# Patient Record
Sex: Female | Born: 1940
Health system: Southern US, Community
[De-identification: ages and names within clinical notes are randomized; demographics above are authoritative.]

## PROBLEM LIST (undated history)

## (undated) DIAGNOSIS — I4891 Unspecified atrial fibrillation: Secondary | ICD-10-CM

## (undated) DIAGNOSIS — T7840XA Allergy, unspecified, initial encounter: Secondary | ICD-10-CM

## (undated) DIAGNOSIS — C50919 Malignant neoplasm of unspecified site of unspecified female breast: Secondary | ICD-10-CM

## (undated) DIAGNOSIS — D126 Benign neoplasm of colon, unspecified: Secondary | ICD-10-CM

## (undated) DIAGNOSIS — K5790 Diverticulosis of intestine, part unspecified, without perforation or abscess without bleeding: Secondary | ICD-10-CM

## (undated) DIAGNOSIS — R05 Cough: Secondary | ICD-10-CM

## (undated) DIAGNOSIS — S42009A Fracture of unspecified part of unspecified clavicle, initial encounter for closed fracture: Secondary | ICD-10-CM

## (undated) DIAGNOSIS — I341 Nonrheumatic mitral (valve) prolapse: Secondary | ICD-10-CM

## (undated) DIAGNOSIS — E785 Hyperlipidemia, unspecified: Secondary | ICD-10-CM

## (undated) DIAGNOSIS — M810 Age-related osteoporosis without current pathological fracture: Secondary | ICD-10-CM

## (undated) DIAGNOSIS — G709 Myoneural disorder, unspecified: Secondary | ICD-10-CM

## (undated) DIAGNOSIS — R059 Cough, unspecified: Secondary | ICD-10-CM

## (undated) DIAGNOSIS — M8580 Other specified disorders of bone density and structure, unspecified site: Secondary | ICD-10-CM

## (undated) DIAGNOSIS — L659 Nonscarring hair loss, unspecified: Secondary | ICD-10-CM

## (undated) DIAGNOSIS — G479 Sleep disorder, unspecified: Secondary | ICD-10-CM

## (undated) DIAGNOSIS — D689 Coagulation defect, unspecified: Secondary | ICD-10-CM

## (undated) DIAGNOSIS — M1990 Unspecified osteoarthritis, unspecified site: Secondary | ICD-10-CM

## (undated) DIAGNOSIS — K219 Gastro-esophageal reflux disease without esophagitis: Secondary | ICD-10-CM

## (undated) DIAGNOSIS — I639 Cerebral infarction, unspecified: Secondary | ICD-10-CM

## (undated) DIAGNOSIS — J3089 Other allergic rhinitis: Secondary | ICD-10-CM

## (undated) HISTORY — DX: Age-related osteoporosis without current pathological fracture: M81.0

## (undated) HISTORY — DX: Cough: R05

## (undated) HISTORY — DX: Unspecified atrial fibrillation: I48.91

## (undated) HISTORY — PX: NASAL SEPTUM SURGERY: SHX37

## (undated) HISTORY — DX: Diverticulosis of intestine, part unspecified, without perforation or abscess without bleeding: K57.90

## (undated) HISTORY — DX: Other disorders of bilirubin metabolism: E80.6

## (undated) HISTORY — DX: Cerebral infarction, unspecified: I63.9

## (undated) HISTORY — DX: Fracture of unspecified part of unspecified clavicle, initial encounter for closed fracture: S42.009A

## (undated) HISTORY — DX: Myoneural disorder, unspecified: G70.9

## (undated) HISTORY — PX: JOINT REPLACEMENT: SHX530

## (undated) HISTORY — PX: UPPER GASTROINTESTINAL ENDOSCOPY: SHX188

## (undated) HISTORY — DX: Coagulation defect, unspecified: D68.9

## (undated) HISTORY — DX: Nonrheumatic mitral (valve) prolapse: I34.1

## (undated) HISTORY — DX: Benign neoplasm of colon, unspecified: D12.6

## (undated) HISTORY — DX: Other specified disorders of bone density and structure, unspecified site: M85.80

## (undated) HISTORY — DX: Cough, unspecified: R05.9

## (undated) HISTORY — DX: Gastro-esophageal reflux disease without esophagitis: K21.9

## (undated) HISTORY — DX: Nonscarring hair loss, unspecified: L65.9

## (undated) HISTORY — PX: COLONOSCOPY: SHX174

## (undated) HISTORY — DX: Unspecified osteoarthritis, unspecified site: M19.90

## (undated) HISTORY — DX: Allergy, unspecified, initial encounter: T78.40XA

## (undated) HISTORY — PX: TONSILLECTOMY: SUR1361

## (undated) HISTORY — DX: Hyperlipidemia, unspecified: E78.5

## (undated) HISTORY — PX: BREAST ENHANCEMENT SURGERY: SHX7

## (undated) HISTORY — DX: Malignant neoplasm of unspecified site of unspecified female breast: C50.919

## (undated) HISTORY — PX: BREAST CAPSULOTOMY: SHX1265

---

## 1972-03-15 HISTORY — PX: VEIN SURGERY: SHX48

## 1991-03-16 HISTORY — PX: ABDOMINAL HYSTERECTOMY: SHX81

## 2000-08-17 ENCOUNTER — Other Ambulatory Visit: Admission: RE | Admit: 2000-08-17 | Discharge: 2000-08-17 | Payer: Self-pay | Admitting: Internal Medicine

## 2001-02-27 ENCOUNTER — Other Ambulatory Visit: Admission: RE | Admit: 2001-02-27 | Discharge: 2001-02-27 | Payer: Self-pay | Admitting: Internal Medicine

## 2002-12-26 ENCOUNTER — Ambulatory Visit (HOSPITAL_COMMUNITY): Admission: RE | Admit: 2002-12-26 | Discharge: 2002-12-26 | Payer: Self-pay | Admitting: Internal Medicine

## 2003-08-14 DIAGNOSIS — D126 Benign neoplasm of colon, unspecified: Secondary | ICD-10-CM

## 2003-08-14 HISTORY — DX: Benign neoplasm of colon, unspecified: D12.6

## 2004-07-17 ENCOUNTER — Encounter: Admission: RE | Admit: 2004-07-17 | Discharge: 2004-07-17 | Payer: Self-pay | Admitting: Orthopedic Surgery

## 2004-09-22 ENCOUNTER — Ambulatory Visit: Payer: Self-pay | Admitting: Internal Medicine

## 2004-09-25 ENCOUNTER — Encounter: Admission: RE | Admit: 2004-09-25 | Discharge: 2004-09-25 | Payer: Self-pay | Admitting: Internal Medicine

## 2004-12-29 ENCOUNTER — Ambulatory Visit: Payer: Self-pay | Admitting: Internal Medicine

## 2006-10-28 DIAGNOSIS — J309 Allergic rhinitis, unspecified: Secondary | ICD-10-CM | POA: Insufficient documentation

## 2006-10-28 DIAGNOSIS — J45909 Unspecified asthma, uncomplicated: Secondary | ICD-10-CM | POA: Insufficient documentation

## 2006-10-28 DIAGNOSIS — F329 Major depressive disorder, single episode, unspecified: Secondary | ICD-10-CM

## 2006-12-02 ENCOUNTER — Other Ambulatory Visit: Admission: RE | Admit: 2006-12-02 | Discharge: 2006-12-02 | Payer: Self-pay | Admitting: Internal Medicine

## 2006-12-19 ENCOUNTER — Ambulatory Visit: Payer: Self-pay | Admitting: Gastroenterology

## 2007-01-04 ENCOUNTER — Encounter: Payer: Self-pay | Admitting: Internal Medicine

## 2007-01-04 ENCOUNTER — Ambulatory Visit: Payer: Self-pay | Admitting: Gastroenterology

## 2009-07-15 ENCOUNTER — Encounter: Admission: RE | Admit: 2009-07-15 | Discharge: 2009-07-15 | Payer: Self-pay | Admitting: Radiology

## 2009-07-23 ENCOUNTER — Ambulatory Visit: Payer: Self-pay | Admitting: Hematology & Oncology

## 2009-07-28 LAB — CBC WITH DIFFERENTIAL (CANCER CENTER ONLY)
BASO%: 0.7 % (ref 0.0–2.0)
EOS%: 1.8 % (ref 0.0–7.0)
Eosinophils Absolute: 0.1 10*3/uL (ref 0.0–0.5)
HGB: 13.4 g/dL (ref 11.6–15.9)
MONO#: 0.4 10*3/uL (ref 0.1–0.9)
NEUT%: 59.9 % (ref 39.6–80.0)
RBC: 4.2 10*6/uL (ref 3.70–5.32)

## 2009-07-29 LAB — COMPREHENSIVE METABOLIC PANEL
ALT: 19 U/L (ref 0–35)
AST: 18 U/L (ref 0–37)
Alkaline Phosphatase: 38 U/L — ABNORMAL LOW (ref 39–117)
Calcium: 10 mg/dL (ref 8.4–10.5)
Glucose, Bld: 90 mg/dL (ref 70–99)
Potassium: 4.7 mEq/L (ref 3.5–5.3)
Sodium: 139 mEq/L (ref 135–145)
Total Protein: 6.5 g/dL (ref 6.0–8.3)

## 2009-08-13 HISTORY — PX: BREAST SURGERY: SHX581

## 2009-08-15 ENCOUNTER — Encounter: Admission: RE | Admit: 2009-08-15 | Discharge: 2009-08-15 | Payer: Self-pay | Admitting: General Surgery

## 2009-08-19 ENCOUNTER — Ambulatory Visit (HOSPITAL_BASED_OUTPATIENT_CLINIC_OR_DEPARTMENT_OTHER): Admission: RE | Admit: 2009-08-19 | Discharge: 2009-08-19 | Payer: Self-pay | Admitting: General Surgery

## 2009-08-25 ENCOUNTER — Ambulatory Visit: Payer: Self-pay | Admitting: Hematology & Oncology

## 2009-10-09 ENCOUNTER — Ambulatory Visit (HOSPITAL_COMMUNITY): Admission: RE | Admit: 2009-10-09 | Discharge: 2009-10-10 | Payer: Self-pay | Admitting: General Surgery

## 2009-10-24 ENCOUNTER — Ambulatory Visit: Payer: Self-pay | Admitting: Hematology & Oncology

## 2009-11-13 HISTORY — PX: OTHER SURGICAL HISTORY: SHX169

## 2009-11-28 ENCOUNTER — Ambulatory Visit (HOSPITAL_COMMUNITY): Admission: RE | Admit: 2009-11-28 | Discharge: 2009-11-28 | Payer: Self-pay | Admitting: Orthopedic Surgery

## 2010-05-28 LAB — URINALYSIS, ROUTINE W REFLEX MICROSCOPIC
Protein, ur: NEGATIVE mg/dL
Specific Gravity, Urine: 1.011 (ref 1.005–1.030)

## 2010-05-28 LAB — DIFFERENTIAL
Basophils Absolute: 0 10*3/uL (ref 0.0–0.1)
Eosinophils Absolute: 0.1 10*3/uL (ref 0.0–0.7)
Eosinophils Relative: 1 % (ref 0–5)
Lymphocytes Relative: 28 % (ref 12–46)
Monocytes Absolute: 0.5 10*3/uL (ref 0.1–1.0)
Neutrophils Relative %: 62 % (ref 43–77)

## 2010-05-28 LAB — BASIC METABOLIC PANEL
BUN: 13 mg/dL (ref 6–23)
Creatinine, Ser: 0.78 mg/dL (ref 0.4–1.2)
GFR calc Af Amer: >60 mL/min (ref >60)
Potassium: 3.9 mEq/L (ref 3.5–5.1)

## 2010-05-28 LAB — CBC
Hemoglobin: 13.9 g/dL (ref 12.0–15.0)
MCH: 31.4 pg (ref 26.0–34.0)
Platelets: 264 10*3/uL (ref 150–400)
RDW: 13 % (ref 11.5–15.5)

## 2010-05-28 LAB — URINE MICROSCOPIC-ADD ON

## 2010-05-28 LAB — APTT: aPTT: 27 seconds (ref 24–37)

## 2010-05-30 LAB — BASIC METABOLIC PANEL
BUN: 11 mg/dL (ref 6–23)
Chloride: 106 mEq/L (ref 96–112)
Creatinine, Ser: 0.77 mg/dL (ref 0.4–1.2)
GFR calc non Af Amer: >60 mL/min (ref >60)
Glucose, Bld: 102 mg/dL — ABNORMAL HIGH (ref 70–99)
Sodium: 142 mEq/L (ref 135–145)

## 2010-05-30 LAB — CBC
MCH: 32 pg (ref 26.0–34.0)
MCHC: 33.1 g/dL (ref 30.0–36.0)
MCV: 96.8 fL (ref 78.0–100.0)
Platelets: 249 10*3/uL (ref 150–400)
RBC: 4.45 MIL/uL (ref 3.87–5.11)
RDW: 13.3 % (ref 11.5–15.5)
WBC: 6.6 10*3/uL (ref 4.0–10.5)

## 2010-05-30 LAB — TYPE AND SCREEN
ABO/RH(D): A POS
Antibody Screen: NEGATIVE

## 2010-05-30 LAB — DIFFERENTIAL: Monocytes Absolute: 0.3 10*3/uL (ref 0.1–1.0)

## 2010-05-30 LAB — SURGICAL PCR SCREEN: MRSA, PCR: POSITIVE — AB

## 2010-05-30 LAB — ABO/RH: ABO/RH(D): A POS

## 2010-06-01 LAB — COMPREHENSIVE METABOLIC PANEL
ALT: 16 U/L (ref 0–35)
AST: 19 U/L (ref 0–37)
Albumin: 4.2 g/dL (ref 3.5–5.2)
BUN: 20 mg/dL (ref 6–23)
Calcium: 10 mg/dL (ref 8.4–10.5)
Chloride: 106 mEq/L (ref 96–112)
Creatinine, Ser: 0.81 mg/dL (ref 0.4–1.2)
Glucose, Bld: 96 mg/dL (ref 70–99)
Potassium: 4.9 mEq/L (ref 3.5–5.1)
Total Bilirubin: 1.2 mg/dL (ref 0.3–1.2)

## 2010-06-01 LAB — CBC
Platelets: 220 10*3/uL (ref 150–400)
RBC: 4.4 MIL/uL (ref 3.87–5.11)

## 2010-06-01 LAB — POCT HEMOGLOBIN-HEMACUE: Hemoglobin: 14.2 g/dL (ref 12.0–15.0)

## 2010-06-01 LAB — DIFFERENTIAL
Basophils Absolute: 0 10*3/uL (ref 0.0–0.1)
Eosinophils Relative: 2 % (ref 0–5)
Monocytes Absolute: 0.3 10*3/uL (ref 0.1–1.0)

## 2010-07-14 HISTORY — PX: CHEST WALL TUMOR EXCISION: SUR562

## 2010-07-20 ENCOUNTER — Encounter (HOSPITAL_BASED_OUTPATIENT_CLINIC_OR_DEPARTMENT_OTHER)
Admission: RE | Admit: 2010-07-20 | Discharge: 2010-07-20 | Disposition: A | Discharge status: Home / Self Care | Payer: Medicare Other | Source: Ambulatory Visit | Attending: General Surgery | Admitting: General Surgery

## 2010-07-20 LAB — COMPREHENSIVE METABOLIC PANEL
ALT: 17 U/L (ref 0–35)
Albumin: 3.8 g/dL (ref 3.5–5.2)
Alkaline Phosphatase: 47 U/L (ref 39–117)
BUN: 19 mg/dL (ref 6–23)
Chloride: 104 mEq/L (ref 96–112)
Glucose, Bld: 134 mg/dL — ABNORMAL HIGH (ref 70–99)
Potassium: 3.9 mEq/L (ref 3.5–5.1)
Sodium: 139 mEq/L (ref 135–145)
Total Bilirubin: 0.9 mg/dL (ref 0.3–1.2)
Total Protein: 6.6 g/dL (ref 6.0–8.3)

## 2010-07-20 LAB — CBC
HCT: 41.8 % (ref 36.0–46.0)
MCV: 94.1 fL (ref 78.0–100.0)
RBC: 4.44 MIL/uL (ref 3.87–5.11)
RDW: 13.2 % (ref 11.5–15.5)
WBC: 6.9 10*3/uL (ref 4.0–10.5)

## 2010-07-20 LAB — DIFFERENTIAL
Basophils Absolute: 0 10*3/uL (ref 0.0–0.1)
Lymphocytes Relative: 25 % (ref 12–46)
Lymphs Abs: 1.7 10*3/uL (ref 0.7–4.0)
Neutro Abs: 4.5 10*3/uL (ref 1.7–7.7)
Neutrophils Relative %: 65 % (ref 43–77)

## 2010-07-24 ENCOUNTER — Ambulatory Visit (HOSPITAL_BASED_OUTPATIENT_CLINIC_OR_DEPARTMENT_OTHER)
Admission: RE | Admit: 2010-07-24 | Discharge: 2010-07-24 | Disposition: A | Discharge status: Home / Self Care | Payer: Medicare Other | Source: Ambulatory Visit | Attending: General Surgery | Admitting: General Surgery

## 2010-07-24 ENCOUNTER — Other Ambulatory Visit: Payer: Self-pay | Admitting: General Surgery

## 2010-07-24 DIAGNOSIS — Z01812 Encounter for preprocedural laboratory examination: Secondary | ICD-10-CM | POA: Insufficient documentation

## 2010-07-24 DIAGNOSIS — Z853 Personal history of malignant neoplasm of breast: Secondary | ICD-10-CM | POA: Insufficient documentation

## 2010-07-24 DIAGNOSIS — R229 Localized swelling, mass and lump, unspecified: Secondary | ICD-10-CM | POA: Insufficient documentation

## 2010-07-24 DIAGNOSIS — Z9010 Acquired absence of unspecified breast and nipple: Secondary | ICD-10-CM | POA: Insufficient documentation

## 2010-07-29 NOTE — Op Note (Signed)
  NAME:  Felicia Hunter NO.:  [id number]  MEDICAL RECORD NO.:  [id number]            PATIENT TYPE:  LOCATION:                                 FACILITY:  PHYSICIAN:  Adolph Pollack, M.D.DATE OF BIRTH:  08-27-1940  DATE OF PROCEDURE:  07/24/2010 DATE OF DISCHARGE:                              OPERATIVE REPORT   PREOPERATIVE DIAGNOSIS:  Right chest wall nodule following mastectomy for right breast cancer, October 01, 2009.  POSTOPERATIVE DIAGNOSIS:  Two right chest wall nodules.  PROCEDURE:  Excision of two right chest wall nodules.  SURGEON:  Adolph Pollack, MD.  ANESTHESIA:  Xylocaine local with monitored anesthetic care.  INDICATION:  This 70 year old female had bilateral mastectomies, left side being prophylactic, right side being for right breast cancer.  When I saw her on July 08, 2010, I found a 1.5 cm mobile chest wall nodule superior to the incision and I suggested to do an excisional biopsy. Today after being placed supine on the table, I found a second nodule measuring about 1.1 cm adjacent to this.  The plan was for excision of the chest wall nodule.  Procedure risks were discussed with her preoperatively.  TECHNIQUE:  She was seen in the holding area.  Right chest wall was marked with my initials.  She was brought to the operating room, placed supine on the operating table, given intravenous sedation.  I then noted two areas when I examined her under sedation, marked these with a marking pen and sterilely prepped and draped the right chest wall.  Local anesthetic was infiltrated over the larger two areas, which was medial.  I then made about a 2-3 cm incision through the skin, subcutaneous tissue, and palpated the nodule which was very firm.  Using sharp dissection, I excised this and began pulling medially and included the other nodule and this excision as well using sharp dissection.  Once I removed both nodules, they both fairly firm.   I separated them, but did not send them to pathology for permanent inspection.  I then inspected the area and no other nodules were felt.  I anesthetized the area with more of Xylocaine.  I then controlled the bleeding down from the chest wall and subcutaneous tissue with electrocautery.  Once hemostasis was adequate, I closed the chest wall in three layers. I reapproximated the muscle with running 3-0 Monocryl suture.  I reapproximated the subcutaneous tissue with a running 3-0 Monocryl suture.  I then closed the skin with a running 3-0 Monocryl suture. Steri-Strips and sterile dressing were applied.  She tolerated the procedure without any apparent complications, and was taken to recovery room in satisfactory condition.     Adolph Pollack, M.D.     Kari Baars  D:  07/24/2010  T:  07/24/2010  Job:  [id number]  cc:   Soyla Murphy. [name], M.D. Josph Macho, M.D. Wardell Heath, MD Billie Lade, Ph.D., M.D. Yaakov Guthrie. Shon Hough, M.D.  Electronically Signed by Avel Peace M.D. on 07/29/2010 07:53:31 AM

## 2010-09-13 HISTORY — PX: KNEE RECONSTRUCTION: SHX5883

## 2010-09-15 ENCOUNTER — Other Ambulatory Visit: Payer: Self-pay | Admitting: Orthopedic Surgery

## 2010-09-15 ENCOUNTER — Encounter (HOSPITAL_COMMUNITY): Payer: Medicare Other

## 2010-09-15 LAB — URINALYSIS, ROUTINE W REFLEX MICROSCOPIC
Bilirubin Urine: NEGATIVE
Glucose, UA: NEGATIVE mg/dL
Hgb urine dipstick: NEGATIVE
Ketones, ur: NEGATIVE mg/dL
Protein, ur: NEGATIVE mg/dL
Urobilinogen, UA: 0.2 mg/dL (ref 0.0–1.0)

## 2010-09-15 LAB — BASIC METABOLIC PANEL
BUN: 19 mg/dL (ref 6–23)
CO2: 29 mEq/L (ref 19–32)
Calcium: 10.5 mg/dL (ref 8.4–10.5)
Creatinine, Ser: 0.76 mg/dL (ref 0.50–1.10)
Glucose, Bld: 79 mg/dL (ref 70–99)

## 2010-09-15 LAB — DIFFERENTIAL
Basophils Absolute: 0 10*3/uL (ref 0.0–0.1)
Basophils Relative: 1 % (ref 0–1)
Eosinophils Absolute: 0.1 10*3/uL (ref 0.0–0.7)
Monocytes Absolute: 0.5 10*3/uL (ref 0.1–1.0)
Monocytes Relative: 8 % (ref 3–12)
Neutro Abs: 4.3 10*3/uL (ref 1.7–7.7)
Neutrophils Relative %: 63 % (ref 43–77)

## 2010-09-15 LAB — CBC
MCH: 31.2 pg (ref 26.0–34.0)
MCHC: 32.8 g/dL (ref 30.0–36.0)
Platelets: 258 10*3/uL (ref 150–400)

## 2010-09-15 LAB — PROTIME-INR
INR: 1 (ref 0.00–1.49)
Prothrombin Time: 13.4 seconds (ref 11.6–15.2)

## 2010-09-15 LAB — SURGICAL PCR SCREEN: Staphylococcus aureus: NEGATIVE

## 2010-09-21 ENCOUNTER — Inpatient Hospital Stay (HOSPITAL_COMMUNITY)
Admission: RE | Admit: 2010-09-21 | Discharge: 2010-09-23 | DRG: 468 | Disposition: A | Discharge status: Home Health | Payer: Medicare Other | Source: Ambulatory Visit | Attending: Orthopedic Surgery | Admitting: Orthopedic Surgery

## 2010-09-21 DIAGNOSIS — Z853 Personal history of malignant neoplasm of breast: Secondary | ICD-10-CM

## 2010-09-21 DIAGNOSIS — M1710 Unilateral primary osteoarthritis, unspecified knee: Secondary | ICD-10-CM | POA: Diagnosis present

## 2010-09-21 DIAGNOSIS — Z01812 Encounter for preprocedural laboratory examination: Secondary | ICD-10-CM

## 2010-09-21 DIAGNOSIS — T84099A Other mechanical complication of unspecified internal joint prosthesis, initial encounter: Principal | ICD-10-CM | POA: Diagnosis present

## 2010-09-21 DIAGNOSIS — I059 Rheumatic mitral valve disease, unspecified: Secondary | ICD-10-CM | POA: Diagnosis present

## 2010-09-21 DIAGNOSIS — Y831 Surgical operation with implant of artificial internal device as the cause of abnormal reaction of the patient, or of later complication, without mention of misadventure at the time of the procedure: Secondary | ICD-10-CM | POA: Diagnosis present

## 2010-09-21 DIAGNOSIS — Z96659 Presence of unspecified artificial knee joint: Secondary | ICD-10-CM

## 2010-09-21 LAB — TYPE AND SCREEN

## 2010-09-21 LAB — ABO/RH: ABO/RH(D): A POS

## 2010-09-21 NOTE — H&P (Signed)
NAMEJARELIS, Felicia Hunter                ACCOUNT NO.:  [id number]  MEDICAL RECORD NO.:  [id number]  LOCATION:                                 FACILITY:  PHYSICIAN:  Madlyn Frankel. Charlann Boxer, M.D.  DATE OF BIRTH:  Aug 11, 1940  DATE OF ADMISSION: DATE OF DISCHARGE:                             HISTORY & PHYSICAL   ADMISSION DIAGNOSIS:  Left knee unicompartmental arthroplasty with pain.  HISTORY OF PRESENT ILLNESS:  This is 70 year old lady with a history of unicompartmental left knee arthroplasty 4 years ago in [place] who has had increasing pain and discomfort in the left knee with loosening.  At this time, the patient is now scheduled for conversion of unicompartmental knee to total knee arthroplasty.  The surgery risks, benefits, and aftercare were discussed with the patient.  Questions invited and answered.  Note, she is not a candidate for tranexamic acid and will not receive that in preop.  She is given her postop medications including aspirin, Robaxin, iron, MiraLax, and Colace to take postoperatively.  Also note that her medical doctor is Dr. Merri Brunette, she will be going home after surgery.  PAST MEDICAL HISTORY:  Drug allergy to SULFA with a rash.  CURRENT MEDICATIONS: 1. Ambien 10 mg one bedtime. 2. Multivitamins. 3. Calcium with vitamin D. 4. Osteo Bi-Flex. 5. Fish oil and vitamin C.  MEDICAL ILLNESSES:  Include history of breast cancer and mitral valve prolapse.  PREVIOUS SURGERIES:  Include deviated septum, vein stripping, breast implants, hysterectomy, partial knee arthroplasty, lumpectomy, double mastectomy, and ORIF collar of a clavicle fracture.  FAMILY HISTORY:  Positive for aneurysm and heart disease.  SOCIAL HISTORY:  The patient is widowed.  She has a BS in education. She does not smoke and drinks 1 glass of wine per day.  REVIEW OF SYSTEMS:  CENTRAL NERVOUS SYSTEM:  Negative for headache, blurred vision, or dizziness. PULMONARY:  Negative for shortness of  breath, PND, and orthopnea. CARDIOVASCULAR:  Negative for chest pain or palpitation. GI:  Negative for ulcers or hepatitis. GU:  Negative for urinary tract difficulty. MUSCULOSKELETAL:  Positive in HPI.  PHYSICAL EXAMINATION:  GENERAL:  Reveals well-developed, well-nourished lady in no acute distress. VITAL SIGNS:  BP 110/70, respirations 12, pulse 68 and regular. HEENT:  Head normocephalic.  Nose patent.  Pupils equal, round, and reactive to light.  Throat without injection. NECK:  Supple without adenopathy.  Carotids 2+ without bruit. CHEST:  Clear to auscultation.  No rales or rhonchi.  Respirations 12. HEART:  Regular rate and rhythm at 68 beats per minute without murmur. ABDOMEN:  Soft with active bowel sounds.  No masses, organomegaly. NEUROLOGIC:  The patient alert and oriented to time, place, and person. Cranial nerves II-XII grossly intact. EXTREMITIES:  Shows left knee with a 5-degree flexion contracture with further flexion to 120 degrees. NEUROVASCULAR STATUS:  Intact.  IMPRESSION:  Left knee, failed unicompartmental arthroplasty.  PLAN:  Left total knee arthroplasty revision.     Jaquelyn Bitter. Chabon, P.A.   ______________________________ Madlyn Frankel Charlann Boxer, M.D.    SJC/MEDQ  D:  09/09/2010  T:  09/10/2010  Job:  [id number]  Electronically Signed by Jodene Nam P.A. on 09/20/2010 04:53:07  PM Electronically Signed by Durene Romans M.D. on 09/21/2010 09:15:55 AM

## 2010-09-22 LAB — BASIC METABOLIC PANEL
Chloride: 98 mEq/L (ref 96–112)
GFR calc Af Amer: >60 mL/min (ref >60)
GFR calc non Af Amer: >60 mL/min (ref >60)
Glucose, Bld: 98 mg/dL (ref 70–99)
Potassium: 3.7 mEq/L (ref 3.5–5.1)
Sodium: 129 mEq/L — ABNORMAL LOW (ref 135–145)

## 2010-09-22 LAB — CBC
Hemoglobin: 11.1 g/dL — ABNORMAL LOW (ref 12.0–15.0)
MCHC: 32.6 g/dL (ref 30.0–36.0)
RBC: 3.54 MIL/uL — ABNORMAL LOW (ref 3.87–5.11)
WBC: 6.8 10*3/uL (ref 4.0–10.5)

## 2010-09-22 NOTE — Op Note (Signed)
NAMECERITA, RABELO NO.:  [id number]  MEDICAL RECORD NO.:  [id number]  LOCATION:  0007                         FACILITY:  [facility]  PHYSICIAN:  Madlyn Frankel. Charlann Boxer, M.D.  DATE OF BIRTH:  May 18, 1940  DATE OF PROCEDURE:  09/21/2010 DATE OF DISCHARGE:                              OPERATIVE REPORT   PREOPERATIVE DIAGNOSIS:  Failed left partial knee replacement due to patellofemoral degenerative changes and progressive arthritic changes.  POSTOPERATIVE DIAGNOSIS:  Failed left partial knee replacement due to patellofemoral degenerative changes and progressive arthritic changes.  PROCEDURE:  Revision left total knee replacement, conversion from partial to total knee replacement, utilizing DePuy component, size 2.5 femoral component, 2.5 tibia tray with 15-mm insert and 38 patellar button.  SURGEON:  Madlyn Frankel. Charlann Boxer, M.D.  ASSISTANT:  Jaquelyn Bitter. Chabon, P.A.  ANESTHESIA:  Spinal.  SPECIMENS:  None.  COMPLICATIONS:  None.  DRAINS:  One Hemovac.  TOURNIQUET TIME:  44 minutes at 250 mmHg.  BLOOD LOSS:  About 100 mL.  INDICATIONS FOR PROCEDURE:  [name] is a very pleasant 70 year old female with history of left partial knee replacement performed about 6 years ago.  She had been noted to have some progressive degenerative changes over the past couple of years but was failing conservative measures.  At this point, she was ready for definitive measures in terms of management of symptoms.  Workup was negative for infection.  Risks of potential persistent problems after knee replacement, need for revision surgery for any reason, the postop course expectations, standard risk of infection, DVT, discussed and reviewed.  Consent was obtained for benefit of pain relief.  PROCEDURE IN DETAIL:  The patient was brought to operative theater. Once adequate anesthesia, preoperative antibiotics, Ancef administered, she was positioned supine with the left thigh tourniquet  placed.  Left lower extremity was then prepped and draped in a sterile fashion.  Time- out was performed, identifying the patient, planned procedure and extremity.  The patient had a previous incision over the paramidline area.  This was extended distally and proximally, allowed for exposure of the knee. Soft tissue planes created.  The median arthrotomy was then made.  No signs of infection clinically, following initial exposure and debridement.  Attention was directed to patella.  Precut measurement was 22 mm, dissected down to 14 mm using a 38 patellar button to restore patellar height.  Lug holes were drilled and a metal shim placed to protect the patella from retractors and saw blades.  Attention was now directed to the femur.  With the patient's previously placed partial components retained, femoral canal was opened and drilled, irrigated to try to prevent fat emboli.  An intramedullary rod was passed at 3 degrees of valgus, I resected initially 10 mm bone off the distal femur.  Following this resection particularly with very sclerotic lateral bone, I was able to move the femoral component using osteotome fairly easily. I finished up the cut on the distal medial femoral condyle.  The cut was otherwise flush.  At this point, the tibia was subluxated anteriorly.  Using extramedullary guide, measured resection of 10 mm was initially planned. However, this cut appeared to be thicker on  the lateral side than anticipated and saw it back down to an 8 mm cut, measured up to proximal lateral tibia.  Following this resection, this was just underneath the tibia tray.  There was no significant defect other than trough.  The tibial component was removed without difficulty, almost in total with the resected the proximal tibia.  At this point when I checked the extension gap, I found that the gap was a little tight in extension with 10-mm insert.  I also found that following removal of  these cuts thus far that the flexion gap felt a little bit on loose side.  I went ahead at this point and decided to remove for two more millimeters of bone off the distal femur to match the gaps.  The guide was repositioned 2 mm of bone which was then resected off the distal femur.  At this point, I sized the femur to be a size 2.5.  The 2.5 rotation block was then pinned into position on the distal femur using the C- clamp to set rotation off the proximal tibial cut which was confirmed to be perpendicular in the coronal plane.  The final box cut was made up the lateral aspect of distal femur.  There was no need for any augments on the distal medial femur or the posterior medial femur.  At this point, the tibia was subluxated again anteriorly.  Cut surface was best fit with size 2.5 tibial tray, it was pinned in position, drilled and keel punched.  Trial reduction was 2.5 femur and 2.5 tibia. Despite these conservative cuts based on that flexion gap, balancing the 15-mm insert was the best position for this.  The knee came to full extension.  The medial and lateral collateral ligaments felt nice and stable and the patella tracked through the trochlea without application pressure.  Given all these findings, the trial components removed. Sclerotic bone was drilled off the distal lateral femur, as it was very dense.  I removed posterior, medial and lateral osteophytes.  The knee was irrigated with normal saline solution and pulse lavage.  Final components were opened and cement mixed.  The synovial capsule junction, knee was injected with 0.25% Marcaine with epinephrine and 1 mL of Toradol.  The knee was irrigated with normal saline solution and pulse lavage.  Final components were cemented on clean and dried cut surfaces of bone.  15-mm trial was inserted and extruded cement was removed.  Once the cement had fully cured, after the knee was held in extension, the remaining cement was  removed throughout the knee.  Once all excessive cement was removed, the knee was re-irrigated and final 15-mm insert was chosen and inserted.  The tourniquet had been let down at 44 minutes without significant hemostasis required.  A medium Hemovac drain was placed deep.  We irrigated the knee again with normal saline solution and pulse lavage.  Extensor mechanism was then reapproximated with the knee and flexion using #1 Vicryl.  The remainder of wound was closed with 2-0 Vicryl and running 4-0 Monocryl.  The knee was cleaned, dried and dressed sterilely using Dermabond, Aquacel dressing.  She was then brought to recovery room in stable condition, tolerating the procedure well.     Madlyn Frankel Charlann Boxer, M.D.     MDO/MEDQ  D:  09/21/2010  T:  09/21/2010  Job:  [id number]  Electronically Signed by Durene Romans M.D. on 09/22/2010 06:18:06 PM

## 2010-09-23 LAB — CBC
Hemoglobin: 11.4 g/dL — ABNORMAL LOW (ref 12.0–15.0)
MCH: 31.2 pg (ref 26.0–34.0)
RBC: 3.65 MIL/uL — ABNORMAL LOW (ref 3.87–5.11)

## 2010-09-23 LAB — BASIC METABOLIC PANEL
CO2: 24 mEq/L (ref 19–32)
Glucose, Bld: 111 mg/dL — ABNORMAL HIGH (ref 70–99)
Potassium: 3.6 mEq/L (ref 3.5–5.1)
Sodium: 136 mEq/L (ref 135–145)

## 2010-09-28 NOTE — Discharge Summary (Signed)
NAMEMYA, SUELL NO.:  [id number]  MEDICAL RECORD NO.:  [id number]  LOCATION:  1611                         FACILITY:  [facility] [place]  PHYSICIAN:  Madlyn Frankel. Charlann Boxer, M.D.  DATE OF BIRTH:  [birth year]  DATE OF ADMISSION:  09/21/2010 DATE OF DISCHARGE:  09/23/2010                              DISCHARGE SUMMARY   ADMITTING DIAGNOSIS:  Failed left partial knee replacement.  DISCHARGE DIAGNOSES: 1. Failed left partial knee replacement status post left total knee     revision on September 21, 2010. 2. History of breast cancer. 3. Mitral valve prolapse.  ADMITTING HISTORY:  Ms. Offer is a 70 year old female with a history of a left partial knee replacement within the last 5 years done in [place]. She had been seen and evaluated with some progressive discomfort with radiographic evidence of degenerative changes progressive in the lateral patellofemoral components.  She had failed to respond to conservative measures.  After reviewing with her her options, she wished to improve her quality of life by proceeding with left knee revision.  The risks of infection, DVT, component failure, need for revision surgery were discussed and reviewed.  The postoperative course and expectations discussed.  HOSPITAL COURSE:  The patient admitted for same-day surgery on September 21, 2010.  She underwent a left total knee revision.  Please see dictated operative note for full details of the procedure. Postoperatively after routine stay in the recovery room, she was taken to the orthopedic ward where she remained for her 2-day hospital stay. She was seen and evaluated by Physical Therapy.  Her Foley catheter and Hemovac drain were removed on postop day #1.  She was placed on regular diet and tolerated it well.  Her labs remained stable in the hospital with normal electrolytes.  She did have a slight drop in her sodium on day #1 to 129 but on day #2 it was back up to 136.  Her hematocrit was 34.8 on  day #2 and 34.0 on day #1.  Her dressing was taken down on day #2, her dressing was dry.  She has an Aquacel dressing in place.  DISCHARGE CONDITION:  Stable at the time of discharge.  DISCHARGE INSTRUCTIONS:  The patient will be discharged to home with home health physical therapy to work on range of motion, strengthening, and gait training.  She may shower immediately.  She currently has an Aquacel dressing in place and will remove this in 7 or 8 days and cover with gauze and tape. She will return to see Dr. Durene Romans at [facility] at [phone] in 2 weeks' time, appointment already set.  Any questions can be addressed through our office.  DISCHARGE MEDICATIONS:  Her discharge medications include: 1. Colace 100 mg p.o. b.i.d. as needed for constipation while on pain     medicine. 2. MiraLax 17 g p.o. once to twice a day as needed for constipation     while on pain medicine. 3. Senokot-S tablet q.h.s. as needed for constipation. 4. Iron 325 mg p.o. b.i.d. for 1 week only. 5. Robaxin 500 mg every 6 hours as needed for muscle spasm, pain. 6. Oxycodone 5 to 15 mg every  4 to 6 hours as needed for pain. 7. Aleve as needed. 8. Ambien 10 mg q.h.s. as needed. 9. Artificial tears as needed. 10.Biotin tablet p.o. daily. 11.Calcium b.i.d. 12.Fish oil b.i.d. 13.Multivitamins daily. 14.Osteo Bi-Flex as needed. 15.Vitamin D3 2000 units daily.     Madlyn Frankel Charlann Boxer, M.D.     MDO/MEDQ  D:  09/23/2010  T:  09/23/2010  Job:  [id number]  Electronically Signed by Durene Romans M.D. on 09/28/2010 09:39:12 AM

## 2010-10-28 ENCOUNTER — Encounter: Payer: Medicare Other | Admitting: Hematology & Oncology

## 2010-11-25 ENCOUNTER — Ambulatory Visit (INDEPENDENT_AMBULATORY_CARE_PROVIDER_SITE_OTHER): Payer: Medicare Other | Admitting: General Surgery

## 2010-11-25 ENCOUNTER — Encounter (INDEPENDENT_AMBULATORY_CARE_PROVIDER_SITE_OTHER): Payer: Self-pay | Admitting: General Surgery

## 2010-11-25 VITALS — BP 122/86 | HR 56

## 2010-11-25 DIAGNOSIS — C50911 Malignant neoplasm of unspecified site of right female breast: Secondary | ICD-10-CM

## 2010-11-25 DIAGNOSIS — C50919 Malignant neoplasm of unspecified site of unspecified female breast: Secondary | ICD-10-CM

## 2010-11-25 NOTE — Progress Notes (Signed)
Operation:  Bilateral mastectomies for DCIS  Date:  10/09/2009  Stage: Tis  Hormone receptor status: positive  HPI:  Felicia Hunter is here for long-term followup of her right breast DCIS. She denies any chest wall nodules. She denies any axillary or cervical adenopathy.  PE:  Felicia Hunter looks well and in no acute distress.  Chest-bilateral chest wall scars are present; no nodules are palpable. No suspicious skin changes are noted.  Assessment:  Ductal carcinoma in situ of right breast status post bilateral mastectomies-no evidence of recurrence clinically  Plan:  Return visit in 3 months.

## 2011-02-12 ENCOUNTER — Telehealth: Payer: Self-pay | Admitting: *Deleted

## 2011-02-12 NOTE — Telephone Encounter (Signed)
Changed 8-15 to 10-27-11 mailed schedule to pt

## 2011-02-24 ENCOUNTER — Encounter (INDEPENDENT_AMBULATORY_CARE_PROVIDER_SITE_OTHER): Payer: Self-pay | Admitting: General Surgery

## 2011-02-24 ENCOUNTER — Ambulatory Visit (INDEPENDENT_AMBULATORY_CARE_PROVIDER_SITE_OTHER): Payer: Medicare Other | Admitting: General Surgery

## 2011-02-24 VITALS — BP 154/92 | HR 68 | Temp 97.8°F | Resp 16 | Ht 65.5 in | Wt 136.0 lb

## 2011-02-24 DIAGNOSIS — Z853 Personal history of malignant neoplasm of breast: Secondary | ICD-10-CM | POA: Insufficient documentation

## 2011-02-24 NOTE — Patient Instructions (Signed)
Call if you feel any new chest wall nodules. 

## 2011-02-24 NOTE — Progress Notes (Signed)
Operation:  Bilateral mastectomies for DCIS  Date:  10/09/2009  Stage: Tis  Hormone receptor status: positive  HPI:  Felicia Hunter is here for long-term followup of her right breast DCIS. She denies any chest wall nodules. She denies any axillary or cervical adenopathy.  She recently returned from [place]  PE:  Felicia Hunter looks well and in no acute distress.  Chest-bilateral chest wall scars are present; no nodules are palpable. No suspicious skin changes are noted.  Nodes- no axillary, supraclavicular, or cervical adenopathy  Assessment:  Ductal carcinoma in situ of right breast status post bilateral mastectomies-no evidence of recurrence clinically  Plan:  Return visit in 3 months.

## 2011-05-24 ENCOUNTER — Encounter (INDEPENDENT_AMBULATORY_CARE_PROVIDER_SITE_OTHER): Payer: Self-pay | Admitting: General Surgery

## 2011-06-07 ENCOUNTER — Encounter (HOSPITAL_COMMUNITY): Payer: Self-pay | Admitting: Emergency Medicine

## 2011-06-07 ENCOUNTER — Emergency Department (HOSPITAL_COMMUNITY): Payer: Medicare Other

## 2011-06-07 ENCOUNTER — Inpatient Hospital Stay (HOSPITAL_COMMUNITY)
Admission: EM | Admit: 2011-06-07 | Discharge: 2011-06-09 | DRG: 066 | Disposition: A | Discharge status: Rehabilitation Facility | Payer: Medicare Other | Source: Ambulatory Visit | Attending: Internal Medicine | Admitting: Internal Medicine

## 2011-06-07 ENCOUNTER — Other Ambulatory Visit: Payer: Self-pay

## 2011-06-07 DIAGNOSIS — I6350 Cerebral infarction due to unspecified occlusion or stenosis of unspecified cerebral artery: Secondary | ICD-10-CM | POA: Diagnosis not present

## 2011-06-07 DIAGNOSIS — IMO0002 Reserved for concepts with insufficient information to code with codable children: Secondary | ICD-10-CM

## 2011-06-07 DIAGNOSIS — R404 Transient alteration of awareness: Secondary | ICD-10-CM | POA: Diagnosis not present

## 2011-06-07 DIAGNOSIS — K219 Gastro-esophageal reflux disease without esophagitis: Secondary | ICD-10-CM | POA: Diagnosis present

## 2011-06-07 DIAGNOSIS — R7989 Other specified abnormal findings of blood chemistry: Secondary | ICD-10-CM | POA: Diagnosis not present

## 2011-06-07 DIAGNOSIS — Z96659 Presence of unspecified artificial knee joint: Secondary | ICD-10-CM

## 2011-06-07 DIAGNOSIS — R42 Dizziness and giddiness: Secondary | ICD-10-CM | POA: Diagnosis not present

## 2011-06-07 DIAGNOSIS — R55 Syncope and collapse: Secondary | ICD-10-CM | POA: Diagnosis not present

## 2011-06-07 DIAGNOSIS — I639 Cerebral infarction, unspecified: Secondary | ICD-10-CM

## 2011-06-07 DIAGNOSIS — Z882 Allergy status to sulfonamides status: Secondary | ICD-10-CM

## 2011-06-07 DIAGNOSIS — J45909 Unspecified asthma, uncomplicated: Secondary | ICD-10-CM | POA: Diagnosis present

## 2011-06-07 DIAGNOSIS — F329 Major depressive disorder, single episode, unspecified: Secondary | ICD-10-CM | POA: Diagnosis not present

## 2011-06-07 DIAGNOSIS — Z7982 Long term (current) use of aspirin: Secondary | ICD-10-CM

## 2011-06-07 DIAGNOSIS — F3289 Other specified depressive episodes: Secondary | ICD-10-CM | POA: Diagnosis present

## 2011-06-07 DIAGNOSIS — R918 Other nonspecific abnormal finding of lung field: Secondary | ICD-10-CM | POA: Diagnosis not present

## 2011-06-07 DIAGNOSIS — R7309 Other abnormal glucose: Secondary | ICD-10-CM | POA: Diagnosis present

## 2011-06-07 DIAGNOSIS — Z888 Allergy status to other drugs, medicaments and biological substances status: Secondary | ICD-10-CM

## 2011-06-07 DIAGNOSIS — Z853 Personal history of malignant neoplasm of breast: Secondary | ICD-10-CM

## 2011-06-07 DIAGNOSIS — R112 Nausea with vomiting, unspecified: Secondary | ICD-10-CM | POA: Diagnosis not present

## 2011-06-07 DIAGNOSIS — E785 Hyperlipidemia, unspecified: Secondary | ICD-10-CM | POA: Diagnosis present

## 2011-06-07 DIAGNOSIS — M19019 Primary osteoarthritis, unspecified shoulder: Secondary | ICD-10-CM | POA: Diagnosis not present

## 2011-06-07 DIAGNOSIS — J309 Allergic rhinitis, unspecified: Secondary | ICD-10-CM

## 2011-06-07 DIAGNOSIS — Z79899 Other long term (current) drug therapy: Secondary | ICD-10-CM

## 2011-06-07 DIAGNOSIS — M129 Arthropathy, unspecified: Secondary | ICD-10-CM | POA: Diagnosis present

## 2011-06-07 DIAGNOSIS — I6340 Cerebral infarction due to embolism of unspecified cerebral artery: Secondary | ICD-10-CM | POA: Diagnosis not present

## 2011-06-07 HISTORY — DX: Cerebral infarction, unspecified: I63.9

## 2011-06-07 LAB — CBC
HCT: 42.5 % (ref 36.0–46.0)
MCHC: 33.9 g/dL (ref 30.0–36.0)
RDW: 12.9 % (ref 11.5–15.5)

## 2011-06-07 LAB — URINE MICROSCOPIC-ADD ON

## 2011-06-07 LAB — DIFFERENTIAL
Basophils Relative: 1 % (ref 0–1)
Lymphocytes Relative: 12 % (ref 12–46)
Monocytes Absolute: 0.4 10*3/uL (ref 0.1–1.0)
Monocytes Relative: 4 % (ref 3–12)
Neutro Abs: 8 10*3/uL — ABNORMAL HIGH (ref 1.7–7.7)

## 2011-06-07 LAB — COMPREHENSIVE METABOLIC PANEL
Albumin: 4.4 g/dL (ref 3.5–5.2)
Alkaline Phosphatase: 50 U/L (ref 39–117)
BUN: 19 mg/dL (ref 6–23)
Potassium: 3.6 mEq/L (ref 3.5–5.1)
Sodium: 136 mEq/L (ref 135–145)
Total Protein: 7.2 g/dL (ref 6.0–8.3)

## 2011-06-07 LAB — MAGNESIUM: Magnesium: 2 mg/dL (ref 1.5–2.5)

## 2011-06-07 LAB — GLUCOSE, CAPILLARY: Glucose-Capillary: 136 mg/dL — ABNORMAL HIGH (ref 70–99)

## 2011-06-07 LAB — POCT I-STAT, CHEM 8
Creatinine, Ser: 0.8 mg/dL (ref 0.50–1.10)
Glucose, Bld: 142 mg/dL — ABNORMAL HIGH (ref 70–99)
Hemoglobin: 15 g/dL (ref 12.0–15.0)
Potassium: 3.6 mEq/L (ref 3.5–5.1)
TCO2: 24 mmol/L (ref 0–100)

## 2011-06-07 LAB — URINALYSIS, ROUTINE W REFLEX MICROSCOPIC
Glucose, UA: NEGATIVE mg/dL
Hgb urine dipstick: NEGATIVE
Specific Gravity, Urine: 1.02 (ref 1.005–1.030)

## 2011-06-07 LAB — PROTIME-INR: Prothrombin Time: 12.8 seconds (ref 11.6–15.2)

## 2011-06-07 LAB — PHOSPHORUS: Phosphorus: 3.4 mg/dL (ref 2.3–4.6)

## 2011-06-07 LAB — CK TOTAL AND CKMB (NOT AT ARMC)
CK, MB: 2.4 ng/mL (ref 0.3–4.0)
Total CK: 70 U/L (ref 7–177)

## 2011-06-07 MED ORDER — SODIUM CHLORIDE 0.9 % IV SOLN
INTRAVENOUS | Status: DC
Start: 2011-06-07 — End: 2011-06-09
  Administered 2011-06-07: 1000 mL via INTRAVENOUS

## 2011-06-07 MED ORDER — ASPIRIN 325 MG PO TABS
325.0000 mg | ORAL_TABLET | Freq: Every day | ORAL | Status: DC
  Administered 2011-06-08 – 2011-06-09 (×2): 325 mg via ORAL
  Filled 2011-06-07 (×2): qty 1

## 2011-06-07 MED ORDER — SENNOSIDES-DOCUSATE SODIUM 8.6-50 MG PO TABS: 1.0000 | ORAL_TABLET | Freq: Every evening | ORAL | Status: DC | PRN

## 2011-06-07 MED ORDER — ONDANSETRON HCL 4 MG/2ML IJ SOLN: 4.0000 mg | Freq: Four times a day (QID) | INTRAMUSCULAR | Status: DC | PRN

## 2011-06-07 MED ORDER — MECLIZINE HCL 25 MG PO TABS
25.0000 mg | ORAL_TABLET | Freq: Three times a day (TID) | ORAL | Status: DC | PRN
  Filled 2011-06-07: qty 1

## 2011-06-07 MED ORDER — MECLIZINE HCL 25 MG PO TABS
25.0000 mg | ORAL_TABLET | Freq: Once | ORAL | Status: AC
  Administered 2011-06-07: 25 mg via ORAL
  Filled 2011-06-07: qty 1

## 2011-06-07 MED ORDER — ENOXAPARIN SODIUM 40 MG/0.4ML ~~LOC~~ SOLN: 40.0000 mg | SUBCUTANEOUS | Status: DC

## 2011-06-07 MED ORDER — ONDANSETRON HCL 4 MG PO TABS: 4.0000 mg | ORAL_TABLET | Freq: Four times a day (QID) | ORAL | Status: DC | PRN

## 2011-06-07 MED ORDER — ONDANSETRON HCL 4 MG/2ML IJ SOLN
4.0000 mg | Freq: Once | INTRAMUSCULAR | Status: AC
  Administered 2011-06-07: 4 mg via INTRAVENOUS
  Filled 2011-06-07: qty 2

## 2011-06-07 MED ORDER — PROMETHAZINE HCL 25 MG/ML IJ SOLN
12.5000 mg | Freq: Four times a day (QID) | INTRAMUSCULAR | Status: DC | PRN
  Administered 2011-06-07: 12.5 mg via INTRAVENOUS
  Filled 2011-06-07: qty 1

## 2011-06-07 MED ORDER — ASPIRIN 325 MG PO TABS: 650.0000 mg | ORAL_TABLET | Freq: Once | ORAL | Status: DC

## 2011-06-07 MED ORDER — CALCIUM CARBONATE 600 MG PO TABS
600.0000 mg | ORAL_TABLET | Freq: Every day | ORAL | Status: DC
  Filled 2011-06-07 (×2): qty 1

## 2011-06-07 MED ORDER — ENOXAPARIN SODIUM 40 MG/0.4ML ~~LOC~~ SOLN
40.0000 mg | SUBCUTANEOUS | Status: DC
  Administered 2011-06-07 – 2011-06-08 (×2): 40 mg via SUBCUTANEOUS
  Filled 2011-06-07 (×4): qty 0.4

## 2011-06-07 MED ORDER — ASPIRIN 300 MG RE SUPP
300.0000 mg | Freq: Every day | RECTAL | Status: DC
  Filled 2011-06-07 (×2): qty 1

## 2011-06-07 MED ORDER — ZOLPIDEM TARTRATE 10 MG PO TABS: 10.0000 mg | ORAL_TABLET | Freq: Every day | ORAL | Status: DC

## 2011-06-07 MED ORDER — SODIUM CHLORIDE 0.9 % IJ SOLN
3.0000 mL | Freq: Two times a day (BID) | INTRAMUSCULAR | Status: DC
  Administered 2011-06-08 – 2011-06-09 (×4): 3 mL via INTRAVENOUS

## 2011-06-07 MED ORDER — HYDROCODONE-ACETAMINOPHEN 5-325 MG PO TABS: 1.0000 | ORAL_TABLET | ORAL | Status: DC | PRN

## 2011-06-07 NOTE — ED Notes (Signed)
Patient transported to MRI 

## 2011-06-07 NOTE — ED Notes (Signed)
Pt back from MRI , admitting at Bedside

## 2011-06-07 NOTE — Progress Notes (Addendum)
Patient ID: Felicia Hunter, female   DOB: 1940/05/28, 71 y.o.   MRN: [id number] Results of MRI consistant with cerebellar infarct - will call the admitting to change the bed assignment to stroke unit. Stroke team called and recommendation was to transfer to [facility]. Notified floor manager that the patient will be transferred to [place].

## 2011-06-07 NOTE — Progress Notes (Signed)
Report given to [place], RN 4 west.

## 2011-06-07 NOTE — ED Notes (Signed)
[facility] called  

## 2011-06-07 NOTE — H&P (Addendum)
PCP:  Londell Moh, MD, MD   DOA:  06/07/2011  4:52 PM  Chief Complaint:  Vertigo, dizziness  HPI: Pt is 71 yo female who presents to [facility] ED with main concern of sudden onset dizziness, vertigo, unsteady gait that occurred today the day of admission. She denies similar episodes in the past and reports today she felt as was going to pass out. Her symptoms were associated with nausea but no vomiting. She denies recent sicknesses or hospitalizations, no fevers, chills, no specific abdominal or urinary concerns, no focal neurologic deficits, no headaches and no other visual changes. She also reports being in her usual state of health prior to the event. She denies chest pain or shortness of breath, no cough and no other systemic symptoms.  Assessment/Plan:  Principal Problem:   *Near syncope - unclear what is the etiology of the event and ? CVA is certainly in differential, vs benign vertigo vs cardiogenic event - will admit the patient to telemtry floor and monitor vitals per floor protocol - will obtain following tests: 12 lead EKG, CE's x 3, TSH, orthostatic vitals, Vit B12 - current blood work including BMP and CBC is not indicative of any etiology at this time - will provide supportive care with IVF, antiemetics, meclizine PRN - please note that CT head is negative for acute intracranial findings and MRI physician ordered MRI - PT evaluation obtained  Active Problems:  Hyperglycemia - mild - will check A1C - will start SSI only if indicated  Hypercalcemia - mild and likely secondary to dehydration - will provide IVF  DVT Prophylaxis - Lovenox  Code Status - Full  Education  - test results and diagnostic studies were discussed with patient and patient family - patient verbalized the understanding - questions were answered at the bedside and contact information was provided for additional questions or concerns   Allergies: Allergies  Allergen Reactions  .  Sulfonamide Derivatives Rash    Prior to Admission medications   Medication Sig Start Date End Date Taking? Authorizing Provider  aspirin 325 MG tablet Take 650 mg by mouth once.   Yes Historical Provider, MD  BIOTIN PO Take 1 tablet by mouth daily.   Yes Historical Provider, MD  calcium carbonate (CALCIUM 600) 600 MG TABS Take 600 mg by mouth daily.   Yes Historical Provider, MD  cholecalciferol (VITAMIN D) 400 UNITS TABS Take 1,000 Units by mouth daily.   Yes Historical Provider, MD  glucosamine-chondroitin 500-400 MG tablet Take 1 tablet by mouth 2 (two) times daily.   Yes Historical Provider, MD  Multiple Vitamin (MULITIVITAMIN WITH MINERALS) TABS Take 1 tablet by mouth daily.   Yes Historical Provider, MD  naproxen sodium (ALEVE) 220 MG tablet Take 220 mg by mouth 2 (two) times daily as needed. For pain   Yes Historical Provider, MD  omega-3 acid ethyl esters (LOVAZA) 1 G capsule Take 2 g by mouth 2 (two) times daily.   Yes Historical Provider, MD  Zolpidem Tartrate (AMBIEN PO) Take 10 mg by mouth daily as needed. For sleep   Yes Historical Provider, MD    Past Medical History  Diagnosis Date  . GERD (gastroesophageal reflux disease)   . Cancer     breast  . Arthritis   . Cough     Past Surgical History  Procedure Date  . Joint replacement july 9th, 2012    knee replacement   . Collarbone september 2011    pinned and plated  . Breast surgery  june 2011    double mastectomy   . Abdominal hysterectomy 1993    Social History:  reports that she has never smoked. She has never used smokeless tobacco. She reports that she drinks about .6 ounces of alcohol per week. She reports that she does not use illicit drugs.  History reviewed. No pertinent family history.  Review of Systems:  Constitutional: Denies fever, chills, diaphoresis, appetite change and fatigue.  HEENT: Denies photophobia, eye pain, redness, hearing loss, ear pain, congestion, sore throat, rhinorrhea, sneezing,  mouth sores, trouble swallowing, neck pain, neck stiffness and tinnitus.   Respiratory: Denies SOB, DOE, cough, chest tightness,  and wheezing.   Cardiovascular: Denies chest pain, palpitations and leg swelling.  Gastrointestinal: Denies vomiting, abdominal pain, diarrhea, constipation, blood in stool and abdominal distention.  Genitourinary: Denies dysuria, urgency, frequency, hematuria, flank pain and difficulty urinating.  Musculoskeletal: Denies myalgias, back pain, joint swelling, arthralgias and gait problem.  Skin: Denies pallor, rash and wound.  Neurological: Denies seizures, syncope, weakness, light-headedness, numbness and headaches.  Hematological: Denies adenopathy. Easy bruising, personal or family bleeding history  Psychiatric/Behavioral: Denies suicidal ideation, mood changes, confusion, nervousness, sleep disturbance and agitation  Physical Exam:  Filed Vitals:   06/07/11 1653 06/07/11 1904 06/07/11 1944  BP: 160/89  158/81  Pulse: 61  54  Temp: 97.4 F (36.3 C) 97.8 F (36.6 C) 97.3 F (36.3 C)  TempSrc: Oral  Oral  Resp: 17  20  SpO2: 94%  97%    Constitutional: Vital signs reviewed.  Patient is in no acute distress and cooperative with exam. Alert and oriented x3.  Head: Normocephalic and atraumatic Ear: TM normal bilaterally Mouth: no erythema or exudates, MMM Eyes: PERRL, EOMI, conjunctivae normal, No scleral icterus.  Neck: Supple, Trachea midline normal ROM, No JVD, mass, thyromegaly, or carotid bruit present.  Cardiovascular: RRR, S1 normal, S2 normal, no MRG, pulses symmetric and intact bilaterally Pulmonary/Chest: CTAB, no wheezes, rales, or rhonchi Abdominal: Soft. Non-tender, non-distended, bowel sounds are normal, no masses, organomegaly, or guarding present.  GU: no CVA tenderness Musculoskeletal: No joint deformities, erythema, or stiffness, ROM full and no nontender Ext: no edema and no cyanosis, pulses palpable bilaterally (DP and PT) Hematology:  no cervical, inginal, or axillary adenopathy.  Neurological: A&O x3, Strenght is normal and symmetric bilaterally, cranial nerve II-XII are grossly intact, no focal motor deficit, sensory intact to light touch bilaterally.  Skin: Warm, dry and intact. No rash, cyanosis, or clubbing.  Psychiatric: Normal mood and affect. speech and behavior is normal. Judgment and thought content normal. Cognition and memory are normal.   Labs on Admission:  Results for orders placed during the hospital encounter of 06/07/11 (from the past 48 hour(s))  CBC     Status: Normal   Collection Time   06/07/11  5:16 PM      Component Value Range Comment   WBC 9.7  4.0 - 10.5 (K/uL)    RBC 4.47  3.87 - 5.11 (MIL/uL)    Hemoglobin 14.4  12.0 - 15.0 (g/dL)    HCT 16.1  09.6 - 04.5 (%)    MCV 95.1  78.0 - 100.0 (fL)    MCH 32.2  26.0 - 34.0 (pg)    MCHC 33.9  30.0 - 36.0 (g/dL)    RDW 40.9  81.1 - 91.4 (%)    Platelets 258  150 - 400 (K/uL)   COMPREHENSIVE METABOLIC PANEL     Status: Abnormal   Collection Time   06/07/11  5:16 PM      Component Value Range Comment   Sodium 136  135 - 145 (mEq/L)    Potassium 3.6  3.5 - 5.1 (mEq/L)    Chloride 100  96 - 112 (mEq/L)    CO2 24  19 - 32 (mEq/L)    Glucose, Bld 140 (*) 70 - 99 (mg/dL)    BUN 19  6 - 23 (mg/dL)    Creatinine, Ser 1.61  0.50 - 1.10 (mg/dL)    Calcium 09.6 (*) 8.4 - 10.5 (mg/dL)    Total Protein 7.2  6.0 - 8.3 (g/dL)    Albumin 4.4  3.5 - 5.2 (g/dL)    AST 21  0 - 37 (U/L)    ALT 15  0 - 35 (U/L)    Alkaline Phosphatase 50  39 - 117 (U/L)    Total Bilirubin 0.8  0.3 - 1.2 (mg/dL)    GFR calc non Af Amer 86 (*) >90 (mL/min)    GFR calc Af Amer >90  >90 (mL/min)   DIFFERENTIAL     Status: Abnormal   Collection Time   06/07/11  5:16 PM      Component Value Range Comment   Neutrophils Relative 83 (*) 43 - 77 (%)    Neutro Abs 8.0 (*) 1.7 - 7.7 (K/uL)    Lymphocytes Relative 12  12 - 46 (%)    Lymphs Abs 1.1  0.7 - 4.0 (K/uL)    Monocytes  Relative 4  3 - 12 (%)    Monocytes Absolute 0.4  0.1 - 1.0 (K/uL)    Eosinophils Relative 1  0 - 5 (%)    Eosinophils Absolute 0.1  0.0 - 0.7 (K/uL)    Basophils Relative 1  0 - 1 (%)    Basophils Absolute 0.1  0.0 - 0.1 (K/uL)   URINALYSIS, ROUTINE W REFLEX MICROSCOPIC     Status: Abnormal   Collection Time   06/07/11  5:22 PM      Component Value Range Comment   Color, Urine YELLOW  YELLOW     APPearance CLEAR  CLEAR     Specific Gravity, Urine 1.020  1.005 - 1.030     pH 7.0  5.0 - 8.0     Glucose, UA NEGATIVE  NEGATIVE (mg/dL)    Hgb urine dipstick NEGATIVE  NEGATIVE     Bilirubin Urine NEGATIVE  NEGATIVE     Ketones, ur NEGATIVE  NEGATIVE (mg/dL)    Protein, ur NEGATIVE  NEGATIVE (mg/dL)    Urobilinogen, UA 0.2  0.0 - 1.0 (mg/dL)    Nitrite NEGATIVE  NEGATIVE     Leukocytes, UA TRACE (*) NEGATIVE    URINE MICROSCOPIC-ADD ON     Status: Normal   Collection Time   06/07/11  5:22 PM      Component Value Range Comment   WBC, UA 0-2  <3 (WBC/hpf)    Bacteria, UA RARE  RARE     Urine-Other AMORPHOUS URATES/PHOSPHATES   MUCOUS PRESENT  PROTIME-INR     Status: Normal   Collection Time   06/07/11  6:46 PM      Component Value Range Comment   Prothrombin Time 12.8  11.6 - 15.2 (seconds)    INR 0.94  0.00 - 1.49    APTT     Status: Normal   Collection Time   06/07/11  6:46 PM      Component Value Range Comment   aPTT 27  24 - 37 (  seconds)   GLUCOSE, CAPILLARY     Status: Abnormal   Collection Time   06/07/11  7:00 PM      Component Value Range Comment   Glucose-Capillary 136 (*) 70 - 99 (mg/dL)    Comment 1 Documented in Chart      Comment 2 Notify RN     CK TOTAL AND CKMB     Status: Normal   Collection Time   06/07/11  7:15 PM      Component Value Range Comment   Total CK 70  7 - 177 (U/L)    CK, MB 2.4  0.3 - 4.0 (ng/mL)    Relative Index RELATIVE INDEX IS INVALID  0.0 - 2.5    TROPONIN I     Status: Normal   Collection Time   06/07/11  7:15 PM      Component Value  Range Comment   Troponin I <0.30  <0.30 (ng/mL)   POCT I-STAT, CHEM 8     Status: Abnormal   Collection Time   06/07/11  7:26 PM      Component Value Range Comment   Sodium 138  135 - 145 (mEq/L)    Potassium 3.6  3.5 - 5.1 (mEq/L)    Chloride 108  96 - 112 (mEq/L)    BUN 21  6 - 23 (mg/dL)    Creatinine, Ser 9.60  0.50 - 1.10 (mg/dL)    Glucose, Bld 454 (*) 70 - 99 (mg/dL)    Calcium, Ion 0.98  1.12 - 1.32 (mmol/L)    TCO2 24  0 - 100 (mmol/L)    Hemoglobin 15.0  12.0 - 15.0 (g/dL)    HCT 11.9  14.7 - 82.9 (%)     Radiological Exams on Admission: CT head: 06/07/2011 No acute intracranial findings  Time Spent on Admission: Over 30 minutes  Juan Olthoff 06/07/2011, 9:02 PM  [facility] Pager # [phone] Main Office # [phone]

## 2011-06-07 NOTE — ED Notes (Signed)
Pt reports feeling dizzy and nauseous at 1500 this pm.  Reports weakness and vomiting x 5.  Denies any diarrhea at this time.  Reports slight pain in her stomach.

## 2011-06-07 NOTE — ED Notes (Signed)
Pt did well with swallow screen. No issues. Pt able to control secretions, move tongue in all directions. Able to drink without difficulty and eat without and gaging or aspiration.

## 2011-06-07 NOTE — ED Notes (Signed)
[id number]<NG> Expected date:<BR> Expected time: 4:37 PM<BR> Means of arrival:Ambulance<BR> Comments:<BR> M15 -- Near Syncope

## 2011-06-07 NOTE — ED Provider Notes (Signed)
History     CSN: [id number]  Arrival date & time 06/07/11  1643   First MD Initiated Contact with Patient 06/07/11 1817      Chief Complaint  Patient presents with  . Near Syncope  . Nausea  . Emesis  . Dizziness    (Consider location/radiation/quality/duration/timing/severity/associated sxs/prior treatment) HPI Pt started feeling nausea and lightheadedness this afternoon.  Pt states she has had vomiting and dizziness at the same time.  Pt states the dizziness is better now but she still does not feel right.  No headache.  No abdominal pain or chest pain. Pt has not tried to get up so she is not sure about her balance.  When she sat up though she felt like she would fall so she called 911. Past Medical History  Diagnosis Date  . GERD (gastroesophageal reflux disease)   . Cancer     breast  . Arthritis   . Cough     Past Surgical History  Procedure Date  . Joint replacement july 9th, 2012    knee replacement   . Collarbone september 2011    pinned and plated  . Breast surgery june 2011    double mastectomy   . Abdominal hysterectomy 1993    No family history on file.  History  Substance Use Topics  . Smoking status: Never Smoker   . Smokeless tobacco: Never Used  . Alcohol Use: 0.6 oz/week    1 Glasses of wine per week    OB History    Grav Para Term Preterm Abortions TAB SAB Ect Mult Living                  Review of Systems  HENT: Negative for hearing loss and tinnitus.   All other systems reviewed and are negative.    Allergies  Sulfonamide derivatives  Home Medications   Current Outpatient Rx  Name Route Sig Dispense Refill  . ASPIRIN 325 MG PO TABS Oral Take 650 mg by mouth once.    Marland Kitchen BIOTIN PO Oral Take 1 tablet by mouth daily.    Marland Kitchen CALCIUM CARBONATE 600 MG PO TABS Oral Take 600 mg by mouth daily.    . CHOLECALCIFEROL 400 UNITS PO TABS Oral Take 1,000 Units by mouth daily.    Marland Kitchen GLUCOSAMINE-CHONDROITIN 500-400 MG PO TABS Oral Take 1 tablet  by mouth 2 (two) times daily.    . ADULT MULTIVITAMIN W/MINERALS CH Oral Take 1 tablet by mouth daily.    Marland Kitchen NAPROXEN SODIUM 220 MG PO TABS Oral Take 220 mg by mouth 2 (two) times daily as needed. For pain    . OMEGA-3-ACID ETHYL ESTERS 1 G PO CAPS Oral Take 2 g by mouth 2 (two) times daily.    . AMBIEN PO Oral Take 10 mg by mouth daily as needed. For sleep      BP 160/89  Pulse 61  Temp(Src) 97.4 F (36.3 C) (Oral)  Resp 17  SpO2 94%  Physical Exam  Nursing note and vitals reviewed. Constitutional: She is oriented to person, place, and time. She appears well-developed and well-nourished. No distress.  HENT:  Head: Normocephalic and atraumatic.  Right Ear: External ear normal.  Left Ear: External ear normal.  Mouth/Throat: Oropharynx is clear and moist.  Eyes: Conjunctivae are normal. Right eye exhibits no discharge. Left eye exhibits no discharge. No scleral icterus.  Neck: Neck supple. No tracheal deviation present.  Cardiovascular: Normal rate, regular rhythm and intact distal pulses.  Pulmonary/Chest: Effort normal and breath sounds normal. No stridor. No respiratory distress. She has no wheezes. She has no rales.  Abdominal: Soft. Bowel sounds are normal. She exhibits no distension. There is no tenderness. There is no rebound and no guarding.  Musculoskeletal: She exhibits no edema and no tenderness.  Neurological: She is alert and oriented to person, place, and time. She has normal strength. No cranial nerve deficit or sensory deficit. She exhibits normal muscle tone. She displays no seizure activity. Coordination normal.       No pronator drift bilateral upper extrem, able to hold both legs off bed for 5 seconds, sensation intact in all extremities, no visual field cuts, no left or right sided neglect  Skin: Skin is warm and dry. Rash: vitiligo.  Psychiatric: She has a normal mood and affect.    ED Course  Procedures (including critical care time)  Labs Reviewed    URINALYSIS, ROUTINE W REFLEX MICROSCOPIC - Abnormal; Notable for the following:    Leukocytes, UA TRACE (*)    All other components within normal limits  COMPREHENSIVE METABOLIC PANEL - Abnormal; Notable for the following:    Glucose, Bld 140 (*)    Calcium 10.7 (*)    GFR calc non Af Amer 86 (*)    All other components within normal limits  GLUCOSE, CAPILLARY - Abnormal; Notable for the following:    Glucose-Capillary 136 (*)    All other components within normal limits  DIFFERENTIAL - Abnormal; Notable for the following:    Neutrophils Relative 83 (*)    Neutro Abs 8.0 (*)    All other components within normal limits  POCT I-STAT, CHEM 8 - Abnormal; Notable for the following:    Glucose, Bld 142 (*)    All other components within normal limits  URINE MICROSCOPIC-ADD ON  CBC  PROTIME-INR  APTT  CK TOTAL AND CKMB  TROPONIN I  DIFFERENTIAL   Ct Head Wo Contrast  06/07/2011  *RADIOLOGY REPORT*  Clinical Data: Nausea and emesis.  Dizziness  CT HEAD WITHOUT CONTRAST  Technique:  Contiguous axial images were obtained from the base of the skull through the vertex without contrast.  Comparison: None  Findings: The brain has a normal appearance without evidence for hemorrhage, infarction, hydrocephalus, or mass lesion.  There is no extra axial fluid collection.  The skull and paranasal sinuses are normal.  IMPRESSION: Negative exam.  Original Report Authenticated By: Rosealee Albee, M.D.     No diagnosis found.    MDM  Patient's pulmonary workup is negative. She has symptoms that are concerning for central vertigo however. There is not a discrete positional component and she continues to have dizziness and nausea. We attempted to ambulate the patient in the emergency department but she was unable to do so. I will admit the patient to the hospital and ordered an MRI for further evaluation        Celene Kras, MD 06/07/11 2047

## 2011-06-07 NOTE — ED Notes (Signed)
Patient transported to CT 

## 2011-06-07 NOTE — Progress Notes (Signed)
Report called to [facility] to 3000. RN , Michele Mcalpine took report. Questions answered. Labs reviewed. MRI results reviewed. Will call [facility] to transport

## 2011-06-07 NOTE — ED Notes (Signed)
Pt was dizzy, n/v while sitting down felt syncope and weak while at rest approx 1 hr ago, no chest pain no abd pain. Alert x4,

## 2011-06-08 ENCOUNTER — Inpatient Hospital Stay (HOSPITAL_COMMUNITY): Payer: Medicare Other

## 2011-06-08 ENCOUNTER — Encounter (HOSPITAL_COMMUNITY): Payer: Self-pay | Admitting: *Deleted

## 2011-06-08 DIAGNOSIS — R55 Syncope and collapse: Secondary | ICD-10-CM | POA: Diagnosis not present

## 2011-06-08 DIAGNOSIS — IMO0002 Reserved for concepts with insufficient information to code with codable children: Secondary | ICD-10-CM | POA: Diagnosis not present

## 2011-06-08 DIAGNOSIS — Z96659 Presence of unspecified artificial knee joint: Secondary | ICD-10-CM | POA: Diagnosis not present

## 2011-06-08 DIAGNOSIS — I6509 Occlusion and stenosis of unspecified vertebral artery: Secondary | ICD-10-CM | POA: Diagnosis not present

## 2011-06-08 DIAGNOSIS — R42 Dizziness and giddiness: Secondary | ICD-10-CM | POA: Diagnosis not present

## 2011-06-08 DIAGNOSIS — Z7982 Long term (current) use of aspirin: Secondary | ICD-10-CM | POA: Diagnosis not present

## 2011-06-08 DIAGNOSIS — R269 Unspecified abnormalities of gait and mobility: Secondary | ICD-10-CM | POA: Diagnosis not present

## 2011-06-08 DIAGNOSIS — R7309 Other abnormal glucose: Secondary | ICD-10-CM | POA: Diagnosis present

## 2011-06-08 DIAGNOSIS — I6350 Cerebral infarction due to unspecified occlusion or stenosis of unspecified cerebral artery: Secondary | ICD-10-CM | POA: Diagnosis present

## 2011-06-08 DIAGNOSIS — I6330 Cerebral infarction due to thrombosis of unspecified cerebral artery: Secondary | ICD-10-CM | POA: Diagnosis not present

## 2011-06-08 DIAGNOSIS — Z79899 Other long term (current) drug therapy: Secondary | ICD-10-CM | POA: Diagnosis not present

## 2011-06-08 DIAGNOSIS — Z888 Allergy status to other drugs, medicaments and biological substances status: Secondary | ICD-10-CM | POA: Diagnosis not present

## 2011-06-08 DIAGNOSIS — I6789 Other cerebrovascular disease: Secondary | ICD-10-CM | POA: Diagnosis not present

## 2011-06-08 DIAGNOSIS — R7989 Other specified abnormal findings of blood chemistry: Secondary | ICD-10-CM | POA: Diagnosis not present

## 2011-06-08 DIAGNOSIS — F329 Major depressive disorder, single episode, unspecified: Secondary | ICD-10-CM | POA: Diagnosis present

## 2011-06-08 DIAGNOSIS — I6340 Cerebral infarction due to embolism of unspecified cerebral artery: Secondary | ICD-10-CM | POA: Diagnosis not present

## 2011-06-08 DIAGNOSIS — E785 Hyperlipidemia, unspecified: Secondary | ICD-10-CM | POA: Diagnosis present

## 2011-06-08 DIAGNOSIS — I639 Cerebral infarction, unspecified: Secondary | ICD-10-CM

## 2011-06-08 DIAGNOSIS — Z853 Personal history of malignant neoplasm of breast: Secondary | ICD-10-CM | POA: Diagnosis not present

## 2011-06-08 DIAGNOSIS — N19 Unspecified kidney failure: Secondary | ICD-10-CM | POA: Diagnosis not present

## 2011-06-08 DIAGNOSIS — Z5189 Encounter for other specified aftercare: Secondary | ICD-10-CM | POA: Diagnosis not present

## 2011-06-08 DIAGNOSIS — M129 Arthropathy, unspecified: Secondary | ICD-10-CM | POA: Diagnosis present

## 2011-06-08 DIAGNOSIS — K219 Gastro-esophageal reflux disease without esophagitis: Secondary | ICD-10-CM | POA: Diagnosis present

## 2011-06-08 DIAGNOSIS — J45909 Unspecified asthma, uncomplicated: Secondary | ICD-10-CM | POA: Diagnosis present

## 2011-06-08 DIAGNOSIS — Z882 Allergy status to sulfonamides status: Secondary | ICD-10-CM | POA: Diagnosis not present

## 2011-06-08 LAB — MRSA PCR SCREENING: MRSA by PCR: NEGATIVE

## 2011-06-08 MED ORDER — ZOLPIDEM TARTRATE 5 MG PO TABS
5.0000 mg | ORAL_TABLET | Freq: Every day | ORAL | Status: DC
  Administered 2011-06-08: 5 mg via ORAL
  Filled 2011-06-08: qty 1

## 2011-06-08 MED ORDER — ACETAMINOPHEN 325 MG PO TABS
650.0000 mg | ORAL_TABLET | Freq: Four times a day (QID) | ORAL | Status: DC | PRN
  Administered 2011-06-08 – 2011-06-09 (×2): 650 mg via ORAL
  Filled 2011-06-08 (×2): qty 2

## 2011-06-08 MED ORDER — CALCIUM CARBONATE 1250 (500 CA) MG PO TABS
1.0000 | ORAL_TABLET | Freq: Every day | ORAL | Status: DC
  Administered 2011-06-08 – 2011-06-09 (×2): 500 mg via ORAL
  Filled 2011-06-08 (×2): qty 1

## 2011-06-08 NOTE — Progress Notes (Signed)
   CARE MANAGEMENT NOTE 06/08/2011  Patient:  Felicia Hunter, Felicia Hunter   Account Number:  [id number]  Date Initiated:  06/08/2011  Documentation initiated by:  Onnie Boer  Subjective/Objective Assessment:   PT WAS ADMITTED WITH BLURRED VISION AND DIZZINESS     Action/Plan:   PROGRESSION OF CARE AND DISCHARGE PLANNING   Anticipated DC Date:  06/11/2011   Anticipated DC Plan:  HOME W HOME HEALTH SERVICES      DC Planning Services  CM consult      Choice offered to / List presented to:             Status of service:  In process, will continue to follow Medicare Important Message given?   (If response is "NO", the following Medicare IM given date fields will be blank) Date Medicare IM given:   Date Additional Medicare IM given:    Discharge Disposition:    Per UR Regulation:  Reviewed for med. necessity/level of care/duration of stay  If discussed at Long Length of Stay Meetings, dates discussed:    Comments:  06/08/11 Onnie Boer, RN, BSN 1109 UR COMPLETED

## 2011-06-08 NOTE — Progress Notes (Signed)
PROGRESS NOTE  Felicia Hunter ZOX:[id number] DOB: [birth year] DOA: 06/07/2011 PCP: Londell Moh, MD, MD  Brief narrative: Admitted with dizziness/nausea/found to have multiple areas of ischemic infarction on MRI  Past medical history: Breast cancer, right DCIS s/p R mastectomy 08/20/09+followed by bilateral Mastectomy-07/24/2010->followed by Dr. Sharalyn Ink, history of asthma, history of depression, history of near syncopal  Consultants:  Neurology  Procedures:  CT head 3/25 = negative exam  MR brain without cm= Patchy areas of acute infarct in the left cerebellar  And on a head 3/26 = mid to inferior left cerebellar infarct, abnormal parents left vertebral arteries marked focal stenosis at the expected takeoff of the left PICA  Chest x-ray 3/25 = minimal left basilar airspace opacity reflect atelectasis. Degenerative changes of right glenohumeral joint  Echo = EF 55-60% + grade 1 diastolic dysfunction, PA peak 34 mmHg, and no source of embolic phenomena noted ? Mild pulmonary hypertension  Antibiotics:  None   Subjective  Doing well, but very vertiginous. States that when he gets up feels like she is falling to her left.  Mildly nauseous still. Unsure if she can eat breakfast. Views that she had a long journey to [place], 8 hours driving in the pastone week   Objective    Objective: Filed Vitals:   06/08/11 0235 06/08/11 0435 06/08/11 0635 06/08/11 0830  BP: 119/57 106/65 107/53 112/63  Pulse: 64 64 63 62  Temp: 98.2 F (36.8 C) 98.3 F (36.8 C) 98.2 F (36.8 C) 99.3 F (37.4 C)  TempSrc:    Oral  Resp: 16 16 16 17   Height:      Weight:      SpO2: 94% 95% 93% 96%    Intake/Output Summary (Last 24 hours) at 06/08/11 1150 Last data filed at 06/08/11 0700  Gross per 24 hour  Intake 561.25 ml  Output   1200 ml  Net -638.75 ml    Exam:  General: alert pleasant CF, NAD.  Seems anxious Cardiovascular: s1 s2 no m/r/g.  NO added sound Respiratory:  cta b, no added sound Abdomen: soft, nt,nd Skin no lower ext edema Neuro finger-nose-finger normal Power 5/5 in all flexors/extensors grossly EOMI Gait N/a 2/2 to imbalance issues reflexes 3/3 in dtr Sensation seems grossly intact  Data Reviewed: Basic Metabolic Panel:  Lab 06/07/11 8119 06/07/11 1716  NA 138 136  K 3.6 3.6  CL 108 100  CO2 -- 24  GLUCOSE 142* 140*  BUN 21 19  CREATININE 0.80 0.69  CALCIUM -- 10.7*  MG -- 2.0  PHOS -- 3.4   Liver Function Tests:  Lab 06/07/11 1716  AST 21  ALT 15  ALKPHOS 50  BILITOT 0.8  PROT 7.2  ALBUMIN 4.4   No results found for this basename: LIPASE:5,AMYLASE:5 in the last 168 hours No results found for this basename: AMMONIA:5 in the last 168 hours CBC:  Lab 06/07/11 1926 06/07/11 1716  WBC -- 9.7  NEUTROABS -- 8.0*  HGB 15.0 14.4  HCT 44.0 42.5  MCV -- 95.1  PLT -- 258   Cardiac Enzymes:  Lab 06/07/11 1915  CKTOTAL 70  CKMB 2.4  CKMBINDEX --  TROPONINI <0.30   BNP: No components found with this basename: POCBNP:5 CBG:  Lab 06/08/11 0640 06/07/11 1900  GLUCAP 98 136*    No results found for this or any previous visit (from the past 240 hour(s)).   Studies:              All Imaging reviewed  and is as per above notation   Scheduled Meds:   . aspirin  300 mg Rectal Daily   Or  . aspirin  325 mg Oral Daily  . calcium carbonate  1 tablet Oral Daily  . enoxaparin  40 mg Subcutaneous Q24H  . meclizine  25 mg Oral Once  . ondansetron (ZOFRAN) IV  4 mg Intravenous Once  . ondansetron  4 mg Intravenous Once  . sodium chloride  3 mL Intravenous Q12H  . zolpidem  5 mg Oral QHS  . DISCONTD: aspirin  650 mg Oral Once  . DISCONTD: calcium carbonate  600 mg Oral Daily  . DISCONTD: enoxaparin  40 mg Subcutaneous Q24H  . DISCONTD: zolpidem  10 mg Oral QHS   Continuous Infusions:   . sodium chloride 75 mL/hr at 06/08/11 0700     Assessment/Plan: 1. Cerebellar CVAs-will rule out lower extremity having DVT,  given her recent travel history. She should continue on aspirin 325 mg. Although she has no functional gross neurological weakness, her balance issues preclude her from resuming normal function home and I've asked therapy to specifically assess her for appropriateness to inpatient rehabilitation-appreciate neurology input, would continue aspirin 325 mg daily-await carotid US.  Lipid panel that was ordered last p.m. appears to be in consult (likely because patient was transferred from [place]) I'll reorder for a 2. History of bilateral mastectomy in the setting of breast cancer-this could be a contributor in addition to relative immobility to her CVA. She follows with Dr. Myna Hidalgo and will need to see him for interval followup/mammogram. 3. Hyperglycemia-HbA1c 5.8, no indication at this time for insulin. Outpatient followup. 4. Depression-lost her husband last year. Outpatient reevaluation and followup  Code Status: Full Family Communication: None at bedside Disposition Plan: ?SNf vs CIR   Pleas Koch, MD  [facility] Pager [phone] 06/08/2011, 11:50 AM    LOS: 1 day

## 2011-06-08 NOTE — Consult Note (Signed)
Physical Medicine and Rehabilitation Consult  Reason for Consult: Nausea, unsteady gait. Referring Phsyician: Dr. Burnett Harry    HPI: Felicia Hunter is an 71 y.o. female who presents to [facility] ED with main concern of sudden onset dizziness, vertigo, unsteady gait that occurred today the day of admission. She denies similar episodes in the past and reports today she felt as was going to pass out. Her symptoms were associated with nausea but no vomiting. She denies recent sicknesses or hospitalizations, no fevers, chills, no specific abdominal or urinary concerns, no focal neurologic deficits, no headaches and no other visual changes.  CT head without acute changes. MRI brain done with acute infarct in left cerebellum. 2 D echo done revealing EF 55-65%, calcified annulus  mitral valve. Neurology recommended continuing ASA for CVA prophylaxis. Patient just returned [place] this weekend and BLE dopplers/ TCD ordered for work up. LE dopplers without DVT.  Carotid dopplers without ICA stenosis.  OT evaluation done today and patient with unsteadiness and nausea with mobility.  MD, OT recommending CIR.    Review of Systems  HENT: Negative for neck pain.   Eyes: Negative for blurred vision and double vision.  Respiratory: Positive for cough (dry).   Cardiovascular: Negative for chest pain and palpitations.  Gastrointestinal: Positive for nausea. Negative for constipation.  Genitourinary: Negative for urgency and frequency.  Musculoskeletal: Negative for myalgias.  Neurological: Negative for focal weakness and headaches.  Psychiatric/Behavioral: Negative for depression. The patient has insomnia.   All other systems reviewed and are negative.   Past Medical History  Diagnosis Date  . GERD (gastroesophageal reflux disease)   . Cancer     breast  . Arthritis   . Cough    Past Surgical History  Procedure Date  . Joint replacement july 9th, 2012    knee replacement   . Collarbone september 2011    pinned  and plated  . Breast surgery june 2011    double mastectomy   . Abdominal hysterectomy 1993   History reviewed. No pertinent family history.  Social History: Lives alone.  Independent and active. She reports that she has never smoked. She has never used smokeless tobacco. She reports that she drinks a glass of wine daily.  She reports that she does not use illicit drugs.   Allergies  Allergen Reactions  . Sulfonamide Derivatives Rash    Prior to Admission medications   Medication Sig Start Date End Date Taking? Authorizing Provider  aspirin 325 MG tablet Take 650 mg by mouth once.   Yes Historical Provider, MD  BIOTIN PO Take 1 tablet by mouth daily.   Yes Historical Provider, MD  calcium carbonate (CALCIUM 600) 600 MG TABS Take 600 mg by mouth daily.   Yes Historical Provider, MD  cholecalciferol (VITAMIN D) 400 UNITS TABS Take 1,000 Units by mouth daily.   Yes Historical Provider, MD  glucosamine-chondroitin 500-400 MG tablet Take 1 tablet by mouth 2 (two) times daily.   Yes Historical Provider, MD  Multiple Vitamin (MULITIVITAMIN WITH MINERALS) TABS Take 1 tablet by mouth daily.   Yes Historical Provider, MD  naproxen sodium (ALEVE) 220 MG tablet Take 220 mg by mouth 2 (two) times daily as needed. For pain   Yes Historical Provider, MD  omega-3 acid ethyl esters (LOVAZA) 1 G capsule Take 2 g by mouth 2 (two) times daily.   Yes Historical Provider, MD  Zolpidem Tartrate (AMBIEN PO) Take 10 mg by mouth daily as needed. For sleep   Yes  Historical Provider, MD   Scheduled Medications:    . aspirin  300 mg Rectal Daily   Or  . aspirin  325 mg Oral Daily  . calcium carbonate  1 tablet Oral Daily  . enoxaparin  40 mg Subcutaneous Q24H  . meclizine  25 mg Oral Once  . ondansetron (ZOFRAN) IV  4 mg Intravenous Once  . ondansetron  4 mg Intravenous Once  . sodium chloride  3 mL Intravenous Q12H  . zolpidem  5 mg Oral QHS  . DISCONTD: aspirin  650 mg Oral Once  . DISCONTD: calcium  carbonate  600 mg Oral Daily  . DISCONTD: enoxaparin  40 mg Subcutaneous Q24H  . DISCONTD: zolpidem  10 mg Oral QHS   PRN MED's: HYDROcodone-acetaminophen, meclizine, promethazine, senna-docusate, DISCONTD: ondansetron (ZOFRAN) IV, DISCONTD: ondansetron Home: Home Living Lives With: Alone Type of Home: House Home Layout: Two level;Able to live on main level with bedroom/bathroom Home Access: Stairs to enter Entrance Stairs-Number of Steps: 2 Bathroom Shower/Tub: Walk-in shower (first floor) Bathroom Toilet: Standard Bathroom Accessibility: Yes How Accessible: Accessible via walker Home Adaptive Equipment: Bedside commode/3-in-1;Walker - rolling;Straight cane Additional Comments: Dtr. lives ~ away and can provide assist PRN.  Pt. able  to live on first floor - reports bed is very high  Functional History: Prior Function Level of Independence: Independent with basic ADLs;Independent with homemaking with ambulation;Independent with gait;Independent with transfers Able to Take Stairs?: Reciprically Driving: Yes Vocation: Retired Functional Status:  Mobility: Bed Mobility Bed Mobility: Yes Supine to Sit: 5: Supervision;HOB elevated (Comment degrees) (30) Sit to Supine: 5: Supervision;HOB flat Transfers Sit to Stand: 4: Min assist;From bed;With upper extremity assist Stand to Sit: With upper extremity assist (min guard assist)      ADL: ADL Eating/Feeding: Simulated;Independent Where Assessed - Eating/Feeding: Bed level Grooming: Performed;Wash/dry hands;Teeth care (min guard assist) Where Assessed - Grooming: Standing at sink Upper Body Bathing: Simulated;Supervision/safety Where Assessed - Upper Body Bathing: Unsupported;Sitting, bed Lower Body Bathing: Simulated;Minimal assistance Where Assessed - Lower Body Bathing: Sit to stand from bed Upper Body Dressing: Simulated;Supervision/safety Where Assessed - Upper Body Dressing: Unsupported;Sitting, bed Lower Body  Dressing: Simulated;Minimal assistance Where Assessed - Lower Body Dressing: Sit to stand from bed Toilet Transfer: Performed;Minimal assistance Toilet Transfer Method: Ambulating Toilet Transfer Equipment: Comfort height toilet;Grab bars Toileting - Clothing Manipulation: Performed (min guard assist) Where Assessed - Glass blower/designer Manipulation: Standing Toileting - Hygiene: Performed;Supervision/safety Where Assessed - Toileting Hygiene: Sit on 3-in-1 or toilet Ambulation Related to ADLs: Pt ambulated to BR and back with min A pushing monitor.  Pt. with one episode of mild unsteadiness.  Pt. reported nausea 5/10 prior to ambulating to BR and 2/10 upon returning to bed ADL Comments: Pt. moves quickly - ?mild impulsivity.  Pt. does report she moves quickly PTA.   Pt. requires assist for ADLs due to balance deficits  Cognition: Cognition Arousal/Alertness: Awake/alert Orientation Level: Oriented X4 Cognition Arousal/Alertness: Awake/alert Overall Cognitive Status: Appears within functional limits for tasks assessed Orientation Level: Oriented X4  Blood pressure 121/72, pulse 62, temperature 98.4 F (36.9 C), temperature source Oral, resp. rate 17, height 5\' 6"  (1.676 m), weight 61.888 kg (136 lb 7 oz), SpO2 92.00%. Physical Exam  Nursing note and vitals reviewed. Constitutional: She is oriented to person, place, and time and well-developed, well-nourished, and in no distress.  HENT:  Head: Normocephalic and atraumatic.  Eyes: Pupils are equal, round, and reactive to light.  Neck: Normal range of motion. Neck supple.  Cardiovascular: Normal rate and regular rhythm.   Pulmonary/Chest: Effort normal and breath sounds normal.  Abdominal: Soft. Bowel sounds are normal.  Musculoskeletal: Normal range of motion. She exhibits no edema.  Neurological: She is alert and oriented to person, place, and time. No cranial nerve deficit. She has an abnormal Finger-Nose-Finger Test (left upper  ext) and an abnormal Heel to [employer] (left lower ext). She shows pronator drift (left).       Strength nearly symmetrical at 4+/5 bilaterally, but slightly weaker on the left.  No sensory deficits. Speech is clear.    Cognitively she's quite intact.  She's a bit anxious but very pleasant.  Skin: Skin is warm and dry.  Psychiatric: Memory, affect and judgment normal.    Results for orders placed during the hospital encounter of 06/07/11 (from the past 24 hour(s))  CBC     Status: Normal   Collection Time   06/07/11  5:16 PM      Component Value Range   WBC 9.7  4.0 - 10.5 (K/uL)   RBC 4.47  3.87 - 5.11 (MIL/uL)   Hemoglobin 14.4  12.0 - 15.0 (g/dL)   HCT 16.1  09.6 - 04.5 (%)   MCV 95.1  78.0 - 100.0 (fL)   MCH 32.2  26.0 - 34.0 (pg)   MCHC 33.9  30.0 - 36.0 (g/dL)   RDW 40.9  81.1 - 91.4 (%)   Platelets 258  150 - 400 (K/uL)  COMPREHENSIVE METABOLIC PANEL     Status: Abnormal   Collection Time   06/07/11  5:16 PM      Component Value Range   Sodium 136  135 - 145 (mEq/L)   Potassium 3.6  3.5 - 5.1 (mEq/L)   Chloride 100  96 - 112 (mEq/L)   CO2 24  19 - 32 (mEq/L)   Glucose, Bld 140 (*) 70 - 99 (mg/dL)   BUN 19  6 - 23 (mg/dL)   Creatinine, Ser 7.82  0.50 - 1.10 (mg/dL)   Calcium 95.6 (*) 8.4 - 10.5 (mg/dL)   Total Protein 7.2  6.0 - 8.3 (g/dL)   Albumin 4.4  3.5 - 5.2 (g/dL)   AST 21  0 - 37 (U/L)   ALT 15  0 - 35 (U/L)   Alkaline Phosphatase 50  39 - 117 (U/L)   Total Bilirubin 0.8  0.3 - 1.2 (mg/dL)   GFR calc non Af Amer 86 (*) >90 (mL/min)   GFR calc Af Amer >90  >90 (mL/min)  DIFFERENTIAL     Status: Abnormal   Collection Time   06/07/11  5:16 PM      Component Value Range   Neutrophils Relative 83 (*) 43 - 77 (%)   Neutro Abs 8.0 (*) 1.7 - 7.7 (K/uL)   Lymphocytes Relative 12  12 - 46 (%)   Lymphs Abs 1.1  0.7 - 4.0 (K/uL)   Monocytes Relative 4  3 - 12 (%)   Monocytes Absolute 0.4  0.1 - 1.0 (K/uL)   Eosinophils Relative 1  0 - 5 (%)   Eosinophils Absolute  0.1  0.0 - 0.7 (K/uL)   Basophils Relative 1  0 - 1 (%)   Basophils Absolute 0.1  0.0 - 0.1 (K/uL)  MAGNESIUM     Status: Normal   Collection Time   06/07/11  5:16 PM      Component Value Range   Magnesium 2.0  1.5 - 2.5 (mg/dL)  PHOSPHORUS  Status: Normal   Collection Time   06/07/11  5:16 PM      Component Value Range   Phosphorus 3.4  2.3 - 4.6 (mg/dL)  PRO B NATRIURETIC PEPTIDE     Status: Normal   Collection Time   06/07/11  5:16 PM      Component Value Range   Pro B Natriuretic peptide (BNP) 83.2  0 - 125 (pg/mL)  HEMOGLOBIN A1C     Status: Abnormal   Collection Time   06/07/11  5:16 PM      Component Value Range   Hemoglobin A1C 5.8 (*) <5.7 (%)   Mean Plasma Glucose 120 (*) <117 (mg/dL)  URINALYSIS, ROUTINE W REFLEX MICROSCOPIC     Status: Abnormal   Collection Time   06/07/11  5:22 PM      Component Value Range   Color, Urine YELLOW  YELLOW    APPearance CLEAR  CLEAR    Specific Gravity, Urine 1.020  1.005 - 1.030    pH 7.0  5.0 - 8.0    Glucose, UA NEGATIVE  NEGATIVE (mg/dL)   Hgb urine dipstick NEGATIVE  NEGATIVE    Bilirubin Urine NEGATIVE  NEGATIVE    Ketones, ur NEGATIVE  NEGATIVE (mg/dL)   Protein, ur NEGATIVE  NEGATIVE (mg/dL)   Urobilinogen, UA 0.2  0.0 - 1.0 (mg/dL)   Nitrite NEGATIVE  NEGATIVE    Leukocytes, UA TRACE (*) NEGATIVE   URINE MICROSCOPIC-ADD ON     Status: Normal   Collection Time   06/07/11  5:22 PM      Component Value Range   WBC, UA 0-2  <3 (WBC/hpf)   Bacteria, UA RARE  RARE    Urine-Other AMORPHOUS URATES/PHOSPHATES    PROTIME-INR     Status: Normal   Collection Time   06/07/11  6:46 PM      Component Value Range   Prothrombin Time 12.8  11.6 - 15.2 (seconds)   INR 0.94  0.00 - 1.49   APTT     Status: Normal   Collection Time   06/07/11  6:46 PM      Component Value Range   aPTT 27  24 - 37 (seconds)  GLUCOSE, CAPILLARY     Status: Abnormal   Collection Time   06/07/11  7:00 PM      Component Value Range   Glucose-Capillary  136 (*) 70 - 99 (mg/dL)   Comment 1 Documented in Chart     Comment 2 Notify RN    CK TOTAL AND CKMB     Status: Normal   Collection Time   06/07/11  7:15 PM      Component Value Range   Total CK 70  7 - 177 (U/L)   CK, MB 2.4  0.3 - 4.0 (ng/mL)   Relative Index RELATIVE INDEX IS INVALID  0.0 - 2.5   TROPONIN I     Status: Normal   Collection Time   06/07/11  7:15 PM      Component Value Range   Troponin I <0.30  <0.30 (ng/mL)  POCT I-STAT, CHEM 8     Status: Abnormal   Collection Time   06/07/11  7:26 PM      Component Value Range   Sodium 138  135 - 145 (mEq/L)   Potassium 3.6  3.5 - 5.1 (mEq/L)   Chloride 108  96 - 112 (mEq/L)   BUN 21  6 - 23 (mg/dL)   Creatinine, Ser 4.09  0.50 -  1.10 (mg/dL)   Glucose, Bld 478 (*) 70 - 99 (mg/dL)   Calcium, Ion 2.95  6.21 - 1.32 (mmol/L)   TCO2 24  0 - 100 (mmol/L)   Hemoglobin 15.0  12.0 - 15.0 (g/dL)   HCT 30.8  65.7 - 84.6 (%)  GLUCOSE, CAPILLARY     Status: Normal   Collection Time   06/08/11  6:40 AM      Component Value Range   Glucose-Capillary 98  70 - 99 (mg/dL)   Dg Chest 2 View  9/62/9528  *RADIOLOGY REPORT*  Clinical Data: CVA; admission chest radiograph.  CHEST - 2 VIEW  Comparison: Chest radiograph performed 12/26/2009  Findings: The lungs are well-aerated.  Minimal left basilar opacity likely reflects atelectasis.  There is no evidence of pleural effusion or pneumothorax.  The heart is normal in size; the mediastinal contour is within normal limits.  No acute osseous abnormalities are seen.  Hardware is noted along the distal right clavicle.  Degenerative change is noted at the right glenohumeral joint, with mild bony remodelling and marked sclerosis.  The lung bases are not well assessed due to overlying bra material, outside the patient.  IMPRESSION:  1.  Minimal left basilar airspace opacity likely reflects atelectasis; lungs otherwise clear.  Lung bases not well assessed due to overlying bra material, outside the patient. 2.   Degenerative change at the right glenohumeral joint.  Original Report Authenticated By: Tonia Ghent, M.D.   Ct Head Wo Contrast  06/07/2011  *RADIOLOGY REPORT*  Clinical Data: Nausea and emesis.  Dizziness  CT HEAD WITHOUT CONTRAST  Technique:  Contiguous axial images were obtained from the base of the skull through the vertex without contrast.  Comparison: None  Findings: The brain has a normal appearance without evidence for hemorrhage, infarction, hydrocephalus, or mass lesion.  There is no extra axial fluid collection.  The skull and paranasal sinuses are normal.  IMPRESSION: Negative exam.  Original Report Authenticated By: Rosealee Albee, M.D.   Mr [facility] Head Wo Contrast  06/08/2011  *RADIOLOGY REPORT*  Clinical Data: Left cerebellar infarcts.  Dizziness and nausea with vomiting.  MRA HEAD WITHOUT CONTRAST  Technique: Angiographic images of the Circle of Willis were obtained using MRA technique without intravenous contrast.  Comparison: 06/07/2011 MR brain.  Findings: In this patient with mid to inferior left cerebellar infarcts, there is an abnormal appearance of the left vertebral artery with marked focal stenosis at the expected takeoff of the left PICA.  The left PICA is not visualized.  Mild irregularity of the basilar artery.  Nonvisualization right AICA.  Moderate irregularity and narrowing of the left AICA.  Mild to moderate narrowing irregularity superior cerebellar artery bilaterally.  Mild irregularity posterior cerebral artery distal branches bilaterally.  Ectatic vertical cervical segment of the internal carotid artery bilaterally with narrowing on the right.  Anterior circulation without medium or large size vessel significant stenosis or occlusion.  Minimal bulge along the superior margin of the M1 segment of the right middle cerebral artery without discrete aneurysm noted.  Mild irregularity middle cerebral artery distal branches.  Fetal type origin of the left posterior cerebral  artery.  No aneurysm or vascular formation noted.  IMPRESSION: In this patient with mid to inferior left cerebellar infarcts, there is an abnormal appearance of the left vertebral artery with marked focal stenosis at the expected takeoff of the left PICA. The left PICA is not visualized.  Original Report Authenticated By: Fuller Canada, M.D.   Mr  Brain Wo Contrast  06/07/2011  *RADIOLOGY REPORT*  Clinical Data: Near syncope.  Nausea and vomiting and dizziness  MRI HEAD WITHOUT CONTRAST  Technique:  Multiplanar, multiecho pulse sequences of the brain and surrounding structures were obtained according to standard protocol without intravenous contrast.  Comparison: CT 06/07/2011  Findings: Patchy areas of restricted diffusion in the left mid and inferior cerebellum compatible with acute infarct.  No other areas of acute infarct.  Small areas of chronic infarct in the frontal white matter bilaterally.  Negative for hemorrhage or mass. Ventricle size is normal.  No midline shift.  Paranasal sinuses are clear.  IMPRESSION: Patchy areas of acute infarct in the left cerebellum.  Original Report Authenticated By: Camelia Phenes, M.D.    Assessment/Plan: Diagnosis: left cerebellar infarct 1. Does the need for close, 24 hr/day medical supervision in concert with the patient's rehab needs make it unreasonable for this patient to be served in a less intensive setting? Yes 2. Co-Morbidities requiring supervision/potential complications: asthma, depression 3. Due to bladder management, bowel management, safety, skin/wound care, disease management, medication administration, pain management and patient education, does the patient require 24 hr/day rehab nursing? Yes 4. Does the patient require coordinated care of a physician, rehab nurse, PT (1-2 hrs/day, 5 days/week) and OT (1-2 hrs/day, 5 days/week) to address physical and functional deficits in the context of the above medical diagnosis(es)? Yes Addressing deficits  in the following areas: balance, endurance, locomotion, strength, transferring, bowel/bladder control, bathing, dressing, feeding, grooming and toileting 5. Can the patient actively participate in an intensive therapy program of at least 3 hrs of therapy per day at least 5 days per week? Yes 6. The potential for patient to make measurable gains while on inpatient rehab is excellent 7. Anticipated functional outcomes upon discharge from inpatients are Modified independent PT, modified independent OT,  8. Estimated rehab length of stay to reach the above functional goals is: 7 days 9. Does the patient have adequate social supports to accommodate these discharge functional goals? Yes 10. Anticipated D/C setting: Home 11. Anticipated post D/C treatments: HH therapy 12. Overall Rehab/Functional Prognosis: excellent  RECOMMENDATIONS: This patient's condition is appropriate for continued rehabilitative care in the following setting: CIR Patient has agreed to participate in recommended program. Yes Note that insurance prior authorization may be required for reimbursement for recommended care.  Comment: Excellent rehab candidate. Should reach Modified independent goals   Ivory Broad, MD 06/08/2011

## 2011-06-08 NOTE — Evaluation (Signed)
Occupational Therapy Evaluation Patient Details Name: Felicia Hunter MRN: [id number] DOB: 07-Dec-1940 Today's Date: 06/08/2011  Problem List:  Patient Active Problem List  Diagnoses  . DEPRESSION  . ALLERGIC RHINITIS  . ASTHMA  . breast cancer, right DCIS s/p R mastectomy 08/20/09  . Near syncope  . CVA (cerebral infarction)-mid/inf cerebellar infarct 3/25    Past Medical History:  Past Medical History  Diagnosis Date  . GERD (gastroesophageal reflux disease)   . Cancer     breast  . Arthritis   . Cough    Past Surgical History:  Past Surgical History  Procedure Date  . Joint replacement july 9th, 2012    knee replacement   . Collarbone september 2011    pinned and plated  . Breast surgery june 2011    double mastectomy   . Abdominal hysterectomy 1993    OT Assessment/Plan/Recommendation OT Assessment Clinical Impression Statement: This 71 y.o. female admitted with acute cerebellar CVA.  Pt. demonstrates the below listed deficits.  Per pt. report, pt. with significant improvement with mobility and function compared to yesterday.  Pt. currently is requiring min A with basic self care activities due to impiared balance, pt, however, is at risk of falling.  Pt. lives alone and dtr able to provide assistance if necessary.  Feel with CIR, pt. could discharge home at modified independent level with reduced risk of falling.   OT Recommendation/Assessment: Patient will need skilled OT in the acute care venue OT Problem List: Decreased activity tolerance;Impaired balance (sitting and/or standing);Decreased knowledge of use of DME or AE Barriers to Discharge: None OT Therapy Diagnosis : Generalized weakness OT Plan OT Frequency: Min 2X/week OT Treatment/Interventions: Self-care/ADL training;Neuromuscular education;Therapeutic activities;Patient/family education;Balance training OT Recommendation Recommendations for Other Services: Rehab consult Follow Up Recommendations:  Supervision/Assistance - 24 hour;Inpatient Rehab Equipment Recommended: None recommended by OT Individuals Consulted Consulted and Agree with Results and Recommendations: Patient OT Goals Acute Rehab OT Goals OT Goal Formulation: With patient Time For Goal Achievement: 2 weeks ADL Goals Pt Will Perform Grooming: Standing at sink;with modified independence ADL Goal: Grooming - Progress: Goal set today Pt Will Perform Upper Body Bathing: Standing at sink;with modified independence ADL Goal: Upper Body Bathing - Progress: Goal set today Pt Will Perform Lower Body Bathing: with modified independence;Sit to stand from chair ADL Goal: Lower Body Bathing - Progress: Goal set today Pt Will Perform Lower Body Dressing: with modified independence;Sit to stand from chair;Sit to stand from bed ADL Goal: Lower Body Dressing - Progress: Goal set today Pt Will Transfer to Toilet: with modified independence;Ambulation;Comfort height toilet ADL Goal: Toilet Transfer - Progress: Goal set today Pt Will Perform Toileting - Clothing Manipulation: with modified independence;Standing ADL Goal: Toileting - Clothing Manipulation - Progress: Goal set today Pt Will Perform Tub/Shower Transfer: Shower transfer;with supervision;Ambulation;with DME (3-in-1) ADL Goal: Tub/Shower Transfer - Progress: Goal set today Additional ADL Goal #1: Pt. will perform path finding activity with min guard assist ADL Goal: Additional Goal #1 - Progress: Goal set today  OT Evaluation Precautions/Restrictions  Precautions Precautions: Fall Restrictions Weight Bearing Restrictions: No Prior Functioning Home Living Lives With: Alone Type of Home: House Home Layout: Two level;Able to live on main level with bedroom/bathroom Home Access: Stairs to enter Entrance Stairs-Number of Steps: 2 Bathroom Shower/Tub: Walk-in shower (first floor) Bathroom Toilet: Standard Bathroom Accessibility: Yes How Accessible: Accessible via  walker Home Adaptive Equipment: Bedside commode/3-in-1;Walker - rolling;Straight cane Additional Comments: Dtr. lives ~ away and can provide assist  PRN.  Pt. able  to live on first floor - reports bed is very high Prior Function Level of Independence: Independent with basic ADLs;Independent with homemaking with ambulation;Independent with gait;Independent with transfers Able to Take Stairs?: Reciprically Driving: Yes Vocation: Retired ADL ADL Eating/Feeding: Simulated;Independent Where Assessed - Eating/Feeding: Bed level Grooming: Performed;Wash/dry hands;Teeth care (min guard assist) Where Assessed - Grooming: Standing at sink Upper Body Bathing: Simulated;Supervision/safety Where Assessed - Upper Body Bathing: Unsupported;Sitting, bed Lower Body Bathing: Simulated;Minimal assistance Where Assessed - Lower Body Bathing: Sit to stand from bed Upper Body Dressing: Simulated;Supervision/safety Where Assessed - Upper Body Dressing: Unsupported;Sitting, bed Lower Body Dressing: Simulated;Minimal assistance Where Assessed - Lower Body Dressing: Sit to stand from bed Toilet Transfer: Performed;Minimal assistance Toilet Transfer Method: Ambulating Toilet Transfer Equipment: Comfort height toilet;Grab bars Toileting - Clothing Manipulation: Performed (min guard assist) Where Assessed - Glass blower/designer Manipulation: Standing Toileting - Hygiene: Performed;Supervision/safety Where Assessed - Toileting Hygiene: Sit on 3-in-1 or toilet Ambulation Related to ADLs: Pt ambulated to BR and back with min A pushing monitor.  Pt. with one episode of mild unsteadiness.  Pt. reported nausea 5/10 prior to ambulating to BR and 2/10 upon returning to bed ADL Comments: Pt. moves quickly - ?mild impulsivity.  Pt. does report she moves quickly PTA.   Pt. requires assist for ADLs due to balance deficits Vision/Perception  Vision - History Baseline Vision: Bifocals Patient Visual Report: No change  from baseline Vision - Assessment Eye Alignment: Within Functional Limits Vision Assessment: Vision tested Ocular Range of Motion: Within Functional Limits Tracking/Visual Pursuits: Able to track stimulus in all quads without difficulty Saccades: Within functional limits Visual Fields: No apparent deficits Additional Comments: Unable to evoke dizziness with any visual testing or gaze stabilization with pt. supine Perception Perception: Within Functional Limits Praxis Praxis: Intact Cognition Cognition Arousal/Alertness: Awake/alert Overall Cognitive Status: Appears within functional limits for tasks assessed Orientation Level: Oriented X4 Sensation/Coordination Sensation Light Touch: Appears Intact (pt. denies numbness/tingling) Stereognosis: Appears Intact Proprioception: Appears Intact Coordination Gross Motor Movements are Fluid and Coordinated: Yes Fine Motor Movements are Fluid and Coordinated: Yes Extremity Assessment RUE Assessment RUE Assessment: Exceptions to [facility] RUE AROM (degrees) Overall AROM Right Upper Extremity: Due to premorbid status (arthritis Rt. shoulder) LUE Assessment LUE Assessment: Within Functional Limits Mobility  Bed Mobility Bed Mobility: Yes Supine to Sit: 5: Supervision;HOB elevated (Comment degrees) (30) Sit to Supine: 5: Supervision;HOB flat Transfers Transfers: Yes Sit to Stand: 4: Min assist;From bed;With upper extremity assist Stand to Sit: With upper extremity assist (min guard assist) Exercises   End of Session OT - End of Session Activity Tolerance: Patient tolerated treatment well Patient left: in bed;with call bell in reach;with bed alarm set Nurse Communication: Mobility status for transfers General Behavior During Session: [facility] for tasks performed Cognition: [facility] for tasks performed   Itxel Wickard M 06/08/2011, 11:58 AM

## 2011-06-08 NOTE — Evaluation (Signed)
Physical Therapy Evaluation Patient Details Name: Felicia Hunter MRN: [id number] DOB: September 05, 1940 Today's Date: 06/08/2011  Problem List:  Patient Active Problem List  Diagnoses  . DEPRESSION  . ALLERGIC RHINITIS  . ASTHMA  . breast cancer, right DCIS s/p R mastectomy 08/20/09  . Near syncope  . CVA (cerebral infarction)-mid/inf cerebellar infarct 3/25    Past Medical History:  Past Medical History  Diagnosis Date  . GERD (gastroesophageal reflux disease)   . Cancer     breast  . Arthritis   . Cough    Past Surgical History:  Past Surgical History  Procedure Date  . Joint replacement july 9th, 2012    left knee replacement   . Collarbone september 2011    pinned and plated  . Breast surgery june 2011    double mastectomy   . Abdominal hysterectomy 1993    PT Assessment/Plan/Recommendation PT Assessment Clinical Impression Statement: Pt is a 71 y.o. female s/p L cerebellar stroke with resulting dizziness, nausea, impaired mobility, impaired gait, and decreased balance.  Patient will benefit from skilled physical therapy in the acute setting to address these impairments.   Pt scored 13/24 on DGI scoring pt has high risk falls.  Patient will benefit from inpatient rehab in order to reach modified independence. PT Recommendation/Assessment: Patient will need skilled PT in the acute care venue PT Problem List: Decreased activity tolerance;Decreased balance;Decreased mobility;Decreased coordination;Decreased knowledge of use of DME;Decreased safety awareness;Decreased knowledge of precautions Barriers to Discharge: Decreased caregiver support PT Therapy Diagnosis : Difficulty walking;Abnormality of gait PT Plan PT Frequency: Min 4X/week PT Treatment/Interventions: DME instruction;Gait training;Stair training;Functional mobility training;Therapeutic activities;Balance training;Neuromuscular re-education;Patient/family education PT Recommendation Recommendations for Other  Services: Rehab consult Follow Up Recommendations: Inpatient Rehab Equipment Recommended: Defer to next venue PT Goals  Acute Rehab PT Goals PT Goal Formulation: With patient Time For Goal Achievement: 7 days Pt will go Supine/Side to Sit: Independently PT Goal: Supine/Side to Sit - Progress: Goal set today Pt will go Sit to Stand: with supervision PT Goal: Sit to Stand - Progress: Goal set today Pt will go Stand to Sit: with supervision PT Goal: Stand to Sit - Progress: Goal set today Pt will Ambulate: >150 feet;with supervision;with least restrictive assistive device PT Goal: Ambulate - Progress: Goal set today Pt will Go Up / Down Stairs: 3-5 stairs;with supervision;with rail(s) PT Goal: Up/Down Stairs - Progress: Goal set today Additional Goals Additional Goal #1: Patient will improve her DGI by 5 points in order to demonstrate decreased fall risk with ambulation. PT Goal: Additional Goal #1 - Progress: Goal set today  PT Evaluation Precautions/Restrictions  Precautions Precautions: Fall Restrictions Weight Bearing Restrictions: No Prior Functioning  Home Living Lives With: Alone Type of Home: House Home Layout: Two level;Able to live on main level with bedroom/bathroom Home Access: Stairs to enter Entrance Stairs-Number of Steps: 2 Bathroom Shower/Tub: Walk-in shower (first floor) Bathroom Toilet: Standard Bathroom Accessibility: Yes How Accessible: Accessible via walker Home Adaptive Equipment: Bedside commode/3-in-1;Walker - rolling;Straight cane Additional Comments: Dtr. lives ~ away and can provide assist PRN or 24 hour assist.  Pt. able  to live on first floor - reports bed is very high Prior Function Level of Independence: Independent with basic ADLs;Independent with homemaking with ambulation;Independent with gait;Independent with transfers Able to Take Stairs?: Reciprically Driving: Yes Vocation: Retired Financial risk analyst Arousal/Alertness:  Awake/alert Overall Cognitive Status: Appears within functional limits for tasks assessed Orientation Level: Oriented X4 Sensation/Coordination Sensation Light Touch: Appears Intact (pt. denies  numbness/tingling) Stereognosis: Appears Intact Proprioception: Appears Intact Coordination Gross Motor Movements are Fluid and Coordinated: Yes Fine Motor Movements are Fluid and Coordinated: Yes Finger Nose Finger Test: Patient exhibited slight under/overshooting with LUE. Heel Shin Test: Patient able to perform with both LE, however slightly less corrdinated with LLE.  Patient reported that is was more difficult with LLE. Extremity Assessment RUE Assessment RUE Assessment: Exceptions to [facility] RUE AROM (degrees) Overall AROM Right Upper Extremity: Due to premorbid status (arthritis Rt. shoulder) LUE Assessment LUE Assessment: Within Functional Limits RLE Assessment RLE Assessment: Within Functional Limits LLE Assessment LLE Assessment: Within Functional Limits Mobility (including Balance) Bed Mobility Bed Mobility: Yes Supine to Sit: 5: Supervision;HOB elevated (Comment degrees) (30) Supine to Sit Details (indicate cue type and reason): Pt required supervision for safety Sit to Supine: 5: Supervision;HOB flat Transfers Transfers: Yes Sit to Stand: 4: Min assist;From bed;With upper extremity assist Sit to Stand Details (indicate cue type and reason): Pt required min assist due to nausea and dizziness. Stand to Sit: With upper extremity assist;With armrests;To chair/3-in-1 (min guard assist) Ambulation/Gait Ambulation/Gait: Yes Ambulation/Gait Assistance: 4: Min assist Ambulation/Gait Assistance Details (indicate cue type and reason): Pt required min assist for safety secondary to nausea and performing turns too quickly.  As well as occasional ataxic gait impairing overall gait and balance. Ambulation Distance (Feet): 200 Feet Assistive device: Rolling walker Gait Pattern: Step-to  pattern;Decreased step length - left Stairs: Yes Stairs Assistance: 4: Min assist Stairs Assistance Details (indicate cue type and reason): Min assist for safety and VC for sequencing. Stair Management Technique: Two rails;Step to pattern Number of Stairs: 2   Posture/Postural Control Posture/Postural Control: No significant limitations Balance Balance Assessed: Yes Static Standing Balance Static Standing - Balance Support: Bilateral upper extremity supported Static Standing - Level of Assistance: Other (comment) (Min guard) Static Standing - Comment/# of Minutes: 5 minutes.  Pt required min guard for safety with static standing Dynamic Gait Index Level Surface: Mild Impairment Change in Gait Speed: Mild Impairment Gait with Horizontal Head Turns: Mild Impairment Gait with Vertical Head Turns: Mild Impairment Gait and Pivot Turn: Moderate Impairment Step Over Obstacle: Moderate Impairment Step Around Obstacles: Mild Impairment Steps: Moderate Impairment Total Score: 13  Exercise    End of Session PT - End of Session Equipment Utilized During Treatment: Gait belt Activity Tolerance: Treatment limited secondary to medical complications (Comment) (Nausea and dizziness) Patient left: in chair;with call bell in reach General Behavior During Session: [facility] for tasks performed Cognition: [facility] for tasks performed  Ezzard Standing SPT 06/08/2011, 2:11 PM [place], PT DPT [phone]

## 2011-06-08 NOTE — Progress Notes (Signed)
I met with patient at bedside. Patient would benefit form an inpatient acute rehab stay prior to d/c home. We can admit tomorrow if workup complete. Patient in agreement. Please call with any questions. Pager [phone]

## 2011-06-08 NOTE — Progress Notes (Signed)
   CARE MANAGEMENT NOTE 06/08/2011  Patient:  BELLE, CHARLIE   Account Number:  [id number]  Date Initiated:  06/08/2011  Documentation initiated by:  Onnie Boer  Subjective/Objective Assessment:   PT WAS ADMITTED WITH BLURRED VISION AND DIZZINESS     Action/Plan:   PROGRESSION OF CARE AND DISCHARGE PLANNING   Anticipated DC Date:  06/11/2011   Anticipated DC Plan:  HOME W HOME HEALTH SERVICES      DC Planning Services  CM consult      Choice offered to / List presented to:             Status of service:  In process, will continue to follow Medicare Important Message given?   (If response is "NO", the following Medicare IM given date fields will be blank) Date Medicare IM given:   Date Additional Medicare IM given:    Discharge Disposition:    Per UR Regulation:  Reviewed for med. necessity/level of care/duration of stay  If discussed at Long Length of Stay Meetings, dates discussed:    Comments:  PCP:  Dr. Merri Brunette  06/08/2011  11:30 am Darlyne Russian RN, CCM  [phone] Met with patient to discuss discharge planning. She lives alone in 2 story home, her daughter and granddaughters are nearby.   CM to continue to follow for discharge planning needs.   06/08/11 Onnie Boer, RN, BSN 1109 UR COMPLETED

## 2011-06-08 NOTE — Progress Notes (Signed)
Stroke Team Progress Note  HISTORY Felicia Hunter is an 71 y.o. female was initially evaluated in the emergency room at [facility] 06/07/2011 following acute onset of dizziness and nausea as well as unsteady gait. Patient has no previous history of stroke nor TIA. She has not been on antiplatelet therapy. Onset of symptoms was about 1530 on 06/07/2011. CT scan of her head showed no acute intracranial abnormality. MRI study showed findings consistent with multiple patchy small areas of ischemic infarction involving the left cerebellum. NIH stroke score was 0.  SUBJECTIVE No family is at the bedside. Overall she feels her condition is unchanged. She feels queezy when she sits up.No prior h/o stroke or TIA.   OBJECTIVE Filed Vitals:   06/08/11 0047 06/08/11 0235 06/08/11 0435 06/08/11 0635  BP:  119/57 106/65 107/53  Pulse:  64 64 63  Temp:  98.2 F (36.8 C) 98.3 F (36.8 C) 98.2 F (36.8 C)  TempSrc:      Resp:  16 16 16   Height: 5\' 6"  (1.676 m)     Weight: 61.888 kg (136 lb 7 oz)     SpO2:  94% 95% 93%   CBG (last 3)  Basename 06/08/11 0640 06/07/11 1900  GLUCAP 98 136*   Intake/Output from previous day: 03/25 0701 - 03/26 0700 In: 561.3 [I.V.:561.3] Out: 1200 [Urine:1200]  IV Fluid Intake:     . sodium chloride 75 mL/hr at 06/08/11 0700   Medications    . aspirin  300 mg Rectal Daily   Or  . aspirin  325 mg Oral Daily  . calcium carbonate  600 mg Oral Daily  . enoxaparin  40 mg Subcutaneous Q24H  . meclizine  25 mg Oral Once  . ondansetron (ZOFRAN) IV  4 mg Intravenous Once  . ondansetron  4 mg Intravenous Once  . sodium chloride  3 mL Intravenous Q12H  . zolpidem  5 mg Oral QHS  . DISCONTD: aspirin  650 mg Oral Once  . DISCONTD: enoxaparin  40 mg Subcutaneous Q24H  . DISCONTD: zolpidem  10 mg Oral QHS  PRN HYDROcodone-acetaminophen, meclizine, promethazine, senna-docusate, DISCONTD: ondansetron (ZOFRAN) IV, DISCONTD: ondansetron  Diet:  Cardiac  thin liquids Activity:  Bathroom privileges DVT Prophylaxis:  Lovenox 40 mg sq daily   Significant Diagnostic Studies: CBC    Component Value Date/Time   WBC 9.7 06/07/2011 1716   WBC 5.8 07/28/2009 1504   RBC 4.47 06/07/2011 1716   HGB 15.0 06/07/2011 1926   HGB 13.4 07/28/2009 1504   HCT 44.0 06/07/2011 1926   HCT 39.4 07/28/2009 1504   PLT 258 06/07/2011 1716   PLT 254 07/28/2009 1504   MCV 95.1 06/07/2011 1716   MCV 94 07/28/2009 1504   MCH 32.2 06/07/2011 1716   MCH 31.8 07/28/2009 1504   MCHC 33.9 06/07/2011 1716   MCHC 34.0 07/28/2009 1504   RDW 12.9 06/07/2011 1716   RDW 10.4* 07/28/2009 1504   LYMPHSABS 1.1 06/07/2011 1716   LYMPHSABS 1.8 07/28/2009 1504   MONOABS 0.4 06/07/2011 1716   EOSABS 0.1 06/07/2011 1716   EOSABS 0.1 07/28/2009 1504   BASOSABS 0.1 06/07/2011 1716   BASOSABS 0.0 07/28/2009 1504   CMP    Component Value Date/Time   NA 138 06/07/2011 1926   K 3.6 06/07/2011 1926   CL 108 06/07/2011 1926   CO2 24 06/07/2011 1716   GLUCOSE 142* 06/07/2011 1926   BUN 21 06/07/2011 1926   CREATININE 0.80 06/07/2011 1926  CALCIUM 10.7* 06/07/2011 1716   PROT 7.2 06/07/2011 1716   ALBUMIN 4.4 06/07/2011 1716   AST 21 06/07/2011 1716   ALT 15 06/07/2011 1716   ALKPHOS 50 06/07/2011 1716   BILITOT 0.8 06/07/2011 1716   GFRNONAA 86* 06/07/2011 1716   GFRAA >90 06/07/2011 1716   COAGS Lab Results  Component Value Date   INR 0.94 06/07/2011   INR 1.00 09/15/2010   INR 0.92 11/24/2009   Lipid Panel No results found for this basename: chol, trig, hdl, cholhdl, vldl, ldlcalc   HgbA1C  Lab Results  Component Value Date   HGBA1C 5.8* 06/07/2011   Urine Drug Screen  No results found for this basename: labopia, cocainscrnur, labbenz, amphetmu, thcu, labbarb    Alcohol Level No results found for this basename: eth   CT of the brain  06/07/2011 Negative exam  MRI of the brain  06/07/2011   Patchy areas of acute infarct in the left cerebellum.  MRA of the brain  Not ordered   2D  Echocardiogram  performed  Carotid Doppler  ordered   CXR  06/07/2011  1.  Minimal left basilar airspace opacity likely reflects atelectasis; lungs otherwise clear.  Lung bases not well assessed due to overlying bra material, outside the patient. 2.  Degenerative change at the right glenohumeral joint.    EKG  normal sinus rhythm, PAC's noted.   Physical Exam    Pleasant elderly caucasian lady not in distress. Afebrile. Cardiac exam no murmur or gallop.Lungs clear to auscultation.distal pulses well felt. Neurological exam ; Awake  Alert oriented x 3. Normal speech and language.eye movements full without nystagmus. Face symmetric. Tongue midline. Normal strength, tone, reflexes and coordination. Normal sensation. Gait deferred as complains of truncal ataxia and dizziness   ASSESSMENT [name] is a 71 y.o. female with a patchy left cerebellar infacts, secondary to unknown source - likely small vessel disease due to location in white matter. workup pending. Not on antiplatelets prior admission. Now on aspirin 325 mg orally every day for secondary stroke prevention. Patient with resultant syncope, balance disturbance, nausea . Pt just completed a long drive from [place].  Hospital day # 1  TREATMENT/PLAN - Continue aspirin 325 mg orally every day for secondary stroke prevention. - follow up Carotid -add TCD and LE venous dopplers for ? VTE -OOB -therapy evals  SHARON BIBY, AVNP, ANP-BC, GNP-BC [facility] Pager: [phone] 06/08/2011 8:34 AM  Dr. Delia Heady, Stroke Center Medical Director, has personally reviewed chart, pertinent data, examined the patient and developed the plan of care.

## 2011-06-08 NOTE — Consult Note (Addendum)
Chief Complaint: Acute onset of dizziness and nausea, as well as unsteady gait  HPI: Felicia Hunter is an 71 y.o. female was initially evaluated in the emergency room at [facility] following acute onset of dizziness and nausea as well as unsteady gait. Patient has no previous history of stroke nor TIA. She has not been on antiplatelet therapy. Onset of symptoms was about 1530 on 06/07/2011. CT scan of her head showed no acute intracranial abnormality. MRI study showed findings consistent with multiple patchy small areas of ischemic infarction involving the left cerebellum. NIH stroke score was 0.  LSN: 1530 on 06/07/2011 tPA Given: No: Minimal symptomatology, and beyond time window for treatment. MRankin: 0  Past Medical History  Diagnosis Date  . GERD (gastroesophageal reflux disease)   . Cancer     breast  . Arthritis   . Cough     History reviewed. No pertinent family history.   Medications:  Scheduled:   . aspirin  300 mg Rectal Daily   Or  . aspirin  325 mg Oral Daily  . calcium carbonate  600 mg Oral Daily  . enoxaparin  40 mg Subcutaneous Q24H  . meclizine  25 mg Oral Once  . ondansetron (ZOFRAN) IV  4 mg Intravenous Once  . ondansetron  4 mg Intravenous Once  . sodium chloride  3 mL Intravenous Q12H  . zolpidem  5 mg Oral QHS  . DISCONTD: aspirin  650 mg Oral Once  . DISCONTD: enoxaparin  40 mg Subcutaneous Q24H  . DISCONTD: zolpidem  10 mg Oral QHS     Physical Examination: Blood pressure 147/88, pulse 81, temperature 97.6 F (36.4 C), temperature source Oral, resp. rate 16, height 5\' 6"  (1.676 m), weight 61.888 kg (136 lb 7 oz), SpO2 93.00%.  Neurologic Examination: Mental Status: Alert, oriented, thought content appropriate.  Speech fluent without evidence of aphasia. Able to follow commands without difficulty. Cranial Nerves: II-Visual fields were normal. III/IV/VI-Extraocular movements full and conjugate no nystagmus.  Pupils reacted  normally to light. V/VII-no facial numbness and no facial weakness VIII-grossly intact X-normal speech XII-midline tongue extension Motor: 5/5 bilaterally with normal tone and bulk Sensory: light touch intact throughout, bilaterally Deep Tendon Reflexes: 2+ and symmetric throughout Plantars: Downgoing bilaterally Cerebellar: Normal finger-to-nose  Dg Chest 2 View  06/07/2011  *RADIOLOGY REPORT*  Clinical Data: CVA; admission chest radiograph.  CHEST - 2 VIEW  Comparison: Chest radiograph performed 12/26/2009  Findings: The lungs are well-aerated.  Minimal left basilar opacity likely reflects atelectasis.  There is no evidence of pleural effusion or pneumothorax.  The heart is normal in size; the mediastinal contour is within normal limits.  No acute osseous abnormalities are seen.  Hardware is noted along the distal right clavicle.  Degenerative change is noted at the right glenohumeral joint, with mild bony remodelling and marked sclerosis.  The lung bases are not well assessed due to overlying bra material, outside the patient.  IMPRESSION:  1.  Minimal left basilar airspace opacity likely reflects atelectasis; lungs otherwise clear.  Lung bases not well assessed due to overlying bra material, outside the patient. 2.  Degenerative change at the right glenohumeral joint.  Original Report Authenticated By: Tonia Ghent, M.D.   Ct Head Wo Contrast  06/07/2011  *RADIOLOGY REPORT*  Clinical Data: Nausea and emesis.  Dizziness  CT HEAD WITHOUT CONTRAST  Technique:  Contiguous axial images were obtained from the base of the skull through the vertex without contrast.  Comparison: None  Findings: The brain has a normal appearance without evidence for hemorrhage, infarction, hydrocephalus, or mass lesion.  There is no extra axial fluid collection.  The skull and paranasal sinuses are normal.  IMPRESSION: Negative exam.  Original Report Authenticated By: Rosealee Albee, M.D.   Mr Brain Wo  Contrast  06/07/2011  *RADIOLOGY REPORT*  Clinical Data: Near syncope.  Nausea and vomiting and dizziness  MRI HEAD WITHOUT CONTRAST  Technique:  Multiplanar, multiecho pulse sequences of the brain and surrounding structures were obtained according to standard protocol without intravenous contrast.  Comparison: CT 06/07/2011  Findings: Patchy areas of restricted diffusion in the left mid and inferior cerebellum compatible with acute infarct.  No other areas of acute infarct.  Small areas of chronic infarct in the frontal white matter bilaterally.  Negative for hemorrhage or mass. Ventricle size is normal.  No midline shift.  Paranasal sinuses are clear.  IMPRESSION: Patchy areas of acute infarct in the left cerebellum.  Original Report Authenticated By: Camelia Phenes, M.D.    Assessment: 71 y.o. female acute patchy areas of ischemic infarction involving the left cerebellum.  Stroke Risk Factors - none  Plan: 1. HgbA1c, fasting lipid panel 2. MRA  of the brain without contrast 3. PT consult, OT consult, Speech consult 4. Echocardiogram 5. Carotid dopplers 6. Prophylactic therapy-Antiplatelet med: Aspirin - dose 325 mg per day 7. Risk factor modification 8. Telemetry monitoring  C.R. Roseanne Reno, MD [facility] [phone]  06/08/2011, 2:03 AM   Addendum: Patient was transferred from hospitalist service and was in long hospital for management of the stroke service at [facility].

## 2011-06-08 NOTE — Progress Notes (Signed)
*  PRELIMINARY RESULTS* Vascular Ultrasound Carotid Duplex (Doppler) has been completed.  Preliminary findings: No significant ICA stenosis with antegrade vertebral flow.  Lower extremity venous duplex has been completed. Preliminary findings: No evidence of DVT or baker's cyst.  Farrel Demark RDMS 06/08/2011, 12:26 PM

## 2011-06-08 NOTE — Progress Notes (Signed)
  Echocardiogram 2D Echocardiogram has been performed.  Fread Kottke L 06/08/2011, 9:10 AM

## 2011-06-09 ENCOUNTER — Inpatient Hospital Stay (HOSPITAL_COMMUNITY)
Admission: RE | Admit: 2011-06-09 | Discharge: 2011-06-10 | DRG: 945 | Disposition: A | Discharge status: Home / Self Care | Payer: Medicare Other | Source: Ambulatory Visit | Attending: Physical Medicine & Rehabilitation | Admitting: Physical Medicine & Rehabilitation

## 2011-06-09 DIAGNOSIS — I639 Cerebral infarction, unspecified: Secondary | ICD-10-CM

## 2011-06-09 DIAGNOSIS — Z853 Personal history of malignant neoplasm of breast: Secondary | ICD-10-CM

## 2011-06-09 DIAGNOSIS — Z96659 Presence of unspecified artificial knee joint: Secondary | ICD-10-CM | POA: Diagnosis not present

## 2011-06-09 DIAGNOSIS — R51 Headache: Secondary | ICD-10-CM

## 2011-06-09 DIAGNOSIS — I6350 Cerebral infarction due to unspecified occlusion or stenosis of unspecified cerebral artery: Secondary | ICD-10-CM | POA: Diagnosis not present

## 2011-06-09 DIAGNOSIS — R7989 Other specified abnormal findings of blood chemistry: Secondary | ICD-10-CM | POA: Diagnosis not present

## 2011-06-09 DIAGNOSIS — Z5189 Encounter for other specified aftercare: Secondary | ICD-10-CM

## 2011-06-09 DIAGNOSIS — E785 Hyperlipidemia, unspecified: Secondary | ICD-10-CM

## 2011-06-09 DIAGNOSIS — R55 Syncope and collapse: Secondary | ICD-10-CM | POA: Diagnosis not present

## 2011-06-09 DIAGNOSIS — R110 Nausea: Secondary | ICD-10-CM

## 2011-06-09 DIAGNOSIS — I6340 Cerebral infarction due to embolism of unspecified cerebral artery: Secondary | ICD-10-CM | POA: Diagnosis not present

## 2011-06-09 DIAGNOSIS — R269 Unspecified abnormalities of gait and mobility: Secondary | ICD-10-CM | POA: Diagnosis not present

## 2011-06-09 DIAGNOSIS — K219 Gastro-esophageal reflux disease without esophagitis: Secondary | ICD-10-CM

## 2011-06-09 DIAGNOSIS — I6330 Cerebral infarction due to thrombosis of unspecified cerebral artery: Secondary | ICD-10-CM

## 2011-06-09 LAB — LIPID PANEL
Cholesterol: 213 mg/dL — ABNORMAL HIGH (ref 0–200)
Total CHOL/HDL Ratio: 2.6 RATIO
VLDL: 24 mg/dL (ref 0–40)

## 2011-06-09 MED ORDER — PROCHLORPERAZINE MALEATE 5 MG PO TABS
5.0000 mg | ORAL_TABLET | Freq: Four times a day (QID) | ORAL | Status: DC | PRN
  Filled 2011-06-09: qty 2

## 2011-06-09 MED ORDER — HYDROCODONE-ACETAMINOPHEN 5-325 MG PO TABS: 1.0000 | ORAL_TABLET | ORAL | Status: DC | PRN

## 2011-06-09 MED ORDER — GUAIFENESIN-DM 100-10 MG/5ML PO SYRP: 5.0000 mL | ORAL_SOLUTION | Freq: Four times a day (QID) | ORAL | Status: DC | PRN

## 2011-06-09 MED ORDER — POLYETHYLENE GLYCOL 3350 17 G PO PACK
17.0000 g | PACK | Freq: Every day | ORAL | Status: DC | PRN
  Filled 2011-06-09: qty 1

## 2011-06-09 MED ORDER — BISACODYL 10 MG RE SUPP: 10.0000 mg | Freq: Every day | RECTAL | Status: DC | PRN

## 2011-06-09 MED ORDER — ALUM & MAG HYDROXIDE-SIMETH 200-200-20 MG/5ML PO SUSP: 30.0000 mL | ORAL | Status: DC | PRN

## 2011-06-09 MED ORDER — ENOXAPARIN SODIUM 40 MG/0.4ML ~~LOC~~ SOLN
40.0000 mg | SUBCUTANEOUS | Status: DC
  Administered 2011-06-09: 40 mg via SUBCUTANEOUS
  Filled 2011-06-09 (×2): qty 0.4

## 2011-06-09 MED ORDER — MECLIZINE HCL 25 MG PO TABS
25.0000 mg | ORAL_TABLET | Freq: Three times a day (TID) | ORAL | Status: DC | PRN
  Filled 2011-06-09: qty 1

## 2011-06-09 MED ORDER — PROCHLORPERAZINE 25 MG RE SUPP
12.5000 mg | Freq: Four times a day (QID) | RECTAL | Status: DC | PRN
  Filled 2011-06-09: qty 1

## 2011-06-09 MED ORDER — CALCIUM CARBONATE 1250 (500 CA) MG PO TABS
1.0000 | ORAL_TABLET | Freq: Every day | ORAL | Status: DC
  Administered 2011-06-10: 500 mg via ORAL
  Filled 2011-06-09 (×2): qty 1

## 2011-06-09 MED ORDER — SIMVASTATIN 20 MG PO TABS
20.0000 mg | ORAL_TABLET | Freq: Every day | ORAL | Status: DC
  Administered 2011-06-10: 20 mg via ORAL
  Filled 2011-06-09 (×2): qty 1

## 2011-06-09 MED ORDER — PROCHLORPERAZINE EDISYLATE 5 MG/ML IJ SOLN
5.0000 mg | Freq: Four times a day (QID) | INTRAMUSCULAR | Status: DC | PRN
  Filled 2011-06-09: qty 2

## 2011-06-09 MED ORDER — DIPHENHYDRAMINE HCL 12.5 MG/5ML PO ELIX
12.5000 mg | ORAL_SOLUTION | Freq: Four times a day (QID) | ORAL | Status: DC | PRN
Start: 2011-06-09 — End: 2011-06-10

## 2011-06-09 MED ORDER — SIMVASTATIN 20 MG PO TABS
20.0000 mg | ORAL_TABLET | Freq: Every day | ORAL | Status: DC
  Administered 2011-06-09: 20 mg via ORAL
  Filled 2011-06-09 (×2): qty 1

## 2011-06-09 MED ORDER — FLEET ENEMA 7-19 GM/118ML RE ENEM
1.0000 | ENEMA | Freq: Once | RECTAL | Status: AC | PRN
  Filled 2011-06-09: qty 1

## 2011-06-09 MED ORDER — ACETAMINOPHEN 325 MG PO TABS
325.0000 mg | ORAL_TABLET | ORAL | Status: DC | PRN
  Administered 2011-06-09 – 2011-06-10 (×2): 650 mg via ORAL
  Filled 2011-06-09 (×2): qty 2

## 2011-06-09 MED ORDER — ZOLPIDEM TARTRATE 5 MG PO TABS
5.0000 mg | ORAL_TABLET | Freq: Every day | ORAL | Status: DC
  Administered 2011-06-09: 5 mg via ORAL
  Filled 2011-06-09: qty 1

## 2011-06-09 MED ORDER — ASPIRIN EC 325 MG PO TBEC
325.0000 mg | DELAYED_RELEASE_TABLET | Freq: Every day | ORAL | Status: DC
  Administered 2011-06-10: 325 mg via ORAL
  Filled 2011-06-09 (×2): qty 1

## 2011-06-09 NOTE — Progress Notes (Signed)
Patient arrived to 4030 at 1410. Alert and oriented x 4. Oriented to rehab unit, call bell system, therapy schedule, and safety plan. Felicia Hunter

## 2011-06-09 NOTE — Progress Notes (Signed)
Stroke Team Progress Note  HISTORY Felicia Hunter is an 71 y.o. female was initially evaluated in the emergency room at [facility] 06/07/2011 following acute onset of dizziness and nausea as well as unsteady gait. Patient has no previous history of stroke nor TIA. She has not been on antiplatelet therapy. Onset of symptoms was about 1530 on 06/07/2011. CT scan of her head showed no acute intracranial abnormality. MRI study showed findings consistent with multiple patchy small areas of ischemic infarction involving the left cerebellum. NIH stroke score was 0.  SUBJECTIVE No family is at the bedside. Overall she feels her condition is stable. Plans are in place for discharge to CIR today.  OBJECTIVE Filed Vitals:   06/08/11 2200 06/09/11 0200 06/09/11 0600 06/09/11 1000  BP: 125/65 121/80 120/73 119/75  Pulse: 62 63 61 57  Temp: 98.3 F (36.8 C) 98 F (36.7 C) 98 F (36.7 C) 97.9 F (36.6 C)  TempSrc: Oral Oral Oral Oral  Resp: 18 18 18 18   Height:      Weight:      SpO2: 98% 97% 98% 94%   CBG (last 3)   Basename 06/08/11 0640 06/07/11 1900  GLUCAP 98 136*   Intake/Output from previous day: 03/26 0701 - 03/27 0700 In: 860 [P.O.:860] Out: -   IV Fluid Intake:      . sodium chloride 75 mL/hr at 06/08/11 0700   Medications     . aspirin  300 mg Rectal Daily   Or  . aspirin  325 mg Oral Daily  . calcium carbonate  1 tablet Oral Daily  . enoxaparin  40 mg Subcutaneous Q24H  . simvastatin  20 mg Oral Daily  . sodium chloride  3 mL Intravenous Q12H  . zolpidem  5 mg Oral QHS  . DISCONTD: calcium carbonate  600 mg Oral Daily  PRN acetaminophen, HYDROcodone-acetaminophen, meclizine, promethazine, senna-docusate  Diet:  Cardiac thin liquids Activity:  Bathroom privileges DVT Prophylaxis:  Lovenox 40 mg sq daily   Significant Diagnostic Studies: CBC    Component Value Date/Time   WBC 9.7 06/07/2011 1716   WBC 5.8 07/28/2009 1504   RBC 4.47 06/07/2011  1716   HGB 15.0 06/07/2011 1926   HGB 13.4 07/28/2009 1504   HCT 44.0 06/07/2011 1926   HCT 39.4 07/28/2009 1504   PLT 258 06/07/2011 1716   PLT 254 07/28/2009 1504   MCV 95.1 06/07/2011 1716   MCV 94 07/28/2009 1504   MCH 32.2 06/07/2011 1716   MCH 31.8 07/28/2009 1504   MCHC 33.9 06/07/2011 1716   MCHC 34.0 07/28/2009 1504   RDW 12.9 06/07/2011 1716   RDW 10.4* 07/28/2009 1504   LYMPHSABS 1.1 06/07/2011 1716   LYMPHSABS 1.8 07/28/2009 1504   MONOABS 0.4 06/07/2011 1716   EOSABS 0.1 06/07/2011 1716   EOSABS 0.1 07/28/2009 1504   BASOSABS 0.1 06/07/2011 1716   BASOSABS 0.0 07/28/2009 1504   CMP    Component Value Date/Time   NA 138 06/07/2011 1926   K 3.6 06/07/2011 1926   CL 108 06/07/2011 1926   CO2 24 06/07/2011 1716   GLUCOSE 142* 06/07/2011 1926   BUN 21 06/07/2011 1926   CREATININE 0.80 06/07/2011 1926   CALCIUM 10.7* 06/07/2011 1716   PROT 7.2 06/07/2011 1716   ALBUMIN 4.4 06/07/2011 1716   AST 21 06/07/2011 1716   ALT 15 06/07/2011 1716   ALKPHOS 50 06/07/2011 1716   BILITOT 0.8 06/07/2011 1716   GFRNONAA 86* 06/07/2011  1716   GFRAA >90 06/07/2011 1716   COAGS Lab Results  Component Value Date   INR 0.94 06/07/2011   INR 1.00 09/15/2010   INR 0.92 11/24/2009   Lipid Panel    Component Value Date/Time   CHOL 213* 06/09/2011 0535   HgbA1C  Lab Results  Component Value Date   HGBA1C 5.8* 06/07/2011   Urine Drug Screen  No results found for this basename: labopia,  cocainscrnur,  labbenz,  amphetmu,  thcu,  labbarb    Alcohol Level No results found for this basename: eth   CT of the brain  06/07/2011 Negative exam  MRI of the brain  06/07/2011   Patchy areas of acute infarct in the left cerebellum.  MRA of the brain  Not ordered   2D Echocardiogram  EF 55-66% with no source of embolus.   Carotid Doppler  No internal carotid artery stenosis bilaterally. Vertebrals with antegrade flow bilaterally.  LE Venous Doppler No evidence of DVT or baker's cyst.  CXR  06/07/2011  1.  Minimal  left basilar airspace opacity likely reflects atelectasis; lungs otherwise clear.  Lung bases not well assessed due to overlying bra material, outside the patient. 2.  Degenerative change at the right glenohumeral joint.    EKG  normal sinus rhythm, PAC's noted.   Physical Exam    Pleasant elderly caucasian lady not in distress. Afebrile. Cardiac exam no murmur or gallop.Lungs clear to auscultation.distal pulses well felt. Neurological exam ; Awake  Alert oriented x 3. Normal speech and language.eye movements full without nystagmus. Face symmetric. Tongue midline. Normal strength, tone, reflexes and coordination. Normal sensation. Gait deferred as complains of truncal ataxia and dizziness   ASSESSMENT [name] is a 71 y.o. female with a patchy left cerebellar infacts, secondary to unknown source - likely small vessel disease due to location in white matter. Stroke workup completed. Not on antiplatelets prior admission. Now on aspirin 325 mg orally every day for secondary stroke prevention. Patient with resultant syncope, balance disturbance, nausea . Pt just completed a long drive from [place].  Hospital day # 2  TREATMENT/PLAN - Continue aspirin 325 mg orally every day for secondary stroke prevention. -agree with plans for CIR today -Stroke Service will sign off. Follow up with Dr. Pearlean Brownie in 2 months.  Joaquin Music, ANP-BC, GNP-BC [facility] Pager: [phone] 06/09/2011 10:36 AM  Dr. Delia Heady, Stroke Center Medical Director, has personally reviewed chart, pertinent data, examined the patient and developed the plan of care.

## 2011-06-09 NOTE — Progress Notes (Signed)
Agree with student PT treatment note. Melitza Metheny, PT DPT [phone]  

## 2011-06-09 NOTE — Discharge Instructions (Signed)
STROKE/TIA DISCHARGE INSTRUCTIONS SMOKING Cigarette smoking nearly doubles your risk of having a stroke & is the single most alterable risk factor  If you smoke or have smoked in the last 12 months, you are advised to quit smoking for your health.  Most of the excess cardiovascular risk related to smoking disappears within a year of stopping.  Ask you doctor about anti-smoking medications  [place] Quit Line: 1-800-QUIT NOW  Free Smoking Cessation Classes ([phone]  CHOLESTEROL Know your levels; limit fat & cholesterol in your diet  Lipid Panel  No results found for this basename: chol, trig, hdl, cholhdl, vldl, ldlcalc      Many patients benefit from treatment even if their cholesterol is at goal.  Goal: Total Cholesterol (CHOL) less than 160  Goal:  Triglycerides (TRIG) less than 150  Goal:  HDL greater than 40  Goal:  LDL (LDLCALC) less than 100   BLOOD PRESSURE American Stroke Association blood pressure target is less that 120/80 mm/Hg  Your discharge blood pressure is:  BP: 112/63 mmHg  Monitor your blood pressure  Limit your salt and alcohol intake  Many individuals will require more than one medication for high blood pressure  DIABETES (A1c is a blood sugar average for last 3 months) Goal HGBA1c is under 7% (HBGA1c is blood sugar average for last 3 months)  Diabetes:   Lab Results  Component Value Date   HGBA1C 5.8* 06/07/2011     Your HGBA1c can be lowered with medications, healthy diet, and exercise.  Check your blood sugar as directed by your physician  Call your physician if you experience unexplained or low blood sugars.  PHYSICAL ACTIVITY/REHABILITATION Goal is 30 minutes at least 4 days per week    {STROKE DC ACTIVITY/REHAB:22359}  Activity decreases your risk of heart attack and stroke and makes your heart stronger.  It helps control your weight and blood pressure; helps you relax and can improve your mood.  Participate in a regular exercise  program.  Talk with your doctor about the best form of exercise for you (dancing, walking, swimming, cycling).  DIET/WEIGHT Goal is to maintain a healthy weight  Your discharge diet is: Cardiac liquids Your height is:  Height: 5\' 6"  (167.6 cm) Your current weight is: Weight: 61.888 kg (136 lb 7 oz) Your Body Mass Index (BMI) is:  BMI (Calculated): 22.1   Following the type of diet specifically designed for you will help prevent another stroke.  Your goal weight range is:  Your goal Body Mass Index (BMI) is 19-24.  Healthy food habits can help reduce 3 risk factors for stroke:  High cholesterol, hypertension, and excess weight.  RESOURCES Stroke/Support Group:  Call [phone]  they meet the 3rd Sunday of the month on the Rehab Unit at [facility] [place] ( no meetings June, July & Aug).

## 2011-06-09 NOTE — Discharge Summary (Signed)
Physician Discharge Summary  Patient ID: Felicia Hunter MRN: [id number] DOB/AGE: 08/24/1940 71 y.o.  Admit date: 06/07/2011 Discharge date: 06/09/2011  Primary Care Physician:  Londell Moh, MD, MD  Discharge Diagnoses:    .Near syncope Cerebellar CVA Hyperlipidemia, cholesterol 213, LDL 108 History of bilateral mastectomy in the setting of breast cancer- follows with Dr. Myna Hidalgo     Consults:  Neurology                    Inpatient rehabilitation   Discharge Medications: Medication List  As of 06/09/2011  8:12 AM   STOP taking these medications         aspirin 325 MG tablet (patient had taken it only once prior to admission )      CALCIUM PO      fish oil-omega-3 fatty acids 1000 MG capsule      GLUCOSAMINE PO      MULTIVITAMIN PO      VITAMIN D PO         TAKE these medications         ALEVE 220 MG tablet   Generic drug: naproxen sodium   Take 220 mg by mouth 2 (two) times daily as needed. For pain      AMBIEN PO   Take 10 mg by mouth daily as needed. For sleep      BIOTIN PO   Take 1 tablet by mouth daily.      CALCIUM 600 600 MG Tabs   Generic drug: calcium carbonate   Take 600 mg by mouth daily.      cholecalciferol 400 UNITS Tabs   Commonly known as: VITAMIN D   Take 1,000 Units by mouth daily.      glucosamine-chondroitin 500-400 MG tablet   Take 1 tablet by mouth 2 (two) times daily.      mulitivitamin with minerals Tabs   Take 1 tablet by mouth daily.      omega-3 acid ethyl esters 1 G capsule   Commonly known as: LOVAZA   Take 2 g by mouth 2 (two) times daily.           Inpatient Medications and continue at discharge:   ASPIRIN 325MG  PO DAILY ZOCOR 20MG  PO DAILY Meclizine 25mg  PO TID PRN  Brief H and P: For complete details please refer to admission H and P, but in brief, 71 year old female who [facility] ED with sudden onset dizziness, vertigo, unsteady gait that occurred on the day of admission. She denied similar  episodes in the past and felt as she was going to pass out. Patient was initially admitted with near syncope workup and found to have multiple ischemic CVAs on the MRI.  Hospital Course:    *CVA (cerebral infarction)-mid/inf cerebellar infarct 3/25 - Patient was admitted for full stroke workup after found to have multiple mid to inferior left cerebellar ischemic CVAs on the MRI. Stroke service was contacted for consultation. Patient underwent for stroke workup with 2-D echo, EF 55-65% with no regional wall motion abnormalities. Carotid Dopplers did not reveal any ICA stenosis. Lower extremity duplex was also done which showed no evidence of DVT. Patient had only taken aspirin once before on the day the admission. Patient will remain indefinitely on aspirin 325 mg daily. Lipid panel was checked and patient also has hyperlipidemia hence started on Zocor 20 mg daily. PTOT evaluation was done and patient qualifies for inpatient rehabilitation.   DEPRESSION: Stable for now   ASTHMA:  Stable  breast cancer, right DCIS s/p R mastectomy 08/20/09: Patient is to followup with Dr. Myna Hidalgo outpatient   Near syncope: Likely secondary to multiple cerebellar ischemic CVAs   Day of Discharge BP 120/73  Pulse 61  Temp(Src) 98 F (36.7 C) (Oral)  Resp 18  Ht 5\' 6"  (1.676 m)  Wt 61.888 kg (136 lb 7 oz)  BMI 22.02 kg/m2  SpO2 98%  Physical Exam: General: Alert and awake oriented x3 not in any acute distress. HEENT: anicteric sclera, pupils reactive to light and accommodation CVS: S1-S2 clear no murmur rubs or gallops Chest: clear to auscultation bilaterally, no wheezing rales or rhonchi Abdomen: soft nontender, nondistended, normal bowel sounds, no organomegaly Extremities: no cyanosis, clubbing or edema noted bilaterally Neuro: Cranial nerves II-XII intact, no focal neurological deficits   The results of significant diagnostics from this hospitalization (including imaging, microbiology, ancillary and  laboratory) are listed below for reference.    LAB RESULTS: Basic Metabolic Panel:  Lab 06/07/11 4098 06/07/11 1716  NA 138 136  K 3.6 3.6  CL 108 100  CO2 -- 24  GLUCOSE 142* 140*  BUN 21 19  CREATININE 0.80 0.69  CALCIUM -- 10.7*  MG -- 2.0  PHOS -- 3.4   Liver Function Tests:  Lab 06/07/11 1716  AST 21  ALT 15  ALKPHOS 50  BILITOT 0.8  PROT 7.2  ALBUMIN 4.4   CBC:  Lab 06/07/11 1926 06/07/11 1716  WBC -- 9.7  NEUTROABS -- 8.0*  HGB 15.0 14.4  HCT 44.0 42.5  MCV -- 95.1  PLT -- 258   Cardiac Enzymes:  Lab 06/07/11 1915  CKTOTAL 70  CKMB 2.4  CKMBINDEX --  TROPONINI <0.30   BNP: No components found with this basename: POCBNP:2 CBG:  Lab 06/08/11 0640 06/07/11 1900  GLUCAP 98 136*    Significant Diagnostic Studies:  Dg Chest 2 View  06/07/2011  *RADIOLOGY REPORT*  Clinical Data: CVA; admission chest radiograph.  CHEST - 2 VIEW  Comparison: Chest radiograph performed 12/26/2009  Findings: The lungs are well-aerated.  Minimal left basilar opacity likely reflects atelectasis.  There is no evidence of pleural effusion or pneumothorax.  The heart is normal in size; the mediastinal contour is within normal limits.  No acute osseous abnormalities are seen.  Hardware is noted along the distal right clavicle.  Degenerative change is noted at the right glenohumeral joint, with mild bony remodelling and marked sclerosis.  The lung bases are not well assessed due to overlying bra material, outside the patient.  IMPRESSION:  1.  Minimal left basilar airspace opacity likely reflects atelectasis; lungs otherwise clear.  Lung bases not well assessed due to overlying bra material, outside the patient. 2.  Degenerative change at the right glenohumeral joint.  Original Report Authenticated By: Tonia Ghent, M.D.   Ct Head Wo Contrast  06/07/2011  *RADIOLOGY REPORT*  Clinical Data: Nausea and emesis.  Dizziness  CT HEAD WITHOUT CONTRAST  Technique:  Contiguous axial images were  obtained from the base of the skull through the vertex without contrast.  Comparison: None  Findings: The brain has a normal appearance without evidence for hemorrhage, infarction, hydrocephalus, or mass lesion.  There is no extra axial fluid collection.  The skull and paranasal sinuses are normal.  IMPRESSION: Negative exam.  Original Report Authenticated By: Rosealee Albee, M.D.   Mr [facility] Head Wo Contrast  06/08/2011  *RADIOLOGY REPORT*  Clinical Data: Left cerebellar infarcts.  Dizziness and nausea with vomiting.  MRA HEAD WITHOUT CONTRAST  Technique: Angiographic images of the Circle of Willis were obtained using MRA technique without intravenous contrast.  Comparison: 06/07/2011 MR brain.  Findings: In this patient with mid to inferior left cerebellar infarcts, there is an abnormal appearance of the left vertebral artery with marked focal stenosis at the expected takeoff of the left PICA.  The left PICA is not visualized.  Mild irregularity of the basilar artery.  Nonvisualization right AICA.  Moderate irregularity and narrowing of the left AICA.  Mild to moderate narrowing irregularity superior cerebellar artery bilaterally.  Mild irregularity posterior cerebral artery distal branches bilaterally.  Ectatic vertical cervical segment of the internal carotid artery bilaterally with narrowing on the right.  Anterior circulation without medium or large size vessel significant stenosis or occlusion.  Minimal bulge along the superior margin of the M1 segment of the right middle cerebral artery without discrete aneurysm noted.  Mild irregularity middle cerebral artery distal branches.  Fetal type origin of the left posterior cerebral artery.  No aneurysm or vascular formation noted.  IMPRESSION: In this patient with mid to inferior left cerebellar infarcts, there is an abnormal appearance of the left vertebral artery with marked focal stenosis at the expected takeoff of the left PICA. The left PICA is not  visualized.  Original Report Authenticated By: Fuller Canada, M.D.   Mr Brain Wo Contrast  06/07/2011  *RADIOLOGY REPORT*  Clinical Data: Near syncope.  Nausea and vomiting and dizziness  MRI HEAD WITHOUT CONTRAST  Technique:  Multiplanar, multiecho pulse sequences of the brain and surrounding structures were obtained according to standard protocol without intravenous contrast.  Comparison: CT 06/07/2011  Findings: Patchy areas of restricted diffusion in the left mid and inferior cerebellum compatible with acute infarct.  No other areas of acute infarct.  Small areas of chronic infarct in the frontal white matter bilaterally.  Negative for hemorrhage or mass. Ventricle size is normal.  No midline shift.  Paranasal sinuses are clear.  IMPRESSION: Patchy areas of acute infarct in the left cerebellum.  Original Report Authenticated By: Camelia Phenes, M.D.     Disposition and Follow-up: Discharge Orders    Future Appointments: Provider: Department: Dept Phone: Center:   06/25/2011 2:40 PM Adolph Pollack, MD [facility] [phone] None   10/27/2011 2:00 PM Josph Macho, MD [facility] [phone] None       DISPOSITION: Inpatient rehabilitation  DIET: Heart healthy diet  ACTIVITY:Inpatient rehabilitation   DISCHARGE FOLLOW-UP Follow-up Information    Follow up with Londell Moh, MD. Schedule an appointment as soon as possible for a visit in 2 weeks.      Follow up with Gates Rigg, MD. Schedule an appointment as soon as possible for a visit in 4 weeks.   Contact information:   [address], [address] [facility] [place] [address] [phone]          Time spent on Discharge: 45 minutes  Signed:  Tymara Saur M.D. [facility] 06/09/2011, 8:12 AM

## 2011-06-09 NOTE — Progress Notes (Signed)
TCD completed. 

## 2011-06-09 NOTE — Progress Notes (Signed)
Pt given stroke education with "beyond the hospital" booklet. Pt verbalized understanding. Pt D/C'd to rehab. Rema Fendt, RN

## 2011-06-09 NOTE — Progress Notes (Signed)
Occupational Therapy Treatment Patient Details Name: Felicia Hunter MRN: [id number] DOB: [birth year] Today's Date: 06/09/2011  OT Assessment/Plan OT Assessment/Plan Comments on Treatment Session: Pt. continues to have mild balance deficits and impulsivity requiring assistance with ADLs for safety OT Plan: Discharge plan remains appropriate OT Frequency: Min 2X/week Recommendations for Other Services: Rehab consult Follow Up Recommendations: Supervision/Assistance - 24 hour;Inpatient Rehab Equipment Recommended: Defer to next venue OT Goals Acute Rehab OT Goals OT Goal Formulation: With patient Time For Goal Achievement: 2 weeks ADL Goals Pt Will Perform Grooming: Standing at sink;with modified independence ADL Goal: Grooming - Progress: Progressing toward goals Pt Will Transfer to Toilet: with modified independence;Ambulation;Comfort height toilet ADL Goal: Toilet Transfer - Progress: Progressing toward goals Pt Will Perform Toileting - Clothing Manipulation: with modified independence;Standing ADL Goal: Toileting - Clothing Manipulation - Progress: Progressing toward goals Additional ADL Goal #1: Pt. will perform path finding activity with min guard assist ADL Goal: Additional Goal #1 - Progress: Progressing toward goals  OT Treatment Precautions/Restrictions  Precautions Precautions: Fall Restrictions Weight Bearing Restrictions: No   ADL ADL Grooming: Performed;Wash/dry hands;Set up (minguard assist) Grooming Details (indicate cue type and reason): Min verbal cues for safety with RW use Where Assessed - Grooming: Standing at sink Toilet Transfer: Performed;Minimal assistance Toilet Transfer Details (indicate cue type and reason): Min verbal cues for hand placement and safety with moving RW Toilet Transfer Method: Ambulating Toilet Transfer Equipment: Raised toilet seat with arms (or 3-in-1 over toilet) Toileting - Clothing Manipulation: Performed;Set  up;Supervision/safety Toileting - Clothing Manipulation Details (indicate cue type and reason): With moving gown Where Assessed - Toileting Clothing Manipulation: Standing Toileting - Hygiene: Performed;Supervision/safety Where Assessed - Toileting Hygiene: Sit on 3-in-1 or toilet Ambulation Related to ADLs: Pt. with improved balance today, however still provided with min assist with ~15' due to intermittent unsteadiness ADL Comments: Pt. moves quickly - ?mild impulsivity.  Pt. does report she moves quickly PTA.   Pt. requires assist for ADLs due to balance deficits Mobility  Bed Mobility Bed Mobility: Yes Supine to Sit: 5: Supervision;HOB elevated (Comment degrees) Transfers Transfers: Yes Sit to Stand: 4: Min assist;From bed;With upper extremity assist     End of Session OT - End of Session Equipment Utilized During Treatment: Gait belt Activity Tolerance: Patient tolerated treatment well Patient left: Other (comment) (in w/c going off floor) Nurse Communication: Mobility status for transfers General Behavior During Session: [facility] for tasks performed Cognition: [facility] for tasks performed  Jayle Solarz, OTR/L Pager [phone]  06/09/2011, 11:37 AM

## 2011-06-09 NOTE — H&P (Signed)
Physical Medicine and Rehabilitation Admission H&P  CHIEF COMPLAINT: Nausea, dizziness, problems walking  HPI: Felicia Hunter is an 71 y.o. female who presents to [facility] ED with main concern of sudden onset dizziness, vertigo, unsteady gait that occurred today the day of admission. She denies similar episodes in the past and reports today she felt as was going to pass out. Her symptoms were associated with nausea but no vomiting. She denies recent sicknesses or hospitalizations, no fevers, chills, no specific abdominal or urinary concerns, no focal neurologic deficits, no headaches and no other visual changes. CT head without acute changes. MRI brain done with acute infarct in left cerebellum. 2 D echo done revealing EF 55-65%, calcified annulus mitral valve. Neurology recommended continuing ASA for CVA prophylaxis. Patient just returned [place] this weekend and BLE dopplers/ TCD ordered for work up. LE dopplers without DVT. Carotid dopplers without ICA stenosis  Past Medical History   Diagnosis  Date   .  GERD (gastroesophageal reflux disease)    .  Cancer      breast   .  Arthritis    .  Cough     Past Surgical History   Procedure  Date   .  Joint replacement  july 9th, 2012     left knee replacement   .  Collarbone  september 2011     pinned and plated   .  Breast surgery  june 2011     double mastectomy   .  Abdominal hysterectomy  1993    History reviewed. No pertinent family history.  Social History: Lives alone. Independent and active. She reports that she has never smoked. She has never used smokeless tobacco. She reports that she drinks a glass of wine daily. She reports that she does not use illicit drugs  Allergies   Allergen  Reactions   .  Sulfonamide Derivatives  Rash    Scheduled Meds:  .  aspirin  300 mg  Rectal  Daily    Or   .  aspirin  325 mg  Oral  Daily   .  calcium carbonate  1 tablet  Oral  Daily   .  enoxaparin  40 mg  Subcutaneous  Q24H   .  simvastatin  20 mg   Oral  Daily   .  sodium chloride  3 mL  Intravenous  Q12H   .  zolpidem  5 mg  Oral  QHS   .  DISCONTD: calcium carbonate  600 mg  Oral  Daily    Continuous Infusions:  .  sodium chloride  75 mL/hr at 06/08/11 0700    PRN Meds:.acetaminophen, HYDROcodone-acetaminophen, meclizine, promethazine, senna-docusate  Medications Prior to Admission   Medication  Sig  Dispense  Refill   .  Zolpidem Tartrate (AMBIEN PO)  Take 10 mg by mouth daily as needed. For sleep     Home:  Home Living  Lives With: Alone  Type of Home: House  Home Layout: Two level;Able to live on main level with bedroom/bathroom  Home Access: Stairs to enter  Entrance Stairs-Number of Steps: 2  Bathroom Shower/Tub: Walk-in shower (first floor)  Bathroom Toilet: Standard  Bathroom Accessibility: Yes  How Accessible: Accessible via walker  Home Adaptive Equipment: Bedside commode/3-in-1;Walker - rolling;Straight cane  Additional Comments: Dtr. lives ~ away and can provide assist PRN. Pt. able to live on first floor - reports bed is very high  Functional History:  Prior Function  Level of Independence: Independent with  basic ADLs;Independent with homemaking with ambulation;Independent with gait;Independent with transfers  Able to Take Stairs?: Reciprically  Driving: Yes  Vocation: Retired  Functional Status:  Mobility:  Bed Mobility  Bed Mobility: Yes  Supine to Sit: 5: Supervision;HOB elevated (Comment degrees) (30)  Supine to Sit Details (indicate cue type and reason): Pt required supervision for safety  Sit to Supine: 5: Supervision;HOB flat  Transfers  Transfers: Yes  Sit to Stand: 4: Min assist;From bed;With upper extremity assist  Sit to Stand Details (indicate cue type and reason): Pt required min assist due to nausea and dizziness.  Stand to Sit: With upper extremity assist;With armrests;To chair/3-in-1 (min guard assist)  Ambulation/Gait  Ambulation/Gait: Yes  Ambulation/Gait Assistance: 4: Min  assist  Ambulation/Gait Assistance Details (indicate cue type and reason): Pt required min assist for safety secondary to nausea and performing turns too quickly.  Ambulation Distance (Feet): 200 Feet  Assistive device: Rolling walker  Gait Pattern: Step-to pattern;Decreased step length - left  Stairs: Yes  Stairs Assistance: 4: Min assist  Stairs Assistance Details (indicate cue type and reason): Min assist for safety and VC for sequencing.  Stair Management Technique: Two rails;Step to pattern  Number of Stairs: 2   ADL:  ADL  Eating/Feeding: Simulated;Independent  Where Assessed - Eating/Feeding: Bed level  Grooming: Performed;Wash/dry hands;Teeth care (min guard assist)  Where Assessed - Grooming: Standing at sink  Upper Body Bathing: Simulated;Supervision/safety  Where Assessed - Upper Body Bathing: Unsupported;Sitting, bed  Lower Body Bathing: Simulated;Minimal assistance  Where Assessed - Lower Body Bathing: Sit to stand from bed  Upper Body Dressing: Simulated;Supervision/safety  Where Assessed - Upper Body Dressing: Unsupported;Sitting, bed  Lower Body Dressing: Simulated;Minimal assistance  Where Assessed - Lower Body Dressing: Sit to stand from bed  Toilet Transfer: Performed;Minimal assistance  Toilet Transfer Method: Ambulating  Toilet Transfer Equipment: Comfort height toilet;Grab bars  Toileting - Clothing Manipulation: Performed (min guard assist)  Where Assessed - Glass blower/designer Manipulation: Standing  Toileting - Hygiene: Performed;Supervision/safety  Where Assessed - Toileting Hygiene: Sit on 3-in-1 or toilet  Ambulation Related to ADLs: Pt ambulated to BR and back with min A pushing monitor. Pt. with one episode of mild unsteadiness. Pt. reported nausea 5/10 prior to ambulating to BR and 2/10 upon returning to bed  ADL Comments: Pt. moves quickly - ?mild impulsivity. Pt. does report she moves quickly PTA. Pt. requires assist for ADLs due to balance deficits    Cognition:  Cognition  Arousal/Alertness: Awake/alert  Orientation Level: Oriented X4  Cognition  Arousal/Alertness: Awake/alert  Overall Cognitive Status: Appears within functional limits for tasks assessed  Orientation Level: Oriented X4  Blood pressure 120/73, pulse 61, temperature 98 F (36.7 C), temperature source Oral, resp. rate 18, height 5\' 6"  (1.676 m), weight 61.888 kg (136 lb 7 oz), SpO2 98.00%.  Review of Systems  Eyes: Negative for blurred vision and double vision.  Respiratory: Positive for cough. Negative for sputum production and shortness of breath.  Cardiovascular: Negative for chest pain and palpitations.  Gastrointestinal: Positive for constipation. Negative for nausea and vomiting.  Genitourinary: Negative for dysuria and urgency.  Musculoskeletal: Negative for back pain and joint pain.  Neurological: Positive for headaches.  Psychiatric/Behavioral: Negative for depression. The patient has insomnia. The patient is not nervous/anxious.  All other systems reviewed and are negative.   Physical Exam  Constitutional: She is oriented to person, place, and time. She appears well-developed and well-nourished.  HENT: Oral mucosa pink and moist Head: Normocephalic.  Eyes:  Pupils are equal, round, and reactive to light. Extraocular eye movements are intact. Neck: Normal range of motion. Neck supple.  Cardiovascular: Normal rate and regular rhythm. No murmur rubs or gallops. Pulmonary/Chest: Effort normal and breath sounds normal. She has no wheezes, rales, rhonchi..  Abdominal: Bowel sounds are normal. Abdomen is nontender nondistended  Musculoskeletal: Normal range of motion. She exhibits no edema.  Well healed old left TKR incision.  Neurological: She is alert and oriented to person, place, and time. She has minimal to mild diminished fine motor coordination of left hand and left leg. There is improvement from yesterday's exam. Strength is generally symmetrical at 4+ to  5 out of 5. Cranial nerve exam is intact with any obvious visual deficits seen today. Cognitively she is appropriate. Speech is clear. She has good insight and awareness overall. Skin: Skin is warm and dry.  Vitiligo noted on extremities, face and upper back.  Psych: She is a bit anxious.  GLUCOSE, CAPILLARY Status: Normal    Collection Time    06/08/11 6:40 AM   Component  Value  Range  Comment    Glucose-Capillary  98  70 - 99 (mg/dL)    MRSA PCR SCREENING Status: Normal    Collection Time    06/08/11 4:20 PM   Component  Value  Range  Comment    MRSA by PCR  NEGATIVE  NEGATIVE    LIPID PANEL Status: Abnormal    Collection Time    06/09/11 5:35 AM   Component  Value  Range  Comment    Cholesterol  213 (*)  0 - 200 (mg/dL)     Triglycerides  147  <150 (mg/dL)     HDL  81  >82 (mg/dL)     Total CHOL/HDL Ratio  2.6      VLDL  24  0 - 40 (mg/dL)     LDL Cholesterol  956 (*)  0 - 99 (mg/dL)    Post Admission Physician Evaluation:  1. Functional deficits secondary to left cerebellar infarct. 2. Patient is admitted to receive collaborative, interdisciplinary care between the physiatrist, rehab nursing staff, and therapy team. 3. Patient's level of medical complexity and substantial therapy needs in context of that medical necessity cannot be provided at a lesser intensity of care such as a SNF. 4. Patient has experienced substantial functional loss from his/her baseline which was documented above under the "Functional History" and "Functional Status" headings. Judging by the patient's diagnosis, physical exam, and functional history, the patient has potential for functional progress which will result in measurable gains while on inpatient rehab. These gains will be of substantial and practical use upon discharge in facilitating mobility and self-care at the household level. 5. Physiatrist will provide 24 hour management of medical needs as well as oversight of the therapy plan/treatment and  provide guidance as appropriate regarding the interaction of the two. 6. 24 hour rehab nursing will assist with bladder management, bowel management, safety, disease management and patient education and help integrate therapy concepts, techniques,education, etc. 7. PT will assess and treat for: Lower extremity strength, balance, coordination, neuromuscular reeducation, safety and gait. Goals are: Modified independent. 8. OT will assess and treat for: Upper extremity strength, range of motion, functional mobility, ADLs, safety adaptive quit. Goals are: Outside independent. 9. SLP will assess and treat for: Not app. 10. Case Management and Social Worker will assess and treat for psychological issues and discharge planning. 11. Team conference will be held weekly to assess progress  toward goals and to determine barriers to discharge. 12. Patient will receive at least 3 hours of therapy per day at least 5 days per week. 13. ELOS and Prognosis: 57 days excellent Medical Problem List and Plan:  1. DVT Prophylaxis/Anticoagulation: Pharmaceutical: Lovenox  2. Pain Management: Mild headaches treated with prn tylenol. We'll monitor for further need. 3. Mood: in better spirits today. Will monitor for now. She is quite anxious to return home. Cursory use her energy in a positive fashion. 4. Dyslipidemia: add statin for management.  5. Nausea: Much improved. Monitor for recurrence. Encouraged adequate by mouth intake  Ranelle Oyster M.D. 06/09/2011

## 2011-06-09 NOTE — PMR Pre-admission (Signed)
PMR Admission Coordinator Pre-Admission Assessment  Patient:  Felicia Hunter is an 71 y.o., female MRN:  [id number] DOB:  1941-01-12 Height:  5\' 6"  (167.6 cm) Weight:  61.888 kg (136 lb 7 oz)  Insurance Information: HMO:    PPO:     PCP:     IPA:     80/20:yes     OTHER:no hmo PRIMARY:Medicare a and b      Policy#:[id number]      Subscriber:pt CM Name:      Phone#:     Fax#: Pre-Cert#:      Employer: Benefits:  Phone #:vision share     Name:06/09/11 Eff. Date:04/15/05     Deduct:$1184      Out of Pocket WUJ:WJXB      Life JYN:WGNF CIR:100%      SNF:20 full days LBD 09/23/10 Outpatient:80%     Co-Pay:20% Home Health:100%      Co-Pay:none DME:80%     Co-Pay:20% Providers:pt choice  SECONDARY:aarp medicare supplemental      Policy#:[id number]      Subscriber:pt No authorization required  Current Medical History:   Patient Admitting Diagnosis:  Left cerebellar infarct  History of Present Illness: Admitted 3/25 with sudden onset dizziness, vertigo, and unsteady gait. Nausea also with no vomiting. MRI revealed acute infarct in the left cerebellum. Neurology recommended continuing ASA for cva prophylaxis.   Patients Past Medical History:   Past Medical History  Diagnosis Date  . GERD (gastroesophageal reflux disease)   . Cancer     breast  . Arthritis   . Cough    Family Medical History:  family history is not on file. NIH Stroke scale: Total: 0  Glascow Coma Scale:   Patients Current Diet: Cardiac  Prior Rehab/Hospitalizations: none Medications Current Medications: Current facility-administered medications:0.9 %  sodium chloride infusion, , Intravenous, Continuous, Alison Murray, MD, Last Rate: 75 mL/hr at 06/08/11 0700;  acetaminophen (TYLENOL) tablet 650 mg, 650 mg, Oral, Q6H PRN, Rhetta Mura, MD, 650 mg at 06/09/11 6213;  aspirin suppository 300 mg, 300 mg, Rectal, Daily, Alison Murray, MD;  aspirin tablet 325 mg, 325 mg, Oral, Daily, Alison Murray, MD, 325 mg at 06/09/11  0865 calcium carbonate (OS-CAL - dosed in mg of elemental calcium) tablet 500 mg of elemental calcium, 1 tablet, Oral, Daily, Rhetta Mura, MD, 500 mg of elemental calcium at 06/09/11 0953;  enoxaparin (LOVENOX) injection 40 mg, 40 mg, Subcutaneous, Q24H, Alison Murray, MD, 40 mg at 06/08/11 2307;  HYDROcodone-acetaminophen (NORCO) 5-325 MG per tablet 1-2 tablet, 1-2 tablet, Oral, Q4H PRN, Alison Murray, MD meclizine (ANTIVERT) tablet 25 mg, 25 mg, Oral, TID PRN, Alison Murray, MD;  promethazine (PHENERGAN) injection 12.5 mg, 12.5 mg, Intravenous, Q6H PRN, Alison Murray, MD, 12.5 mg at 06/07/11 2358;  senna-docusate (Senokot-S) tablet 1 tablet, 1 tablet, Oral, QHS PRN, Alison Murray, MD;  simvastatin (ZOCOR) tablet 20 mg, 20 mg, Oral, Daily, Ripudeep K Rai, MD sodium chloride 0.9 % injection 3 mL, 3 mL, Intravenous, Q12H, Alison Murray, MD, 3 mL at 06/09/11 0953;  zolpidem (AMBIEN) tablet 5 mg, 5 mg, Oral, QHS, Alison Murray, MD, 5 mg at 06/08/11 2230;  DISCONTD: calcium carbonate (OS-CAL) tablet 600 mg, 600 mg, Oral, Daily, Alison Murray, MD  Precautions/Special Needs:    Additional Precautions/Restrictions: Precautions Precautions: Fall Restrictions Weight Bearing Restrictions: No  Therapy Assessments  Prior Function: Level of Independence: Independent with basic ADLs;Independent with homemaking with ambulation;Independent with gait;Independent with  transfers Able to Take Stairs?: Reciprically Driving: Yes Vocation: Retired  Additional Prior Functional Levels:  Bed Mobility: independent and active pta Cooking/Meal Prep: self Housework: self Money Management: self Driving: yes  Prior Activity Level: Community (5-7x/wk): active  ADLs/Mobility:Current Functional Level ADL Eating/Feeding: Simulated;Independent Where Assessed - Eating/Feeding: Bed level Grooming: Performed;Wash/dry hands;Set up;Minimal assistance Where Assessed - Grooming: Standing at sink Upper Body Bathing:  Simulated;Supervision/safety Where Assessed - Upper Body Bathing: Unsupported;Sitting, bed Lower Body Bathing: Simulated;Minimal assistance Where Assessed - Lower Body Bathing: Sit to stand from bed Upper Body Dressing: Simulated;Supervision/safety Where Assessed - Upper Body Dressing: Unsupported;Sitting, bed Lower Body Dressing: Simulated;Minimal assistance Where Assessed - Lower Body Dressing: Sit to stand from bed Toilet Transfer: Performed;Minimal assistance Toilet Transfer Method: Ambulating Toilet Transfer Equipment: Comfort height toilet;Grab bars Toileting - Clothing Manipulation: Performed (min guard assist) Where Assessed - Glass blower/designer Manipulation: Standing Toileting - Hygiene: Performed;Supervision/safety Where Assessed - Toileting Hygiene: Sit on 3-in-1 or toilet Ambulation Related to ADLs: Pt ambulated to BR and back with min A pushing monitor.  Pt. with one episode of mild unsteadiness.  Pt. reported nausea 5/10 prior to ambulating to BR and 2/10 upon returning to bed ADL Comments: Pt. moves quickly - ?mild impulsivity.  Pt. does report she moves quickly PTA.   Pt. requires assist for ADLs due to balance deficits  Bed Mobility Bed Mobility: Yes Supine to Sit: 5: Supervision;HOB elevated (Comment degrees) (30) Supine to Sit Details (indicate cue type and reason): Pt required supervision for safety Sit to Supine: 5: Supervision;HOB flat Transfers Transfers: Yes Sit to Stand: 4: Min assist;From bed;With upper extremity assist Sit to Stand Details (indicate cue type and reason): Pt required min assist due to nausea and dizziness. Stand to Sit: With upper extremity assist;With armrests;To chair/3-in-1 (min guard assist) Ambulation/Gait Ambulation/Gait: Yes Ambulation/Gait Assistance: 4: Min assist Ambulation/Gait Assistance Details (indicate cue type and reason): Pt required min assist for safety secondary to nausea and performing turns too quickly. Ambulation  Distance (Feet): 200 Feet Assistive device: Rolling walker Gait Pattern: Step-to pattern;Decreased step length - left Stairs: Yes Stairs Assistance: 4: Min assist Stairs Assistance Details (indicate cue type and reason): Min assist for safety and VC for sequencing. Stair Management Technique: Two rails;Step to pattern Number of Stairs: 2  Posture/Postural Control Posture/Postural Control: No significant limitations Balance Balance Assessed: Yes Static Standing Balance Static Standing - Balance Support: Bilateral upper extremity supported Static Standing - Level of Assistance: Other (comment) (Min guard) Static Standing - Comment/# of Minutes: 5 minutes.  Pt required min guard for safety with static standing  Home Assistive Devices/Equipment:  Home Assistive Devices/Equipment Home Assistive Devices/Equipment: None  Discharge Planning:  Living Arrangements: Alone Support Systems: Children Do you have any problems obtaining your medications?: No Type of Residence: Private residence Home Care Services: No Patient expects to be discharged to:: home Expected Discharge Date:  (ELOS 7 days) Case Management Consult Needed: Yes (Comment)  Previous Home Environment:  Living Arrangements: Alone Support Systems: Children Do you have any problems obtaining your medications?: No Type of Residence: Private residence Home Care Services: No Patient expects to be discharged to:: home Expected Discharge Date:  (ELOS 7 days) Home Environment Number of Levels: two level Previous Home Environment Number of Steps: 2 step entry Previous Home Environment Is Bedroom on Main Floor?: Yes Previous Home Environment Is Bathroom on Main Floor?: Yes  Discharge Living Setting:  Plans for Discharge Living Setting: Patient's home;Alone Discharge Living Setting Number of Levels: two level  Discharge Living Setting Number of Steps: 2 step entry Discharge Living Setting is Bedroom on Main Floor?: Yes Discharge  Living Setting is Bathroom on Main Floor?: Yes  Social/Family/Support Systems:  Patient Roles: Parent Contact Information: Ulysees Barns, daughter Anticipated Caregiver: daughter prn only Anticipated Caregiver's Contact Information: cell is [phone] Ability/Limitations of Caregiver: prn assist only Caregiver Availability: Intermittent Is Caregiver In Agreement with Plan?: Yes Does Caregiver/Family have Issues with Lodging/Transportation while Pt is in Rehab?: No  Goals/Additional Needs:  Patient/Family Goal for Rehab: Mod I with PT and OT Additional Information: spouse died last year. Patient's recent history of Cancer Pt/Family Agrees to Admission and willing to participate: Yes Program Orientation Provided & Reviewed with Pt/Caregiver Including Roles  & Responsibilities: Yes  Preadmission Screen Completed By:  Clois Dupes, 06/09/2011 10:18 AM  Patient's condition:  This patient's condition remains as documented in the Consult dated 06/08/11, in which the Rehabilitation Physician determined and documented that the patient's condition is appropriate for intensive rehabilitative care in an inpatient rehabilitation facility.  Preadmission Screen Competed by: Ottie Glazier, RN, Time/Date, 1017 on 06/09/11.  Discussed status with Dr.  Riley Kill on 06/09/11 at  1018 and received telephone approval for admission today.  Admission Coordinator:  Clois Dupes, time 9563 Date 06/09/11.

## 2011-06-09 NOTE — Progress Notes (Signed)
   CARE MANAGEMENT NOTE 06/09/2011  Patient:  Felicia Hunter, Felicia Hunter   Account Number:  [id number]  Date Initiated:  06/08/2011  Documentation initiated by:  Onnie Boer  Subjective/Objective Assessment:   PT WAS ADMITTED WITH BLURRED VISION AND DIZZINESS     Action/Plan:   PROGRESSION OF CARE AND DISCHARGE PLANNING   Anticipated DC Date:  06/11/2011   Anticipated DC Plan:  IP REHAB FACILITY      DC Planning Services  CM consult      Choice offered to / List presented to:             Status of service:  Completed, signed off Medicare Important Message given?   (If response is "NO", the following Medicare IM given date fields will be blank) Date Medicare IM given:   Date Additional Medicare IM given:    Discharge Disposition:  IP REHAB FACILITY  Per UR Regulation:  Reviewed for med. necessity/level of care/duration of stay  If discussed at Long Length of Stay Meetings, dates discussed:    Comments:  PCP:  Dr. Merri Brunette  06/09/11 Onnie Boer, RN, BSN 1405 PT DC'D TO CIR  06/08/2011 3:00 pm Darlyne Russian RN, CCM [phone] CIR/ Britta Mccreedy advised of Consult for IP rehab.  06/08/2011  11:30 am Darlyne Russian RN, [place]  [phone] Met with patient to discuss discharge planning. She lives alone in 2 story home, her daughter and granddaughters are nearby.   CM to continue to follow for discharge planning needs.   06/08/11 Onnie Boer, RN, BSN 1109 UR COMPLETED

## 2011-06-09 NOTE — Progress Notes (Signed)
Speech Language Pathology Note  Pt to d/c to CIR in a moment, will defer screen/eval of speech/language deficits. OT reports pt with no overt deficits in communication or cognition. Harlon Ditty, MA CCC-SLP [phone]

## 2011-06-09 NOTE — Progress Notes (Signed)
Patient to be admitted to CIR today. Please call for any questions. Pager [phone]

## 2011-06-09 NOTE — Progress Notes (Signed)
Physical Therapy Treatment Patient Details Name: Felicia Hunter MRN: [id number] DOB: [birth year] Today's Date: 06/09/2011  PT Assessment/Plan  PT - Assessment/Plan Comments on Treatment Session: Pt appeared much steadier today with ambulation and balance.  Patient described and desmonstrated correct sequencing with compensation techniques with turns while walking.   PT Plan: Discharge plan remains appropriate PT Frequency: Min 4X/week Recommendations for Other Services: Rehab consult Follow Up Recommendations: Inpatient Rehab Equipment Recommended: Defer to next venue PT Goals  Acute Rehab PT Goals PT Goal Formulation: With patient Time For Goal Achievement: 7 days Pt will go Supine/Side to Sit: Independently PT Goal: Supine/Side to Sit - Progress: Progressing toward goal Pt will go Sit to Stand: with supervision PT Goal: Sit to Stand - Progress: Progressing toward goal Pt will go Stand to Sit: with supervision PT Goal: Stand to Sit - Progress: Progressing toward goal Pt will Ambulate: >150 feet;with supervision;with least restrictive assistive device PT Goal: Ambulate - Progress: Progressing toward goal Pt will Go Up / Down Stairs: 3-5 stairs;with supervision;with rail(s) PT Goal: Up/Down Stairs - Progress: Progressing toward goal  PT Treatment Precautions/Restrictions  Precautions Precautions: Fall Restrictions Weight Bearing Restrictions: No Mobility (including Balance) Bed Mobility Bed Mobility: Yes Supine to Sit: 5: Supervision;HOB elevated (Comment degrees) (HOB elevated 30) Supine to Sit Details (indicate cue type and reason): Pt requires supervision for safety. Sit to Supine: 5: Supervision;HOB elevated (comment degrees) (HOB elevated 30) Sit to Supine - Details (indicate cue type and reason): Pt requires supervision for safety. Transfers Transfers: Yes Sit to Stand: Other (comment);From bed;With upper extremity assist;With armrests (Min guard) Sit to Stand Details  (indicate cue type and reason): Pt requires min guard in order to safely and slowly asced to standing. Stand to Sit: Other (comment);With upper extremity assist;With armrests;To bed (Min guard) Stand to Sit Details: Pt requires min guard to control descent onto bed. Ambulation/Gait Ambulation/Gait: Yes Ambulation/Gait Assistance: 4: Min assist Ambulation/Gait Assistance Details (indicate cue type and reason): Pt requires min assist for safety and VC for sequencing for compensation exercises with turning. Ambulation Distance (Feet): 200 Feet Assistive device: Rolling walker Gait Pattern: Step-through pattern;Decreased step length - left;Decreased stance time - right Stairs: Yes Stairs Assistance: 4: Min assist Stairs Assistance Details (indicate cue type and reason): MIn assist for safety and VC for sequencing on steps. Stair Management Technique: Two rails;Step to pattern Number of Stairs: 2   Posture/Postural Control Posture/Postural Control: No significant limitations Balance Balance Assessed: Yes Static Standing Balance Static Standing - Balance Support: No upper extremity supported;During functional activity Static Standing - Level of Assistance: Other (comment) (Min guard) Static Standing - Comment/# of Minutes: 5 minutes.  Patient required min assist for safety. Exercise  General Exercises - Lower Extremity Hip ABduction/ADduction: Strengthening;Both;10 reps;Standing Hip Flexion/Marching: Strengthening;Both;10 reps;Standing Mini-Sqauts: Strengthening;Both;10 reps;Other (comment) (Sit to stand) End of Session PT - End of Session Equipment Utilized During Treatment: Gait belt Activity Tolerance: Patient tolerated treatment well (Pt denied having dizziness or nausea throughout treatment.) Patient left: in bed;with call bell in reach;with family/visitor present General Behavior During Session: [facility] for tasks performed Cognition: [facility] for tasks performed  Ezzard Standing  SPT 06/09/2011, 12:25 PM

## 2011-06-10 DIAGNOSIS — I6330 Cerebral infarction due to thrombosis of unspecified cerebral artery: Secondary | ICD-10-CM

## 2011-06-10 DIAGNOSIS — Z5189 Encounter for other specified aftercare: Secondary | ICD-10-CM

## 2011-06-10 LAB — COMPREHENSIVE METABOLIC PANEL
ALT: 13 U/L (ref 0–35)
Alkaline Phosphatase: 43 U/L (ref 39–117)
CO2: 24 mEq/L (ref 19–32)
GFR calc Af Amer: >90 mL/min (ref >90)
GFR calc non Af Amer: 86 mL/min — ABNORMAL LOW (ref >90)
Glucose, Bld: 93 mg/dL (ref 70–99)
Potassium: 4.1 mEq/L (ref 3.5–5.1)
Sodium: 141 mEq/L (ref 135–145)

## 2011-06-10 LAB — CBC
Hemoglobin: 14.6 g/dL (ref 12.0–15.0)
MCH: 31.8 pg (ref 26.0–34.0)
RBC: 4.59 MIL/uL (ref 3.87–5.11)

## 2011-06-10 LAB — DIFFERENTIAL
Basophils Absolute: 0 10*3/uL (ref 0.0–0.1)
Basophils Relative: 1 % (ref 0–1)
Eosinophils Absolute: 0.2 10*3/uL (ref 0.0–0.7)
Eosinophils Relative: 3 % (ref 0–5)

## 2011-06-10 MED ORDER — SIMVASTATIN 20 MG PO TABS: 20.0000 mg | ORAL_TABLET | Freq: Every day | ORAL | Status: DC

## 2011-06-10 MED ORDER — ASPIRIN 325 MG PO TBEC: 325.0000 mg | DELAYED_RELEASE_TABLET | Freq: Every day | ORAL | Status: AC

## 2011-06-10 NOTE — Progress Notes (Signed)
Patient discharged at 1515 to home with daughter. Marissa Nestle, PA provided discharge instructions. Hedy Camara

## 2011-06-10 NOTE — Evaluation (Signed)
Physical Therapy Assessment and Plan  Patient Details  Name: ANGELLI BARUCH MRN: [id number] Date of Birth: Apr 11, 1940  PT Diagnosis: Difficulty walking Rehab Potential: Excellent ELOS: Eval only; patient at baseline and cleared to D/C today; no further PT needs   Today's Date: 06/10/2011 Time: 1102-1200 Time Calculation (min): 58 min  Problem List:  Patient Active Problem List  Diagnoses  . DEPRESSION  . ALLERGIC RHINITIS  . ASTHMA  . breast cancer, right DCIS s/p R mastectomy 08/20/09  . Near syncope  . CVA (cerebral infarction)-mid/inf cerebellar infarct 3/25    Past Medical History:  Past Medical History  Diagnosis Date  . GERD (gastroesophageal reflux disease)   . Cancer     breast  . Arthritis   . Cough    Past Surgical History:  Past Surgical History  Procedure Date  . Joint replacement july 9th, 2012    left knee replacement   . Collarbone september 2011    pinned and plated  . Breast surgery june 2011    double mastectomy   . Abdominal hysterectomy 1993    Assessment & Plan Clinical Impression: Patient is a 71 y.o. female who presents to [facility] ED with main concern of sudden onset dizziness, vertigo, unsteady gait that occurred today the day of admission. She denies similar episodes in the past and reports today she felt as was going to pass out. Her symptoms were associated with nausea but no vomiting. She denies recent sicknesses or hospitalizations, no fevers, chills, no specific abdominal or urinary concerns, no focal neurologic deficits, no headaches and no other visual changes. CT head without acute changes. MRI brain done with acute infarct in left cerebellum. 2 D echo done revealing EF 55-65%, calcified annulus mitral valve. Neurology recommended continuing ASA for CVA prophylaxis. Patient just returned [place] this weekend and BLE dopplers/ TCD ordered for work up. LE dopplers without DVT. Carotid dopplers without ICA stenosis.  Patient transferred to CIR on  06/09/2011 .   Patient currently is independent with transfers, gait, stair negotiation and requires no supervision or assistance with mobility and has returned to baseline level.  Patient presents with low risk for falls as indicated by Sharlene Motts balance score of 56/56.  Prior to hospitalization, patient was independent with mobility and lived with Alone in a 2 level House with bed and bath on second level.  Home access is 3Stairs to enter.  Patient is at baseline level and is clear for discharge home alone.  Anticipate patient will not need PT follow up at discharge.  PT - End of Session Activity Tolerance: Endurance does not limit participation in activity Endurance Deficit: No PT Assessment Rehab Potential: Excellent Barriers to Discharge: None PT Plan Estimated Length of Stay: Eval only; patient at baseline and cleared to D/C today; no further PT needs PT Treatment/Interventions:  (Evaluation only) PT Recommendation Follow Up Recommendations: None Equipment Recommended: None recommended by PT  PT Evaluation Precautions/Restrictions Precautions Precautions: Fall Precaution Comments: MRSA; bilat mastectomy (h/o breast CA) Pain Pain Assessment Pain Assessment: 0-10 Pain Score:   2 Pain Type: Acute pain Pain Location: Head Pain Orientation: Anterior Pain Descriptors: Headache Pain Onset: Gradual Pain Intervention(s): Other (Comment) (premedicated) Home Living/Prior Functioning Home Living Lives With: Alone Receives Help From: Family Type of Home: House Home Layout: Two level Alternate Level Stairs-Rails: Right Alternate Level Stairs-Number of Steps: 12 Home Access: Stairs to enter Entrance Stairs-Rails: Right Entrance Stairs-Number of Steps: 3 Home Adaptive Equipment: Walker - rolling;Straight cane Prior Function Level  of Independence: Independent with gait;Independent with transfers Able to Take Stairs?: Yes Driving: Yes Leisure: Hobbies-yes (Comment) Comments: Deep  water exercises at [employer], silver sneakers  Cognition Overall Cognitive Status: Appears within functional limits for tasks assessed Sensation Sensation Light Touch: Appears Intact Proprioception: Appears Intact Coordination Gross Motor Movements are Fluid and Coordinated: Yes Heel Shin Test: Slight dysmetria RLE, increased difficulty with LLE secondary to OA Motor  Motor Motor: Within Functional Limits  Mobility Bed Mobility Supine to Sit: 7: Independent Sit to Supine: 7: Independent Transfers Transfers: Yes Sit to Stand: 7: Independent Stand to Sit: 7: Independent Stand Pivot Transfers: 7: Independent; patient also able to independently demonstrate safe floor <> furniture transfer without assistance or cues and safe car transfer without assistance or cues.   Locomotion  Ambulation Ambulation/Gait Assistance: 7: Independent Ambulation Distance (Feet): 300 Feet Assistive device: None Ambulation/Gait Assistance Details (indicate cue type and reason): Slightly antalgic gait secondary to premorbid ankle and hip OA Stairs / Additional Locomotion Stairs: Yes Stairs Assistance: 7: Independent Stairs Assistance Details (indicate cue type and reason): Performed full flight of stairs with R rail independently; had patient demonstrate how she enters/exits her home carrying "groceries"; patient able to independently ascend and descend 5 stairs without rail while holding 10lb in one UE and 7lb in the other. Stair Management Technique: No rails;One rail Right;Alternating pattern;Forwards Number of Stairs: 24  Height of Stairs: 8  Wheelchair Mobility Wheelchair Mobility: No  Trunk/Postural Assessment  Cervical Assessment Cervical Assessment: Within Functional Limits Thoracic Assessment Thoracic Assessment: Within Functional Limits Lumbar Assessment Lumbar Assessment: Within Functional Limits Postural Control Postural Control: Within Functional Limits  Balance Balance Balance Assessed:  Yes Standardized Balance Assessment Standardized Balance Assessment: Berg Balance Test Berg Balance Test Sit to Stand: Able to stand without using hands and stabilize independently Standing Unsupported: Able to stand safely 2 minutes Sitting with Back Unsupported but Feet Supported on Floor or Stool: Able to sit safely and securely 2 minutes Stand to Sit: Sits safely with minimal use of hands Transfers: Able to transfer safely, minor use of hands Standing Unsupported with Eyes Closed: Able to stand 10 seconds safely Standing Ubsupported with Feet Together: Able to place feet together independently and stand 1 minute safely From Standing, Reach Forward with Outstretched Arm: Can reach confidently >25 cm (10") From Standing Position, Pick up Object from Floor: Able to pick up shoe safely and easily From Standing Position, Turn to Look Behind Over each Shoulder: Looks behind from both sides and weight shifts well Turn 360 Degrees: Able to turn 360 degrees safely in 4 seconds or less Standing Unsupported, Alternately Place Feet on Step/Stool: Able to stand independently and safely and complete 8 steps in 20 seconds Standing Unsupported, One Foot in Front: Able to place foot tandem independently and hold 30 seconds Standing on One Leg: Able to lift leg independently and hold > 10 seconds Total Score: 56  Performed higher level balance activities to assess patient's ability to return to community exercise program: standing on BOSU balance dome: 2 sets x 10 reps heel and toe raises, squats, high knee marching with supervision with UE support on parallel bars and side stepping R and L on and off of BOSU Extremity Assessment  RLE Assessment RLE Assessment: Within Functional Limits LLE Assessment LLE Assessment: Within Functional Limits (OA in hip)  See FIM for current functional status  Refer to Care Plan for Long Term Goals  Recommendations for other services: None  Discharge Criteria:  Patient will  be discharged from PT if patient refuses treatment 3 consecutive times without medical reason, if treatment goals not met, if there is a change in medical status, if patient makes no progress towards goals or if patient is discharged from hospital.  The above assessment, treatment plan, treatment alternatives and goals were discussed and mutually agreed upon: by patient  [place] Desanctis 06/10/2011, 12:41 PM

## 2011-06-10 NOTE — Evaluation (Signed)
Occupational Therapy Assessment and Discharge  Patient Details  Name: Felicia Hunter MRN: [id number] Date of Birth: September 17, 1940  OT Diagnosis: eval only; minimal FMC impairment found; patient has returned to overall baseline level and no LTGs set  Today's Date: 06/10/2011 Time: 0930-1030 Time Calculation (min): 60 min  Problem List:  Patient Active Problem List  Diagnoses  . DEPRESSION  . ALLERGIC RHINITIS  . ASTHMA  . breast cancer, right DCIS s/p R mastectomy 08/20/09  . Near syncope  . CVA (cerebral infarction)-mid/inf cerebellar infarct 3/25    Past Medical History:  Past Medical History  Diagnosis Date  . GERD (gastroesophageal reflux disease)   . Cancer     breast  . Arthritis   . Cough    Past Surgical History:  Past Surgical History  Procedure Date  . Joint replacement july 9th, 2012    left knee replacement   . Collarbone september 2011    pinned and plated  . Breast surgery june 2011    double mastectomy   . Abdominal hysterectomy 1993    Assessment & Plan Clinical Impression:  Patient is a 71 y.o. female who presented to [facility] ED with main concern of sudden onset dizziness, vertigo, unsteady gait that occurred the day of admission. She denied similar episodes in the past and reported she felt as was going to pass out. Her symptoms were associated with nausea but no vomiting. She denied recent sicknesses or hospitalizations, no fevers, chills, no specific abdominal or urinary concerns, no focal neurologic deficits, no headaches and no other visual changes. CT head without acute changes. MRI brain done with acute infarct in left cerebellum. 2 D echo done revealing EF 55-65%, calcified annulus mitral valve. Neurology recommended continuing ASA for CVA prophylaxis. Patient just driven from [place] on the weekend and BLE dopplers/ TCD ordered for work up. LE dopplers without DVT. Carotid dopplers without ICA stenosis.  Patient transferred to CIR on 06/09/2011 .   Patient  currently is modified independent with all BADL & IADL tasks, requires no supervision or assistance with BADL & IADL tasks, and has returned to baseline level.  Patient presents with low risk for falls as indicated by Sharlene Motts balance score of 56/56.  Prior to hospitalization, patient was independent with mobility, active in the community, driving, and lived with alone in a 2 level House with bed and bath on second level.  Patient is at baseline level and is clear for discharge home alone.  Anticipate patient will not need OT follow up at discharge.  Equipment Recommended: None recommended by OT  OT Evaluation Precautions/Restrictions  Precaution Comments: MRSA; bilat mastectomy (h/o breast CA) Pain c/o mild headache which did not effect therapy session  Home Living/Prior Functioning Home Living Lives With: Alone (daughter does not work and lives close by) Dolores Lory Help From: Family Type of Home: House Home Layout: Two level Alternate Level Stairs-Rails: Right Alternate Level Stairs-Number of Steps: 12 Home Access: Stairs to enter Entrance Stairs-Rails: Right Entrance Stairs-Number of Steps: 3 Bathroom Shower/Tub: Psychologist, counselling;Door [employer] Toilet: Standard Home Adaptive Equipment: Bedside commode/3-in-1;Walker - rolling;Straight cane;Built-in shower seat Prior Function Level of Independence: Independent with basic ADLs;Independent with homemaking with ambulation;Independent with transfers;Independent with gait Able to Take Stairs?: Yes Driving: Yes Vocation: Retired Leisure: Hobbies-yes (Comment) Comments: water aerobics and Silver Sneakers Vision/Perception  Vision - History Baseline Vision: Wears glasses all the time Patient Visual Report: No change from baseline Vision - Assessment Eye Alignment: Within Functional Limits Vision Assessment: Vision tested  Ocular Range of Motion: Within Functional Limits Tracking/Visual Pursuits: Able to track stimulus in all quads without  difficulty Saccades: Within functional limits Visual Fields: No apparent deficits Additional Comments: unable to evoke dizziness with any of the visual assessment in sit and stand Perception Perception: Within Functional Limits Praxis Praxis: Intact  Cognition Overall Cognitive Status: Appears within functional limits for tasks assessed Orientation Level: Oriented X4 Sensation Sensation Light Touch: Appears Intact (pt. denies numbness/tingling) Stereognosis: Not tested Hot/Cold: Appears Intact Proprioception: Appears Intact Coordination Gross Motor Movements are Fluid and Coordinated: Yes Fine Motor Movements are Fluid and Coordinated: No (LUE slow, RUE decreased accuracy and rhythm) Motor  Motor Motor: Within Functional Limits Mobility  Bed Mobility Supine to Sit: 7: Independent Sit to Supine: 7: Independent Transfers Transfers: Yes Sit to Stand: 7: Independent Stand to Sit: 7: Independent  Trunk/Postural Assessment  Cervical Assessment Cervical Assessment: Within Functional Limits Thoracic Assessment Thoracic Assessment: Within Functional Limits Lumbar Assessment Lumbar Assessment: Within Functional Limits Postural Control Postural Control: Within Functional Limits  Balance Balance Balance Assessed: Yes (Dynamic Balance WFL with BADL/IADL (BERG score with XB=14)) Extremity/Trunk Assessment RUE Assessment RUE Assessment: Exceptions to [facility] RUE AROM (degrees) Overall AROM Right Upper Extremity: Due to premorbid status (arthritis in shoulder, AROM ~90 degrees flexion) LUE Assessment LUE Assessment: Within Functional Limits  See FIM for current functional status  Recommendations for other services: None  Discharge Criteria: Patient will be discharged from OT if patient refuses treatment 3 consecutive times without medical reason, if treatment goals not met, if there is a change in medical status, if patient makes no progress towards goals or if patient is  discharged from hospital.  The above assessment, treatment plan, treatment alternatives and goals were discussed and mutually agreed upon: by patient  Felicia Hunter 06/10/2011, 3:45 PM

## 2011-06-10 NOTE — Progress Notes (Signed)
Patient information reviewed and entered into [employer] system by Dia Jefferys, RN, CRRN, PPS Coordinator.  Information including medical coding and functional independence measure will be reviewed and updated through discharge.     Per nursing patient was given "Data Collection Information Summary for Patients in Inpatient Rehabilitation Facilities with attached "Privacy Act Statement-Health Care Records" upon admission.   

## 2011-06-10 NOTE — Progress Notes (Signed)
Discharge summary # [phone]

## 2011-06-10 NOTE — Discharge Instructions (Signed)
Inpatient Rehab Discharge Instructions  Felicia Hunter Discharge date and time: No discharge date for patient encounter.   Activities/Precautions/ Functional Status: Activity: activity as tolerated Diet: low fat, low cholesterol diet Wound Care: none needed Functional status:  _X__ No restrictions     ___ Walk up steps independently ___ 24/7 supervision/assistance   ___ Walk up steps with assistance ___ Intermittent supervision/assistance  ___ Bathe/dress independently ___ Walk with walker     ___ Bathe/dress with assistance ___ Walk Independently    ___ Shower independently ___ Walk with assistance    ___ Shower with assistance ___ No alcohol     ___ Return to work/school ________  Special Instructions:  STROKE/TIA DISCHARGE INSTRUCTIONS SMOKING Cigarette smoking nearly doubles your risk of having a stroke & is the single most alterable risk factor  If you smoke or have smoked in the last 12 months, you are advised to quit smoking for your health.  Most of the excess cardiovascular risk related to smoking disappears within a year of stopping.  Ask you doctor about anti-smoking medications  [place] Quit Line: 1-800-QUIT NOW  Free Smoking Cessation Classes ([phone]  CHOLESTEROL Know your levels; limit fat & cholesterol in your diet  Lab Results  Component Value Date   CHOL 213* 06/09/2011   HDL 81 06/09/2011   LDLCALC 108* 06/09/2011   TRIG 122 06/09/2011   CHOLHDL 2.6 06/09/2011      Many patients benefit from treatment even if their cholesterol is at goal.  Goal: Total Cholesterol less than 160  Goal:  LDL less than 100  Goal:  HDL greater than 40  Goal:  Triglycerides less than 150  BLOOD PRESSURE American Stroke Association blood pressure target is less that 120/80 mm/Hg  Your discharge blood pressure is:  BP: 118/72 mmHg  Monitor your blood pressure  Limit your salt and alcohol intake  Many individuals will require more than one medication for high blood  pressure  DIABETES (A1c is a blood sugar average for last 3 months) Goal A1c is under 7% (A1c is blood sugar average for last 3 months)  Diabetes: No known diagnosis of diabetes    Lab Results  Component Value Date   HGBA1C 5.8* 06/07/2011    Your A1c can be lowered with medications, healthy diet, and exercise.  Check your blood sugar as directed by your physician  Call your physician if you experience unexplained or low blood sugars.  PHYSICAL ACTIVITY/REHABILITATION Goal is 30 minutes at least 4 days per week    Activity decreases your risk of heart attack and stroke and makes your heart stronger.  It helps control your weight and blood pressure; helps you relax and can improve your mood.  Participate in a regular exercise program.  Talk with your doctor about the best form of exercise for you (dancing, walking, swimming, cycling).  DIET/WEIGHT Goal is to maintain a healthy weight  Your height is:  Height: 5.6" (14.2 cm) Your current weight is: Weight: 61.3 kg (135 lb 2.3 oz) Your body Mass Index (BMI) is:  BMI (Calculated): 3036.2   Following the type of diet specifically designed for you will help prevent another stroke.  Your goal Body Mass Index (BMI) is 19-24.  Healthy food habits can help reduce 3 risk factors for stroke:  High cholesterol, hypertension, and excess weight.        My questions have been answered and I understand these instructions. I will adhere to these goals and the provided educational  materials after my discharge from the hospital.  Patient/Caregiver Signature _______________________________ Date __________  Clinician Signature _______________________________________ Date __________  Please bring this form and your medication list with you to all your follow-up doctor's appointments.

## 2011-06-10 NOTE — Progress Notes (Signed)
Social Work Discharge Note Discharge Note  The overall goal for the admission was met for:   Discharge location: Yes-HOME WITH INTERMITTENT ASSIST  Length of Stay: Yes-1 DAY  Discharge activity level: Yes-INDEPENDENT LEVEL  Home/community participation: Yes  Services provided included: MD, RD, PT, OT, RN, CM, TR, Pharmacy and SW  Financial Services: Medicare and Private Insurance: AARP-SECONDARY  Follow-up services arranged: Other: NO FOLLOW UP RECOMMENDED  Comments (or additional information):PT FEELS HAS RECOVERED FROM THIS EVENT  Patient/Family verbalized understanding of follow-up arrangements: Yes  Individual responsible for coordination of the follow-up plan: [facility] AND PT  Confirmed correct DME delivered: Lucy Chris 06/10/2011    Lucy Chris

## 2011-06-10 NOTE — Progress Notes (Signed)
Social Work Assessment and Plan Social Work Assessment and Plan  Patient Details  Name: Felicia Hunter MRN: [id number] Date of Birth: November 12, 1940  Today's Date: 06/10/2011  Problem List:  Patient Active Problem List  Diagnoses  . DEPRESSION  . ALLERGIC RHINITIS  . ASTHMA  . breast cancer, right DCIS s/p R mastectomy 08/20/09  . Near syncope  . CVA (cerebral infarction)-mid/inf cerebellar infarct 3/25   Past Medical History:  Past Medical History  Diagnosis Date  . GERD (gastroesophageal reflux disease)   . Cancer     breast  . Arthritis   . Cough    Past Surgical History:  Past Surgical History  Procedure Date  . Joint replacement july 9th, 2012    left knee replacement   . Collarbone september 2011    pinned and plated  . Breast surgery june 2011    double mastectomy   . Abdominal hysterectomy 1993   Social History:  reports that she has never smoked. She has never used smokeless tobacco. She reports that she drinks about .6 ounces of alcohol per week. She reports that she does not use illicit drugs.  Family / Support Systems Marital Status: Widow/Widower Patient Roles: Parent;Volunteer Children: Juliette Alcide Morgenstern-daughter  [phone] Other Supports: Friends Anticipated Caregiver: Daughter and other family Ability/Limitations of Caregiver: Daughter works during the day- can assist in the pm Caregiver Availability: Evenings only Family Dynamics: Pt is very close to her family and extended family.  She has numerous friends and church members who are supportive  Social History Preferred language: English Religion: Quaker Cultural Background: No issues Education: [employer] Read: Yes Write: Yes Employment Status: Retired Fish farm manager Issues: No issues Guardian/Conservator: None   Abuse/Neglect Physical Abuse: Denies Verbal Abuse: Denies Sexual Abuse: Denies Exploitation of patient/patient's resources: Denies Self-Neglect:  Denies  Emotional Status Pt's affect, behavior adn adjustment status: Pt is very motivated and feels she is ready to go home.  She feels she recovered within 24 hours after admission to hospital.  She is very pleased with her recovery and progress. Recent Psychosocial Issues: Other medical issues Pyschiatric History: History-depression does not take anything currently.  Feels she is doing ok with her coping. Beck Depression Screen score- 1.  She wants to go home as soon as possible. Substance Abuse History: No issues  Patient / Family Perceptions, Expectations & Goals Pt/Family understanding of illness & functional limitations: Pt and son in-law have a good understanding of her condition.  She feels she has recovered.  Wants to be discharged. Premorbid pt/family roles/activities: Mother, Grandmother, Retiree, Church Member, Social worker, etc Anticipated changes in roles/activities/participation: Plans to resume at discharge Pt/family expectations/goals: Pt states: " I feel recovered and ready to go home. "  Son in-law states: " She is amazing, always been very active and plans to continue to be."  [employer]: None Premorbid Home Care/DME Agencies: None Transportation available at discharge: [employer] referrals recommended: Support group (specify) (CVA Support Group)  Discharge Planning Living Arrangements: Alone Support Systems: Children;Family members;Church/faith community;Friends/neighbors Type of Residence: Private residence Insurance Resources: Administrator (specify) Youth worker) Financial Resources: Restaurant manager, fast food Screen Referred: No Living Expenses: Own Money Management: Patient Do you have any problems obtaining your medications?: No Home Management: Patient Patient/Family Preliminary Plans: Return home with family checking in on her.  She feels like she did prior to admisison-fine.  Feels deficits are  gone. Social Work Anticipated Follow Up Needs: HH/OP;Support Group Expected length of stay: Short length  of stay-high level and pt's preference  Clinical Impression Very active high level lady.  Confident she is back to baseline and wants to return very soon.  Family to be in and out checking on her at discharge.  Lucy Chris 06/10/2011, 10:58 AM

## 2011-06-10 NOTE — Progress Notes (Signed)
Subjective/Complaints: headache  Objective: Vital Signs: Blood pressure 118/72, pulse 60, temperature 98.4 F (36.9 C), temperature source Oral, resp. rate 18, height 5.6" (0.142 m), weight 61.3 kg (135 lb 2.3 oz), SpO2 94.00%. Last 5 Results:  Lab 06/10/11 0620 06/07/11 1926 06/07/11 1716  WBC 6.5 -- 9.7  HGB 14.6 15.0 14.4  HCT 44.5 44.0 42.5  PLT 237 -- 258  NA 141 138 136  K 4.1 3.6 3.6  CL 106 108 100  CO2 24 -- 24  GLUCOSE 93 142* 140*  BUN 17 21 19   CREATININE 0.69 0.80 0.69  CALCIUM 9.5 -- 10.7*    Physical Exam: General appearance: alert and appears stated age Resp: clear to auscultation bilaterally Cardio: regular rate and rhythm, S1, S2 normal, no murmur, click, rub or gallop GI: soft, non-tender; bowel sounds normal; no masses,  no organomegaly Extremities: extremities normal, atraumatic, no cyanosis or edema Pulses: 2+ and symmetric Skin: Skin color, texture, turgor normal. No rashes or lesions Neurologic: Mild dysmetria L Heel shin, RUE strength limited by shoulder pain abdomen is soft without significant tenderness, masses, organomegaly or guarding Assessment/Plan: 1. Functional deficits secondary to L cerebellar infarct which require 3+ hours per day of interdisciplinary therapy in a comprehensive inpatient rehab setting. Physiatrist is providing close team supervision and 24 hour management of active medical problems listed below. Physiatrist and rehab team continue to assess barriers to discharge/monitor patient progress toward functional and medical goals. FIM:       FIM - Toileting Toileting steps completed by patient: Adjust clothing prior to toileting;Performs perineal hygiene;Adjust clothing after toileting Toileting Assistive Devices: Grab bar or rail for support Toileting: 4: Steadying assist  FIM - Archivist Transfers: 4-To toilet/BSC: Min A (steadying Pt. > 75%)  FIM - Bed/Chair Transfer Bed/Chair Transfer: 5: Supine > Sit:  Supervision (verbal cues/safety issues);5: Sit > Supine: Supervision (verbal cues/safety issues);4: Bed > Chair or W/C: Min A (steadying Pt. > 75%);4: Chair or W/C > Bed: Min A (steadying Pt. > 75%)     Comprehension Comprehension Mode: Auditory;Visual Comprehension: 7-Follows complex conversation/direction: With no assist  Expression Expression Mode: Verbal Expression: 7-Expresses complex ideas: With no assist  Social Interaction Social Interaction: 7-Interacts appropriately with others - No medications needed.  Problem Solving Problem Solving: 7-Solves complex problems: Recognizes & self-corrects  Memory Memory: 7-Complete Independence: No helper  Medical Problem List and Plan:   2. DVT Prophylaxis/Anticoagulation: Pharmaceutical: Lovenox   3. Pain Management: Mild headaches treated with prn tylenol. We'll monitor for further need.   4. Mood: in better spirits today. Will monitor for now. She is quite anxious to return home. Cursory use her energy in a positive fashion  5. Dyslipidemia:  Statin added for management.   6. Nausea: resolved. Ate 100% breakfast this am.      1  Love, Felicia Hunter 06/10/2011, 8:25 AM

## 2011-06-10 NOTE — Progress Notes (Signed)
Inpatient Rehabilitation Center Individual Statement of Services  Patient Name:  Felicia Hunter  Date:  06/10/2011  Welcome to the Inpatient Rehabilitation Center.  Our goal is to provide you with an individualized program based on your diagnosis and situation, designed to meet your specific needs.  With this comprehensive rehabilitation program, you will be expected to participate in at least 3 hours of rehabilitation therapies Monday-Friday, with modified therapy programming on the weekends.  Your rehabilitation program will include the following services:  Physical Therapy (PT), Occupational Therapy (OT), 24 hour per day rehabilitation nursing, Case Management (RN and Social Worker), Rehabilitation Medicine, Nutrition Services and Pharmacy Services  Weekly team conferences will be held on  Wednesday to discuss your progress.  Your RN Case Designer, television/film set will talk with you frequently to get your input and to update you on team discussions.  Team conferences with you and your family in attendance may also be held.  Expected length of stay: @3days  Overall anticipated outcome: Modified Independent  Depending on your progress and recovery, your program may change.  Your RN Case Estate agent will coordinate services and will keep you informed of any changes.  Your RN Sports coach and SW names and contact numbers are listed  below.  The following services may also be recommended but are not provided by the Inpatient Rehabilitation Center:   Driving Evaluations  Home Health Rehabiltiation Services  Outpatient Rehabilitatation [facility]  Vocational Rehabilitation   Arrangements will be made to provide these services after discharge if needed.  Arrangements include referral to agencies that provide these services.  Your insurance has been verified to be:  Medicare + AARP Your primary doctor is:  Dr. Merri Brunette  Pertinent information will be shared with your doctor and  your insurance company.  Case Manager: Melanee Spry, [facility] [phone]  Social Worker:  Dossie Der, [place] [phone]  Information discussed with and copy given to patient by: Brock Ra, 06/10/2011, 11:58 AM

## 2011-06-10 NOTE — Progress Notes (Signed)
Social Work Patient ID: Felicia Hunter, female   DOB: 14-Jun-1940, 71 y.o.   MRN: [id number] Team feels pt has met independent level goals and are ready to discharge end of the day. No follow up recommended and no DME.  Anne-RNCM to contact MD.  Elita Quick -PA aware of Possible discharge today.  Awaiting MD's clearance.

## 2011-06-11 NOTE — Discharge Summary (Signed)
Felicia Hunter, [name] NO.:  [id number]  MEDICAL RECORD NO.:  [id number]  LOCATION:  4030                         FACILITY:  [facility]  PHYSICIAN:  Erick Colace, M.D.DATE OF BIRTH:  [birth year]  DATE OF ADMISSION:  06/09/2011 DATE OF DISCHARGE:  06/10/2011                              DISCHARGE SUMMARY   DISCHARGE DIAGNOSES: 1. Left cerebellar infarct. 2. Dyslipidemia. 3. Gastroesophageal reflux disease.  HISTORY OF PRESENT ILLNESS:  [name] is a 71 year old female who was admitted on June 07, 2011, with that dizziness, vertigo, and difficulty walking.  Workup done revealed left cerebral infarct. Neurology recommended aspirin for CVA prophylaxis.  Therapies were initiated and the patient was noted to have impairments in mobility. Rehab was consulted for progressive therapies.  PAST MEDICAL HISTORY:  Positive for: 1. GERD. 2. Breast cancer with bilateral mastectomy. 3. Abdominal hysterectomy. 4. Left total knee replacement.  FUNCTIONAL HISTORY:  The patient was independent prior to admission. She is very active.  Does not use an assistive device.  She is a Futures trader.  She still drives.  FUNCTIONAL STATUS:  The patient was min-assist for transfers, min-assist ambulating 200 feet with a rolling walker with decrease step length.  OT evaluation revealed the patient at min-assist for bathing, dressing tasks.  HOSPITAL COURSE:  Felicia Hunter was admitted to Rehab on June 09, 2011, for inpatient therapies to consist of PT, OT at least 3 hours daily.  Past-admission, the patient's blood pressures were checked on b.i.d. basis.  These were noted to be well controlled.  Blood pressure at time of discharge is at 118/72.  Routine labs done past-admission revealed hemoglobin 14.6, hematocrit 44.5, white count 6.5, platelets 237.  Check of lytes revealed sodium 141, potassium 4.1, chloride 106, CO2 24, BUN 17, creatinine 0.69. Therapy evaluations  were done on June 10, 2011, and the patient was noted to be independent for transfers as well as mobility.  She was able to ambulate 300 feet without assistive device.  She had slightly antalgic gait due to premorbid ankle and hip OA.  Berg balance testing revealed the patient's Berg score at 56/56.  OT evaluation revealed the patient at independent level for ADLs.  The patient reported that her symptoms of nausea as well as dizziness had completely resolved.  She continues to have some mild issues with headaches that are treated with Tylenol on p.r.n. basis.  No further followup therapies indicated at this time.  The patient was discharged to home on June 10, 2011.  DISCHARGE MEDICATIONS: 1. Coated aspirin 325 mg a day. 2. Zocor 20 mg q.p.m. 3. Continue biotin 1 p.o. per day. 4. Calcium 600 mg a day. 5. Vitamin D 1000 units per day. 6. Glucosamine and chondroitin 1 p.o. b.i.d. 7. Multivitamin 1 per day. 8. Fish oil 1 p.o. per day. 9. Ambien 10 mg at bedtime p.r.n.  DIET:  Low fat, low cholesterol.  ACTIVITY: As tolerated.  SPECIAL INSTRUCTIONS:  Avoid strenuous activity for now.  Do not use Aleve.  FOLLOWUP:  The patient to follow up with Dr. Merri Brunette on June 22, 2011 at 2:45 p.m.  Follow up with Dr. Pearlean Brownie in 2  months.     Delle Reining, P.A.   ______________________________ Erick Colace, M.D.    PL/MEDQ  D:  06/10/2011  T:  06/11/2011  Job:  [id number]  cc:   Soyla Murphy. [name], M.D. Pramod P. Pearlean Brownie, MD

## 2011-06-11 NOTE — Patient Care Conference (Signed)
Inpatient RehabilitationTeam Conference Note Date: 06/10/2011   Time: 10:35 AM    Patient Name: Felicia Hunter      Medical Record Number: [id number]  Date of Birth: [birth year] Sex: Female         Room/Bed: 4030/4030-01 Payor Info: Payor: MEDICARE  Plan: MEDICARE PART A AND B  Product Type: *No Product type*     Admitting Diagnosis: LT CVA   Admit Date/Time:  06/09/2011  2:09 PM Admission Comments: No comment available   Primary Diagnosis:  CVA (cerebral infarction) Principal Problem: CVA (cerebral infarction)  Patient Active Problem List  Diagnoses Date Noted  . CVA (cerebral infarction)-mid/inf cerebellar infarct 3/25 06/08/2011  . Near syncope 06/07/2011  . 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 DEPRESSION 10/28/2006  . ALLERGIC RHINITIS 10/28/2006  . ASTHMA 10/28/2006    Expected Discharge Date: Expected Discharge Date: 06/10/11  Team Members Present: Physician: Dr. Claudette Laws Case Manager Present: Melanee Spry, RN Social Worker Present: Dossie Der, LCSW Nurse Present: Laural Roes, RN PT Present: Edman Circle, PT OT Present: Bretta Bang, OT;Pleas Patricia, OT     Current Status/Progress Goal Weekly Team Focus  Medical   No medical issues, insomnia, dyslipidemia  Maintain medical stability  Discharge planning   Bowel/Bladder   continent bowel/bladder  remain continent mod independence  remaiin continent mod independence   Swallow/Nutrition/ Hydration             ADL's   Independent - Modified Independent with all BADL & IADL  No goals set secondary to patient returned to overall baseline level  Evaluation Only   Mobility   Independent with transfers, gait, stairs  No goals set secondary to patient returned to baseline independent level  Eval only   Communication             Safety/Cognition/ Behavioral Observations            Pain   no complaints of pain  no complaints of pain  monitor   Skin   no skin issues  no skin  issues  no skin issues      *See Interdisciplinary Assessment and Plan and progress notes for long and short-term goals  Barriers to Discharge: None    Possible Resolutions to Barriers:  None    Discharge Planning/Teaching Needs:  Home with family checking in and out on. High level very short length of stay      Team Discussion: Pt progressing rapidly-ready for d/c.  Pt plans to get an emergency alert device.   Revisions to Treatment Plan: none    Continued Need for Acute Rehabilitation Level of Care: The patient requires daily medical management by a physician with specialized training in physical medicine and rehabilitation for the following conditions: Daily direction of a multidisciplinary physical rehabilitation program to ensure safe treatment while eliciting the highest outcome that is of practical value to the patient.: Yes Daily medical management of patient stability for increased activity during participation in an intensive rehabilitation regime.: Yes Daily analysis of laboratory values and/or radiology reports with any subsequent need for medication adjustment of medical intervention for : Neurological problems  Brock Ra 06/11/2011, 10:35 AM

## 2011-06-22 DIAGNOSIS — I10 Essential (primary) hypertension: Secondary | ICD-10-CM | POA: Diagnosis not present

## 2011-06-22 DIAGNOSIS — I619 Nontraumatic intracerebral hemorrhage, unspecified: Secondary | ICD-10-CM | POA: Diagnosis not present

## 2011-06-22 DIAGNOSIS — Z79899 Other long term (current) drug therapy: Secondary | ICD-10-CM | POA: Diagnosis not present

## 2011-06-22 DIAGNOSIS — G4700 Insomnia, unspecified: Secondary | ICD-10-CM | POA: Diagnosis not present

## 2011-06-25 ENCOUNTER — Encounter (INDEPENDENT_AMBULATORY_CARE_PROVIDER_SITE_OTHER): Payer: Self-pay | Admitting: General Surgery

## 2011-06-25 ENCOUNTER — Ambulatory Visit (INDEPENDENT_AMBULATORY_CARE_PROVIDER_SITE_OTHER): Payer: Medicare Other | Admitting: General Surgery

## 2011-06-25 VITALS — BP 116/78 | HR 72 | Temp 98.4°F | Resp 20 | Ht 66.0 in | Wt 135.4 lb

## 2011-06-25 DIAGNOSIS — Z853 Personal history of malignant neoplasm of breast: Secondary | ICD-10-CM

## 2011-06-25 NOTE — Progress Notes (Signed)
Operation:  Bilateral mastectomies for DCIS  Date:  10/09/2009  Stage: Tis  Hormone receptor status: positive  HPI:  Felicia Hunter is here for long-term followup of her right breast DCIS. She denies any chest wall nodules. She denies any axillary or cervical adenopathy.  She sold her house in [place].  She recently has a cerebellar CVA, however, she feels fine now.  PE:  Felicia Hunter looks well and in no acute distress.  Chest-bilateral chest wall scars are present; no nodules are palpable. No suspicious skin changes are noted.  Nodes- no axillary, supraclavicular, or cervical adenopathy  Assessment:  Ductal carcinoma in situ of right breast status post bilateral mastectomies-no evidence of recurrence clinically  Plan:  Return visit in 3 months.

## 2011-06-25 NOTE — Patient Instructions (Signed)
Call if you feel any new nodules on your chest wall. 

## 2011-07-12 DIAGNOSIS — H43819 Vitreous degeneration, unspecified eye: Secondary | ICD-10-CM | POA: Diagnosis not present

## 2011-07-12 DIAGNOSIS — H259 Unspecified age-related cataract: Secondary | ICD-10-CM | POA: Diagnosis not present

## 2011-07-12 DIAGNOSIS — H04129 Dry eye syndrome of unspecified lacrimal gland: Secondary | ICD-10-CM | POA: Diagnosis not present

## 2011-07-20 DIAGNOSIS — Z79899 Other long term (current) drug therapy: Secondary | ICD-10-CM | POA: Diagnosis not present

## 2011-08-30 DIAGNOSIS — E785 Hyperlipidemia, unspecified: Secondary | ICD-10-CM | POA: Diagnosis not present

## 2011-08-30 DIAGNOSIS — I6350 Cerebral infarction due to unspecified occlusion or stenosis of unspecified cerebral artery: Secondary | ICD-10-CM | POA: Diagnosis not present

## 2011-10-27 ENCOUNTER — Ambulatory Visit: Payer: Medicare Other | Admitting: Family

## 2011-10-27 ENCOUNTER — Ambulatory Visit (HOSPITAL_BASED_OUTPATIENT_CLINIC_OR_DEPARTMENT_OTHER): Payer: Medicare Other | Admitting: Hematology & Oncology

## 2011-10-27 VITALS — BP 124/73 | HR 62 | Temp 97.0°F | Resp 18 | Ht 66.0 in | Wt 138.0 lb

## 2011-10-27 DIAGNOSIS — M810 Age-related osteoporosis without current pathological fracture: Secondary | ICD-10-CM

## 2011-10-27 DIAGNOSIS — D0590 Unspecified type of carcinoma in situ of unspecified breast: Secondary | ICD-10-CM | POA: Diagnosis not present

## 2011-10-27 DIAGNOSIS — L80 Vitiligo: Secondary | ICD-10-CM

## 2011-10-27 DIAGNOSIS — G459 Transient cerebral ischemic attack, unspecified: Secondary | ICD-10-CM

## 2011-10-27 DIAGNOSIS — M25519 Pain in unspecified shoulder: Secondary | ICD-10-CM

## 2011-10-27 DIAGNOSIS — D0510 Intraductal carcinoma in situ of unspecified breast: Secondary | ICD-10-CM

## 2011-10-27 NOTE — Progress Notes (Signed)
This office note has been dictated.

## 2011-10-28 NOTE — Progress Notes (Signed)
CC:   Felicia Hunter. Felicia Hunter, M.D.  DIAGNOSIS:  Ductal carcinoma in situ of the right breast.  CURRENT THERAPY:  Observation.  INTERIM HISTORY:  Felicia Hunter comes in for followup.  We see her yearly. She, unfortunately, was hospitalized back I think in March.  She apparently had a TIA.  This appeared to the involving the posterior part of the brain.  She did go to rehab briefly, but she has total resolution of all her symptoms.  She is now on simvastatin.  She is on aspirin at a full dose.  She does have vitiligo.  This has been somewhat progressive.  She does have some discomfort in the left knee.  She had arthritis in the left knee and had a total knee replacement.  She also complains of pain a in the right shoulder.  She has bilateral mastectomy, so there is no indication for mammograms for her.  She has had no bleeding.  She has had no nausea or vomiting.  There has been no change in bowel or bladder habits.  She did have a biopsy of a right chest wall nodule last year in May. The pathology report ([id number]) showed skeletal muscle inflammation with some foreign body giant cells.  She has had no headache.  Again, she had this TIA.  There is no double vision or blurred vision.  She seems to be totally recovered from this.  PHYSICAL EXAMINATION:  This is a thin but well-nourished white female in no obvious distress.  Vital signs:  Temperature of 97, pulse 62, respiratory rate 18, blood pressure is 124/73.  Weight is 138.  Head and neck:  Normocephalic, atraumatic skull.  There are no ocular or oral lesions.  There are no palpable cervical or supraclavicular lymph nodes. Lungs:  Clear bilaterally.  Cardiac:  Regular rate and rhythm with a normal S1 and S2.  There are no murmurs, rubs or bruits.  Breasts: Bilateral mastectomies.  No chest wall nodules, erythema or swelling are noted.  There is no adenopathy in the axillae bilaterally.  Abdomen: Soft with good bowel sounds.  There is  no palpable abdominal mass. There is no palpable hepatosplenomegaly.  Back:  No kyphosis.  There is no tenderness over the spine, ribs, or hips.  Extremities:  No clubbing, cyanosis or edema.  She has decreased range of motion of the right shoulder.  Neurological:  No focal neurological deficits.  LABORATORY DATA:  Not done this visit.  IMPRESSION:  Felicia Hunter is a nice 71 year old white female with ductal carcinoma in situ of the right breast.  She underwent bilateral mastectomies, I think back in July 2011.  She has had no problems with this.  I feel bad that she did have the transient ischemic attack.  Hopefully, with aspirin and statin drug, this will not be an issue in the future.  We will plan to see her back in 1 year.  I do not see that we need any x- ray studies in between visits.    ______________________________ Josph Macho, M.D. PRE/MEDQ  D:  10/27/2011  T:  10/28/2011  Job:  [id number]

## 2011-11-09 ENCOUNTER — Encounter: Payer: Self-pay | Admitting: Gastroenterology

## 2011-12-13 DIAGNOSIS — M949 Disorder of cartilage, unspecified: Secondary | ICD-10-CM | POA: Diagnosis not present

## 2011-12-13 DIAGNOSIS — Z0000 Encounter for general adult medical examination without abnormal findings: Secondary | ICD-10-CM | POA: Diagnosis not present

## 2011-12-13 DIAGNOSIS — I619 Nontraumatic intracerebral hemorrhage, unspecified: Secondary | ICD-10-CM | POA: Diagnosis not present

## 2011-12-13 DIAGNOSIS — Z79899 Other long term (current) drug therapy: Secondary | ICD-10-CM | POA: Diagnosis not present

## 2011-12-13 DIAGNOSIS — I10 Essential (primary) hypertension: Secondary | ICD-10-CM | POA: Diagnosis not present

## 2011-12-16 DIAGNOSIS — M159 Polyosteoarthritis, unspecified: Secondary | ICD-10-CM | POA: Diagnosis not present

## 2011-12-16 DIAGNOSIS — Z0000 Encounter for general adult medical examination without abnormal findings: Secondary | ICD-10-CM | POA: Diagnosis not present

## 2011-12-16 DIAGNOSIS — R1310 Dysphagia, unspecified: Secondary | ICD-10-CM | POA: Diagnosis not present

## 2011-12-16 DIAGNOSIS — I059 Rheumatic mitral valve disease, unspecified: Secondary | ICD-10-CM | POA: Diagnosis not present

## 2011-12-16 DIAGNOSIS — E7800 Pure hypercholesterolemia, unspecified: Secondary | ICD-10-CM | POA: Diagnosis not present

## 2011-12-16 DIAGNOSIS — Z23 Encounter for immunization: Secondary | ICD-10-CM | POA: Diagnosis not present

## 2011-12-16 DIAGNOSIS — Z1212 Encounter for screening for malignant neoplasm of rectum: Secondary | ICD-10-CM | POA: Diagnosis not present

## 2011-12-17 ENCOUNTER — Encounter: Payer: Self-pay | Admitting: Gastroenterology

## 2012-01-04 ENCOUNTER — Encounter (INDEPENDENT_AMBULATORY_CARE_PROVIDER_SITE_OTHER): Payer: Medicare Other | Admitting: General Surgery

## 2012-01-05 ENCOUNTER — Encounter (INDEPENDENT_AMBULATORY_CARE_PROVIDER_SITE_OTHER): Payer: Self-pay | Admitting: General Surgery

## 2012-01-05 ENCOUNTER — Ambulatory Visit (INDEPENDENT_AMBULATORY_CARE_PROVIDER_SITE_OTHER): Payer: Medicare Other | Admitting: General Surgery

## 2012-01-05 VITALS — BP 120/68 | HR 72 | Temp 98.8°F | Resp 16 | Ht 66.0 in | Wt 139.4 lb

## 2012-01-05 DIAGNOSIS — Z853 Personal history of malignant neoplasm of breast: Secondary | ICD-10-CM | POA: Diagnosis not present

## 2012-01-05 NOTE — Patient Instructions (Signed)
Call if you feel any new nodules in your chest wall.

## 2012-01-05 NOTE — Progress Notes (Signed)
Operation:  Bilateral mastectomies for DCIS  Date:  10/09/2009  Stage: Tis  Hormone receptor status: positive  HPI:  Felicia Hunter is here for long-term followup of her right breast DCIS. She denies any chest wall nodules. She denies any axillary or cervical adenopathy. She has been having problems with her right knee and is scheduled to see Dr. Charlann Boxer next month. PE:  Felicia Hunter looks well and in no acute distress.  Chest-bilateral chest wall scars are present; no nodules are palpable. No suspicious skin changes are noted.  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

## 2012-01-10 DIAGNOSIS — I6350 Cerebral infarction due to unspecified occlusion or stenosis of unspecified cerebral artery: Secondary | ICD-10-CM | POA: Diagnosis not present

## 2012-01-10 DIAGNOSIS — E785 Hyperlipidemia, unspecified: Secondary | ICD-10-CM | POA: Diagnosis not present

## 2012-01-17 ENCOUNTER — Encounter: Payer: Self-pay | Admitting: Gastroenterology

## 2012-01-17 ENCOUNTER — Ambulatory Visit (INDEPENDENT_AMBULATORY_CARE_PROVIDER_SITE_OTHER): Payer: Medicare Other | Admitting: Gastroenterology

## 2012-01-17 VITALS — BP 116/60 | HR 84 | Ht 65.0 in | Wt 139.2 lb

## 2012-01-17 DIAGNOSIS — R1319 Other dysphagia: Secondary | ICD-10-CM

## 2012-01-17 DIAGNOSIS — Z86010 Personal history of colonic polyps: Secondary | ICD-10-CM | POA: Diagnosis not present

## 2012-01-17 MED ORDER — PEG-KCL-NACL-NASULF-NA ASC-C 100 G PO SOLR: 1.0000 | Freq: Once | ORAL | Status: DC

## 2012-01-17 NOTE — Progress Notes (Signed)
History of Present Illness: This is a 71 year old female who relates intermittent difficulty swallowing me. Occasionally meat will temporarily stick. Her symptoms have been present for about 2 years and has not been progressive. She notes rare episodes of regurgitation and heartburn. She has had a chronic cough and occasionally notes coughing is related to recumbency. Denies weight loss, abdominal pain, constipation, diarrhea, change in stool caliber, melena, hematochezia, nausea, vomiting, chest pain.  Allergies  Allergen Reactions  . Sulfonamide Derivatives Rash   Outpatient Prescriptions Prior to Visit  Medication Sig Dispense Refill  . aspirin 325 MG tablet Take 325 mg by mouth daily.      Marland Kitchen atorvastatin (LIPITOR) 20 MG tablet 20 mg daily.       Marland Kitchen BIOTIN PO Take 1 tablet by mouth daily.      . calcium carbonate (CALCIUM 600) 600 MG TABS Take 600 mg by mouth daily.      . cholecalciferol (VITAMIN D) 400 UNITS TABS Take 1,000 Units by mouth daily.      Marland Kitchen glucosamine-chondroitin 500-400 MG tablet Take 1 tablet by mouth 2 (two) times daily.      . Multiple Vitamin (MULITIVITAMIN WITH MINERALS) TABS Take 1 tablet by mouth daily.      Marland Kitchen omega-3 acid ethyl esters (LOVAZA) 1 G capsule Take 2 g by mouth 2 (two) times daily.      Marland Kitchen zolpidem (AMBIEN) 10 MG tablet Take 10 mg by mouth at bedtime as needed. sleep       Last reviewed on 01/17/2012 10:56 AM by Meryl Dare, MD,FACG Past Medical History  Diagnosis Date  . GERD (gastroesophageal reflux disease)   . Breast cancer   . Arthritis   . Cough   . Stroke 06/07/11  . Tubulovillous adenoma of colon 08/2003  . Diverticulosis   . Hyperlipidemia   . Osteopenia   . Alopecia   . MVP (mitral valve prolapse)   . Clavicle fracture   . Disorders of bilirubin excretion    Past Surgical History  Procedure Date  . Joint replacement july 9th, 2012    left knee replacement   . Collarbone september 2011    pinned and plated, right  . Breast  surgery june 2011    double mastectomy   . Abdominal hysterectomy 1993  . Vein surgery 1974    left leg  . Nasal septum surgery   . Tonsillectomy    History   Social History  . Marital Status: Widowed    Spouse Name: N/A    Number of Children: 2  . Years of Education: N/A   Occupational History  . retired    Social History Main Topics  . Smoking status: Never Smoker   . Smokeless tobacco: Never Used  . Alcohol Use: 0.6 oz/week    1 Glasses of wine per week     Comment: 1 glass of wine per night  . Drug Use: No  . Sexually Active:    Other Topics Concern  . None   Social History Narrative  . None   Family History  Problem Relation Age of Onset  . Aneurysm Father        Review of Systems: Pertinent positive and negative review of systems were noted in the above HPI section. All other review of systems were otherwise negative.  Current Medications, Allergies, Past Medical History, Past Surgical History, Family History and Social History were reviewed in [employer] record.  Physical Exam: General: Well developed ,  well nourished, no acute distress Head: Normocephalic and atraumatic Eyes:  sclerae anicteric, EOMI Ears: Normal auditory acuity Mouth: No deformity or lesions Neck: Supple, no masses or thyromegaly Lungs: Clear throughout to auscultation Heart: Regular rate and rhythm; no murmurs, rubs or bruits Abdomen: Soft, non tender and non distended. No masses, hepatosplenomegaly or hernias noted. Normal Bowel sounds Rectal: deferred to colonoscopy Musculoskeletal: Symmetrical with no gross deformities  Skin: No lesions on visible extremities Pulses:  Normal pulses noted Extremities: No clubbing, cyanosis, edema or deformities noted Neurological: Alert oriented x 4, grossly nonfocal Cervical Nodes:  No significant cervical adenopathy Inguinal Nodes: No significant inguinal adenopathy Psychological:  Alert and cooperative. Normal mood  and affect  Assessment and Recommendations:  1. Dysphagia, intermittent to solid food. Possible GERD with LPR. Rule out esophageal stricture or motility disorder. Her dysphagia may be related to GERD. I offered to begin a daily PPI and she declines preferring to wait for the results of her testing befor any new medications are prescribed. Schedule barium esophagram. Schedule upper endoscopy with possible dilation. The risks, benefits, and alternatives to endoscopy with possible biopsy and possible dilation were discussed with the patient and they consent to proceed.   2. Personal history of tubulovillous adenomatous colon polyps. She is due for a five-year surveillance colonoscopy. Schedule colonoscopy.  The risks, benefits, and alternatives to colonoscopy with possible biopsy and possible polypectomy were discussed with the patient and they consent to proceed.

## 2012-01-17 NOTE — Patient Instructions (Addendum)
You have been scheduled for a Barium Esophogram at [facility] (1st floor of the hospital) on 01/20/12 at 11:30am . Please arrive 15 minutes prior to your appointment for registration. Make certain not to have anything to eat or drink 6 hours prior to your test. If you need to reschedule for any reason, please contact radiology at [phone] to do so. __________________________________________________________________ A barium swallow is an examination that concentrates on views of the esophagus. This tends to be a double contrast exam (barium and two liquids which, when combined, create a gas to distend the wall of the oesophagus) or single contrast (non-ionic iodine based). The study is usually tailored to your symptoms so a good history is essential. Attention is paid during the study to the form, structure and configuration of the esophagus, looking for functional disorders (such as aspiration, dysphagia, achalasia, motility and reflux) EXAMINATION You may be asked to change into a gown, depending on the type of swallow being performed. A radiologist and radiographer will perform the procedure. The radiologist will advise you of the type of contrast selected for your procedure and direct you during the exam. You will be asked to stand, sit or lie in several different positions and to hold a small amount of fluid in your mouth before being asked to swallow while the imaging is performed .In some instances you may be asked to swallow barium coated marshmallows to assess the motility of a solid food bolus. The exam can be recorded as a digital or video fluoroscopy procedure. POST PROCEDURE It will take 1-2 days for the barium to pass through your system. To facilitate this, it is important, unless otherwise directed, to increase your fluids for the next 24-48hrs and to resume your normal diet.  This test typically takes about 30 minutes to  perform. __________________________________________________________________________________   Felicia Hunter have been scheduled for an endoscopy and colonoscopy with propofol. Please follow the written instructions given to you at your visit today. Please pick up your prep at the pharmacy within the next 1-3 days. If you use inhalers (even only as needed) or a CPAP machine, please bring them with you on the day of your procedure.  cc: Merri Brunette, MD

## 2012-01-20 ENCOUNTER — Ambulatory Visit (HOSPITAL_COMMUNITY)
Admission: RE | Admit: 2012-01-20 | Discharge: 2012-01-20 | Disposition: A | Discharge status: Home / Self Care | Payer: Medicare Other | Source: Ambulatory Visit | Attending: Gastroenterology | Admitting: Gastroenterology

## 2012-01-20 DIAGNOSIS — R1310 Dysphagia, unspecified: Secondary | ICD-10-CM | POA: Diagnosis not present

## 2012-01-20 DIAGNOSIS — R1319 Other dysphagia: Secondary | ICD-10-CM

## 2012-02-18 DIAGNOSIS — M259 Joint disorder, unspecified: Secondary | ICD-10-CM | POA: Diagnosis not present

## 2012-02-19 DIAGNOSIS — M259 Joint disorder, unspecified: Secondary | ICD-10-CM | POA: Diagnosis not present

## 2012-02-21 ENCOUNTER — Ambulatory Visit (AMBULATORY_SURGERY_CENTER): Payer: Medicare Other | Admitting: Gastroenterology

## 2012-02-21 ENCOUNTER — Encounter: Payer: Self-pay | Admitting: Gastroenterology

## 2012-02-21 VITALS — BP 140/101 | HR 58 | Temp 96.6°F | Resp 17 | Wt 139.0 lb

## 2012-02-21 DIAGNOSIS — R1319 Other dysphagia: Secondary | ICD-10-CM

## 2012-02-21 DIAGNOSIS — D128 Benign neoplasm of rectum: Secondary | ICD-10-CM

## 2012-02-21 DIAGNOSIS — D129 Benign neoplasm of anus and anal canal: Secondary | ICD-10-CM | POA: Diagnosis not present

## 2012-02-21 DIAGNOSIS — Z1211 Encounter for screening for malignant neoplasm of colon: Secondary | ICD-10-CM | POA: Diagnosis not present

## 2012-02-21 DIAGNOSIS — D126 Benign neoplasm of colon, unspecified: Secondary | ICD-10-CM

## 2012-02-21 DIAGNOSIS — Z86010 Personal history of colonic polyps: Secondary | ICD-10-CM | POA: Diagnosis not present

## 2012-02-21 DIAGNOSIS — I059 Rheumatic mitral valve disease, unspecified: Secondary | ICD-10-CM | POA: Diagnosis not present

## 2012-02-21 MED ORDER — SODIUM CHLORIDE 0.9 % IV SOLN: 500.0000 mL | INTRAVENOUS | Status: DC

## 2012-02-21 NOTE — Progress Notes (Signed)
Called to room to assist during endoscopic procedure.  Patient ID and intended procedure confirmed with present staff. Received instructions for my participation in the procedure from the performing physician.  

## 2012-02-21 NOTE — Op Note (Signed)
[facility] [address] [place], [address]   COLONOSCOPY PROCEDURE REPORT PATIENT: Felicia Hunter, Felicia Hunter  MR#: [id number] BIRTHDATE: August 21, 1940 , 71  yrs. old GENDER: Female ENDOSCOPIST: Meryl Dare, MD, [facility] PROCEDURE DATE:  02/21/2012 PROCEDURE:   Colonoscopy with snare polypectomy ASA CLASS:   Class II INDICATIONS:Patient's personal history of adenomatous colon polyps.  MEDICATIONS: MAC sedation, administered by CRNA and propofol (Diprivan) 350mg  IV DESCRIPTION OF PROCEDURE:   After the risks benefits and alternatives of the procedure were thoroughly explained, informed consent was obtained.  A digital rectal exam revealed a palpable distal rectal lesion.   The LB [id number]  endoscope was introduced through the anus and advanced to the cecum, which was identified by both the appendix and ileocecal valve. No adverse events experienced with a tortuous colon.   The quality of the prep was good, using MoviPrep  The instrument was then slowly withdrawn as the colon was fully examined.  COLON FINDINGS: A sessile polyp measuring 6 mm in size was found in the sigmoid colon.  A polypectomy was performed with a cold snare. The resection was complete and the polyp tissue was completely retrieved. A sessile polyp measuring 10 mm in size was found in rectum seen upon the retroflexed view.  A piecemeal polypectomy was performed using snare cautery.  The resection was complete and the polyp tissue was completely retrieved.  Mild diverticulosis was noted in the sigmoid colon.   The colon was otherwise normal. There was no diverticulosis, inflammation, polyps or cancers unless previously stated.  Retroflexed views revealed no abnormalities. The time to cecum=4 minutes 28 seconds.  Withdrawal time=13 minutes 38 seconds.  The scope was withdrawn and the procedure completed. COMPLICATIONS: There were no complications.  ENDOSCOPIC IMPRESSION: 1.   Sessile polyp  measuring 6 mm in the sigmoid colon; polypectomy was performed with a cold snare 2.  Sessile polyp measuring 10 mm in rectum seen on the retroflexed view; piecemeal polypectomy performed using snare cautery 3.   Mild diverticulosis was noted in the sigmoid colon  RECOMMENDATIONS: 1.  Hold aspirin, aspirin products, and anti-inflammatory medication for 2 weeks 2.  Await pathology results 3.  Flexible sigmoidoscopy in 3-6 months pending path review to asseess for completeness of rectal polypectomy 4.  Repeat Colonoscopy in 3 years pending path review  eSigned:  Meryl Dare, MD, [facility] 02/21/2012 3:30 PM   cc: Romero Liner, MD

## 2012-02-21 NOTE — Progress Notes (Signed)
Patient did not have preoperative order for IV antibiotic SSI prophylaxis. (G8918)  Patient did not experience any of the following events: a burn prior to discharge; a fall within the facility; wrong site/side/patient/procedure/implant event; or a hospital transfer or hospital admission upon discharge from the facility. (G8907)  

## 2012-02-21 NOTE — Patient Instructions (Addendum)

## 2012-02-22 ENCOUNTER — Telehealth: Payer: Self-pay | Admitting: *Deleted

## 2012-02-22 NOTE — Telephone Encounter (Signed)
  Follow up Call-  Call back number 02/21/2012  Post procedure Call Back phone  # [phone]  Permission to leave phone message Yes     Patient questions:  Do you have a fever, pain , or abdominal swelling? no Pain Score  0 *  Have you tolerated food without any problems? yes  Have you been able to return to your normal activities? yes  Do you have any questions about your discharge instructions: Diet   no Medications  no Follow up visit  no  Do you have questions or concerns about your Care? no  Actions: * If pain score is 4 or above: No action needed, pain <4.

## 2012-02-24 ENCOUNTER — Telehealth: Payer: Self-pay | Admitting: Gastroenterology

## 2012-02-24 NOTE — Telephone Encounter (Signed)
Patient advised that path is not back yet, but I will call her with the results as soon as they are.  She was reassured that unlikely to be a cancer, but she will need follow up procedures in 3-6 months as the procedure reports states.  She thanked me for the call, and is aware I will call as soon as Dr. Russella Hunter has reviewed path.

## 2012-02-28 ENCOUNTER — Encounter: Payer: Self-pay | Admitting: Gastroenterology

## 2012-02-28 NOTE — Telephone Encounter (Signed)
Patient is trying to go to [place] for a few months and sign a lease.  She was advised that the polyps were not cancer as she feared and that Dr. Russella Dar will be sending her a letter in the mail about the follow up she will need for 3-6 months.  She thanked me for the call.

## 2012-04-29 ENCOUNTER — Other Ambulatory Visit: Payer: Self-pay

## 2012-06-08 ENCOUNTER — Encounter: Payer: Self-pay | Admitting: Gastroenterology

## 2012-06-14 ENCOUNTER — Encounter (HOSPITAL_BASED_OUTPATIENT_CLINIC_OR_DEPARTMENT_OTHER): Payer: Self-pay | Admitting: *Deleted

## 2012-06-14 NOTE — Progress Notes (Signed)
No need to labs-no labs ordered Has had multiple surgeries

## 2012-06-15 ENCOUNTER — Ambulatory Visit: Payer: Medicare Other | Admitting: Neurology

## 2012-06-15 DIAGNOSIS — D210 Benign neoplasm of connective and other soft tissue of head, face and neck: Secondary | ICD-10-CM | POA: Diagnosis not present

## 2012-06-15 DIAGNOSIS — I6381 Other cerebral infarction due to occlusion or stenosis of small artery: Secondary | ICD-10-CM | POA: Insufficient documentation

## 2012-06-15 DIAGNOSIS — E7800 Pure hypercholesterolemia, unspecified: Secondary | ICD-10-CM | POA: Insufficient documentation

## 2012-06-15 DIAGNOSIS — E785 Hyperlipidemia, unspecified: Secondary | ICD-10-CM | POA: Insufficient documentation

## 2012-06-19 ENCOUNTER — Ambulatory Visit (HOSPITAL_BASED_OUTPATIENT_CLINIC_OR_DEPARTMENT_OTHER)
Admission: RE | Admit: 2012-06-19 | Discharge: 2012-06-19 | Disposition: A | Discharge status: Home / Self Care | Payer: Medicare Other | Source: Ambulatory Visit | Attending: Specialist | Admitting: Specialist

## 2012-06-19 ENCOUNTER — Encounter (HOSPITAL_BASED_OUTPATIENT_CLINIC_OR_DEPARTMENT_OTHER)
Admission: RE | Disposition: A | Discharge status: Home / Self Care | Payer: Self-pay | Source: Ambulatory Visit | Attending: Specialist

## 2012-06-19 ENCOUNTER — Encounter (HOSPITAL_BASED_OUTPATIENT_CLINIC_OR_DEPARTMENT_OTHER): Payer: Self-pay | Admitting: Anesthesiology

## 2012-06-19 ENCOUNTER — Encounter (HOSPITAL_BASED_OUTPATIENT_CLINIC_OR_DEPARTMENT_OTHER): Payer: Self-pay

## 2012-06-19 ENCOUNTER — Ambulatory Visit (HOSPITAL_BASED_OUTPATIENT_CLINIC_OR_DEPARTMENT_OTHER): Payer: Medicare Other | Admitting: Anesthesiology

## 2012-06-19 DIAGNOSIS — Z8673 Personal history of transient ischemic attack (TIA), and cerebral infarction without residual deficits: Secondary | ICD-10-CM | POA: Diagnosis not present

## 2012-06-19 DIAGNOSIS — K219 Gastro-esophageal reflux disease without esophagitis: Secondary | ICD-10-CM | POA: Insufficient documentation

## 2012-06-19 DIAGNOSIS — D2110 Benign neoplasm of connective and other soft tissue of unspecified upper limb, including shoulder: Secondary | ICD-10-CM | POA: Diagnosis not present

## 2012-06-19 DIAGNOSIS — R221 Localized swelling, mass and lump, neck: Secondary | ICD-10-CM | POA: Insufficient documentation

## 2012-06-19 DIAGNOSIS — R220 Localized swelling, mass and lump, head: Secondary | ICD-10-CM | POA: Diagnosis not present

## 2012-06-19 DIAGNOSIS — M6740 Ganglion, unspecified site: Secondary | ICD-10-CM | POA: Insufficient documentation

## 2012-06-19 DIAGNOSIS — R12 Heartburn: Secondary | ICD-10-CM | POA: Insufficient documentation

## 2012-06-19 DIAGNOSIS — D210 Benign neoplasm of connective and other soft tissue of head, face and neck: Secondary | ICD-10-CM | POA: Diagnosis not present

## 2012-06-19 HISTORY — PX: MASS EXCISION: SHX2000

## 2012-06-19 SURGERY — EXCISION MASS
Anesthesia: General | Site: Shoulder | Laterality: Left | Wound class: Clean

## 2012-06-19 MED ORDER — CEFAZOLIN SODIUM-DEXTROSE 2-3 GM-% IV SOLR
2.0000 g | INTRAVENOUS | Status: AC
  Administered 2012-06-19: 2 g via INTRAVENOUS

## 2012-06-19 MED ORDER — EPHEDRINE SULFATE 50 MG/ML IJ SOLN
INTRAMUSCULAR | Status: DC | PRN
  Administered 2012-06-19 (×3): 10 mg via INTRAVENOUS

## 2012-06-19 MED ORDER — PROPOFOL 10 MG/ML IV BOLUS
INTRAVENOUS | Status: DC | PRN
  Administered 2012-06-19: 200 mg via INTRAVENOUS

## 2012-06-19 MED ORDER — OXYCODONE HCL 5 MG/5ML PO SOLN
5.0000 mg | Freq: Once | ORAL | Status: DC | PRN
Start: 2012-06-19 — End: 2012-06-19

## 2012-06-19 MED ORDER — LIDOCAINE HCL (CARDIAC) 20 MG/ML IV SOLN
INTRAVENOUS | Status: DC | PRN
  Administered 2012-06-19: 60 mg via INTRAVENOUS

## 2012-06-19 MED ORDER — SODIUM CHLORIDE 0.9 % IV SOLN
INTRAVENOUS | Status: DC | PRN
  Administered 2012-06-19: 10:00:00 via INTRAMUSCULAR

## 2012-06-19 MED ORDER — OXYCODONE HCL 5 MG PO TABS: 5.0000 mg | ORAL_TABLET | Freq: Once | ORAL | Status: DC | PRN

## 2012-06-19 MED ORDER — PHENYLEPHRINE HCL 10 MG/ML IJ SOLN
10.0000 mg | INTRAVENOUS | Status: DC | PRN
  Administered 2012-06-19: 50 ug/min via INTRAVENOUS

## 2012-06-19 MED ORDER — LACTATED RINGERS IV SOLN
INTRAVENOUS | Status: DC
  Administered 2012-06-19: 09:00:00 via INTRAVENOUS

## 2012-06-19 MED ORDER — ONDANSETRON HCL 4 MG/2ML IJ SOLN
INTRAMUSCULAR | Status: DC | PRN
  Administered 2012-06-19: 4 mg via INTRAVENOUS

## 2012-06-19 MED ORDER — FENTANYL CITRATE 0.05 MG/ML IJ SOLN
INTRAMUSCULAR | Status: DC | PRN
  Administered 2012-06-19: 75 ug via INTRAVENOUS

## 2012-06-19 MED ORDER — FENTANYL CITRATE 0.05 MG/ML IJ SOLN: 50.0000 ug | INTRAMUSCULAR | Status: DC | PRN

## 2012-06-19 MED ORDER — DEXAMETHASONE SODIUM PHOSPHATE 4 MG/ML IJ SOLN
INTRAMUSCULAR | Status: DC | PRN
  Administered 2012-06-19: 5 mg via INTRAVENOUS

## 2012-06-19 MED ORDER — MIDAZOLAM HCL 2 MG/2ML IJ SOLN: 1.0000 mg | INTRAMUSCULAR | Status: DC | PRN

## 2012-06-19 MED ORDER — HYDROMORPHONE HCL PF 1 MG/ML IJ SOLN: 0.2500 mg | INTRAMUSCULAR | Status: DC | PRN

## 2012-06-19 SURGICAL SUPPLY — 66 items
APL SKNCLS STERI-STRIP NONHPOA (GAUZE/BANDAGES/DRESSINGS)
BAG DECANTER FOR FLEXI CONT (MISCELLANEOUS) ×2 IMPLANT
BALL CTTN LRG ABS STRL LF (GAUZE/BANDAGES/DRESSINGS)
BANDAGE ELASTIC 4 VELCRO ST LF (GAUZE/BANDAGES/DRESSINGS) IMPLANT
BANDAGE GAUZE ELAST BULKY 4 IN (GAUZE/BANDAGES/DRESSINGS) IMPLANT
BENZOIN TINCTURE PRP APPL 2/3 (GAUZE/BANDAGES/DRESSINGS) IMPLANT
BLADE KNIFE PERSONA 10 (BLADE) ×2 IMPLANT
BLADE KNIFE PERSONA 15 (BLADE) ×2 IMPLANT
BNDG COHESIVE 4X5 TAN STRL (GAUZE/BANDAGES/DRESSINGS) IMPLANT
CANISTER SUCTION 1200CC (MISCELLANEOUS) ×2 IMPLANT
CLEANER CAUTERY TIP 5X5 PAD (MISCELLANEOUS) ×2 IMPLANT
CLOTH BEACON ORANGE TIMEOUT ST (SAFETY) ×2 IMPLANT
COTTONBALL LRG STERILE PKG (GAUZE/BANDAGES/DRESSINGS) IMPLANT
COVER MAYO STAND STRL (DRAPES) ×2 IMPLANT
COVER TABLE BACK 60X90 (DRAPES) ×2 IMPLANT
DRAIN CHANNEL 7F 3/4 FLAT (WOUND CARE) ×1 IMPLANT
DRAPE LAPAROSCOPIC ABDOMINAL (DRAPES) IMPLANT
DRAPE U-SHAPE 76X120 STRL (DRAPES) ×1 IMPLANT
DRSG PAD ABDOMINAL 8X10 ST (GAUZE/BANDAGES/DRESSINGS) ×1 IMPLANT
ELECT NDL TIP 2.8 STRL (NEEDLE) ×1 IMPLANT
ELECT NEEDLE TIP 2.8 STRL (NEEDLE) IMPLANT
ELECT REM PT RETURN 9FT ADLT (ELECTROSURGICAL) ×2
ELECTRODE REM PT RTRN 9FT ADLT (ELECTROSURGICAL) ×1 IMPLANT
EVACUATOR SILICONE 100CC (DRAIN) ×2 IMPLANT
FILTER 7/8 IN (FILTER) IMPLANT
GAUZE SPONGE 4X4 12PLY STRL LF (GAUZE/BANDAGES/DRESSINGS) IMPLANT
GAUZE SPONGE 4X4 16PLY XRAY LF (GAUZE/BANDAGES/DRESSINGS) IMPLANT
GAUZE XEROFORM 1X8 LF (GAUZE/BANDAGES/DRESSINGS) ×2 IMPLANT
GAUZE XEROFORM 5X9 LF (GAUZE/BANDAGES/DRESSINGS) IMPLANT
GLOVE BIO SURGEON STRL SZ 6.5 (GLOVE) ×2 IMPLANT
GLOVE BIOGEL M STRL SZ7.5 (GLOVE) ×2 IMPLANT
GLOVE BIOGEL PI IND STRL 8 (GLOVE) ×1 IMPLANT
GLOVE BIOGEL PI INDICATOR 8 (GLOVE) ×1
GLOVE ECLIPSE 7.0 STRL STRAW (GLOVE) ×2 IMPLANT
GOWN PREVENTION PLUS XLARGE (GOWN DISPOSABLE) IMPLANT
GOWN PREVENTION PLUS XXLARGE (GOWN DISPOSABLE) ×3 IMPLANT
NDL HYPO 25X1 1.5 SAFETY (NEEDLE) ×1 IMPLANT
NEEDLE HYPO 25X1 1.5 SAFETY (NEEDLE) ×2 IMPLANT
PACK BASIN DAY SURGERY FS (CUSTOM PROCEDURE TRAY) ×2 IMPLANT
PAD CLEANER CAUTERY TIP 5X5 (MISCELLANEOUS) ×2
PENCIL BUTTON HOLSTER BLD 10FT (ELECTRODE) ×2 IMPLANT
SHEET MEDIUM DRAPE 40X70 STRL (DRAPES) ×4 IMPLANT
SHEETING SILICONE GEL EPI DERM (MISCELLANEOUS) IMPLANT
SLEEVE SCD COMPRESS KNEE MED (MISCELLANEOUS) ×2 IMPLANT
SPONGE GAUZE 4X4 12PLY (GAUZE/BANDAGES/DRESSINGS) IMPLANT
SPONGE LAP 18X18 X RAY DECT (DISPOSABLE) ×3 IMPLANT
STAPLER VISISTAT 35W (STAPLE) IMPLANT
STOCKINETTE 4X48 STRL (DRAPES) IMPLANT
STRIP CLOSURE SKIN 1/2X4 (GAUZE/BANDAGES/DRESSINGS) IMPLANT
STRIP SUTURE WOUND CLOSURE 1/2 (SUTURE) ×1 IMPLANT
SUCTION FRAZIER TIP 10 FR DISP (SUCTIONS) ×1 IMPLANT
SUT MNCRL AB 3-0 PS2 18 (SUTURE) IMPLANT
SUT PROLENE 3 0 PS 2 (SUTURE) ×1 IMPLANT
SUT PROLENE 4 0 P 3 18 (SUTURE) IMPLANT
SUT PROLENE 4 0 PS 2 18 (SUTURE) IMPLANT
SUT SILK 3 0 PS 1 (SUTURE) IMPLANT
SYR 50ML LL SCALE MARK (SYRINGE) ×2 IMPLANT
SYR BULB 3OZ (MISCELLANEOUS) ×1 IMPLANT
SYR CONTROL 10ML LL (SYRINGE) ×2 IMPLANT
TAPE HYPAFIX 6X30 (GAUZE/BANDAGES/DRESSINGS) IMPLANT
TOWEL OR 17X24 6PK STRL BLUE (TOWEL DISPOSABLE) ×4 IMPLANT
TRAY DSU PREP LF (CUSTOM PROCEDURE TRAY) ×2 IMPLANT
TUBE CONNECTING 20X1/4 (TUBING) ×1 IMPLANT
UNDERPAD 30X30 INCONTINENT (UNDERPADS AND DIAPERS) IMPLANT
VAC PENCILS W/TUBING CLEAR (MISCELLANEOUS) ×2 IMPLANT
YANKAUER SUCT BULB TIP NO VENT (SUCTIONS) ×2 IMPLANT

## 2012-06-19 NOTE — Transfer of Care (Signed)
Immediate Anesthesia Transfer of Care Note  Patient: Felicia Hunter  Procedure(s) Performed: Procedure(s): EXCISION OF LARGE MASS LEFT NECK/SHOULDER WITH PLASTIC CLOSURE (Left)  Patient Location: PACU  Anesthesia Type:General  Level of Consciousness: awake, alert  and oriented  Airway & Oxygen Therapy: Patient Spontanous Breathing and Patient connected to face mask oxygen  Post-op Assessment: Report given to PACU RN and Post -op Vital signs reviewed and stable  Post vital signs: Reviewed and stable  Complications: No apparent anesthesia complications

## 2012-06-19 NOTE — Anesthesia Postprocedure Evaluation (Signed)
  Anesthesia Post-op Note  Patient: Felicia Hunter  Procedure(s) Performed: Procedure(s): EXCISION OF LARGE MASS LEFT NECK/SHOULDER WITH PLASTIC CLOSURE (Left)  Patient Location: PACU  Anesthesia Type:General  Level of Consciousness: awake, alert  and oriented  Airway and Oxygen Therapy: Patient Spontanous Breathing  Post-op Pain: none  Post-op Assessment: Post-op Vital signs reviewed, Patient's Cardiovascular Status Stable, Respiratory Function Stable, Patent Airway and No signs of Nausea or vomiting  Post-op Vital Signs: Reviewed and stable  Complications: No apparent anesthesia complications

## 2012-06-19 NOTE — Brief Op Note (Signed)
06/19/2012  10:20 AM  PATIENT:  Felicia Hunter  72 y.o. female  PRE-OPERATIVE DIAGNOSIS:  EXCISION OF LARGE MASS  POST-OPERATIVE DIAGNOSIS:  EXCISION OF LARGE MASS  PROCEDURE:  Procedure(s): EXCISION OF LARGE MASS LEFT NECK/SHOULDER WITH PLASTIC CLOSURE (Left)  SURGEON:  Surgeon(s) and Role:    * Louisa Second, MD - Primary  PHYSICIAN ASSISTANT:   ASSISTANTS: none   ANESTHESIA:   general  EBL:  Total I/O In: 1300 [I.V.:1300] Out: -   BLOOD ADMINISTERED:none  DRAINS: (left lateral neck incision) Jackson-Pratt drain(s) with closed bulb suction in the left lateral neck incision area   LOCAL MEDICATIONS USED:  LIDOCAINE   SPECIMEN:  Excision  DISPOSITION OF SPECIMEN:  PATHOLOGY  COUNTS:  YES  TOURNIQUET:  * No tourniquets in log *  DICTATION: .Other Dictation: Dictation Number [id number]  PLAN OF CARE: Discharge to home after PACU  PATIENT DISPOSITION:  PACU - hemodynamically stable.   Delay start of Pharmacological VTE agent (>24hrs) due to surgical blood loss or risk of bleeding: yes

## 2012-06-19 NOTE — H&P (Signed)
Felicia Hunter is an 72 y.o. female.   Chief Complaint: Large mass left neck area HPI: Increasedgrowth and discomfort with limitation of motion  Past Medical History  Diagnosis Date  . GERD (gastroesophageal reflux disease)   . Breast cancer   . Arthritis   . Cough   . Stroke 06/07/11  . Tubulovillous adenoma of colon 08/2003  . Diverticulosis   . Hyperlipidemia   . Osteopenia   . Alopecia   . MVP (mitral valve prolapse)   . Clavicle fracture   . Disorders of bilirubin excretion     Past Surgical History  Procedure Laterality Date  . Collarbone  september 2011    pinned and plated, right  . Abdominal hysterectomy  1993  . Vein surgery  1974    left leg  . Nasal septum surgery    . Tonsillectomy    . Joint replacement  july 9th, 2012    left knee replacement   . Knee reconstruction  7/12    revision lt total knee  . Breast enhancement surgery      x3-then removed 2011 for bilat mastectomies  . Chest wall tumor excision  5/12    noduals removed-neg  . Breast capsulotomy      after bilat mastectomies-implants removed  . Breast surgery  june 2011    double mastectomy nodes from rt    Family History  Problem Relation Age of Onset  . Aneurysm Father    Social History:  reports that she has never smoked. She has never used smokeless tobacco. She reports that she drinks about 0.6 ounces of alcohol per week. She reports that she does not use illicit drugs.  Allergies:  Allergies  Allergen Reactions  . Sulfonamide Derivatives Rash    Medications Prior to Admission  Medication Sig Dispense Refill  . aspirin 325 MG tablet Take 325 mg by mouth daily.      Marland Kitchen atorvastatin (LIPITOR) 20 MG tablet 20 mg daily.       Marland Kitchen BIOTIN PO Take 1 tablet by mouth daily.      . calcium carbonate (CALCIUM 600) 600 MG TABS Take 600 mg by mouth daily.      . cholecalciferol (VITAMIN D) 400 UNITS TABS Take 1,000 Units by mouth daily.      . diphenhydramine-acetaminophen (TYLENOL PM) 25-500  MG TABS Take 1 tablet by mouth at bedtime as needed.      Marland Kitchen glucosamine-chondroitin 500-400 MG tablet Take 1 tablet by mouth 2 (two) times daily.      . Multiple Vitamin (MULITIVITAMIN WITH MINERALS) TABS Take 1 tablet by mouth daily.      Marland Kitchen omega-3 acid ethyl esters (LOVAZA) 1 G capsule Take 2 g by mouth 2 (two) times daily.      Marland Kitchen zolpidem (AMBIEN) 10 MG tablet Take 10 mg by mouth at bedtime as needed. sleep        No results found for this or any previous visit (from the past 48 hour(s)). No results found.  Review of Systems  Constitutional: Negative.   HENT: Negative.   Eyes: Negative.   Respiratory: Negative.   Cardiovascular: Negative.   Gastrointestinal: Positive for heartburn.  Genitourinary: Negative.   Musculoskeletal: Negative.   Skin: Negative.   Neurological: Negative.   Endo/Heme/Allergies: Negative.   Psychiatric/Behavioral: Negative.     Blood pressure 137/83, pulse 68, temperature 97.3 F (36.3 C), temperature source Oral, resp. rate 20, height 5\' 5"  (1.651 m), weight 61.916 kg (136 lb  8 oz), SpO2 97.00%. Physical Exam   Assessment/Plan Mass left neck with increased growth  For excision and plastic closure  [name] L 06/19/2012, 8:29 AM

## 2012-06-19 NOTE — Anesthesia Procedure Notes (Signed)
Procedure Name: LMA Insertion Performed by: Samina Weekes W Pre-anesthesia Checklist: Patient identified, Timeout performed, Emergency Drugs available, Suction available and Patient being monitored Patient Re-evaluated:Patient Re-evaluated prior to inductionOxygen Delivery Method: Circle system utilized Preoxygenation: Pre-oxygenation with 100% oxygen Intubation Type: IV induction Ventilation: Mask ventilation without difficulty LMA: LMA inserted LMA Size: 4.0 Number of attempts: 1 Placement Confirmation: breath sounds checked- equal and bilateral and positive ETCO2 Tube secured with: Tape Dental Injury: Teeth and Oropharynx as per pre-operative assessment      

## 2012-06-19 NOTE — Anesthesia Preprocedure Evaluation (Signed)
Anesthesia Evaluation  Patient identified by MRN, date of birth, ID band Patient awake    Reviewed: Allergy & Precautions, H&P , NPO status , Patient's Chart, lab work & pertinent test results  Airway Mallampati: II TM Distance: >3 FB Neck ROM: Full    Dental no notable dental hx. (+) Teeth Intact and Dental Advisory Given   Pulmonary asthma ,  breath sounds clear to auscultation  Pulmonary exam normal       Cardiovascular negative cardio ROS  Rhythm:Regular Rate:Normal     Neuro/Psych PSYCHIATRIC DISORDERS CVA, No Residual Symptoms    GI/Hepatic Neg liver ROS, GERD-  Medicated and Controlled,  Endo/Other  negative endocrine ROS  Renal/GU negative Renal ROS  negative genitourinary   Musculoskeletal   Abdominal   Peds  Hematology negative hematology ROS (+)   Anesthesia Other Findings   Reproductive/Obstetrics negative OB ROS                           Anesthesia Physical Anesthesia Plan  ASA: II  Anesthesia Plan: General   Post-op Pain Management:    Induction: Intravenous  Airway Management Planned: LMA  Additional Equipment:   Intra-op Plan:   Post-operative Plan: Extubation in OR  Informed Consent: I have reviewed the patients History and Physical, chart, labs and discussed the procedure including the risks, benefits and alternatives for the proposed anesthesia with the patient or authorized representative who has indicated his/her understanding and acceptance.   Dental advisory given  Plan Discussed with: CRNA  Anesthesia Plan Comments:         Anesthesia Quick Evaluation

## 2012-06-20 ENCOUNTER — Encounter (HOSPITAL_BASED_OUTPATIENT_CLINIC_OR_DEPARTMENT_OTHER): Payer: Self-pay | Admitting: Specialist

## 2012-06-20 NOTE — Op Note (Deleted)
NAME:  Felicia Hunter, Felicia Hunter              ACCOUNT NO.:  [id number]  MEDICAL RECORD NO.:  [id number]  LOCATION:  XRAY                         FACILITY:  [facility]  PHYSICIAN:  Leif Loflin L. Somya Jauregui, M.D.DATE OF BIRTH:  09/29/1940  DATE OF PROCEDURE:  06/19/2012 DATE OF DISCHARGE:  06/19/2012                              OPERATIVE REPORT   INDICATION:  A 72-year-old lady with enlarging mass involving her left neck clavicle area, increased pain, discomfort, limiting on range of motion especially turned to the left.  The patient was seen by Orthopedic Surgery, who initially thought it was related to a bony structure.  MRI showing it to be a large ganglion cyst.  PROCEDURES DONE:  Excision of large ganglion cyst, antrum, muscular of the left neck clavicle areas, flap, reconstruction and closure.  ANESTHESIA:  General.  DESCRIPTION OF PROCEDURE:  The patient underwent general anesthesia, intubated orally.  Preoperatively, the area was drawn with a marking pen.  Prep was done to the left neck, chest, shoulder areas, and face with Hibiclens soap and solution walled off with sterile towels and drapes so as to make a sterile field.  The areas were injected with 1/3% of Xylocaine with epinephrine, a total of 100 mL.  Curvilinear incision was made across the mass horizontally and down to the underlying skin and subcutaneous tissue, with #15 blade.  Next, dissection was carried down gently with Stevens scissors to the mass.  Using Stevens scissors blunt, I was able to dissect across the top part of the mass.  We went medially first, so I can demarcate the edge of that area, and we could see a definitely deep area where it penetrated on into the joint region. I was able to clamp that off out laterally.  The mass continued on approximately 3 cm more than that showed on MRI to have like a neck area that dissected all the way across.  I had to extend my incision to be able to dissect across this to see where  it entered into the joint space that was clamped as well and tied off with 3-0 silk ties.  The dissection was then carried out whereby after now a lift the lateral edge of the mass and controlled it with a vascular band to better dissect under the mass to make sure we did not run into any on aberrant vessels from the subclavian artery.  All that was clean as we dissected across meticulously with the Stevens scissors.  Small tributaries were all coagulated with the Bovie on coagulation, and I was able to next enter into the muscle and dissect around the muscle and dissected all the edges of the muscle medially and laterally to allow me to remove this very large ganglion cyst.  After this, irrigation was done with copious amounts of irrigation, hemostasis was controlled with the Bovie and anticoagulation to get excellent hemostasis, and then we were able to reclose the muscle with 3-0 Monocryl, subcutaneous tissue with 3-0 Monocryl.  The flaps were rotated back in and then the skin edges were approximated with a running subcuticular stitch of 3-0 Monocryl.  The wounds were drained with a #7 fully fluted Blake drain which was   placed in the depths of wound and brought out through the lateral-most portion of the incision secured with 3-0 Prolene.  Steri-Strips and soft bulky dressing applied to all the areas.  She withstood the procedures very well, was taken to recovery in excellent condition.  ESTIMATED BLOOD LOSS:  Less than 75 mL.  COMPLICATIONS:  None.     Daja Shuping L. Dallys Nowakowski, M.D.     GLT/MEDQ  D:  06/19/2012  T:  06/20/2012  Job:  [id number] 

## 2012-06-20 NOTE — Op Note (Deleted)
NAME:  Felicia Hunter, Felicia Hunter              ACCOUNT NO.:  [id number]  MEDICAL RECORD NO.:  [id number]  LOCATION:  XRAY                         FACILITY:  [facility]  PHYSICIAN:  Earvin Hansen L. Isidoro Santillana, M.D.DATE OF BIRTH:  Sep 15, 1940  DATE OF PROCEDURE:  06/19/2012 DATE OF DISCHARGE:  06/19/2012                              OPERATIVE REPORT   INDICATION:  A 72 year old lady with enlarging mass involving her left neck clavicle area, increased pain, discomfort, limiting on range of motion especially turned to the left.  The patient was seen by Orthopedic Surgery, who initially thought it was related to a bony structure.  MRI showing it to be a large ganglion cyst.  PROCEDURES DONE:  Excision of large ganglion cyst, antrum, muscular of the left neck clavicle areas, flap, reconstruction and closure.  ANESTHESIA:  General.  DESCRIPTION OF PROCEDURE:  The patient underwent general anesthesia, intubated orally.  Preoperatively, the area was drawn with a marking pen.  Prep was done to the left neck, chest, shoulder areas, and face with Hibiclens soap and solution walled off with sterile towels and drapes so as to make a sterile field.  The areas were injected with 1/3% of Xylocaine with epinephrine, a total of 100 mL.  Curvilinear incision was made across the mass horizontally and down to the underlying skin and subcutaneous tissue, with #15 blade.  Next, dissection was carried down gently with Stevens scissors to the mass.  Using Stevens scissors blunt, I was able to dissect across the top part of the mass.  We went medially first, so I can demarcate the edge of that area, and we could see a definitely deep area where it penetrated on into the joint region. I was able to clamp that off out laterally.  The mass continued on approximately 3 cm more than that showed on MRI to have like a neck area that dissected all the way across.  I had to extend my incision to be able to dissect across this to see where  it entered into the joint space that was clamped as well and tied off with 3-0 silk ties.  The dissection was then carried out whereby after now a lift the lateral edge of the mass and controlled it with a vascular band to better dissect under the mass to make sure we did not run into any on aberrant vessels from the subclavian artery.  All that was clean as we dissected across meticulously with the [employer].  Small tributaries were all coagulated with the Bovie on coagulation, and I was able to next enter into the muscle and dissect around the muscle and dissected all the edges of the muscle medially and laterally to allow me to remove this very large ganglion cyst.  After this, irrigation was done with copious amounts of irrigation, hemostasis was controlled with the Bovie and anticoagulation to get excellent hemostasis, and then we were able to reclose the muscle with 3-0 Monocryl, subcutaneous tissue with 3-0 Monocryl.  The flaps were rotated back in and then the skin edges were approximated with a running subcuticular stitch of 3-0 Monocryl.  The wounds were drained with a #7 fully fluted Blake drain which was  placed in the depths of wound and brought out through the lateral-most portion of the incision secured with 3-0 Prolene.  Steri-Strips and soft bulky dressing applied to all the areas.  She withstood the procedures very well, was taken to recovery in excellent condition.  ESTIMATED BLOOD LOSS:  Less than 75 mL.  COMPLICATIONS:  None.     Yaakov Guthrie. Shon Hough, M.D.     Cathie Hoops  D:  06/19/2012  T:  06/20/2012  Job:  [id number]

## 2012-06-20 NOTE — Op Note (Signed)
NAME:  Fenstermacher, Gracelynne S              ACCOUNT NO.:  [id number]  MEDICAL RECORD NO.:  [id number]  LOCATION:  XRAY                         FACILITY:  [facility]  PHYSICIAN:  Shylo Dillenbeck L. Canna Nickelson, M.D.DATE OF BIRTH:  07/23/1940  DATE OF PROCEDURE:  06/19/2012 DATE OF DISCHARGE:  06/19/2012                              OPERATIVE REPORT   INDICATION:  A 72-year-old lady with enlarging mass involving her left neck clavicle area, increased pain, discomfort, limiting on range of motion especially turned to the left.  The patient was seen by Orthopedic Surgery, who initially thought it was related to a bony structure.  MRI showing it to be a large ganglion cyst.  PROCEDURES DONE:  Excision of large ganglion cyst, antrum, muscular of the left neck clavicle areas, flap, reconstruction and closure.  ANESTHESIA:  General.  DESCRIPTION OF PROCEDURE:  The patient underwent general anesthesia, intubated orally.  Preoperatively, the area was drawn with a marking pen.  Prep was done to the left neck, chest, shoulder areas, and face with Hibiclens soap and solution walled off with sterile towels and drapes so as to make a sterile field.  The areas were injected with 1/3% of Xylocaine with epinephrine, a total of 100 mL.  Curvilinear incision was made across the mass horizontally and down to the underlying skin and subcutaneous tissue, with #15 blade.  Next, dissection was carried down gently with Stevens scissors to the mass.  Using Stevens scissors blunt, I was able to dissect across the top part of the mass.  We went medially first, so I can demarcate the edge of that area, and we could see a definitely deep area where it penetrated on into the joint region. I was able to clamp that off out laterally.  The mass continued on approximately 3 cm more than that showed on MRI to have like a neck area that dissected all the way across.  I had to extend my incision to be able to dissect across this to see where  it entered into the joint space that was clamped as well and tied off with 3-0 silk ties.  The dissection was then carried out whereby after now a lift the lateral edge of the mass and controlled it with a vascular band to better dissect under the mass to make sure we did not run into any on aberrant vessels from the subclavian artery.  All that was clean as we dissected across meticulously with the Stevens scissors.  Small tributaries were all coagulated with the Bovie on coagulation, and I was able to next enter into the muscle and dissect around the muscle and dissected all the edges of the muscle medially and laterally to allow me to remove this very large ganglion cyst.  After this, irrigation was done with copious amounts of irrigation, hemostasis was controlled with the Bovie and anticoagulation to get excellent hemostasis, and then we were able to reclose the muscle with 3-0 Monocryl, subcutaneous tissue with 3-0 Monocryl.  The flaps were rotated back in and then the skin edges were approximated with a running subcuticular stitch of 3-0 Monocryl.  The wounds were drained with a #7 fully fluted Blake drain which was   placed in the depths of wound and brought out through the lateral-most portion of the incision secured with 3-0 Prolene.  Steri-Strips and soft bulky dressing applied to all the areas.  She withstood the procedures very well, was taken to recovery in excellent condition.  ESTIMATED BLOOD LOSS:  Less than 75 mL.  COMPLICATIONS:  None.     Elsy Chiang L. Liley Rake, M.D.     GLT/MEDQ  D:  06/19/2012  T:  06/20/2012  Job:  [id number] 

## 2012-07-03 ENCOUNTER — Encounter (INDEPENDENT_AMBULATORY_CARE_PROVIDER_SITE_OTHER): Payer: Self-pay | Admitting: General Surgery

## 2012-07-03 ENCOUNTER — Ambulatory Visit (INDEPENDENT_AMBULATORY_CARE_PROVIDER_SITE_OTHER): Payer: Medicare Other | Admitting: General Surgery

## 2012-07-03 VITALS — BP 130/86 | HR 71 | Temp 97.5°F | Ht 65.5 in | Wt 141.4 lb

## 2012-07-03 DIAGNOSIS — Z853 Personal history of malignant neoplasm of breast: Secondary | ICD-10-CM | POA: Diagnosis not present

## 2012-07-03 NOTE — Progress Notes (Signed)
Operation:  Bilateral mastectomies for DCIS  Date:  10/09/2009  Stage: Tis  Hormone receptor status: positive  HPI:  Felicia Hunter is here for another long-term followup of her right breast DCIS. She denies any chest wall nodules. She denies any axillary or cervical adenopathy. She had a ganglion cyst taken removed from her left shoulder area recently. PE:  Felicia Hunter looks well and in no acute distress.  Chest-bilateral chest wall scars are present; no nodules are palpable. No suspicious skin changes are noted.  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

## 2012-07-03 NOTE — Patient Instructions (Signed)
Call if you find any new nodules on your chest wall. 

## 2012-07-04 DIAGNOSIS — Z79899 Other long term (current) drug therapy: Secondary | ICD-10-CM | POA: Diagnosis not present

## 2012-07-04 DIAGNOSIS — E7800 Pure hypercholesterolemia, unspecified: Secondary | ICD-10-CM | POA: Diagnosis not present

## 2012-07-13 DIAGNOSIS — H35369 Drusen (degenerative) of macula, unspecified eye: Secondary | ICD-10-CM | POA: Diagnosis not present

## 2012-07-13 DIAGNOSIS — H04129 Dry eye syndrome of unspecified lacrimal gland: Secondary | ICD-10-CM | POA: Diagnosis not present

## 2012-07-13 DIAGNOSIS — H259 Unspecified age-related cataract: Secondary | ICD-10-CM | POA: Diagnosis not present

## 2012-07-13 DIAGNOSIS — H43819 Vitreous degeneration, unspecified eye: Secondary | ICD-10-CM | POA: Diagnosis not present

## 2012-09-20 DIAGNOSIS — D210 Benign neoplasm of connective and other soft tissue of head, face and neck: Secondary | ICD-10-CM | POA: Diagnosis not present

## 2012-10-18 ENCOUNTER — Other Ambulatory Visit: Payer: Self-pay

## 2012-10-26 ENCOUNTER — Ambulatory Visit (HOSPITAL_BASED_OUTPATIENT_CLINIC_OR_DEPARTMENT_OTHER): Payer: Medicare Other | Admitting: Hematology & Oncology

## 2012-10-26 ENCOUNTER — Other Ambulatory Visit (HOSPITAL_BASED_OUTPATIENT_CLINIC_OR_DEPARTMENT_OTHER): Payer: Medicare Other | Admitting: Lab

## 2012-10-26 VITALS — BP 124/76 | HR 67 | Temp 98.1°F | Resp 16

## 2012-10-26 DIAGNOSIS — D0510 Intraductal carcinoma in situ of unspecified breast: Secondary | ICD-10-CM

## 2012-10-26 DIAGNOSIS — M810 Age-related osteoporosis without current pathological fracture: Secondary | ICD-10-CM | POA: Diagnosis not present

## 2012-10-26 DIAGNOSIS — D689 Coagulation defect, unspecified: Secondary | ICD-10-CM | POA: Diagnosis not present

## 2012-10-26 DIAGNOSIS — D0590 Unspecified type of carcinoma in situ of unspecified breast: Secondary | ICD-10-CM

## 2012-10-26 DIAGNOSIS — Z853 Personal history of malignant neoplasm of breast: Secondary | ICD-10-CM

## 2012-10-26 LAB — CBC WITH DIFFERENTIAL (CANCER CENTER ONLY)
BASO%: 0.8 % (ref 0.0–2.0)
LYMPH%: 24 % (ref 14.0–48.0)
MCV: 97 fL (ref 81–101)
MONO#: 0.5 10*3/uL (ref 0.1–0.9)
MONO%: 8.7 % (ref 0.0–13.0)
NEUT#: 4 10*3/uL (ref 1.5–6.5)
Platelets: 228 10*3/uL (ref 145–400)
RDW: 13.1 % (ref 11.1–15.7)
WBC: 6.2 10*3/uL (ref 3.9–10.0)

## 2012-10-26 NOTE — Progress Notes (Signed)
This office note has been dictated.

## 2012-10-27 ENCOUNTER — Encounter: Payer: Self-pay | Admitting: *Deleted

## 2012-10-27 LAB — COMPREHENSIVE METABOLIC PANEL
ALT: 17 U/L (ref 0–35)
Albumin: 4.4 g/dL (ref 3.5–5.2)
Alkaline Phosphatase: 58 U/L (ref 39–117)
CO2: 27 mEq/L (ref 19–32)
Potassium: 4.3 mEq/L (ref 3.5–5.3)
Sodium: 140 mEq/L (ref 135–145)
Total Bilirubin: 1.1 mg/dL (ref 0.3–1.2)
Total Protein: 6.7 g/dL (ref 6.0–8.3)

## 2012-10-27 NOTE — Progress Notes (Signed)
CC:   Felicia Hunter. Felicia Hunter, M.D.  DIAGNOSIS:  Ductal carcinoma in situ of the right breast.  CURRENT THERAPY:  Observation.  INTERIM HISTORY:  Felicia Hunter comes in for her followup.  We see her yearly.  She is doing okay since we last saw her a year ago.  She has had no new problems pop up.  She has really had no change in her medications from what she tells [place].  She has not been hospitalized.  There have been no issues with respect to a CVA.  She did have a ganglion cyst removed from the left neck/shoulder.  This was found to be benign.  Otherwise, she is enjoying herself.  She went to the beach with her family.  They had a good time.  She does have vitiligo.  She watches herself in the sun.  She is bothered by her joint aches and pains.  These she lives with.  PHYSICAL EXAMINATION:  General:  This is a petite white female in no obvious distress.  Vital signs:  Temperature of 98.1, pulse 67, respiratory rate 16, blood pressure 124/76.  Weight is 140.  Head and neck:  Normocephalic, atraumatic skull.  There are no ocular or oral lesions.  There are no palpable cervical or supraclavicular lymph nodes. Lungs:  Clear bilaterally.  Cardiac:  Regular rate and rhythm with a normal S1 and S2.  There are no murmurs, rubs or bruits.  Breasts:  Show bilateral mastectomies.  She has no chest wall nodules, erythema or warmth.  There is no bilateral axillary adenopathy.  Abdomen:  Soft. She has good bowel sounds.  There is no fluid wave.  There is no palpable hepatosplenomegaly.  Extremities:  Osteoarthritic changes in her joints.  Skin:  Does show some areas of vitiligo.  Neurological.  No focal neurological deficits.  LABORATORY STUDIES:  White cell count is 6.2, hemoglobin 13.6, hematocrit 41.6, platelet count 228.  IMPRESSION:  Felicia Hunter is a very charming 72 year old white female with history of ductal carcinoma in situ of the right breast.  She was diagnosed in June 2011.  She is  doing quite well.  She is not on any type of antiestrogen for this.  We will continue to follow her along.  We will see her back yearly.    ______________________________ Josph Macho, M.D. PRE/MEDQ  D:  10/26/2012  T:  10/27/2012  Job:  [id number]

## 2012-11-02 ENCOUNTER — Telehealth: Payer: Self-pay | Admitting: *Deleted

## 2012-11-02 NOTE — Telephone Encounter (Addendum)
Message copied by Mirian Capuchin on Thu Nov 02, 2012  3:19 PM ------      Message from: Arlan Organ R      Created: Mon Oct 30, 2012  7:42 AM       Please call and let her know that her labs are okay. Thanks. Pete ------This message given to patient.  Voiced understanding.

## 2012-11-14 ENCOUNTER — Other Ambulatory Visit (INDEPENDENT_AMBULATORY_CARE_PROVIDER_SITE_OTHER): Payer: Self-pay | Admitting: *Deleted

## 2012-11-14 MED ORDER — UNABLE TO FIND: Status: DC

## 2012-12-18 DIAGNOSIS — Z0000 Encounter for general adult medical examination without abnormal findings: Secondary | ICD-10-CM | POA: Diagnosis not present

## 2012-12-18 DIAGNOSIS — Z23 Encounter for immunization: Secondary | ICD-10-CM | POA: Diagnosis not present

## 2012-12-18 DIAGNOSIS — E7800 Pure hypercholesterolemia, unspecified: Secondary | ICD-10-CM | POA: Diagnosis not present

## 2012-12-18 DIAGNOSIS — K219 Gastro-esophageal reflux disease without esophagitis: Secondary | ICD-10-CM | POA: Diagnosis not present

## 2012-12-18 DIAGNOSIS — I10 Essential (primary) hypertension: Secondary | ICD-10-CM | POA: Diagnosis not present

## 2012-12-18 DIAGNOSIS — I059 Rheumatic mitral valve disease, unspecified: Secondary | ICD-10-CM | POA: Diagnosis not present

## 2012-12-18 DIAGNOSIS — Z006 Encounter for examination for normal comparison and control in clinical research program: Secondary | ICD-10-CM | POA: Diagnosis not present

## 2012-12-18 DIAGNOSIS — Z79899 Other long term (current) drug therapy: Secondary | ICD-10-CM | POA: Diagnosis not present

## 2012-12-21 DIAGNOSIS — E7800 Pure hypercholesterolemia, unspecified: Secondary | ICD-10-CM | POA: Diagnosis not present

## 2012-12-21 DIAGNOSIS — M159 Polyosteoarthritis, unspecified: Secondary | ICD-10-CM | POA: Diagnosis not present

## 2012-12-21 DIAGNOSIS — R61 Generalized hyperhidrosis: Secondary | ICD-10-CM | POA: Diagnosis not present

## 2012-12-21 DIAGNOSIS — R05 Cough: Secondary | ICD-10-CM | POA: Diagnosis not present

## 2012-12-21 DIAGNOSIS — M899 Disorder of bone, unspecified: Secondary | ICD-10-CM | POA: Diagnosis not present

## 2012-12-21 DIAGNOSIS — Z1212 Encounter for screening for malignant neoplasm of rectum: Secondary | ICD-10-CM | POA: Diagnosis not present

## 2013-01-18 ENCOUNTER — Other Ambulatory Visit: Payer: Self-pay

## 2013-01-25 DIAGNOSIS — H1045 Other chronic allergic conjunctivitis: Secondary | ICD-10-CM | POA: Diagnosis not present

## 2013-01-25 DIAGNOSIS — R05 Cough: Secondary | ICD-10-CM | POA: Diagnosis not present

## 2013-01-25 DIAGNOSIS — J3089 Other allergic rhinitis: Secondary | ICD-10-CM | POA: Diagnosis not present

## 2013-02-05 DIAGNOSIS — M169 Osteoarthritis of hip, unspecified: Secondary | ICD-10-CM | POA: Diagnosis not present

## 2013-02-21 DIAGNOSIS — M169 Osteoarthritis of hip, unspecified: Secondary | ICD-10-CM | POA: Diagnosis not present

## 2013-02-21 DIAGNOSIS — Z7982 Long term (current) use of aspirin: Secondary | ICD-10-CM | POA: Diagnosis not present

## 2013-03-14 DIAGNOSIS — R300 Dysuria: Secondary | ICD-10-CM | POA: Diagnosis not present

## 2013-04-02 ENCOUNTER — Encounter (HOSPITAL_COMMUNITY): Payer: Self-pay | Admitting: Pharmacy Technician

## 2013-04-04 ENCOUNTER — Encounter (HOSPITAL_COMMUNITY)
Admission: RE | Admit: 2013-04-04 | Discharge: 2013-04-04 | Disposition: A | Discharge status: Home / Self Care | Payer: Medicare Other | Source: Ambulatory Visit | Attending: Orthopedic Surgery | Admitting: Orthopedic Surgery

## 2013-04-04 ENCOUNTER — Encounter (HOSPITAL_COMMUNITY): Payer: Self-pay

## 2013-04-04 DIAGNOSIS — M169 Osteoarthritis of hip, unspecified: Secondary | ICD-10-CM | POA: Diagnosis not present

## 2013-04-04 DIAGNOSIS — Z01812 Encounter for preprocedural laboratory examination: Secondary | ICD-10-CM | POA: Diagnosis not present

## 2013-04-04 DIAGNOSIS — M1610 Unilateral primary osteoarthritis, unspecified hip: Secondary | ICD-10-CM | POA: Insufficient documentation

## 2013-04-04 HISTORY — DX: Sleep disorder, unspecified: G47.9

## 2013-04-04 LAB — CBC
HCT: 42.7 % (ref 36.0–46.0)
Hemoglobin: 14.1 g/dL (ref 12.0–15.0)
MCH: 31.3 pg (ref 26.0–34.0)
MCHC: 33 g/dL (ref 30.0–36.0)
MCV: 94.7 fL (ref 78.0–100.0)
Platelets: 242 10*3/uL (ref 150–400)
RBC: 4.51 MIL/uL (ref 3.87–5.11)
RDW: 14.1 % (ref 11.5–15.5)
WBC: 6 10*3/uL (ref 4.0–10.5)

## 2013-04-04 LAB — BASIC METABOLIC PANEL
BUN: 18 mg/dL (ref 6–23)
CO2: 24 mEq/L (ref 19–32)
CREATININE: 0.73 mg/dL (ref 0.50–1.10)
Calcium: 9.9 mg/dL (ref 8.4–10.5)
Chloride: 106 mEq/L (ref 96–112)
GFR calc Af Amer: >90 mL/min (ref >90)
GFR calc non Af Amer: 83 mL/min — ABNORMAL LOW (ref >90)
GLUCOSE: 88 mg/dL (ref 70–99)
Potassium: 4.6 mEq/L (ref 3.7–5.3)
Sodium: 143 mEq/L (ref 137–147)

## 2013-04-04 LAB — URINALYSIS, ROUTINE W REFLEX MICROSCOPIC
BILIRUBIN URINE: NEGATIVE
GLUCOSE, UA: NEGATIVE mg/dL
Hgb urine dipstick: NEGATIVE
KETONES UR: NEGATIVE mg/dL
LEUKOCYTES UA: NEGATIVE
Nitrite: NEGATIVE
Protein, ur: NEGATIVE mg/dL
SPECIFIC GRAVITY, URINE: 1.026 (ref 1.005–1.030)
Urobilinogen, UA: 0.2 mg/dL (ref 0.0–1.0)
pH: 6 (ref 5.0–8.0)

## 2013-04-04 LAB — APTT: APTT: 26 s (ref 24–37)

## 2013-04-04 LAB — SURGICAL PCR SCREEN
MRSA, PCR: NEGATIVE
Staphylococcus aureus: NEGATIVE

## 2013-04-04 LAB — PROTIME-INR
INR: 0.97 (ref 0.00–1.49)
Prothrombin Time: 12.7 seconds (ref 11.6–15.2)

## 2013-04-04 NOTE — H&P (Signed)
TOTAL HIP ADMISSION H&P  Patient is admitted for right total hip arthroplasty, anterior approach.  Subjective:  Chief Complaint:     Right hip OA / pain  HPI: Felicia Hunter, 73 y.o. female, has a history of pain and functional disability in the right hip(s) due to arthritis and patient has failed non-surgical conservative treatments for greater than 12 weeks to include NSAID's and/or analgesics, corticosteriod injections and activity modification.  Onset of symptoms was gradual starting 8+ years ago with gradually worsening course since that time.The patient noted no past surgery on the right hip(s).  Patient currently rates pain in the right hip at 8 out of 10 with activity. Patient has worsening of pain with activity and weight bearing, trendelenberg gait, pain that interfers with activities of daily living and pain with passive range of motion. Patient has evidence of periarticular osteophytes and joint space narrowing by imaging studies. This condition presents safety issues increasing the risk of falls.  There is no current active infection.  Risks, benefits and expectations were discussed with the patient.  Risks including but not limited to the risk of anesthesia, blood clots, nerve damage, blood vessel damage, failure of the prosthesis, infection and up to and including death.  Patient understand the risks, benefits and expectations and wishes to proceed with surgery.   D/C Plans:   Home with HHPT  Post-op Meds:     No Rx given  Tranexamic Acid:   Not to be given - previous CVA  Decadron:    To be given  FYI:    ASA post-op  Norco post-op   Patient Active Problem List   Diagnosis Date Noted  . Other and unspecified hyperlipidemia 06/15/2012  . Lacunar infarction 06/15/2012  . CVA (cerebral infarction)-mid/inf cerebellar infarct 3/25 06/08/2011  . Near syncope 06/07/2011  . 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 DEPRESSION 10/28/2006  . ALLERGIC RHINITIS  10/28/2006  . ASTHMA 10/28/2006   Past Medical History  Diagnosis Date  . GERD (gastroesophageal reflux disease)   . Arthritis   . Cough     PERSISTANT COUGH X 20 YRS  . Tubulovillous adenoma of colon 08/2003  . Diverticulosis   . Hyperlipidemia   . Osteopenia   . Alopecia   . MVP (mitral valve prolapse)   . Clavicle fracture   . Disorders of bilirubin excretion   . Stroke 06/07/11    NO RESIDUAL PROBLEMS  . Difficulty sleeping     takes Ambien  . Breast cancer     Past Surgical History  Procedure Laterality Date  . Collarbone  september 2011    pinned and plated, right  . Abdominal hysterectomy  1993  . Vein surgery  1974    left leg  . Nasal septum surgery    . Tonsillectomy    . Joint replacement  july 9th, 2012    left knee replacement   . Knee reconstruction  7/12    revision lt total knee  . Breast enhancement surgery      x3-then removed 2011 for bilat mastectomies  . Chest wall tumor excision  5/12    noduals removed-neg  . Breast capsulotomy      after bilat mastectomies-implants removed  . Breast surgery  june 2011    double mastectomy nodes from rt  . Mass excision Left 06/19/2012    Procedure: EXCISION OF LARGE MASS LEFT NECK/SHOULDER WITH PLASTIC CLOSURE;  Surgeon: Cristine Polio, MD;  Location: [facility];  Service: Plastics;  Laterality: Left;    No prescriptions prior to admission   Allergies  Allergen Reactions  . Sulfonamide Derivatives Rash    History  Substance Use Topics  . Smoking status: Never Smoker   . Smokeless tobacco: Never Used  . Alcohol Use: 0.6 oz/week    1 Glasses of wine per week     Comment: 1 glass of wine per night    Family History  Problem Relation Age of Onset  . Aneurysm Father      Review of Systems  Constitutional: Negative.   HENT: Negative.   Eyes: Negative.   Respiratory: Negative.   Cardiovascular: Negative.   Gastrointestinal: Negative.   Genitourinary: Negative.   Musculoskeletal:  Positive for joint pain.  Skin: Negative.   Neurological: Negative.   Endo/Heme/Allergies: Positive for environmental allergies.  Psychiatric/Behavioral: Positive for depression.    Objective:  Physical Exam  Constitutional: She appears well-developed and well-nourished.  HENT:  Head: Normocephalic and atraumatic.  Mouth/Throat: Oropharynx is clear and moist.  Eyes: Pupils are equal, round, and reactive to light.  Neck: Neck supple. No JVD present. No tracheal deviation present. No thyromegaly present.  Cardiovascular: Normal rate, regular rhythm, normal heart sounds and intact distal pulses.   Respiratory: Effort normal and breath sounds normal. No respiratory distress. She has no wheezes.  GI: Soft. There is no tenderness. There is no guarding.  Musculoskeletal:       Right hip: She exhibits decreased range of motion, decreased strength, tenderness and bony tenderness. She exhibits no swelling, no deformity and no laceration.  Lymphadenopathy:    She has no cervical adenopathy.  Neurological: She is alert.  Skin: Skin is warm and dry.  Psychiatric: She has a normal mood and affect.    Vital signs in last 24 hours: Temp:  [98.5 F (36.9 C)] 98.5 F (36.9 C) (01/21 1000) Pulse Rate:  [71] 71 (01/21 1000) Resp:  [16] 16 (01/21 1000) BP: (132)/(79) 132/79 mmHg (01/21 1000) SpO2:  [96 %] 96 % (01/21 1000) Weight:  [62.37 kg (137 lb 8 oz)] 62.37 kg (137 lb 8 oz) (01/21 1000)  Labs:   Estimated body mass index is 23.16 kg/(m^2) as calculated from the following:   Height as of 07/03/12: 5' 5.5" (1.664 m).   Weight as of 07/03/12: 64.139 kg (141 lb 6.4 oz).   Imaging Review Plain radiographs demonstrate severe degenerative joint disease of the right hip(s). The bone quality appears to be good for age and reported activity level.  Assessment/Plan:  End stage arthritis, right hip(s)  The patient history, physical examination, clinical judgement of the provider and imaging  studies are consistent with end stage degenerative joint disease of the right hip(s) and total hip arthroplasty is deemed medically necessary. The treatment options including medical management, injection therapy, arthroscopy and arthroplasty were discussed at length. The risks and benefits of total hip arthroplasty were presented and reviewed. The risks due to aseptic loosening, infection, stiffness, dislocation/subluxation,  thromboembolic complications and other imponderables were discussed.  The patient acknowledged the explanation, agreed to proceed with the plan and consent was signed. Patient is being admitted for inpatient treatment for surgery, pain control, PT, OT, prophylactic antibiotics, VTE prophylaxis, progressive ambulation and ADL's and discharge planning.The patient is planning to be discharged home with home health services.    [place] Pugh Caley Volkert   PAC  04/04/2013, 2:55 PM

## 2013-04-04 NOTE — Patient Instructions (Addendum)
Felicia Hunter  04/04/2013                           YOUR PROCEDURE IS SCHEDULED ON: 04/10/13               PLEASE REPORT TO SHORT STAY CENTER AT : 6:00 AM               CALL THIS NUMBER IF ANY PROBLEMS THE DAY OF SURGERY :               [phone]                      REMEMBER:   Do not eat food or drink liquids AFTER MIDNIGHT   Take these medicines the morning of surgery with A SIP OF WATER: NONE   Do not wear jewelry, make-up   Do not wear lotions, powders, or perfumes.   Do not shave legs or underarms 12 hrs. before surgery (men may shave face)  Do not bring valuables to the hospital.  Contacts, dentures or bridgework may not be worn into surgery.  Leave suitcase in the car. After surgery it may be brought to your room.  For patients admitted to the hospital more than one night, checkout time is 11:00                          The day of discharge.   Patients discharged the day of surgery will not be allowed to drive home                             If going home same day of surgery, must have someone stay with you first                           24 hrs at home and arrange for some one to drive you home from hospital.    Special Instructions:   Please read over the following fact sheets that you were given:               1. MRSA  INFORMATION                      2. [place]                                  3. [place]                                                  X_____________________________________________________________________        Failure to follow these instructions may result in cancellation of your surgery

## 2013-04-10 ENCOUNTER — Encounter (HOSPITAL_COMMUNITY)
Admission: RE | Disposition: A | Discharge status: Home Health | Payer: Self-pay | Source: Ambulatory Visit | Attending: Orthopedic Surgery

## 2013-04-10 ENCOUNTER — Inpatient Hospital Stay (HOSPITAL_COMMUNITY): Payer: Medicare Other

## 2013-04-10 ENCOUNTER — Inpatient Hospital Stay (HOSPITAL_COMMUNITY): Payer: Medicare Other | Admitting: Registered Nurse

## 2013-04-10 ENCOUNTER — Encounter (HOSPITAL_COMMUNITY): Payer: Self-pay | Admitting: *Deleted

## 2013-04-10 ENCOUNTER — Inpatient Hospital Stay (HOSPITAL_COMMUNITY)
Admission: RE | Admit: 2013-04-10 | Discharge: 2013-04-11 | DRG: 470 | Disposition: A | Discharge status: Home Health | Payer: Medicare Other | Source: Ambulatory Visit | Attending: Orthopedic Surgery | Admitting: Orthopedic Surgery

## 2013-04-10 ENCOUNTER — Encounter (HOSPITAL_COMMUNITY): Payer: Medicare Other | Admitting: Registered Nurse

## 2013-04-10 DIAGNOSIS — D62 Acute posthemorrhagic anemia: Secondary | ICD-10-CM | POA: Diagnosis not present

## 2013-04-10 DIAGNOSIS — F329 Major depressive disorder, single episode, unspecified: Secondary | ICD-10-CM | POA: Diagnosis present

## 2013-04-10 DIAGNOSIS — Z471 Aftercare following joint replacement surgery: Secondary | ICD-10-CM | POA: Diagnosis not present

## 2013-04-10 DIAGNOSIS — Z96649 Presence of unspecified artificial hip joint: Secondary | ICD-10-CM

## 2013-04-10 DIAGNOSIS — E785 Hyperlipidemia, unspecified: Secondary | ICD-10-CM | POA: Diagnosis present

## 2013-04-10 DIAGNOSIS — Z01812 Encounter for preprocedural laboratory examination: Secondary | ICD-10-CM | POA: Diagnosis not present

## 2013-04-10 DIAGNOSIS — M169 Osteoarthritis of hip, unspecified: Secondary | ICD-10-CM | POA: Diagnosis not present

## 2013-04-10 DIAGNOSIS — Z8673 Personal history of transient ischemic attack (TIA), and cerebral infarction without residual deficits: Secondary | ICD-10-CM

## 2013-04-10 DIAGNOSIS — E871 Hypo-osmolality and hyponatremia: Secondary | ICD-10-CM | POA: Diagnosis not present

## 2013-04-10 DIAGNOSIS — F3289 Other specified depressive episodes: Secondary | ICD-10-CM | POA: Diagnosis present

## 2013-04-10 DIAGNOSIS — K219 Gastro-esophageal reflux disease without esophagitis: Secondary | ICD-10-CM | POA: Diagnosis present

## 2013-04-10 DIAGNOSIS — M1610 Unilateral primary osteoarthritis, unspecified hip: Principal | ICD-10-CM | POA: Diagnosis present

## 2013-04-10 DIAGNOSIS — Z853 Personal history of malignant neoplasm of breast: Secondary | ICD-10-CM | POA: Diagnosis not present

## 2013-04-10 DIAGNOSIS — Z96659 Presence of unspecified artificial knee joint: Secondary | ICD-10-CM

## 2013-04-10 DIAGNOSIS — Z9010 Acquired absence of unspecified breast and nipple: Secondary | ICD-10-CM

## 2013-04-10 DIAGNOSIS — I059 Rheumatic mitral valve disease, unspecified: Secondary | ICD-10-CM | POA: Diagnosis present

## 2013-04-10 DIAGNOSIS — M25559 Pain in unspecified hip: Secondary | ICD-10-CM | POA: Diagnosis not present

## 2013-04-10 DIAGNOSIS — J45909 Unspecified asthma, uncomplicated: Secondary | ICD-10-CM | POA: Diagnosis present

## 2013-04-10 DIAGNOSIS — K5730 Diverticulosis of large intestine without perforation or abscess without bleeding: Secondary | ICD-10-CM | POA: Diagnosis not present

## 2013-04-10 DIAGNOSIS — L659 Nonscarring hair loss, unspecified: Secondary | ICD-10-CM | POA: Diagnosis present

## 2013-04-10 HISTORY — PX: TOTAL HIP ARTHROPLASTY: SHX124

## 2013-04-10 LAB — TYPE AND SCREEN
ABO/RH(D): A POS
ANTIBODY SCREEN: NEGATIVE

## 2013-04-10 SURGERY — ARTHROPLASTY, HIP, TOTAL, ANTERIOR APPROACH
Anesthesia: Spinal | Site: Hip | Laterality: Right

## 2013-04-10 MED ORDER — ONDANSETRON HCL 4 MG/2ML IJ SOLN
4.0000 mg | Freq: Four times a day (QID) | INTRAMUSCULAR | Status: DC | PRN
Start: 2013-04-10 — End: 2013-04-11

## 2013-04-10 MED ORDER — GLYCOPYRROLATE 0.2 MG/ML IJ SOLN
INTRAMUSCULAR | Status: DC | PRN
  Administered 2013-04-10: 0.1 mg via INTRAVENOUS

## 2013-04-10 MED ORDER — DEXAMETHASONE SODIUM PHOSPHATE 10 MG/ML IJ SOLN
INTRAMUSCULAR | Status: AC
  Filled 2013-04-10: qty 1

## 2013-04-10 MED ORDER — DEXAMETHASONE SODIUM PHOSPHATE 10 MG/ML IJ SOLN
10.0000 mg | Freq: Once | INTRAMUSCULAR | Status: AC
  Administered 2013-04-10: 10 mg via INTRAVENOUS

## 2013-04-10 MED ORDER — FENTANYL CITRATE 0.05 MG/ML IJ SOLN
INTRAMUSCULAR | Status: DC | PRN
  Administered 2013-04-10 (×2): 50 ug via INTRAVENOUS

## 2013-04-10 MED ORDER — PROPOFOL INFUSION 10 MG/ML OPTIME
INTRAVENOUS | Status: DC | PRN
  Administered 2013-04-10: 100 ug/kg/min via INTRAVENOUS

## 2013-04-10 MED ORDER — STERILE WATER FOR IRRIGATION IR SOLN
Status: DC | PRN
  Administered 2013-04-10: 1500 mL

## 2013-04-10 MED ORDER — HYDROMORPHONE HCL PF 1 MG/ML IJ SOLN
0.5000 mg | INTRAMUSCULAR | Status: DC | PRN
  Administered 2013-04-10: 0.5 mg via INTRAVENOUS
  Filled 2013-04-10: qty 1

## 2013-04-10 MED ORDER — SODIUM CHLORIDE 0.9 % IV SOLN
100.0000 mL/h | INTRAVENOUS | Status: DC
  Administered 2013-04-10: 100 mL/h via INTRAVENOUS
  Filled 2013-04-10 (×7): qty 1000

## 2013-04-10 MED ORDER — PHENYLEPHRINE HCL 10 MG/ML IJ SOLN
INTRAMUSCULAR | Status: AC
  Filled 2013-04-10: qty 1

## 2013-04-10 MED ORDER — OXYCODONE HCL 5 MG/5ML PO SOLN
5.0000 mg | Freq: Once | ORAL | Status: DC | PRN
  Filled 2013-04-10: qty 5

## 2013-04-10 MED ORDER — FENTANYL CITRATE 0.05 MG/ML IJ SOLN
INTRAMUSCULAR | Status: AC
  Filled 2013-04-10: qty 2

## 2013-04-10 MED ORDER — METHOCARBAMOL 500 MG PO TABS: 500.0000 mg | ORAL_TABLET | Freq: Four times a day (QID) | ORAL | Status: DC | PRN

## 2013-04-10 MED ORDER — PROPOFOL 10 MG/ML IV BOLUS
INTRAVENOUS | Status: AC
  Filled 2013-04-10: qty 20

## 2013-04-10 MED ORDER — HYDROMORPHONE HCL PF 1 MG/ML IJ SOLN
INTRAMUSCULAR | Status: AC
  Filled 2013-04-10: qty 1

## 2013-04-10 MED ORDER — HYDROCODONE-ACETAMINOPHEN 7.5-325 MG PO TABS
1.0000 | ORAL_TABLET | ORAL | Status: DC
  Administered 2013-04-10 – 2013-04-11 (×5): 2 via ORAL
  Filled 2013-04-10 (×5): qty 2

## 2013-04-10 MED ORDER — METOCLOPRAMIDE HCL 10 MG PO TABS
5.0000 mg | ORAL_TABLET | Freq: Three times a day (TID) | ORAL | Status: DC | PRN
  Administered 2013-04-11: 10 mg via ORAL
  Filled 2013-04-10: qty 1

## 2013-04-10 MED ORDER — CEFAZOLIN SODIUM-DEXTROSE 2-3 GM-% IV SOLR
2.0000 g | Freq: Four times a day (QID) | INTRAVENOUS | Status: AC
  Administered 2013-04-10 (×2): 2 g via INTRAVENOUS
  Filled 2013-04-10 (×2): qty 50

## 2013-04-10 MED ORDER — PHENOL 1.4 % MT LIQD: 1.0000 | OROMUCOSAL | Status: DC | PRN

## 2013-04-10 MED ORDER — MEPERIDINE HCL 50 MG/ML IJ SOLN: 6.2500 mg | INTRAMUSCULAR | Status: DC | PRN

## 2013-04-10 MED ORDER — ZOLPIDEM TARTRATE 5 MG PO TABS
5.0000 mg | ORAL_TABLET | Freq: Every evening | ORAL | Status: DC | PRN
  Administered 2013-04-10: 5 mg via ORAL
  Filled 2013-04-10: qty 1

## 2013-04-10 MED ORDER — METHOCARBAMOL 100 MG/ML IJ SOLN
500.0000 mg | Freq: Four times a day (QID) | INTRAVENOUS | Status: DC | PRN
  Administered 2013-04-10: 500 mg via INTRAVENOUS
  Filled 2013-04-10 (×2): qty 5

## 2013-04-10 MED ORDER — METOCLOPRAMIDE HCL 5 MG/ML IJ SOLN
5.0000 mg | Freq: Three times a day (TID) | INTRAMUSCULAR | Status: DC | PRN
  Administered 2013-04-10: 10 mg via INTRAVENOUS
  Filled 2013-04-10: qty 2

## 2013-04-10 MED ORDER — GLYCOPYRROLATE 0.2 MG/ML IJ SOLN
INTRAMUSCULAR | Status: AC
  Filled 2013-04-10: qty 1

## 2013-04-10 MED ORDER — DIPHENHYDRAMINE HCL 25 MG PO CAPS: 25.0000 mg | ORAL_CAPSULE | Freq: Four times a day (QID) | ORAL | Status: DC | PRN

## 2013-04-10 MED ORDER — ALUM & MAG HYDROXIDE-SIMETH 200-200-20 MG/5ML PO SUSP: 30.0000 mL | ORAL | Status: DC | PRN

## 2013-04-10 MED ORDER — CEFAZOLIN SODIUM-DEXTROSE 2-3 GM-% IV SOLR
2.0000 g | INTRAVENOUS | Status: AC
  Administered 2013-04-10: 2 g via INTRAVENOUS

## 2013-04-10 MED ORDER — BISACODYL 10 MG RE SUPP: 10.0000 mg | Freq: Every day | RECTAL | Status: DC | PRN

## 2013-04-10 MED ORDER — LACTATED RINGERS IV SOLN
INTRAVENOUS | Status: DC
  Administered 2013-04-10: 09:00:00 via INTRAVENOUS
  Administered 2013-04-10: 1000 mL via INTRAVENOUS

## 2013-04-10 MED ORDER — FLEET ENEMA 7-19 GM/118ML RE ENEM: 1.0000 | ENEMA | Freq: Once | RECTAL | Status: AC | PRN

## 2013-04-10 MED ORDER — LIDOCAINE HCL (CARDIAC) 20 MG/ML IV SOLN
INTRAVENOUS | Status: DC | PRN
  Administered 2013-04-10: 25 mg via INTRAVENOUS
  Administered 2013-04-10: 50 mg via INTRAVENOUS

## 2013-04-10 MED ORDER — HYDROMORPHONE HCL PF 1 MG/ML IJ SOLN
0.2500 mg | INTRAMUSCULAR | Status: DC | PRN
  Administered 2013-04-10: 0.5 mg via INTRAVENOUS

## 2013-04-10 MED ORDER — PHENYLEPHRINE HCL 10 MG/ML IJ SOLN
INTRAMUSCULAR | Status: DC | PRN
  Administered 2013-04-10: 80 ug via INTRAVENOUS
  Administered 2013-04-10: 160 ug via INTRAVENOUS

## 2013-04-10 MED ORDER — ASPIRIN EC 325 MG PO TBEC
325.0000 mg | DELAYED_RELEASE_TABLET | Freq: Two times a day (BID) | ORAL | Status: DC
  Administered 2013-04-11: 325 mg via ORAL
  Filled 2013-04-10 (×3): qty 1

## 2013-04-10 MED ORDER — 0.9 % SODIUM CHLORIDE (POUR BTL) OPTIME
TOPICAL | Status: DC | PRN
  Administered 2013-04-10: 1000 mL

## 2013-04-10 MED ORDER — HYDROCODONE-ACETAMINOPHEN 7.5-325 MG PO TABS
1.0000 | ORAL_TABLET | ORAL | Status: DC
Start: 2013-04-10 — End: 2013-04-10

## 2013-04-10 MED ORDER — POLYETHYLENE GLYCOL 3350 17 G PO PACK: 17.0000 g | PACK | Freq: Two times a day (BID) | ORAL | Status: DC

## 2013-04-10 MED ORDER — CEFAZOLIN SODIUM-DEXTROSE 2-3 GM-% IV SOLR
INTRAVENOUS | Status: AC
  Filled 2013-04-10: qty 50

## 2013-04-10 MED ORDER — PROPOFOL 10 MG/ML IV BOLUS
INTRAVENOUS | Status: DC | PRN
  Administered 2013-04-10: 20 mg via INTRAVENOUS
  Administered 2013-04-10: 40 mg via INTRAVENOUS

## 2013-04-10 MED ORDER — MENTHOL 3 MG MT LOZG
1.0000 | LOZENGE | OROMUCOSAL | Status: DC | PRN
Start: 2013-04-10 — End: 2013-04-11

## 2013-04-10 MED ORDER — LIDOCAINE HCL (CARDIAC) 20 MG/ML IV SOLN
INTRAVENOUS | Status: AC
  Filled 2013-04-10: qty 5

## 2013-04-10 MED ORDER — CHLORHEXIDINE GLUCONATE 4 % EX LIQD: 60.0000 mL | Freq: Once | CUTANEOUS | Status: DC

## 2013-04-10 MED ORDER — PHENYLEPHRINE HCL 10 MG/ML IJ SOLN
10.0000 mg | INTRAVENOUS | Status: DC | PRN
  Administered 2013-04-10: 10 ug/min via INTRAVENOUS

## 2013-04-10 MED ORDER — DEXAMETHASONE SODIUM PHOSPHATE 10 MG/ML IJ SOLN
10.0000 mg | Freq: Once | INTRAMUSCULAR | Status: AC
  Administered 2013-04-11: 10 mg via INTRAVENOUS
  Filled 2013-04-10: qty 1

## 2013-04-10 MED ORDER — MIDAZOLAM HCL 5 MG/5ML IJ SOLN
INTRAMUSCULAR | Status: DC | PRN
  Administered 2013-04-10: 2 mg via INTRAVENOUS

## 2013-04-10 MED ORDER — ONDANSETRON HCL 4 MG PO TABS: 4.0000 mg | ORAL_TABLET | Freq: Four times a day (QID) | ORAL | Status: DC | PRN

## 2013-04-10 MED ORDER — ATORVASTATIN CALCIUM 20 MG PO TABS
20.0000 mg | ORAL_TABLET | Freq: Every evening | ORAL | Status: DC
  Administered 2013-04-10: 20 mg via ORAL
  Filled 2013-04-10 (×2): qty 1

## 2013-04-10 MED ORDER — PROMETHAZINE HCL 25 MG/ML IJ SOLN: 6.2500 mg | INTRAMUSCULAR | Status: DC | PRN

## 2013-04-10 MED ORDER — OXYCODONE HCL 5 MG PO TABS: 5.0000 mg | ORAL_TABLET | Freq: Once | ORAL | Status: DC | PRN

## 2013-04-10 MED ORDER — DOCUSATE SODIUM 100 MG PO CAPS
100.0000 mg | ORAL_CAPSULE | Freq: Two times a day (BID) | ORAL | Status: DC
  Administered 2013-04-10: 100 mg via ORAL

## 2013-04-10 MED ORDER — FERROUS SULFATE 325 (65 FE) MG PO TABS
325.0000 mg | ORAL_TABLET | Freq: Three times a day (TID) | ORAL | Status: DC
  Administered 2013-04-10: 325 mg via ORAL
  Filled 2013-04-10 (×5): qty 1

## 2013-04-10 MED ORDER — MIDAZOLAM HCL 2 MG/2ML IJ SOLN
INTRAMUSCULAR | Status: AC
  Filled 2013-04-10: qty 2

## 2013-04-10 SURGICAL SUPPLY — 41 items
ADH SKN CLS APL DERMABOND .7 (GAUZE/BANDAGES/DRESSINGS) ×1
BAG SPEC THK2 15X12 ZIP CLS (MISCELLANEOUS)
BAG ZIPLOCK 12X15 (MISCELLANEOUS) IMPLANT
BLADE SAW SGTL 18X1.27X75 (BLADE) ×2 IMPLANT
BLADE SAW SGTL 18X1.27X75MM (BLADE) ×1
CAPT HIP PF MOP ×2 IMPLANT
DERMABOND ADVANCED (GAUZE/BANDAGES/DRESSINGS) ×2
DERMABOND ADVANCED .7 DNX12 (GAUZE/BANDAGES/DRESSINGS) ×1 IMPLANT
DRAPE C-ARM 42X120 X-RAY (DRAPES) ×3 IMPLANT
DRAPE POUCH INSTRU U-SHP 10X18 (DRAPES) IMPLANT
DRAPE STERI IOBAN 125X83 (DRAPES) ×3 IMPLANT
DRAPE U-SHAPE 47X51 STRL (DRAPES) ×9 IMPLANT
DRSG AQUACEL AG ADV 3.5X10 (GAUZE/BANDAGES/DRESSINGS) ×3 IMPLANT
DRSG TEGADERM 4X4.75 (GAUZE/BANDAGES/DRESSINGS) IMPLANT
DURAPREP 26ML APPLICATOR (WOUND CARE) ×3 IMPLANT
ELECT BLADE TIP CTD 4 INCH (ELECTRODE) ×3 IMPLANT
ELECT REM PT RETURN 9FT ADLT (ELECTROSURGICAL) ×3
ELECTRODE REM PT RTRN 9FT ADLT (ELECTROSURGICAL) ×1 IMPLANT
EVACUATOR 1/8 PVC DRAIN (DRAIN) IMPLANT
FACESHIELD LNG OPTICON STERILE (SAFETY) ×12 IMPLANT
GAUZE SPONGE 2X2 8PLY STRL LF (GAUZE/BANDAGES/DRESSINGS) IMPLANT
GLOVE BIOGEL PI IND STRL 7.5 (GLOVE) ×1 IMPLANT
GLOVE BIOGEL PI IND STRL 8 (GLOVE) ×1 IMPLANT
GLOVE BIOGEL PI INDICATOR 7.5 (GLOVE) ×2
GLOVE BIOGEL PI INDICATOR 8 (GLOVE) ×2
GLOVE ECLIPSE 8.0 STRL XLNG CF (GLOVE) ×3 IMPLANT
GLOVE ORTHO TXT STRL SZ7.5 (GLOVE) ×6 IMPLANT
GOWN SPEC L3 XXLG W/TWL (GOWN DISPOSABLE) ×3 IMPLANT
GOWN STRL REUS W/TWL LRG LVL3 (GOWN DISPOSABLE) ×3 IMPLANT
KIT BASIN OR (CUSTOM PROCEDURE TRAY) ×3 IMPLANT
PACK TOTAL JOINT (CUSTOM PROCEDURE TRAY) ×3 IMPLANT
PADDING CAST COTTON 6X4 STRL (CAST SUPPLIES) ×3 IMPLANT
SPONGE GAUZE 2X2 STER 10/PKG (GAUZE/BANDAGES/DRESSINGS)
SUT MNCRL AB 4-0 PS2 18 (SUTURE) ×3 IMPLANT
SUT VIC AB 1 CT1 36 (SUTURE) ×9 IMPLANT
SUT VIC AB 2-0 CT1 27 (SUTURE) ×6
SUT VIC AB 2-0 CT1 TAPERPNT 27 (SUTURE) ×2 IMPLANT
SUT VLOC 180 0 24IN GS25 (SUTURE) ×3 IMPLANT
TOWEL OR 17X26 10 PK STRL BLUE (TOWEL DISPOSABLE) ×3 IMPLANT
TOWEL OR NON WOVEN STRL DISP B (DISPOSABLE) IMPLANT
TRAY FOLEY CATH 14FRSI W/METER (CATHETERS) IMPLANT

## 2013-04-10 NOTE — Anesthesia Postprocedure Evaluation (Signed)
Anesthesia Post Note  Patient: Felicia Hunter  Procedure(s) Performed: Procedure(s) (LRB): RIGHT TOTAL HIP ARTHROPLASTY ANTERIOR APPROACH (Right)  Anesthesia type: Spinal  Patient location: PACU  Post pain: Pain level controlled  Post assessment: Post-op Vital signs reviewed  Last Vitals: BP 111/56  Pulse 51  Temp(Src) 36 C (Oral)  Resp 18  SpO2 100%  Post vital signs: Reviewed  Level of consciousness: sedated  Complications: No apparent anesthesia complications

## 2013-04-10 NOTE — Anesthesia Preprocedure Evaluation (Signed)
Anesthesia Evaluation  Patient identified by MRN, date of birth, ID band Patient awake    Reviewed: Allergy & Precautions, H&P , NPO status , Patient's Chart, lab work & pertinent test results  Airway Mallampati: II TM Distance: >3 FB Neck ROM: Full    Dental no notable dental hx. (+) Teeth Intact and Dental Advisory Given   Pulmonary asthma ,  breath sounds clear to auscultation  Pulmonary exam normal       Cardiovascular + Valvular Problems/Murmurs MVP Rhythm:Regular Rate:Normal     Neuro/Psych PSYCHIATRIC DISORDERS Depression CVA, No Residual Symptoms    GI/Hepatic Neg liver ROS, GERD-  Medicated and Controlled,  Endo/Other  negative endocrine ROS  Renal/GU negative Renal ROS     Musculoskeletal   Abdominal   Peds  Hematology negative hematology ROS (+)   Anesthesia Other Findings   Reproductive/Obstetrics negative OB ROS                           Anesthesia Physical  Anesthesia Plan  ASA: II  Anesthesia Plan: Spinal   Post-op Pain Management:    Induction: Intravenous  Airway Management Planned:   Additional Equipment:   Intra-op Plan:   Post-operative Plan:   Informed Consent: I have reviewed the patients History and Physical, chart, labs and discussed the procedure including the risks, benefits and alternatives for the proposed anesthesia with the patient or authorized representative who has indicated his/her understanding and acceptance.   Dental advisory given  Plan Discussed with: CRNA  Anesthesia Plan Comments:         Anesthesia Quick Evaluation

## 2013-04-10 NOTE — Progress Notes (Signed)
Portable AP Pelvis and Lateral Right Hip X-Rays done.

## 2013-04-10 NOTE — Progress Notes (Signed)
UR completed 

## 2013-04-10 NOTE — Transfer of Care (Signed)
Immediate Anesthesia Transfer of Care Note  Patient: Felicia Hunter  Procedure(s) Performed: Procedure(s): RIGHT TOTAL HIP ARTHROPLASTY ANTERIOR APPROACH (Right)  Patient Location: PACU  Anesthesia Type:Spinal  Level of Consciousness: awake, alert , oriented and patient cooperative  Airway & Oxygen Therapy: Patient Spontanous Breathing and Patient connected to face mask oxygen  Post-op Assessment: Report given to PACU RN and Post -op Vital signs reviewed and stable  Post vital signs: stable  Complications: No apparent anesthesia complications

## 2013-04-10 NOTE — Addendum Note (Signed)
Addendum created 04/10/13 1125 by Lissa Morales, CRNA   Modules edited: Anesthesia Medication Administration

## 2013-04-10 NOTE — Progress Notes (Signed)
X-ray results noted 

## 2013-04-10 NOTE — Interval H&P Note (Signed)
History and Physical Interval Note:  04/10/2013 7:49 AM  Felicia Hunter  has presented today for surgery, with the diagnosis of RIGHT HIP OA  The various methods of treatment have been discussed with the patient and family. After consideration of risks, benefits and other options for treatment, the patient has consented to  Procedure(s): RIGHT TOTAL HIP ARTHROPLASTY ANTERIOR APPROACH (Right) as a surgical intervention .  The patient's history has been reviewed, patient examined, no change in status, stable for surgery.  I have reviewed the patient's chart and labs.  Questions were answered to the patient's satisfaction.     [name]

## 2013-04-10 NOTE — Evaluation (Signed)
Physical Therapy Evaluation Patient Details Name: Felicia Hunter MRN: [id number] DOB: 24-Feb-1941 Today's Date: 04/10/2013 Time: 1635-1700 PT Time Calculation (min): 25 min  PT Assessment / Plan / Recommendation History of Present Illness  R direct anterior approach THA  Clinical Impression  Pt with R Ant THA presents with decreased mobility to benefit from PT to help continue to be modified independent to return home safely.     PT Assessment  Patient needs continued PT services    Follow Up Recommendations  Home health PT    Does the patient have the potential to tolerate intense rehabilitation      Barriers to Discharge        Equipment Recommendations  None recommended by PT    Recommendations for Other Services     Frequency 7X/week    Precautions / Restrictions Precautions Precautions: None Precaution Comments: direct anterior approach , no precautions Restrictions Weight Bearing Restrictions: No (WBAT RLE)   Pertinent Vitals/Pain Very little 3/10 R  In quad muscle, ice applied on hip after session       Mobility  Bed Mobility Overal bed mobility: Needs Assistance Bed Mobility: Supine to Sit;Sit to Supine Supine to sit: Min assist General bed mobility comments: minimal asssited LE off bed and VCs for sequencing Transfers Overall transfer level: Needs assistance Equipment used: Rolling walker (2 wheeled) Transfers: Sit to/from Stand Sit to Stand: Min guard General transfer comment: cues for hand placement safety Ambulation/Gait Ambulation/Gait assistance: Min guard Ambulation Distance (Feet): 50 Feet Assistive device: Rolling walker (2 wheeled) Gait Pattern/deviations: Step-to pattern Gait velocity: slow General Gait Details: will likely begin step through pattern tomorrow, doing very nice    Exercises Total Joint Exercises Ankle Circles/Pumps: AROM;Both Quad Sets: AROM;Both;10 reps Heel Slides: AAROM;Right;10 reps;Supine Hip ABduction/ADduction:  AAROM;Right;10 reps;Supine Straight Leg Raises: AAROM;Right;10 reps;Supine   PT Diagnosis: Difficulty walking  PT Problem List: Decreased strength;Decreased activity tolerance;Decreased mobility;Decreased knowledge of use of DME PT Treatment Interventions: DME instruction;Gait training;Stair training;Functional mobility training;Therapeutic activities;Patient/family education     PT Goals(Current goals can be found in the care plan section) Acute Rehab PT Goals Patient Stated Goal: to get home tomorrow  PT Goal Formulation: With patient Time For Goal Achievement: 04/13/13 Potential to Achieve Goals: Good  Visit Information  Last PT Received On: 04/10/13 Assistance Needed: +1 History of Present Illness: R direct anterior approach THA       Prior [name] expects to be discharged to:: Private residence Living Arrangements: Alone Available Help at Discharge: Family Type of Home: House Home Access: Stairs to enter [employer] of Steps: 2-3 (I should have no problem with those, I can hold on to a wall) Home Layout: Two level;Able to live on main level with bedroom/bathroom Home Equipment: Gilford Rile - 2 wheels;Cane - single point;Shower seat - built in;Bedside commode Additional Comments: Dtr and grandtr live close by and plan on staying with her until she doesnt need them.  Prior Function Level of Independence: Independent Comments: water aerobics 3x /week Communication Communication: No difficulties    Cognition  Cognition Arousal/Alertness: Awake/alert Behavior During Therapy: WFL for tasks assessed/performed Overall Cognitive Status: Within Functional Limits for tasks assessed    Extremity/Trunk Assessment Lower Extremity Assessment Lower Extremity Assessment: Overall WFL for tasks assessed (had good strength returning to R side , just sore in r quad)   Balance    End of Session PT - End of Session Equipment Utilized During  Treatment: Gait belt Activity Tolerance: Patient  tolerated treatment well Patient left: in chair;with call bell/phone within reach;with family/visitor present Nurse Communication: Mobility status  GP     Clide Dales 04/10/2013, 5:11 PM Clide Dales, PT Pager: [phone] 04/10/2013

## 2013-04-10 NOTE — Op Note (Signed)
NAME:  Felicia Hunter                ACCOUNT NO.: [id number]      MEDICAL RECORD NO.: [id number]      FACILITY:  [facility]      PHYSICIAN:  Paralee Cancel D  DATE OF BIRTH:  September 01, 1940     DATE OF PROCEDURE:  04/10/2013                                 OPERATIVE REPORT         PREOPERATIVE DIAGNOSIS: Right  hip osteoarthritis.      POSTOPERATIVE DIAGNOSIS:  Right hip osteoarthritis.      PROCEDURE:  Right total hip replacement through an anterior approach   utilizing DePuy THR system, component size 62mm pinnacle cup, a size 36+4 neutral   Altrex liner, a size 3 Hi Tri Lock stem with a 32+1.5 Articuleze metal head ball.      SURGEON:  Pietro Cassis. Alvan Dame, M.D.      ASSISTANT:  Molli Barrows, PA-C      ANESTHESIA:  Spinal.      SPECIMENS:  None.      COMPLICATIONS:  None.      BLOOD LOSS:  400 cc     DRAINS:  None.      INDICATION OF THE PROCEDURE:  Felicia Hunter is a 73 y.o. female who had   presented to office for evaluation of right hip pain.  Radiographs revealed   progressive degenerative changes with bone-on-bone   articulation to the  hip joint.  The patient had painful limited range of   motion significantly affecting their overall quality of life.  The patient was failing to    respond to conservative measures, and at this point was ready   to proceed with more definitive measures.  The patient has noted progressive   degenerative changes in his hip, progressive problems and dysfunction   with regarding the hip prior to surgery.  Consent was obtained for   benefit of pain relief.  Specific risk of infection, DVT, component   failure, dislocation, need for revision surgery, as well discussion of   the anterior versus posterior approach were reviewed.  Consent was   obtained for benefit of anterior pain relief through an anterior   approach.      PROCEDURE IN DETAIL:  The patient was brought to operative theater.   Once adequate anesthesia,  preoperative antibiotics, 2gm Ancef administered.   The patient was positioned supine on the OSI Hanna table.  Once adequate   padding of boney process was carried out, we had predraped out the hip, and  used fluoroscopy to confirm orientation of the pelvis and position.      The right hip was then prepped and draped from proximal iliac crest to   mid thigh with shower curtain technique.      Time-out was performed identifying the patient, planned procedure, and   extremity.     An incision was then made 2 cm distal and lateral to the   anterior superior iliac spine extending over the orientation of the   tensor fascia lata muscle and sharp dissection was carried down to the   fascia of the muscle and protractor placed in the soft tissues.      The fascia was then incised.  The muscle belly was identified and swept  laterally and retractor placed along the superior neck.  Following   cauterization of the circumflex vessels and removing some pericapsular   fat, a second cobra retractor was placed on the inferior neck.  A third   retractor was placed on the anterior acetabulum after elevating the   anterior rectus.  A L-capsulotomy was along the line of the   superior neck to the trochanteric fossa, then extended proximally and   distally.  Tag sutures were placed and the retractors were then placed   intracapsular.  We then identified the trochanteric fossa and   orientation of my neck cut, confirmed this radiographically   and then made a neck osteotomy with the femur on traction.  The femoral   head was removed without difficulty or complication.  Traction was let   off and retractors were placed posterior and anterior around the   acetabulum.      The labrum and foveal tissue were debrided.  I began reaming with a 60mm   reamer and reamed up to 74mm reamer with good bony bed preparation and a 52   cup was chosen.  The final 57mm Pinnacle cup was then impacted under fluoroscopy  to  confirm the depth of penetration and orientation with respect to   abduction.  A screw was placed followed by the hole eliminator.  The final   36+4 neutral Altrex liner was impacted with good visualized rim fit.  The cup was positioned anatomically within the acetabular portion of the pelvis.      At this point, the femur was rolled at 80 degrees.  Further capsule was   released off the inferior aspect of the femoral neck.  I then   released the superior capsule proximally.  The hook was placed laterally   along the femur and elevated manually and held in position with the bed   hook.  The leg was then extended and adducted with the leg rolled to 100   degrees of external rotation.  Once the proximal femur was fully   exposed, I used a box osteotome to set orientation.  I then began   broaching with the starting chili pepper broach and passed this by hand and then broached up to 3.  With the 3 broach in place I chose a high offset neck and did a trial reduction.  The offset was appropriate.  She has advanced left hip arthritis and will shortly undergo left total hip.  We are attempting to restore her normal anatomy.  Given these findings, I went ahead and dislocated the hip, repositioned all   retractors and positioned the right hip in the extended and abducted position.  The final 3 Hi Tri Lock stem was   chosen and it was impacted down to the level of neck cut.  Based on this   and the trial reduction, a 36+1.5 Articuleze metal ball was chosen and   impacted onto a clean and dry trunnion, and the hip was reduced.  The   hip had been irrigated throughout the case again at this point.  I did   reapproximate the superior capsular leaflet to the anterior leaflet   using #1 Vicryl.  The fascia of the   tensor fascia lata muscle was then reapproximated using #1 Vicryl and #0 V-lock sutures.  The   remaining wound was closed with 2-0 Vicryl and running 4-0 Monocryl.   The hip was cleaned, dried,  and dressed sterilely using Dermabond and  Aquacel dressing.  She was then brought   to recovery room in stable condition tolerating the procedure well.    Molli Barrows, PA-C was present for the entirety of the case involved from   preoperative positioning, perioperative retractor management, general   facilitation of the case, as well as primary wound closure as assistant.            Pietro Cassis Alvan Dame, M.D.        04/10/2013 10:12 AM

## 2013-04-11 DIAGNOSIS — D62 Acute posthemorrhagic anemia: Secondary | ICD-10-CM | POA: Diagnosis not present

## 2013-04-11 DIAGNOSIS — E871 Hypo-osmolality and hyponatremia: Secondary | ICD-10-CM | POA: Clinically undetermined

## 2013-04-11 LAB — BASIC METABOLIC PANEL
BUN: 12 mg/dL (ref 6–23)
CALCIUM: 8.4 mg/dL (ref 8.4–10.5)
CO2: 22 mEq/L (ref 19–32)
Chloride: 99 mEq/L (ref 96–112)
Creatinine, Ser: 0.62 mg/dL (ref 0.50–1.10)
GFR calc non Af Amer: 88 mL/min — ABNORMAL LOW (ref >90)
Glucose, Bld: 116 mg/dL — ABNORMAL HIGH (ref 70–99)
Potassium: 4.3 mEq/L (ref 3.7–5.3)
Sodium: 132 mEq/L — ABNORMAL LOW (ref 137–147)

## 2013-04-11 LAB — CBC
HCT: 32.7 % — ABNORMAL LOW (ref 36.0–46.0)
Hemoglobin: 10.6 g/dL — ABNORMAL LOW (ref 12.0–15.0)
MCH: 30.5 pg (ref 26.0–34.0)
MCHC: 32.4 g/dL (ref 30.0–36.0)
MCV: 94.2 fL (ref 78.0–100.0)
PLATELETS: 176 10*3/uL (ref 150–400)
RBC: 3.47 MIL/uL — ABNORMAL LOW (ref 3.87–5.11)
RDW: 13.6 % (ref 11.5–15.5)
WBC: 8.3 10*3/uL (ref 4.0–10.5)

## 2013-04-11 MED ORDER — TRAMADOL HCL 50 MG PO TABS
50.0000 mg | ORAL_TABLET | Freq: Four times a day (QID) | ORAL | Status: DC | PRN
  Administered 2013-04-11: 50 mg via ORAL
  Filled 2013-04-11: qty 1

## 2013-04-11 MED ORDER — POLYETHYLENE GLYCOL 3350 17 G PO PACK: 17.0000 g | PACK | Freq: Two times a day (BID) | ORAL | Status: DC

## 2013-04-11 MED ORDER — TRAMADOL HCL 50 MG PO TABS: 50.0000 mg | ORAL_TABLET | Freq: Four times a day (QID) | ORAL | Status: DC | PRN

## 2013-04-11 MED ORDER — TIZANIDINE HCL 4 MG PO TABS: 4.0000 mg | ORAL_TABLET | Freq: Four times a day (QID) | ORAL | Status: DC | PRN

## 2013-04-11 MED ORDER — FERROUS SULFATE 325 (65 FE) MG PO TABS: 325.0000 mg | ORAL_TABLET | Freq: Three times a day (TID) | ORAL | Status: DC

## 2013-04-11 MED ORDER — ONDANSETRON 4 MG PO TBDP: 4.0000 mg | ORAL_TABLET | Freq: Three times a day (TID) | ORAL | Status: DC | PRN

## 2013-04-11 MED ORDER — DSS 100 MG PO CAPS: 100.0000 mg | ORAL_CAPSULE | Freq: Two times a day (BID) | ORAL | Status: DC

## 2013-04-11 MED ORDER — HYDROCODONE-ACETAMINOPHEN 7.5-325 MG PO TABS: 1.0000 | ORAL_TABLET | ORAL | Status: DC | PRN

## 2013-04-11 MED ORDER — ASPIRIN 325 MG PO TBEC: 325.0000 mg | DELAYED_RELEASE_TABLET | Freq: Two times a day (BID) | ORAL | Status: AC

## 2013-04-11 NOTE — Care Management Note (Signed)
    Page 1 of 1   04/11/2013     11:57:40 AM   CARE MANAGEMENT NOTE 04/11/2013  Patient:  Felicia Hunter, Felicia Hunter   Account Number:  [id number]  Date Initiated:  04/11/2013  Documentation initiated by:  Sherrin Daisy  Subjective/Objective Assessment:   dx rt hip replacement-anterior approach    Referral from doctor's office to [facility] for [facility] services which will staqrt day after pt is discharged.     Action/Plan:   CM spoke with patient. Plans are for patient to return to home in [place] where daughter &  grand daughter will be caregivers. She already has RW and commode.   Anticipated DC Date:  04/11/2013   Anticipated DC Plan:  [place]  CM consult      [facility] Choice  HOME HEALTH   Choice offered to / List presented to:  C-1 Patient        [place] arranged  HH-2 PT      [place]   Status of service:  Completed, signed off Medicare Important Message given?  NA - LOS <3 / Initial given by admissions (If response is "NO", the following Medicare IM given date fields will be blank) Date Medicare IM given:   Date Additional Medicare IM given:    Discharge Disposition:  [place]  Per UR Regulation:    If discussed at Long Length of Stay Meetings, dates discussed:    Comments:

## 2013-04-11 NOTE — Progress Notes (Signed)
Physical Therapy Treatment Patient Details Name: Felicia Hunter MRN: [id number] DOB: January 28, 1941 Today's Date: 04/11/2013 Time: 1157-2620 PT Time Calculation (min): 25 min  PT Assessment / Plan / Recommendation  History of Present Illness R direct anterior approach THA   PT Comments   *PT Goals met, pt ambulated 200', stair training completed, progressing well with exercises. Ready to DC home from PT standpoint. Pt reports some nausea, RN aware.**  Follow Up Recommendations  Home health PT     Does the patient have the potential to tolerate intense rehabilitation     Barriers to Discharge        Equipment Recommendations  None recommended by PT    Recommendations for Other Services    Frequency 7X/week   Progress towards PT Goals Progress towards PT goals: Progressing toward goals  Plan Current plan remains appropriate    Precautions / Restrictions Precautions Precautions: None Precaution Comments: direct anterior approach , no precautions Restrictions Weight Bearing Restrictions: No (WBAT RLE)   Pertinent Vitals/Pain **3/10 R quads Premedicated, ice applied *    Mobility  Bed Mobility Overal bed mobility: Independent Bed Mobility: Supine to Sit Supine to sit: Independent Transfers Overall transfer level: Modified independent Equipment used: Rolling walker (2 wheeled) Transfers: Sit to/from Stand Sit to Stand: Modified independent (Device/Increase time) General transfer comment: With UE assist Ambulation/Gait Ambulation/Gait assistance: Modified independent (Device/Increase time) Ambulation Distance (Feet): 200 Feet Assistive device: Rolling walker (2 wheeled) Gait Pattern/deviations: Step-through pattern Gait velocity: WFL General Gait Details: steady, no LOB Stairs: Yes Stairs assistance: Min assist Stair Management: No rails;Step to pattern;Backwards Number of Stairs: 2    Exercises Total Joint Exercises Ankle Circles/Pumps: AROM;Both Quad Sets:  AROM;Both;10 reps Short Arc Quad: AROM;Right;15 reps Heel Slides: AAROM;Right;Supine;15 reps Hip ABduction/ADduction: AROM;Right;10 reps;Standing Long Arc Quad: AROM;Right;10 reps;Seated Marching in Standing: AROM;Right;10 reps Standing Hip Extension: AROM;Right;10 reps;Standing   PT Diagnosis:    PT Problem List:   PT Treatment Interventions:     PT Goals (current goals can now be found in the care plan section) Acute Rehab PT Goals Patient Stated Goal: return to water aerobics PT Goal Formulation: With patient Time For Goal Achievement: 04/13/13 Potential to Achieve Goals: Good  Visit Information  Last PT Received On: 04/11/13 Assistance Needed: +1 History of Present Illness: R direct anterior approach THA    Subjective Data  Patient Stated Goal: return to water aerobics   Cognition  Cognition Arousal/Alertness: Awake/alert Behavior During Therapy: WFL for tasks assessed/performed Overall Cognitive Status: Within Functional Limits for tasks assessed    Balance     End of Session PT - End of Session Equipment Utilized During Treatment: Gait belt Activity Tolerance: Patient tolerated treatment well Patient left: in chair;with call bell/phone within reach Nurse Communication: Mobility status   GP     Felicia Hunter 04/11/2013, 9:18 AM [phone]

## 2013-04-11 NOTE — Progress Notes (Signed)
   Subjective: 1 Day Post-Op Procedure(s) (LRB): RIGHT TOTAL HIP ARTHROPLASTY ANTERIOR APPROACH (Right)   Patient reports pain as mild, pain really well controlled. C/O some nausea (no vomiting). Otherwise no events throughout the night. Patient ready to be discharged home if she does well with PT and nausea is controlled.  Objective:   VITALS:   Filed Vitals:   04/11/13 0500  BP: 92/58  Pulse: 61  Temp: 97.7 F (36.5 C)  Resp: 16    Neurovascular intact Dorsiflexion/Plantar flexion intact Incision: dressing C/D/I No cellulitis present Compartment soft  LABS  Recent Labs  04/11/13 0403  HGB 10.6*  HCT 32.7*  WBC 8.3  PLT 176     Recent Labs  04/11/13 0403  NA 132*  K 4.3  BUN 12  CREATININE 0.62  GLUCOSE 116*     Assessment/Plan: 1 Day Post-Op Procedure(s) (LRB): RIGHT TOTAL HIP ARTHROPLASTY ANTERIOR APPROACH (Right) Will add tramadol / Norco as needed Give Rx for Zofran for home Advance diet Up with therapy D/C IV fluids Discharge home with home health Follow up in 2 weeks at [facility]. Follow up with OLIN,Nilo Fallin D in 2 weeks.  Contact information:  [facility] [address] [place] [phone]    Expected ABLA  Treated with iron and will observe  Hyponatremia Treated with IV fluids and will observe       [place] P[place]. Rusty Glodowski   PAC  04/11/2013, 10:20 AM

## 2013-04-11 NOTE — Progress Notes (Signed)
OT Cancellation Note  Patient Details Name: Felicia Hunter MRN: [id number] DOB: Jan 26, 1941   Cancelled Treatment:    Reason Eval/Treat Not Completed: OT screened, no needs identified, will sign off. Pt states she has all DME and declines needing OT. Will sign off per pt request.  Jules Schick [phone] 04/11/2013, 11:39 AM

## 2013-04-13 DIAGNOSIS — F3289 Other specified depressive episodes: Secondary | ICD-10-CM | POA: Diagnosis not present

## 2013-04-13 DIAGNOSIS — IMO0001 Reserved for inherently not codable concepts without codable children: Secondary | ICD-10-CM | POA: Diagnosis not present

## 2013-04-13 DIAGNOSIS — Z471 Aftercare following joint replacement surgery: Secondary | ICD-10-CM | POA: Diagnosis not present

## 2013-04-13 DIAGNOSIS — F329 Major depressive disorder, single episode, unspecified: Secondary | ICD-10-CM | POA: Diagnosis not present

## 2013-04-13 DIAGNOSIS — J45909 Unspecified asthma, uncomplicated: Secondary | ICD-10-CM | POA: Diagnosis not present

## 2013-04-13 DIAGNOSIS — Z96649 Presence of unspecified artificial hip joint: Secondary | ICD-10-CM | POA: Diagnosis not present

## 2013-04-15 NOTE — Discharge Summary (Signed)
Physician Discharge Summary  Patient ID: Felicia Hunter MRN: [id number] DOB/AGE: 10/06/1940 73 y.o.  Admit date: 04/10/2013 Discharge date: 04/11/2013   Procedures:  Procedure(s) (LRB): RIGHT TOTAL HIP ARTHROPLASTY ANTERIOR APPROACH (Right)  Attending Physician:  Dr. Paralee Cancel   Admission Diagnoses:   Right hip OA / pain  Discharge Diagnoses:  Principal Problem:   S/P right THA, AA Active Problems:   Expected blood loss anemia   Hyponatremia  Past Medical History  Diagnosis Date  . GERD (gastroesophageal reflux disease)   . Arthritis   . Cough     PERSISTANT COUGH X 20 YRS  . Tubulovillous adenoma of colon 08/2003  . Diverticulosis   . Hyperlipidemia   . Osteopenia   . Alopecia   . MVP (mitral valve prolapse)   . Clavicle fracture   . Disorders of bilirubin excretion   . Stroke 06/07/11    NO RESIDUAL PROBLEMS  . Difficulty sleeping     takes Ambien  . Breast cancer     HPI: Felicia Hunter, 73 y.o. female, has a history of pain and functional disability in the right hip(s) due to arthritis and patient has failed non-surgical conservative treatments for greater than 12 weeks to include NSAID's and/or analgesics, corticosteriod injections and activity modification. Onset of symptoms was gradual starting 8+ years ago with gradually worsening course since that time.The patient noted no past surgery on the right hip(s). Patient currently rates pain in the right hip at 8 out of 10 with activity. Patient has worsening of pain with activity and weight bearing, trendelenberg gait, pain that interfers with activities of daily living and pain with passive range of motion. Patient has evidence of periarticular osteophytes and joint space narrowing by imaging studies. This condition presents safety issues increasing the risk of falls. There is no current active infection. Risks, benefits and expectations were discussed with the patient. Risks including but not limited to the risk of  anesthesia, blood clots, nerve damage, blood vessel damage, failure of the prosthesis, infection and up to and including death. Patient understand the risks, benefits and expectations and wishes to proceed with surgery.   PCP: Horatio Pel, MD   Discharged Condition: good  Hospital Course:  Patient underwent the above stated procedure on 04/10/2013. Patient tolerated the procedure well and brought to the recovery room in good condition and subsequently to the floor.  POD #1 BP: 92/58 ; Pulse: 61 ; Temp: 97.7 F (36.5 C) ; Resp: 16 Patient reports pain as mild, pain really well controlled. C/O some nausea (no vomiting). Otherwise no events throughout the night. Patient ready to be discharged home. Neurovascular intact, dorsiflexion/plantar flexion intact, incision: dressing C/D/I, no cellulitis present and compartment soft.   LABS  Basename    HGB  10.6  HCT  32.7    Discharge Exam: General appearance: alert, cooperative and no distress Extremities: Homans sign is negative, no sign of DVT, no edema, redness or tenderness in the calves or thighs and no ulcers, gangrene or trophic changes  Disposition:     Home-Health Care Svc with follow up in 2 weeks   Follow-up Information   Follow up with [name], MD. Schedule an appointment as soon as possible for a visit in 2 weeks.   Specialty:  Orthopedic Surgery   Contact information:   [address] [place] [address] [place] [address] [phone]       Discharge Orders   Future Appointments Provider Department Dept Phone   10/25/2013  3:00 PM [place] [phone]   10/25/2013 3:30 PM Volanda Napoleon, MD [place] [phone]   Future Orders Complete By Expires   Call MD / Call 911  As directed    Comments:     If you experience chest pain or shortness of breath, CALL 911 and be transported to the hospital emergency room.  If you  develope a fever above 101 F, pus (white drainage) or increased drainage or redness at the wound, or calf pain, call your surgeon's office.   Change dressing  As directed    Comments:     Maintain surgical dressing for 10-14 days, or until follow up in the clinic.   Constipation Prevention  As directed    Comments:     Drink plenty of fluids.  Prune juice may be helpful.  You may use a stool softener, such as Colace (over the counter) 100 mg twice a day.  Use MiraLax (over the counter) for constipation as needed.   Diet - low sodium heart healthy  As directed    Discharge instructions  As directed    Comments:     Maintain surgical dressing for 10-14 days, or until follow up in the clinic. [facility] Call with any questions or concerns.   Increase activity slowly as tolerated  As directed    TED hose  As directed    Comments:     Use stockings (TED hose) for 2 weeks on both leg(s).  You may remove them at night for sleeping.   Weight bearing as tolerated  As directed    Questions:     Laterality:     Extremity:          Medication List    STOP taking these medications       aspirin 325 MG tablet  Replaced by:  aspirin 325 MG EC tablet     diphenhydramine-acetaminophen 25-500 MG Tabs  Commonly known as:  TYLENOL PM      TAKE these medications       aspirin 325 MG EC tablet  Take 1 tablet (325 mg total) by mouth 2 (two) times daily.     atorvastatin 20 MG tablet  Commonly known as:  LIPITOR  Take 20 mg by mouth every evening.     BIOTIN PO  Take 1 tablet by mouth daily.     CALCIUM 600 600 MG Tabs tablet  Generic drug:  calcium carbonate  Take 600 mg by mouth daily.     cholecalciferol 1000 UNITS tablet  Commonly known as:  VITAMIN D  Take 1,000 Units by mouth daily.     DSS 100 MG Caps  Take 100 mg by mouth 2 (two) times daily.     ferrous sulfate 325 (65 FE) MG tablet  Take 1 tablet (325 mg total) by mouth 3 (three) times  daily after meals.     glucosamine-chondroitin 500-400 MG tablet  Take 1 tablet by mouth 2 (two) times daily.     HYDROcodone-acetaminophen 7.5-325 MG per tablet  Commonly known as:  NORCO  Take 1-2 tablets by mouth every 4 (four) hours as needed for moderate pain.     omega-3 acid ethyl esters 1 G capsule  Commonly known as:  LOVAZA  Take 1 g by mouth daily.     ondansetron 4 MG disintegrating tablet  Commonly known as:  ZOFRAN ODT  Take 1 tablet (4 mg total) by mouth every 8 (eight) hours as needed for nausea or vomiting.     polyethylene glycol packet  Commonly known as:  MIRALAX / GLYCOLAX  Take 17 g by mouth 2 (two) times daily.     tiZANidine 4 MG tablet  Commonly known as:  ZANAFLEX  Take 1 tablet (4 mg total) by mouth every 6 (six) hours as needed for muscle spasms.     traMADol 50 MG tablet  Commonly known as:  ULTRAM  Take 1-2 tablets (50-100 mg total) by mouth every 6 (six) hours as needed for moderate pain or severe pain.     zolpidem 10 MG tablet  Commonly known as:  AMBIEN  Take 10 mg by mouth at bedtime as needed. sleep         Signed: West Pugh. Rawlin Reaume   PAC  04/15/2013, 4:01 PM

## 2013-04-16 DIAGNOSIS — F3289 Other specified depressive episodes: Secondary | ICD-10-CM | POA: Diagnosis not present

## 2013-04-16 DIAGNOSIS — Z96649 Presence of unspecified artificial hip joint: Secondary | ICD-10-CM | POA: Diagnosis not present

## 2013-04-16 DIAGNOSIS — F329 Major depressive disorder, single episode, unspecified: Secondary | ICD-10-CM | POA: Diagnosis not present

## 2013-04-16 DIAGNOSIS — J45909 Unspecified asthma, uncomplicated: Secondary | ICD-10-CM | POA: Diagnosis not present

## 2013-04-16 DIAGNOSIS — Z471 Aftercare following joint replacement surgery: Secondary | ICD-10-CM | POA: Diagnosis not present

## 2013-04-16 DIAGNOSIS — IMO0001 Reserved for inherently not codable concepts without codable children: Secondary | ICD-10-CM | POA: Diagnosis not present

## 2013-04-17 DIAGNOSIS — F329 Major depressive disorder, single episode, unspecified: Secondary | ICD-10-CM | POA: Diagnosis not present

## 2013-04-17 DIAGNOSIS — IMO0001 Reserved for inherently not codable concepts without codable children: Secondary | ICD-10-CM | POA: Diagnosis not present

## 2013-04-17 DIAGNOSIS — Z96649 Presence of unspecified artificial hip joint: Secondary | ICD-10-CM | POA: Diagnosis not present

## 2013-04-17 DIAGNOSIS — F3289 Other specified depressive episodes: Secondary | ICD-10-CM | POA: Diagnosis not present

## 2013-04-17 DIAGNOSIS — Z471 Aftercare following joint replacement surgery: Secondary | ICD-10-CM | POA: Diagnosis not present

## 2013-04-17 DIAGNOSIS — J45909 Unspecified asthma, uncomplicated: Secondary | ICD-10-CM | POA: Diagnosis not present

## 2013-04-19 DIAGNOSIS — Z96649 Presence of unspecified artificial hip joint: Secondary | ICD-10-CM | POA: Diagnosis not present

## 2013-04-19 DIAGNOSIS — J45909 Unspecified asthma, uncomplicated: Secondary | ICD-10-CM | POA: Diagnosis not present

## 2013-04-19 DIAGNOSIS — IMO0001 Reserved for inherently not codable concepts without codable children: Secondary | ICD-10-CM | POA: Diagnosis not present

## 2013-04-19 DIAGNOSIS — F3289 Other specified depressive episodes: Secondary | ICD-10-CM | POA: Diagnosis not present

## 2013-04-19 DIAGNOSIS — F329 Major depressive disorder, single episode, unspecified: Secondary | ICD-10-CM | POA: Diagnosis not present

## 2013-04-19 DIAGNOSIS — Z471 Aftercare following joint replacement surgery: Secondary | ICD-10-CM | POA: Diagnosis not present

## 2013-04-23 DIAGNOSIS — F329 Major depressive disorder, single episode, unspecified: Secondary | ICD-10-CM | POA: Diagnosis not present

## 2013-04-23 DIAGNOSIS — Z471 Aftercare following joint replacement surgery: Secondary | ICD-10-CM | POA: Diagnosis not present

## 2013-04-23 DIAGNOSIS — IMO0001 Reserved for inherently not codable concepts without codable children: Secondary | ICD-10-CM | POA: Diagnosis not present

## 2013-04-23 DIAGNOSIS — Z96649 Presence of unspecified artificial hip joint: Secondary | ICD-10-CM | POA: Diagnosis not present

## 2013-04-23 DIAGNOSIS — F3289 Other specified depressive episodes: Secondary | ICD-10-CM | POA: Diagnosis not present

## 2013-04-23 DIAGNOSIS — J45909 Unspecified asthma, uncomplicated: Secondary | ICD-10-CM | POA: Diagnosis not present

## 2013-04-26 DIAGNOSIS — F3289 Other specified depressive episodes: Secondary | ICD-10-CM | POA: Diagnosis not present

## 2013-04-26 DIAGNOSIS — Z96649 Presence of unspecified artificial hip joint: Secondary | ICD-10-CM | POA: Diagnosis not present

## 2013-04-26 DIAGNOSIS — IMO0001 Reserved for inherently not codable concepts without codable children: Secondary | ICD-10-CM | POA: Diagnosis not present

## 2013-04-26 DIAGNOSIS — J45909 Unspecified asthma, uncomplicated: Secondary | ICD-10-CM | POA: Diagnosis not present

## 2013-04-26 DIAGNOSIS — F329 Major depressive disorder, single episode, unspecified: Secondary | ICD-10-CM | POA: Diagnosis not present

## 2013-04-26 DIAGNOSIS — Z471 Aftercare following joint replacement surgery: Secondary | ICD-10-CM | POA: Diagnosis not present

## 2013-05-25 DIAGNOSIS — M1610 Unilateral primary osteoarthritis, unspecified hip: Secondary | ICD-10-CM | POA: Diagnosis not present

## 2013-05-25 DIAGNOSIS — M169 Osteoarthritis of hip, unspecified: Secondary | ICD-10-CM | POA: Diagnosis not present

## 2013-10-09 ENCOUNTER — Encounter: Payer: Self-pay | Admitting: Gastroenterology

## 2013-10-25 ENCOUNTER — Ambulatory Visit (HOSPITAL_BASED_OUTPATIENT_CLINIC_OR_DEPARTMENT_OTHER): Payer: Medicare Other | Admitting: Family

## 2013-10-25 ENCOUNTER — Encounter: Payer: Self-pay | Admitting: Family

## 2013-10-25 ENCOUNTER — Other Ambulatory Visit (HOSPITAL_BASED_OUTPATIENT_CLINIC_OR_DEPARTMENT_OTHER): Payer: Medicare Other | Admitting: Lab

## 2013-10-25 VITALS — BP 139/81 | HR 54 | Temp 97.0°F | Resp 14 | Ht 65.0 in | Wt 140.0 lb

## 2013-10-25 DIAGNOSIS — Z853 Personal history of malignant neoplasm of breast: Secondary | ICD-10-CM | POA: Diagnosis not present

## 2013-10-25 DIAGNOSIS — C50119 Malignant neoplasm of central portion of unspecified female breast: Secondary | ICD-10-CM | POA: Diagnosis not present

## 2013-10-25 LAB — CBC WITH DIFFERENTIAL (CANCER CENTER ONLY)
BASO#: 0 10*3/uL (ref 0.0–0.2)
BASO%: 0.4 % (ref 0.0–2.0)
EOS ABS: 0.2 10*3/uL (ref 0.0–0.5)
EOS%: 3 % (ref 0.0–7.0)
HCT: 43.5 % (ref 34.8–46.6)
HGB: 14.2 g/dL (ref 11.6–15.9)
LYMPH#: 1.7 10*3/uL (ref 0.9–3.3)
LYMPH%: 32 % (ref 14.0–48.0)
MCH: 31.6 pg (ref 26.0–34.0)
MCHC: 32.6 g/dL (ref 32.0–36.0)
MCV: 97 fL (ref 81–101)
MONO#: 0.4 10*3/uL (ref 0.1–0.9)
MONO%: 7.8 % (ref 0.0–13.0)
NEUT#: 3.1 10*3/uL (ref 1.5–6.5)
NEUT%: 56.8 % (ref 39.6–80.0)
Platelets: 214 10*3/uL (ref 145–400)
RBC: 4.49 10*6/uL (ref 3.70–5.32)
RDW: 13.7 % (ref 11.1–15.7)
WBC: 5.4 10*3/uL (ref 3.9–10.0)

## 2013-10-25 LAB — COMPREHENSIVE METABOLIC PANEL
ALBUMIN: 4.5 g/dL (ref 3.5–5.2)
ALT: 18 U/L (ref 0–35)
AST: 20 U/L (ref 0–37)
Alkaline Phosphatase: 61 U/L (ref 39–117)
BUN: 16 mg/dL (ref 6–23)
CALCIUM: 9.4 mg/dL (ref 8.4–10.5)
CHLORIDE: 104 meq/L (ref 96–112)
CO2: 25 meq/L (ref 19–32)
Creatinine, Ser: 0.81 mg/dL (ref 0.50–1.10)
GLUCOSE: 81 mg/dL (ref 70–99)
Potassium: 4.1 mEq/L (ref 3.5–5.3)
SODIUM: 139 meq/L (ref 135–145)
TOTAL PROTEIN: 6.7 g/dL (ref 6.0–8.3)
Total Bilirubin: 1.2 mg/dL (ref 0.2–1.2)

## 2013-10-25 NOTE — Progress Notes (Signed)
[name]  [phone]) [phone] [phone]) [phone]  ID: Felicia Hunter OB: 1941-02-27 MR#: [id number]#:[id number] Patient Care Team: Horatio Pel, MD as PCP - General (Internal Medicine)  DIAGNOSIS: Ductal carcinoma in situ of the right breast  INTERVAL HISTORY: Felicia Hunter is here today for her yearly follow-up. She is doing well since we last her. She had a hip replacement in January and this has helped her tremendously. Her energy level is great. She has had no changes in her chest. No rashes or lymphedema. She does have some occasional join pain but this is tolerable. She has had no bleeding.  Her appetite is good. She has gained 5 lbs. She denies fever, chills, n/v, cough, headaches, dizziness, SOB, chest pain, palpitations, abdominal pain, constipation, diarrhea, blood in urine or stool. She denies swelling, tenderness, numbness or tingling in her extremities. There have been no issues with respect to a CVA. She did have a ganglion cyst removed from the left neck/shoulder. This was found to be benign. She went on an [place] cruise with her family in July and had a wonderful time. She does have vitiligo. She watches herself in the sun. Overall, she is doing very well.   CURRENT TREATMENT: Observation  REVIEW OF SYSTEMS: All other 10 point review of systems is negative.   PAST MEDICAL HISTORY: Past Medical History  Diagnosis Date  . GERD (gastroesophageal reflux disease)   . Arthritis   . Cough     PERSISTANT COUGH X 20 YRS  . Tubulovillous adenoma of colon 08/2003  . Diverticulosis   . Hyperlipidemia   . Osteopenia   . Alopecia   . MVP (mitral valve prolapse)   . Clavicle fracture   . Disorders of bilirubin excretion   . Stroke 06/07/11    NO RESIDUAL PROBLEMS  . Difficulty sleeping     takes Ambien  . Breast cancer    PAST SURGICAL HISTORY: Past Surgical History  Procedure Laterality Date  . Collarbone  september 2011    pinned and plated,  right  . Abdominal hysterectomy  1993  . Vein surgery  1974    left leg  . Nasal septum surgery    . Tonsillectomy    . Joint replacement  july 9th, 2012    left knee replacement   . Knee reconstruction  7/12    revision lt total knee  . Breast enhancement surgery      x3-then removed 2011 for bilat mastectomies  . Chest wall tumor excision  5/12    noduals removed-neg  . Breast capsulotomy      after bilat mastectomies-implants removed  . Breast surgery  june 2011    double mastectomy nodes from rt  . Mass excision Left 06/19/2012    Procedure: EXCISION OF LARGE MASS LEFT NECK/SHOULDER WITH PLASTIC CLOSURE;  Surgeon: Cristine Polio, MD;  Location: [facility];  Service: Plastics;  Laterality: Left;  . Total hip arthroplasty Right 04/10/2013    Procedure: RIGHT TOTAL HIP ARTHROPLASTY ANTERIOR APPROACH;  Surgeon: [name], MD;  Location: [facility];  Service: Orthopedics;  Laterality: Right;   FAMILY HISTORY Family History  Problem Relation Age of Onset  . Aneurysm Father    GYNECOLOGIC HISTORY:  No LMP recorded. Patient is postmenopausal.   SOCIAL HISTORY:  History   Social History  . Marital Status: Widowed    Spouse Name: N/A    Number of Children: 2  . Years of Education: N/A  Occupational History  . retired    Social History Main Topics  . Smoking status: Never Smoker   . Smokeless tobacco: Never Used  . Alcohol Use: 0.6 oz/week    1 Glasses of wine per week     Comment: 1 glass of wine per night  . Drug Use: No  . Sexual Activity: Not on file   Other Topics Concern  . Not on file   Social History Narrative  . No narrative on file   ADVANCED DIRECTIVES: <no information>  HEALTH MAINTENANCE: History  Substance Use Topics  . Smoking status: Never Smoker   . Smokeless tobacco: Never Used  . Alcohol Use: 0.6 oz/week    1 Glasses of wine per week     Comment: 1 glass of wine per night   Colonoscopy: PAP: Bone density: Lipid  panel:  Allergies  Allergen Reactions  . Sulfonamide Derivatives Rash   Current Outpatient Prescriptions  Medication Sig Dispense Refill  . atorvastatin (LIPITOR) 20 MG tablet Take 20 mg by mouth every evening.       Marland Kitchen BIOTIN PO Take 1 tablet by mouth daily.      . calcium carbonate (CALCIUM 600) 600 MG TABS Take 600 mg by mouth daily.      . cholecalciferol (VITAMIN D) 1000 UNITS tablet Take 1,000 Units by mouth daily.      Marland Kitchen glucosamine-chondroitin 500-400 MG tablet Take 1 tablet by mouth 2 (two) times daily.      . Multiple Vitamins-Minerals (MULTIVITAMIN & MINERAL PO) Take by mouth every morning.      Marland Kitchen omega-3 acid ethyl esters (LOVAZA) 1 G capsule Take 1 g by mouth daily.      Marland Kitchen zolpidem (AMBIEN) 10 MG tablet Take 10 mg by mouth at bedtime as needed. sleep      . [DISCONTINUED] CALCIUM PO Take by mouth 2 (two) times daily.        No current facility-administered medications for this visit.   OBJECTIVE: Filed Vitals:   10/25/13 1523  BP: 139/81  Pulse: 54  Temp: 97 F (36.1 C)  Resp: 14   Body mass index is 23.3 kg/(m^2). ECOG FS:0 - Asymptomatic Ocular: Sclerae unicteric, pupils equal, round and reactive to light Ear-nose-throat: Oropharynx clear, dentition fair Lymphatic: No cervical or supraclavicular adenopathy Lungs no rales or rhonchi, good excursion bilaterally Heart regular rate and rhythm, no murmur appreciated Abd soft, nontender, positive bowel sounds MSK no focal spinal tenderness, no joint edema Neuro: non-focal, well-oriented, appropriate affect Breasts: Pt had bilateral mastectomies. Breast area smooth, scarring symmetrical, no lumps or rash. No lymphedema.   LAB RESULTS: CMP     Component Value Date/Time   NA 132* 04/11/2013 0403   K 4.3 04/11/2013 0403   CL 99 04/11/2013 0403   CO2 22 04/11/2013 0403   GLUCOSE 116* 04/11/2013 0403   BUN 12 04/11/2013 0403   CREATININE 0.62 04/11/2013 0403   CALCIUM 8.4 04/11/2013 0403   PROT 6.7 10/26/2012 1428    ALBUMIN 4.4 10/26/2012 1428   AST 18 10/26/2012 1428   ALT 17 10/26/2012 1428   ALKPHOS 58 10/26/2012 1428   BILITOT 1.1 10/26/2012 1428   GFRNONAA 88* 04/11/2013 0403   GFRAA >90 04/11/2013 0403   No results found for this basename: SPEP, UPEP,  kappa and lambda light chains   Lab Results  Component Value Date   WBC 5.4 10/25/2013   NEUTROABS 3.1 10/25/2013   HGB 14.2 10/25/2013   HCT 43.5 10/25/2013  MCV 97 10/25/2013   PLT 214 10/25/2013   No results found for this basename: LABCA2   No components found with this basename: [id number]   No results found for this basename: INR,  in the last 168 hours  STUDIES: No results found.  ASSESSMENT/PLAN: Felicia Hunter is a pleasant 74 year old white female with history of ductal carcinoma in situ of the right breast. She was diagnosed in June 2011. She is doing very well. She is not on any type of antiestrogen for this.  We will see her back in 1 year for labs and follow-up. She is in agreement with this and knows to call here with any questions or concerns. We can certainly see her back sooner if need be.   Eliezer Bottom, NP 10/25/2013 4:02 PM

## 2013-11-05 ENCOUNTER — Inpatient Hospital Stay (HOSPITAL_COMMUNITY)
Admission: EM | Admit: 2013-11-05 | Discharge: 2013-11-07 | DRG: 042 | Disposition: A | Discharge status: Home / Self Care | Payer: Medicare Other | Attending: Internal Medicine | Admitting: Internal Medicine

## 2013-11-05 ENCOUNTER — Emergency Department (HOSPITAL_COMMUNITY): Payer: Medicare Other

## 2013-11-05 ENCOUNTER — Encounter (HOSPITAL_COMMUNITY): Payer: Self-pay | Admitting: Emergency Medicine

## 2013-11-05 ENCOUNTER — Inpatient Hospital Stay (HOSPITAL_COMMUNITY): Payer: Medicare Other

## 2013-11-05 DIAGNOSIS — E785 Hyperlipidemia, unspecified: Secondary | ICD-10-CM | POA: Diagnosis present

## 2013-11-05 DIAGNOSIS — Z853 Personal history of malignant neoplasm of breast: Secondary | ICD-10-CM | POA: Diagnosis not present

## 2013-11-05 DIAGNOSIS — Z882 Allergy status to sulfonamides status: Secondary | ICD-10-CM

## 2013-11-05 DIAGNOSIS — F3289 Other specified depressive episodes: Secondary | ICD-10-CM

## 2013-11-05 DIAGNOSIS — R4701 Aphasia: Secondary | ICD-10-CM

## 2013-11-05 DIAGNOSIS — I059 Rheumatic mitral valve disease, unspecified: Secondary | ICD-10-CM | POA: Diagnosis present

## 2013-11-05 DIAGNOSIS — I6340 Cerebral infarction due to embolism of unspecified cerebral artery: Principal | ICD-10-CM | POA: Diagnosis present

## 2013-11-05 DIAGNOSIS — I6350 Cerebral infarction due to unspecified occlusion or stenosis of unspecified cerebral artery: Secondary | ICD-10-CM | POA: Diagnosis not present

## 2013-11-05 DIAGNOSIS — Z96659 Presence of unspecified artificial knee joint: Secondary | ICD-10-CM

## 2013-11-05 DIAGNOSIS — I517 Cardiomegaly: Secondary | ICD-10-CM | POA: Diagnosis not present

## 2013-11-05 DIAGNOSIS — Z7982 Long term (current) use of aspirin: Secondary | ICD-10-CM | POA: Diagnosis not present

## 2013-11-05 DIAGNOSIS — Z96649 Presence of unspecified artificial hip joint: Secondary | ICD-10-CM | POA: Diagnosis not present

## 2013-11-05 DIAGNOSIS — F329 Major depressive disorder, single episode, unspecified: Secondary | ICD-10-CM

## 2013-11-05 DIAGNOSIS — E7800 Pure hypercholesterolemia, unspecified: Secondary | ICD-10-CM | POA: Diagnosis present

## 2013-11-05 DIAGNOSIS — M949 Disorder of cartilage, unspecified: Secondary | ICD-10-CM

## 2013-11-05 DIAGNOSIS — D62 Acute posthemorrhagic anemia: Secondary | ICD-10-CM | POA: Diagnosis not present

## 2013-11-05 DIAGNOSIS — M899 Disorder of bone, unspecified: Secondary | ICD-10-CM | POA: Diagnosis present

## 2013-11-05 DIAGNOSIS — I639 Cerebral infarction, unspecified: Secondary | ICD-10-CM | POA: Diagnosis present

## 2013-11-05 DIAGNOSIS — J45909 Unspecified asthma, uncomplicated: Secondary | ICD-10-CM | POA: Diagnosis present

## 2013-11-05 LAB — COMPREHENSIVE METABOLIC PANEL
ALT: 19 U/L (ref 0–35)
AST: 22 U/L (ref 0–37)
Albumin: 4 g/dL (ref 3.5–5.2)
Alkaline Phosphatase: 70 U/L (ref 39–117)
Anion gap: 14 (ref 5–15)
BUN: 12 mg/dL (ref 6–23)
CALCIUM: 10.2 mg/dL (ref 8.4–10.5)
CO2: 22 meq/L (ref 19–32)
Chloride: 103 mEq/L (ref 96–112)
Creatinine, Ser: 0.73 mg/dL (ref 0.50–1.10)
GFR calc non Af Amer: 83 mL/min — ABNORMAL LOW (ref >90)
Glucose, Bld: 114 mg/dL — ABNORMAL HIGH (ref 70–99)
Potassium: 4.1 mEq/L (ref 3.7–5.3)
Sodium: 139 mEq/L (ref 137–147)
Total Bilirubin: 1 mg/dL (ref 0.3–1.2)
Total Protein: 7.1 g/dL (ref 6.0–8.3)

## 2013-11-05 LAB — I-STAT TROPONIN, ED: TROPONIN I, POC: 0.01 ng/mL (ref 0.00–0.08)

## 2013-11-05 LAB — DIFFERENTIAL
BASOS ABS: 0 10*3/uL (ref 0.0–0.1)
Basophils Relative: 0 % (ref 0–1)
EOS ABS: 0 10*3/uL (ref 0.0–0.7)
EOS PCT: 1 % (ref 0–5)
Lymphocytes Relative: 18 % (ref 12–46)
Lymphs Abs: 1 10*3/uL (ref 0.7–4.0)
Monocytes Absolute: 0.5 10*3/uL (ref 0.1–1.0)
Monocytes Relative: 8 % (ref 3–12)
Neutro Abs: 4.2 10*3/uL (ref 1.7–7.7)
Neutrophils Relative %: 73 % (ref 43–77)

## 2013-11-05 LAB — CBG MONITORING, ED: GLUCOSE-CAPILLARY: 133 mg/dL — AB (ref 70–99)

## 2013-11-05 LAB — CBC
HEMATOCRIT: 43.7 % (ref 36.0–46.0)
Hemoglobin: 14.4 g/dL (ref 12.0–15.0)
MCH: 31.3 pg (ref 26.0–34.0)
MCHC: 33 g/dL (ref 30.0–36.0)
MCV: 95 fL (ref 78.0–100.0)
PLATELETS: 234 10*3/uL (ref 150–400)
RBC: 4.6 MIL/uL (ref 3.87–5.11)
RDW: 13.6 % (ref 11.5–15.5)
WBC: 5.7 10*3/uL (ref 4.0–10.5)

## 2013-11-05 LAB — APTT: aPTT: 28 seconds (ref 24–37)

## 2013-11-05 LAB — PROTIME-INR
INR: 1.04 (ref 0.00–1.49)
PROTHROMBIN TIME: 13.6 s (ref 11.6–15.2)

## 2013-11-05 LAB — MRSA PCR SCREENING: MRSA by PCR: NEGATIVE

## 2013-11-05 LAB — GLUCOSE, CAPILLARY: Glucose-Capillary: 165 mg/dL — ABNORMAL HIGH (ref 70–99)

## 2013-11-05 MED ORDER — ALBUTEROL SULFATE (2.5 MG/3ML) 0.083% IN NEBU
2.5000 mg | INHALATION_SOLUTION | Freq: Four times a day (QID) | RESPIRATORY_TRACT | Status: DC | PRN
Start: 2013-11-05 — End: 2013-11-07

## 2013-11-05 MED ORDER — ZOLPIDEM TARTRATE 5 MG PO TABS
5.0000 mg | ORAL_TABLET | Freq: Every evening | ORAL | Status: DC | PRN
  Administered 2013-11-05 – 2013-11-06 (×2): 5 mg via ORAL
  Filled 2013-11-05 (×2): qty 1

## 2013-11-05 MED ORDER — ASPIRIN 300 MG RE SUPP: 300.0000 mg | Freq: Every day | RECTAL | Status: DC

## 2013-11-05 MED ORDER — ACETAMINOPHEN 325 MG PO TABS
650.0000 mg | ORAL_TABLET | ORAL | Status: DC | PRN
  Administered 2013-11-06 (×2): 650 mg via ORAL
  Filled 2013-11-05 (×3): qty 2

## 2013-11-05 MED ORDER — SODIUM CHLORIDE 0.9 % IV SOLN
INTRAVENOUS | Status: AC
  Administered 2013-11-05: 19:00:00 via INTRAVENOUS

## 2013-11-05 MED ORDER — STROKE: EARLY STAGES OF RECOVERY BOOK
Freq: Once | Status: AC
Start: 2013-11-05 — End: 2013-11-05
  Administered 2013-11-05: 19:00:00
  Filled 2013-11-05: qty 1

## 2013-11-05 MED ORDER — OMEGA-3-ACID ETHYL ESTERS 1 G PO CAPS
1.0000 g | ORAL_CAPSULE | Freq: Two times a day (BID) | ORAL | Status: DC
  Administered 2013-11-05 – 2013-11-07 (×4): 1 g via ORAL
  Filled 2013-11-05 (×4): qty 1

## 2013-11-05 MED ORDER — VITAMIN D 1000 UNITS PO TABS
1000.0000 [IU] | ORAL_TABLET | Freq: Every day | ORAL | Status: DC
  Administered 2013-11-06 – 2013-11-07 (×2): 1000 [IU] via ORAL
  Filled 2013-11-05 (×2): qty 1

## 2013-11-05 MED ORDER — SENNOSIDES-DOCUSATE SODIUM 8.6-50 MG PO TABS: 1.0000 | ORAL_TABLET | Freq: Every evening | ORAL | Status: DC | PRN

## 2013-11-05 MED ORDER — ATORVASTATIN CALCIUM 10 MG PO TABS
20.0000 mg | ORAL_TABLET | Freq: Every evening | ORAL | Status: DC
  Administered 2013-11-05 – 2013-11-07 (×3): 20 mg via ORAL
  Filled 2013-11-05 (×3): qty 2

## 2013-11-05 MED ORDER — ASPIRIN 325 MG PO TABS
325.0000 mg | ORAL_TABLET | Freq: Every day | ORAL | Status: DC
  Administered 2013-11-06 – 2013-11-07 (×2): 325 mg via ORAL
  Filled 2013-11-05 (×2): qty 1

## 2013-11-05 MED ORDER — ACETAMINOPHEN 650 MG RE SUPP: 650.0000 mg | RECTAL | Status: DC | PRN

## 2013-11-05 NOTE — Consult Note (Signed)
Referring Physician: Hongalgi     Chief Complaint: stroke  HPI:                                                                                                                                         Felicia Hunter is an 73 y.o. female with previous CVA in 2013 and no residual symptoms. Patient was by herself yesterday when around 4PM she noted she was having a hard time starting her car and some expressive difficulties.  Her daughter noted she had some word substitution and told her to called her PCP. She called her primary MD today who stated she should be seen in ED.  Currently she feels she is much improved with only slight difficulty finding words.  She takes ASA 325 daily and has not missed a dose.    Date last known well: Date: 11/04/2013 Time last known well: Time: 16:00 tPA Given: No: out of window  Past Medical History  Diagnosis Date  . GERD (gastroesophageal reflux disease)   . Arthritis   . Cough     PERSISTANT COUGH X 20 YRS  . Tubulovillous adenoma of colon 08/2003  . Diverticulosis   . Hyperlipidemia   . Osteopenia   . Alopecia   . MVP (mitral valve prolapse)   . Clavicle fracture   . Disorders of bilirubin excretion   . Stroke 06/07/11    NO RESIDUAL PROBLEMS  . Difficulty sleeping     takes Ambien  . Breast cancer     Past Surgical History  Procedure Laterality Date  . Collarbone  september 2011    pinned and plated, right  . Abdominal hysterectomy  1993  . Vein surgery  1974    left leg  . Nasal septum surgery    . Tonsillectomy    . Joint replacement  july 9th, 2012    left knee replacement   . Knee reconstruction  7/12    revision lt total knee  . Breast enhancement surgery      x3-then removed 2011 for bilat mastectomies  . Chest wall tumor excision  5/12    noduals removed-neg  . Breast capsulotomy      after bilat mastectomies-implants removed  . Breast surgery  june 2011    double mastectomy nodes from rt  . Mass excision Left 06/19/2012     Procedure: EXCISION OF LARGE MASS LEFT NECK/SHOULDER WITH PLASTIC CLOSURE;  Surgeon: Cristine Polio, MD;  Location: [facility];  Service: Plastics;  Laterality: Left;  . Total hip arthroplasty Right 04/10/2013    Procedure: RIGHT TOTAL HIP ARTHROPLASTY ANTERIOR APPROACH;  Surgeon: [name], MD;  Location: [facility];  Service: Orthopedics;  Laterality: Right;    Family History  Problem Relation Age of Onset  . Aneurysm Father    Social History:  reports that she has never smoked. She has never used  smokeless tobacco. She reports that she drinks about .6 ounces of alcohol per week. She reports that she does not use illicit drugs.  Allergies:  Allergies  Allergen Reactions  . Sulfonamide Derivatives Rash    Medications:                                                                                                                           No current facility-administered medications for this encounter.   Current Outpatient Prescriptions  Medication Sig Dispense Refill  . aspirin 325 MG tablet Take 325 mg by mouth daily.      Marland Kitchen atorvastatin (LIPITOR) 20 MG tablet Take 20 mg by mouth every evening.       Marland Kitchen BIOTIN PO Take 1 tablet by mouth daily.      . calcium carbonate (CALCIUM 600) 600 MG TABS Take 600 mg by mouth 2 (two) times daily with a meal.       . cholecalciferol (VITAMIN D) 1000 UNITS tablet Take 1,000 Units by mouth daily.      Marland Kitchen glucosamine-chondroitin 500-400 MG tablet Take 1 tablet by mouth 2 (two) times daily.      . Multiple Vitamins-Minerals (MULTIVITAMIN & MINERAL PO) Take by mouth every morning.      Marland Kitchen omega-3 acid ethyl esters (LOVAZA) 1 G capsule Take 1 g by mouth 2 (two) times daily.       Marland Kitchen zolpidem (AMBIEN) 10 MG tablet Take 10 mg by mouth at bedtime as needed. sleep      . [DISCONTINUED] CALCIUM PO Take by mouth 2 (two) times daily.          ROS:                                                                                                                                        History obtained from the patient  General ROS: negative for - chills, fatigue, fever, night sweats, weight gain or weight loss Psychological ROS: negative for - behavioral disorder, hallucinations, memory difficulties, mood swings or suicidal ideation Ophthalmic ROS: negative for - blurry vision, double vision, eye pain or loss of vision ENT ROS: negative for - epistaxis, nasal discharge, oral lesions, sore throat, tinnitus or vertigo Allergy and Immunology ROS: negative for - hives or itchy/watery eyes Hematological and Lymphatic ROS: negative for - bleeding problems, bruising or swollen lymph nodes  Endocrine ROS: negative for - galactorrhea, hair pattern changes, polydipsia/polyuria or temperature intolerance Respiratory ROS: negative for - cough, hemoptysis, shortness of breath or wheezing Cardiovascular ROS: negative for - chest pain, dyspnea on exertion, edema or irregular heartbeat Gastrointestinal ROS: negative for - abdominal pain, diarrhea, hematemesis, nausea/vomiting or stool incontinence Genito-Urinary ROS: negative for - dysuria, hematuria, incontinence or urinary frequency/urgency Musculoskeletal ROS: negative for - joint swelling or muscular weakness Neurological ROS: as noted in HPI Dermatological ROS: negative for rash and skin lesion changes  Neurologic Examination:                                                                                                      Blood pressure 145/109, pulse 64, temperature 98 F (36.7 C), temperature source Oral, resp. rate 16, SpO2 95.00%.   General: NAD Mental Status: Alert, oriented, thought content appropriate.  Speech fluent without evidence of aphasia.  Able to follow 3 step commands without difficulty. Cranial Nerves: II: Discs flat bilaterally; Visual fields grossly normal, pupils equal, round, reactive to light and accommodation III,IV, VI: ptosis not present, extra-ocular motions intact  bilaterally V,VII: smile symmetric, facial light touch sensation normal bilaterally VIII: hearing normal bilaterally IX,X: gag reflex present XI: bilateral shoulder shrug XII: midline tongue extension without atrophy or fasciculations  Motor: Right : Upper extremity   5/5    Left:     Upper extremity   5/5  Lower extremity   5/5     Lower extremity   5/5 Tone and bulk:normal tone throughout; no atrophy noted Sensory: Pinprick and light touch intact throughout, bilaterally Deep Tendon Reflexes:  Right: Upper Extremity   Left: Upper extremity   biceps (C-5 to C-6) 2/4   biceps (C-5 to C-6) 2/4 tricep (C7) 2/4    triceps (C7) 2/4 Brachioradialis (C6) 2/4  Brachioradialis (C6) 2/4  Lower Extremity Lower Extremity  quadriceps (L-2 to L-4) 2/4   quadriceps (L-2 to L-4) 2/4 Achilles (S1) 2/4   Achilles (S1) 2/4  Plantars: Right: downgoing   Left: downgoing Cerebellar: normal finger-to-nose,  normal heel-to-shin test Gait: not tested CV: pulses palpable throughout    Lab Results: Basic Metabolic Panel:  Recent Labs Lab 11/05/13 1115  NA 139  K 4.1  CL 103  CO2 22  GLUCOSE 114*  BUN 12  CREATININE 0.73  CALCIUM 10.2    Liver Function Tests:  Recent Labs Lab 11/05/13 1115  AST 22  ALT 19  ALKPHOS 70  BILITOT 1.0  PROT 7.1  ALBUMIN 4.0   No results found for this basename: LIPASE, AMYLASE,  in the last 168 hours No results found for this basename: AMMONIA,  in the last 168 hours  CBC:  Recent Labs Lab 11/05/13 1115  WBC 5.7  NEUTROABS 4.2  HGB 14.4  HCT 43.7  MCV 95.0  PLT 234    Cardiac Enzymes: No results found for this basename: CKTOTAL, CKMB, CKMBINDEX, TROPONINI,  in the last 168 hours  Lipid Panel: No results found for this basename: CHOL, TRIG, HDL, CHOLHDL, VLDL, LDLCALC,  in the last  168 hours  CBG:  Recent Labs Lab 11/05/13 1049  GLUCAP 133*    Microbiology: Results for orders placed during the hospital encounter of 04/04/13   SURGICAL PCR SCREEN     Status: None   Collection Time    04/04/13 11:50 AM      Result Value Ref Range Status   MRSA, PCR NEGATIVE  NEGATIVE Final   Staphylococcus aureus NEGATIVE  NEGATIVE Final   Comment:            The Xpert SA Assay (FDA     approved for NASAL specimens     in patients over 70 years of age),     is one component of     a comprehensive surveillance     program.  Test performance has     been validated by [employer] for patients greater     than or equal to 7 year old.     It is not intended     to diagnose infection nor to     guide or monitor treatment.    Coagulation Studies:  Recent Labs  11/05/13 1115  LABPROT 13.6  INR 1.04    Imaging: Ct Head (brain) Wo Contrast  11/05/2013   CLINICAL DATA:  A aphasia and speech difficulty.  EXAM: CT HEAD WITHOUT CONTRAST  TECHNIQUE: Contiguous axial images were obtained from the base of the skull through the vertex without intravenous contrast.  COMPARISON:  Noncontrast CT scan of the brain of June 07, 2011  FINDINGS: The ventricles are normal in size and position. There are areas of hypodensity in the mid parietal cortex on the right which is likely chronic. On the left there is subtle hypodensity worrisome for acute ischemia which crosses the gray and white matter. There is no acute intracranial hemorrhage. There are old lacunar infarctions in the left cerebellar hemisphere. The observed paranasal sinuses and mastoid air cells are clear. There is no skull fracture.  IMPRESSION: 1. Acute ischemic change in the left parietal lobe is present consistent with an evolving ischemic infarction. Encephalomalacia in the right parietal lobe likely reflects previous ischemic insult. 2. There are old infarcts in the left cerebellar hemisphere. 3. Next item there is no acute intracranial hemorrhage.   Electronically Signed   By: David  Martinique   On: 11/05/2013 11:53   Mr Jodene Nam Head Wo Contrast  11/05/2013   CLINICAL DATA:   Aphasia  EXAM: MRI HEAD WITHOUT CONTRAST  MRA HEAD WITHOUT CONTRAST  TECHNIQUE: Multiplanar, multiecho pulse sequences of the brain and surrounding structures were obtained without intravenous contrast. Angiographic images of the head were obtained using MRA technique without contrast.  COMPARISON:  Head CT same day.  MRI 06/08/2011.  FINDINGS: MRI HEAD FINDINGS  Diffusion imaging shows a 4 cm region of acute infarction affecting the left insula and frontoparietal cortical and subcortical brain. This consistent with left MCA branch vessel infarction. There is mild swelling but no hemorrhage or mass effect. No other area of acute infarction.  The brainstem is normal. There are old cerebellar infarctions, most notable inferiorly on the left. The cerebral hemispheres otherwise show an old cortical and subcortical infarction in the right parietal region and mild chronic small-vessel change of the deep white matter. No mass lesion, hemorrhage, hydrocephalus or extra-axial collection. No pituitary mass. No inflammatory sinus disease. No skull or skullbase lesion.  MRA HEAD FINDINGS  Both internal carotid arteries are widely patent into the brain. The anterior and  middle cerebral vessels are patent without proximal stenosis, aneurysm or vascular malformation. There is narrowing of a left MCA branch probably serving the region of infarction, presumably due to embolic disease.  Both vertebral arteries are widely patent to the basilar. No basilar stenosis. Posterior circulation branch vessels are patent.  IMPRESSION: Acute infarction affecting the left insula and frontoparietal cortical and subcortical brain consistent with left MCA branch vessel territory infarction. Mild swelling but no hemorrhage or mass effect.  Old ischemic changes elsewhere throughout the brain as outlined above.  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   Electronically Signed   By: Nelson Chimes M.D.   On: 11/05/2013 14:12   Mr Brain Wo Contrast  11/05/2013   CLINICAL DATA:  Aphasia  EXAM: MRI HEAD WITHOUT CONTRAST  MRA HEAD WITHOUT CONTRAST  TECHNIQUE: Multiplanar, multiecho pulse sequences of the brain and surrounding structures were obtained without intravenous contrast. Angiographic images of the head were obtained using MRA technique without contrast.  COMPARISON:  Head CT same day.  MRI 06/08/2011.  FINDINGS: MRI HEAD FINDINGS  Diffusion imaging shows a 4 cm region of acute infarction affecting the left insula and frontoparietal cortical and subcortical brain. This consistent with left MCA branch vessel infarction. There is mild swelling but no hemorrhage or mass effect. No other area of acute infarction.  The brainstem is normal. There are old cerebellar infarctions, most notable inferiorly on the left. The cerebral hemispheres otherwise show an old cortical and subcortical infarction in the right parietal region and mild chronic small-vessel change of the deep white matter. No mass lesion, hemorrhage, hydrocephalus or extra-axial collection. No pituitary mass. No inflammatory sinus disease. No skull or skullbase lesion.  MRA HEAD FINDINGS  Both internal carotid arteries are widely patent into the brain. The anterior and middle cerebral vessels are patent without proximal stenosis, aneurysm or vascular malformation. There is narrowing of a left MCA branch probably serving the region of infarction, presumably due to embolic disease.  Both vertebral arteries are widely patent to the basilar. No basilar stenosis. Posterior circulation branch vessels are patent.  IMPRESSION: Acute infarction affecting the left insula and frontoparietal cortical and subcortical brain consistent with left MCA branch vessel territory infarction. Mild swelling but no hemorrhage or mass effect.  Old ischemic changes elsewhere throughout the brain as outlined above.  Stenosis of left MCA branch vessel serving the  region of infarction, probably due to embolic disease.   Electronically Signed   By: Nelson Chimes M.D.   On: 11/05/2013 14:12    Assessment and plan discussed with with attending physician and they are in agreement.    Etta Quill PA-C [facility] [phone]  11/05/2013, 4:03 PM   Assessment: 73 y.o. female with acute left fronto parietal infarct consistent with MCA territory likely embolic in origin.   Stroke Risk Factors - hyperlipidemia  1. HgbA1c, fasting lipid panel 2. PT consult, OT consult, Speech consult 3. Echocardiogram 4. Carotid dopplers 5. Prophylactic therapy-Antiplatelet med: Aspirin - dose 325 mg daily 6. Risk factor modification 7. Telemetry monitoring 8. Frequent neuro checks  I personally participated in this patient's evaluation and management, including formulating the above clinical impression and management recommendations.  Rush Farmer M.D. [facility] [phone]

## 2013-11-05 NOTE — ED Notes (Signed)
Pt returned from MRI °

## 2013-11-05 NOTE — H&P (Signed)
History and Physical  Felicia Hunter WER:[id number] DOB: Jul 21, 1940 DOA: 11/05/2013  Referring physician: Dr. Lacretia Leigh PCP: Horatio Pel, MD  Outpatient Specialists:  1. Neurology: Dr. Antony Contras  Chief Complaint: Speech difficulty  HPI: Felicia Hunter is a 73 y.o. female with history of previous CVA in 2013 without residual deficits, on aspirin 325 mg daily, hyperlipidemia, mitral valve prolapse, extensive surgical history, presented to the ED with complaints of speech difficulty. On 11/04/13, patient was at church when she first noticed "I was just not feeling right" at approximately 4 PM. She denied any specific complaints such as slurred speech, facial asymmetry, asymmetric limb weakness, numbness or tingling. She's had chronic intermittent headache for the last month. She was by herself during this time. She drove herself home and noticed difficulty turning of her car. When she reached home, she did notice some slurred speech and speech abnormality. She called her PCPs office today and was advised to come to the ED for evaluation. In the ED, MRI brain confirms acute left MCA territory infarct likely embolic in origin with mild swelling but no hemorrhage or mass effect along with stenosis of left MCA branch vessel probably due to embolic disease. Hospitalist admission requested. Patient states that her symptoms have improved.   Review of Systems: All systems reviewed and apart from history of presenting illness, are negative.  Past Medical History  Diagnosis Date  . GERD (gastroesophageal reflux disease)   . Arthritis   . Cough     PERSISTANT COUGH X 20 YRS  . Tubulovillous adenoma of colon 08/2003  . Diverticulosis   . Hyperlipidemia   . Osteopenia   . Alopecia   . MVP (mitral valve prolapse)   . Clavicle fracture   . Disorders of bilirubin excretion   . Stroke 06/07/11    NO RESIDUAL PROBLEMS  . Difficulty sleeping     takes Ambien  . Breast cancer    Past  Surgical History  Procedure Laterality Date  . Collarbone  september 2011    pinned and plated, right  . Abdominal hysterectomy  1993  . Vein surgery  1974    left leg  . Nasal septum surgery    . Tonsillectomy    . Joint replacement  july 9th, 2012    left knee replacement   . Knee reconstruction  7/12    revision lt total knee  . Breast enhancement surgery      x3-then removed 2011 for bilat mastectomies  . Chest wall tumor excision  5/12    noduals removed-neg  . Breast capsulotomy      after bilat mastectomies-implants removed  . Breast surgery  june 2011    double mastectomy nodes from rt  . Mass excision Left 06/19/2012    Procedure: EXCISION OF LARGE MASS LEFT NECK/SHOULDER WITH PLASTIC CLOSURE;  Surgeon: Cristine Polio, MD;  Location: [facility];  Service: Plastics;  Laterality: Left;  . Total hip arthroplasty Right 04/10/2013    Procedure: RIGHT TOTAL HIP ARTHROPLASTY ANTERIOR APPROACH;  Surgeon: [name], MD;  Location: [facility];  Service: Orthopedics;  Laterality: Right;   Social History:  reports that she has never smoked. She has never used smokeless tobacco. She reports that she drinks about .6 ounces of alcohol per week. She reports that she does not use illicit drugs. Widowed. Lives alone. Independent of activities of daily living.  Allergies  Allergen Reactions  . Sulfonamide Derivatives Rash    Family  History  Problem Relation Age of Onset  . Aneurysm Father     Prior to Admission medications   Medication Sig Start Date End Date Taking? Authorizing Provider  aspirin 325 MG tablet Take 325 mg by mouth daily.   Yes Historical Provider, MD  atorvastatin (LIPITOR) 20 MG tablet Take 20 mg by mouth every evening.  06/07/12  Yes Historical Provider, MD  BIOTIN PO Take 1 tablet by mouth daily.   Yes Historical Provider, MD  calcium carbonate (CALCIUM 600) 600 MG TABS Take 600 mg by mouth 2 (two) times daily with a meal.    Yes Historical  Provider, MD  cholecalciferol (VITAMIN D) 1000 UNITS tablet Take 1,000 Units by mouth daily.   Yes Historical Provider, MD  glucosamine-chondroitin 500-400 MG tablet Take 1 tablet by mouth 2 (two) times daily.   Yes Historical Provider, MD  Multiple Vitamins-Minerals (MULTIVITAMIN & MINERAL PO) Take by mouth every morning.   Yes Historical Provider, MD  omega-3 acid ethyl esters (LOVAZA) 1 G capsule Take 1 g by mouth 2 (two) times daily.    Yes Historical Provider, MD  zolpidem (AMBIEN) 10 MG tablet Take 10 mg by mouth at bedtime as needed. sleep   Yes Historical Provider, MD   Physical Exam: Filed Vitals:   11/05/13 1545 11/05/13 1600 11/05/13 1615 11/05/13 1650  BP: 165/94 157/96 148/87   Pulse: 74 77 62   Temp:      TempSrc:      Resp:      Height:    5\' 5"  (1.651 m)  Weight:    61.462 kg (135 lb 8 oz)  SpO2: 95% 94% 94%   Temperature 57F and respiratory rate 16 per minute   General exam: Moderately built and nourished pleasant elderly female patient, lying comfortably supine on the gurney in no obvious distress.  Head, eyes and ENT: Nontraumatic and normocephalic. Pupils equally reacting to light and accommodation. Oral mucosa moist.  Neck: Supple. No JVD, carotid bruit or thyromegaly.  Lymphatics: No lymphadenopathy.  Respiratory system: Clear to auscultation. No increased work of breathing.  Cardiovascular system: S1 and S2 heard, RRR. No JVD, murmurs, gallops, clicks or pedal edema.  Gastrointestinal system: Abdomen is nondistended, soft and nontender. Normal bowel sounds heard. No organomegaly or masses appreciated.  Central nervous system: Alert and oriented x3. Expressive aphasia +. Mild facial asymmetry with left nasolabial fold more prominent than right.  Extremities: Symmetric 5 x 5 power. Peripheral pulses symmetrically felt.   Skin: No rashes or acute findings.  Musculoskeletal system: Negative exam.  Psychiatry: Pleasant and cooperative.   Labs on  Admission:  Basic Metabolic Panel:  Recent Labs Lab 11/05/13 1115  NA 139  K 4.1  CL 103  CO2 22  GLUCOSE 114*  BUN 12  CREATININE 0.73  CALCIUM 10.2   Liver Function Tests:  Recent Labs Lab 11/05/13 1115  AST 22  ALT 19  ALKPHOS 70  BILITOT 1.0  PROT 7.1  ALBUMIN 4.0   No results found for this basename: LIPASE, AMYLASE,  in the last 168 hours No results found for this basename: AMMONIA,  in the last 168 hours CBC:  Recent Labs Lab 11/05/13 1115  WBC 5.7  NEUTROABS 4.2  HGB 14.4  HCT 43.7  MCV 95.0  PLT 234   Cardiac Enzymes: No results found for this basename: CKTOTAL, CKMB, CKMBINDEX, TROPONINI,  in the last 168 hours  BNP (last 3 results) No results found for this basename: PROBNP,  in the last 8760 hours CBG:  Recent Labs Lab 11/05/13 1049  GLUCAP 133*    Radiological Exams on Admission: Ct Head (brain) Wo Contrast  11/05/2013   CLINICAL DATA:  A aphasia and speech difficulty.  EXAM: CT HEAD WITHOUT CONTRAST  TECHNIQUE: Contiguous axial images were obtained from the base of the skull through the vertex without intravenous contrast.  COMPARISON:  Noncontrast CT scan of the brain of June 07, 2011  FINDINGS: The ventricles are normal in size and position. There are areas of hypodensity in the mid parietal cortex on the right which is likely chronic. On the left there is subtle hypodensity worrisome for acute ischemia which crosses the gray and white matter. There is no acute intracranial hemorrhage. There are old lacunar infarctions in the left cerebellar hemisphere. The observed paranasal sinuses and mastoid air cells are clear. There is no skull fracture.  IMPRESSION: 1. Acute ischemic change in the left parietal lobe is present consistent with an evolving ischemic infarction. Encephalomalacia in the right parietal lobe likely reflects previous ischemic insult. 2. There are old infarcts in the left cerebellar hemisphere. 3. Next item there is no acute  intracranial hemorrhage.   Electronically Signed   By: David  Martinique   On: 11/05/2013 11:53   Mr Jodene Nam Head Wo Contrast  11/05/2013   CLINICAL DATA:  Aphasia  EXAM: MRI HEAD WITHOUT CONTRAST  MRA HEAD WITHOUT CONTRAST  TECHNIQUE: Multiplanar, multiecho pulse sequences of the brain and surrounding structures were obtained without intravenous contrast. Angiographic images of the head were obtained using MRA technique without contrast.  COMPARISON:  Head CT same day.  MRI 06/08/2011.  FINDINGS: MRI HEAD FINDINGS  Diffusion imaging shows a 4 cm region of acute infarction affecting the left insula and frontoparietal cortical and subcortical brain. This consistent with left MCA branch vessel infarction. There is mild swelling but no hemorrhage or mass effect. No other area of acute infarction.  The brainstem is normal. There are old cerebellar infarctions, most notable inferiorly on the left. The cerebral hemispheres otherwise show an old cortical and subcortical infarction in the right parietal region and mild chronic small-vessel change of the deep white matter. No mass lesion, hemorrhage, hydrocephalus or extra-axial collection. No pituitary mass. No inflammatory sinus disease. No skull or skullbase lesion.  MRA HEAD FINDINGS  Both internal carotid arteries are widely patent into the brain. The anterior and middle cerebral vessels are patent without proximal stenosis, aneurysm or vascular malformation. There is narrowing of a left MCA branch probably serving the region of infarction, presumably due to embolic disease.  Both vertebral arteries are widely patent to the basilar. No basilar stenosis. Posterior circulation branch vessels are patent.  IMPRESSION: Acute infarction affecting the left insula and frontoparietal cortical and subcortical brain consistent with left MCA branch vessel territory infarction. Mild swelling but no hemorrhage or mass effect.  Old ischemic changes elsewhere throughout the brain as  outlined above.  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   Electronically Signed   By: Nelson Chimes M.D.   On: 11/05/2013 14:12   Mr Brain Wo Contrast  11/05/2013   CLINICAL DATA:  Aphasia  EXAM: MRI HEAD WITHOUT CONTRAST  MRA HEAD WITHOUT CONTRAST  TECHNIQUE: Multiplanar, multiecho pulse sequences of the brain and surrounding structures were obtained without intravenous contrast. Angiographic images of the head were obtained using MRA technique without contrast.  COMPARISON:  Head CT same day.  MRI 06/08/2011.  FINDINGS: MRI HEAD FINDINGS  Diffusion imaging shows a 4 cm region of acute infarction affecting the left insula and frontoparietal cortical and subcortical brain. This consistent with left MCA branch vessel infarction. There is mild swelling but no hemorrhage or mass effect. No other area of acute infarction.  The brainstem is normal. There are old cerebellar infarctions, most notable inferiorly on the left. The cerebral hemispheres otherwise show an old cortical and subcortical infarction in the right parietal region and mild chronic small-vessel change of the deep white matter. No mass lesion, hemorrhage, hydrocephalus or extra-axial collection. No pituitary mass. No inflammatory sinus disease. No skull or skullbase lesion.  MRA HEAD FINDINGS  Both internal carotid arteries are widely patent into the brain. The anterior and middle cerebral vessels are patent without proximal stenosis, aneurysm or vascular malformation. There is narrowing of a left MCA branch probably serving the region of infarction, presumably due to embolic disease.  Both vertebral arteries are widely patent to the basilar. No basilar stenosis. Posterior circulation branch vessels are patent.  IMPRESSION: Acute infarction affecting the left insula and frontoparietal cortical and subcortical brain consistent with left MCA branch vessel territory infarction. Mild swelling  but no hemorrhage or mass effect.  Old ischemic changes elsewhere throughout the brain as outlined above.  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   Electronically Signed   By: Nelson Chimes M.D.   On: 11/05/2013 14:12    EKG: Independently reviewed. Sinus rhythm, left anterior fascicular block, nonspecific ST-T changes and no acute findings.  Assessment/Plan Principal Problem:   CVA (cerebral infarction)-mid/inf cerebellar infarct 3/25 - Likely thromboembolic in etiology. Admit to telemetry. Complete stroke workup including carotid Dopplers, 2-D echo, hemoglobin A1c and fasting lipids. PT, OT and ST evaluation. She had this stroke while compliant with aspirin (took today's dose pta) -will need change? Plavix. Neurology consultation appreciated. Discussed with Dr. Nicole Kindred who recommends continuing aspirin until evaluation by stroke team in a.m & SCD's for DVT prophylaxis (avoid heparin due to risk of hemorrhagic transformation). Continue statin and Lovaza. Stroke risk factors: Previous CVA and hyperlipidemia.  Active Problems:   ASTHMA - Stable. When necessary bronchodilators.    Other and unspecified hyperlipidemia - Continue home statins and Lovaza    Expressive aphasia - Management as above.      Code Status: Full  Family Communication: None at bedside  Disposition Plan: Home when medically stable.   Time spent: 36 minutes  HONGALGI,ANAND, MD, FACP, FHM. [facility] Pager [phone]  If 7PM-7AM, please contact night-coverage [email or web address] Password [facility] 11/05/2013, 4:58 PM

## 2013-11-05 NOTE — ED Provider Notes (Signed)
CSN: [id number]     Arrival date & time 11/05/13  1034 History   First MD Initiated Contact with Patient 11/05/13 1052     Chief Complaint  Patient presents with  . Aphasia     (Consider location/radiation/quality/duration/timing/severity/associated sxs/prior Treatment) HPI Comments: Patient here complaining of went to her episode of diffuse weakness and trouble finding her words yesterday symptoms started at rest and resolved spontaneously. No treatment used for symptoms. Denies any severe headache, vomiting, unilateral weakness, gait disturbance, syncope. No chest pain or chest pressure. No abdominal pain. History of stroke in the past and this is different. Called her Dr. today and was told to come to the office. Denies any recurrence of her symptoms.  The history is provided by the patient and a relative.    Past Medical History  Diagnosis Date  . GERD (gastroesophageal reflux disease)   . Arthritis   . Cough     PERSISTANT COUGH X 20 YRS  . Tubulovillous adenoma of colon 08/2003  . Diverticulosis   . Hyperlipidemia   . Osteopenia   . Alopecia   . MVP (mitral valve prolapse)   . Clavicle fracture   . Disorders of bilirubin excretion   . Stroke 06/07/11    NO RESIDUAL PROBLEMS  . Difficulty sleeping     takes Ambien  . Breast cancer    Past Surgical History  Procedure Laterality Date  . Collarbone  september 2011    pinned and plated, right  . Abdominal hysterectomy  1993  . Vein surgery  1974    left leg  . Nasal septum surgery    . Tonsillectomy    . Joint replacement  july 9th, 2012    left knee replacement   . Knee reconstruction  7/12    revision lt total knee  . Breast enhancement surgery      x3-then removed 2011 for bilat mastectomies  . Chest wall tumor excision  5/12    noduals removed-neg  . Breast capsulotomy      after bilat mastectomies-implants removed  . Breast surgery  june 2011    double mastectomy nodes from rt  . Mass excision Left 06/19/2012     Procedure: EXCISION OF LARGE MASS LEFT NECK/SHOULDER WITH PLASTIC CLOSURE;  Surgeon: Cristine Polio, MD;  Location: [facility];  Service: Plastics;  Laterality: Left;  . Total hip arthroplasty Right 04/10/2013    Procedure: RIGHT TOTAL HIP ARTHROPLASTY ANTERIOR APPROACH;  Surgeon: [name], MD;  Location: [facility];  Service: Orthopedics;  Laterality: Right;   Family History  Problem Relation Age of Onset  . Aneurysm Father    History  Substance Use Topics  . Smoking status: Never Smoker   . Smokeless tobacco: Never Used  . Alcohol Use: 0.6 oz/week    1 Glasses of wine per week     Comment: 1 glass of wine per night   OB History   Grav Para Term Preterm Abortions TAB SAB Ect Mult Living                 Review of Systems  All other systems reviewed and are negative.     Allergies  Sulfonamide derivatives  Home Medications   Prior to Admission medications   Medication Sig Start Date End Date Taking? Authorizing Provider  aspirin 325 MG tablet Take 325 mg by mouth daily.   Yes Historical Provider, MD  atorvastatin (LIPITOR) 20 MG tablet Take 20 mg by mouth every  evening.  06/07/12  Yes Historical Provider, MD  BIOTIN PO Take 1 tablet by mouth daily.   Yes Historical Provider, MD  calcium carbonate (CALCIUM 600) 600 MG TABS Take 600 mg by mouth 2 (two) times daily with a meal.    Yes Historical Provider, MD  cholecalciferol (VITAMIN D) 1000 UNITS tablet Take 1,000 Units by mouth daily.   Yes Historical Provider, MD  glucosamine-chondroitin 500-400 MG tablet Take 1 tablet by mouth 2 (two) times daily.   Yes Historical Provider, MD  Multiple Vitamins-Minerals (MULTIVITAMIN & MINERAL PO) Take by mouth every morning.   Yes Historical Provider, MD  omega-3 acid ethyl esters (LOVAZA) 1 G capsule Take 1 g by mouth 2 (two) times daily.    Yes Historical Provider, MD  zolpidem (AMBIEN) 10 MG tablet Take 10 mg by mouth at bedtime as needed. sleep   Yes Historical  Provider, MD   BP 166/95  Pulse 82  Temp(Src) 97.3 F (36.3 C) (Oral)  Resp 18  SpO2 97% Physical Exam  Nursing note and vitals reviewed. Constitutional: She is oriented to person, place, and time. She appears well-developed and well-nourished.  Non-toxic appearance. No distress.  HENT:  Head: Normocephalic and atraumatic.  Eyes: Conjunctivae, EOM and lids are normal. Pupils are equal, round, and reactive to light.  Neck: Normal range of motion. Neck supple. No tracheal deviation present. No mass present.  Cardiovascular: Normal rate, regular rhythm and normal heart sounds.  Exam reveals no gallop.   No murmur heard. Pulmonary/Chest: Effort normal and breath sounds normal. No stridor. No respiratory distress. She has no decreased breath sounds. She has no wheezes. She has no rhonchi. She has no rales.  Abdominal: Soft. Normal appearance and bowel sounds are normal. She exhibits no distension. There is no tenderness. There is no rebound and no CVA tenderness.  Musculoskeletal: Normal range of motion. She exhibits no edema and no tenderness.  Neurological: She is alert and oriented to person, place, and time. She has normal strength. No cranial nerve deficit or sensory deficit. GCS eye subscore is 4. GCS verbal subscore is 5. GCS motor subscore is 6.  Skin: Skin is warm and dry. No abrasion and no rash noted.  Psychiatric: She has a normal mood and affect. Her speech is normal and behavior is normal.    ED Course  Procedures (including critical care time) Labs Review Labs Reviewed  CBG MONITORING, ED - Abnormal; Notable for the following:    Glucose-Capillary 133 (*)    All other components within normal limits  PROTIME-INR  APTT  CBC  DIFFERENTIAL  COMPREHENSIVE METABOLIC PANEL  I-STAT TROPOININ, ED    Imaging Review No results found.   EKG Interpretation   Date/Time:  Monday November 05 2013 10:42:20 EDT Ventricular Rate:  82 PR Interval:  116 QRS Duration: 80 QT  Interval:  358 QTC Calculation: 418 R Axis:   -46 Text Interpretation:  Unusual P axis, possible ectopic atrial rhythm Left  anterior fascicular block Nonspecific ST and T wave abnormality Abnormal  ECG No significant change since last tracing Confirmed by Norrine Ballester  MD,  Shakiya Mcneary ([id number]) on 11/05/2013 11:19:20 AM      MDM   Final diagnoses:  None    We'll consult neurology and admit to internal medicine service for evaluation of her stroke.   Leota Jacobsen, MD 11/05/13 [phone]

## 2013-11-05 NOTE — ED Notes (Signed)
Pt c/o aphasia and difficulty performing some simple tasks starting yesterday that has improved but still having very mild difficulty "finding words"; no tingling or other obvious neuro deficit noted

## 2013-11-06 DIAGNOSIS — F3289 Other specified depressive episodes: Secondary | ICD-10-CM

## 2013-11-06 DIAGNOSIS — F329 Major depressive disorder, single episode, unspecified: Secondary | ICD-10-CM

## 2013-11-06 DIAGNOSIS — I517 Cardiomegaly: Secondary | ICD-10-CM

## 2013-11-06 DIAGNOSIS — Z853 Personal history of malignant neoplasm of breast: Secondary | ICD-10-CM

## 2013-11-06 DIAGNOSIS — D62 Acute posthemorrhagic anemia: Secondary | ICD-10-CM

## 2013-11-06 LAB — LIPID PANEL
CHOL/HDL RATIO: 1.8 ratio
CHOLESTEROL: 159 mg/dL (ref 0–200)
HDL: 88 mg/dL (ref >39)
LDL Cholesterol: 57 mg/dL (ref 0–99)
TRIGLYCERIDES: 69 mg/dL (ref <150)
VLDL: 14 mg/dL (ref 0–40)

## 2013-11-06 LAB — HEMOGLOBIN A1C
Hgb A1c MFr Bld: 5.8 % — ABNORMAL HIGH (ref <5.7)
Mean Plasma Glucose: 120 mg/dL — ABNORMAL HIGH (ref <117)

## 2013-11-06 LAB — GLUCOSE, CAPILLARY
Glucose-Capillary: 95 mg/dL (ref 70–99)
Glucose-Capillary: 106 mg/dL — ABNORMAL HIGH (ref 70–99)

## 2013-11-06 NOTE — Progress Notes (Signed)
Discussed TEE with patient. She is aware of the procedure and has no questions about it. Scheduled for 9:00 am 08/26 with Dr. Meda Coffee, orders written.

## 2013-11-06 NOTE — Consult Note (Signed)
ELECTROPHYSIOLOGY CONSULT NOTE  Patient ID: Felicia Hunter MRN: [id number], DOB/AGE: 07-29-1940   Admit date: 11/05/2013 Date of Consult: 11/06/2013  Primary Physician: Horatio Pel, MD Primary Cardiologist: new to [facility] Reason for Consultation: Cryptogenic stroke; recommendations regarding Implantable Loop Recorder  History of Present Illness Felicia Hunter was admitted on 11/05/2013 with expressive aphasia.  Imaging demonstrated left insuluar cortical infarct.  she has undergone workup for stroke including echocardiogram and carotid dopplers.  The patient has been monitored on telemetry which has demonstrated sinus rhythm with no arrhythmias.  Inpatient stroke work-up is to be completed with a TEE.   Echocardiogram this admission demonstrated EF 55-60%, no RWMA, lipomatous hypertrophy of the septum, LA 40.  Lab work is reviewed  Prior to admission, the patient denies chest pain, shortness of breath, dizziness, palpitations, or syncope.    EP has been asked to evaluate for placement of an implantable loop recorder to monitor for atrial fibrillation.  ROS is negative except as outlined above.    Past Medical History  Diagnosis Date  . GERD (gastroesophageal reflux disease)   . Arthritis   . Cough     PERSISTANT COUGH X 20 YRS  . Tubulovillous adenoma of colon 08/2003  . Diverticulosis   . Hyperlipidemia   . Osteopenia   . Alopecia   . MVP (mitral valve prolapse)   . Clavicle fracture   . Disorders of bilirubin excretion   . Stroke 06/07/11    NO RESIDUAL PROBLEMS  . Difficulty sleeping     takes Ambien  . Breast cancer      Surgical History:  Past Surgical History  Procedure Laterality Date  . Collarbone  september 2011    pinned and plated, right  . Abdominal hysterectomy  1993  . Vein surgery  1974    left leg  . Nasal septum surgery    . Tonsillectomy    . Joint replacement  july 9th, 2012    left knee replacement   . Knee reconstruction   7/12    revision lt total knee  . Breast enhancement surgery      x3-then removed 2011 for bilat mastectomies  . Chest wall tumor excision  5/12    noduals removed-neg  . Breast capsulotomy      after bilat mastectomies-implants removed  . Breast surgery  june 2011    double mastectomy nodes from rt  . Mass excision Left 06/19/2012    Procedure: EXCISION OF LARGE MASS LEFT NECK/SHOULDER WITH PLASTIC CLOSURE;  Surgeon: Cristine Polio, MD;  Location: [facility];  Service: Plastics;  Laterality: Left;  . Total hip arthroplasty Right 04/10/2013    Procedure: RIGHT TOTAL HIP ARTHROPLASTY ANTERIOR APPROACH;  Surgeon: [name], MD;  Location: [facility];  Service: Orthopedics;  Laterality: Right;     Prescriptions prior to admission  Medication Sig Dispense Refill  . aspirin 325 MG tablet Take 325 mg by mouth daily.      Marland Kitchen atorvastatin (LIPITOR) 20 MG tablet Take 20 mg by mouth every evening.       Marland Kitchen BIOTIN PO Take 1 tablet by mouth daily.      . calcium carbonate (CALCIUM 600) 600 MG TABS Take 600 mg by mouth 2 (two) times daily with a meal.       . cholecalciferol (VITAMIN D) 1000 UNITS tablet Take 1,000 Units by mouth daily.      Marland Kitchen glucosamine-chondroitin 500-400 MG tablet Take 1 tablet by  mouth 2 (two) times daily.      . Multiple Vitamins-Minerals (MULTIVITAMIN & MINERAL PO) Take by mouth every morning.      Marland Kitchen omega-3 acid ethyl esters (LOVAZA) 1 G capsule Take 1 g by mouth 2 (two) times daily.       Marland Kitchen zolpidem (AMBIEN) 10 MG tablet Take 10 mg by mouth at bedtime as needed. sleep        Inpatient Medications:  . aspirin  300 mg Rectal Daily   Or  . aspirin  325 mg Oral Daily  . atorvastatin  20 mg Oral QPM  . cholecalciferol  1,000 Units Oral Daily  . omega-3 acid ethyl esters  1 g Oral BID    Allergies:  Allergies  Allergen Reactions  . Sulfonamide Derivatives Rash    History   Social History  . Marital Status: Widowed    Spouse Name: N/A    Number of  Children: 2  . Years of Education: N/A   Occupational History  . retired    Social History Main Topics  . Smoking status: Never Smoker   . Smokeless tobacco: Never Used  . Alcohol Use: 0.6 oz/week    1 Glasses of wine per week     Comment: 1 glass of wine per night  . Drug Use: No  . Sexual Activity: Not on file   Other Topics Concern  . Not on file   Social History Narrative  . No narrative on file     Family History  Problem Relation Age of Onset  . Aneurysm Father     BP 136/78  Pulse 68  Temp(Src) 98.4 F (36.9 C) (Oral)  Resp 16  Ht 5\' 5"  (1.651 m)  Wt 135 lb 8 oz (61.462 kg)  BMI 22.55 kg/m2  SpO2 94%  Physical Exam: Well appearing 73 yo woman, NAD HEENT: Unremarkable,Staves, AT Neck:  6 JVD, no thyromegally Back:  No CVA tenderness Lungs:  Clear with no wheezes, rales, or rhonchi HEART:  Regular rate rhythm, no murmurs, no rubs, no clicks Abd:  soft, positive bowel sounds, no organomegally, no rebound, no guarding Ext:  2 plus pulses, no edema, no cyanosis, no clubbing Skin:  No rashes no nodules Neuro:  CN II through XII intact, motor grossly intact   Labs:   Lab Results  Component Value Date   WBC 5.7 11/05/2013   HGB 14.4 11/05/2013   HCT 43.7 11/05/2013   MCV 95.0 11/05/2013   PLT 234 11/05/2013    Recent Labs Lab 11/05/13 1115  NA 139  K 4.1  CL 103  CO2 22  BUN 12  CREATININE 0.73  CALCIUM 10.2  PROT 7.1  BILITOT 1.0  ALKPHOS 70  ALT 19  AST 22  GLUCOSE 114*     Radiology/Studies: Dg Chest 2 View 11/05/2013   CLINICAL DATA:  Stroke.  History of breast cancer.  EXAM: CHEST  2 VIEW  COMPARISON:  Chest radiograph 12/26/2009  FINDINGS: Cardiac leads project over the chest. Heart size within normal limits. The heart can't mediastinal contours are stable. Trachea is midline. The lungs are clear. Negative for pleural effusion. Negative for pneumothorax. Postoperative changes of the distal right clavicle. There are degenerative changes of the  right shoulder. Mild S-shaped thoracolumbar scoliosis. Suspect bilateral mastectomies.  IMPRESSION: No active cardiopulmonary disease.  Degenerative changes of the right shoulder and postoperative changes of the distal right clavicle.   Electronically Signed   By: Audelia Acton.D.  On: 11/05/2013 21:46   Ct Head (brain) Wo Contrast 11/05/2013   CLINICAL DATA:  A aphasia and speech difficulty.  EXAM: CT HEAD WITHOUT CONTRAST  TECHNIQUE: Contiguous axial images were obtained from the base of the skull through the vertex without intravenous contrast.  COMPARISON:  Noncontrast CT scan of the brain of June 07, 2011  FINDINGS: The ventricles are normal in size and position. There are areas of hypodensity in the mid parietal cortex on the right which is likely chronic. On the left there is subtle hypodensity worrisome for acute ischemia which crosses the gray and white matter. There is no acute intracranial hemorrhage. There are old lacunar infarctions in the left cerebellar hemisphere. The observed paranasal sinuses and mastoid air cells are clear. There is no skull fracture.  IMPRESSION: 1. Acute ischemic change in the left parietal lobe is present consistent with an evolving ischemic infarction. Encephalomalacia in the right parietal lobe likely reflects previous ischemic insult. 2. There are old infarcts in the left cerebellar hemisphere. 3. Next item there is no acute intracranial hemorrhage.   Electronically Signed   By: David  Martinique   On: 11/05/2013 11:53   Mr Jodene Nam Head Wo Contrast 11/05/2013   CLINICAL DATA:  Aphasia  EXAM: MRI HEAD WITHOUT CONTRAST  MRA HEAD WITHOUT CONTRAST  TECHNIQUE: Multiplanar, multiecho pulse sequences of the brain and surrounding structures were obtained without intravenous contrast. Angiographic images of the head were obtained using MRA technique without contrast.  COMPARISON:  Head CT same day.  MRI 06/08/2011.  FINDINGS: MRI HEAD FINDINGS  Diffusion imaging shows a 4 cm region of  acute infarction affecting the left insula and frontoparietal cortical and subcortical brain. This consistent with left MCA branch vessel infarction. There is mild swelling but no hemorrhage or mass effect. No other area of acute infarction.  The brainstem is normal. There are old cerebellar infarctions, most notable inferiorly on the left. The cerebral hemispheres otherwise show an old cortical and subcortical infarction in the right parietal region and mild chronic small-vessel change of the deep white matter. No mass lesion, hemorrhage, hydrocephalus or extra-axial collection. No pituitary mass. No inflammatory sinus disease. No skull or skullbase lesion.  MRA HEAD FINDINGS  Both internal carotid arteries are widely patent into the brain. The anterior and middle cerebral vessels are patent without proximal stenosis, aneurysm or vascular malformation. There is narrowing of a left MCA branch probably serving the region of infarction, presumably due to embolic disease.  Both vertebral arteries are widely patent to the basilar. No basilar stenosis. Posterior circulation branch vessels are patent.  IMPRESSION: Acute infarction affecting the left insula and frontoparietal cortical and subcortical brain consistent with left MCA branch vessel territory infarction. Mild swelling but no hemorrhage or mass effect.  Old ischemic changes elsewhere throughout the brain as outlined above.  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   Electronically Signed   By: Nelson Chimes M.D.   On: 11/05/2013 14:12   12-lead ECG sinus rhythm, rate 82, normal intervals; all previous EKG's available reviewed - no atrial fibrillation  Telemetry - nsr   Assessment and Plan 1.Cryptogenic stroke (multiple) 2. No cardiac source of thrombus by TEE Rec: the indications for ILR insertion have been discussed with the patient and she wishes to proceed.  Mikle Bosworth.D.

## 2013-11-06 NOTE — Progress Notes (Signed)
STROKE TEAM PROGRESS NOTE   HISTORY Felicia Hunter is an 73 y.o. female with previous CVA in 2013 and no residual symptoms. Patient was by herself yesterday 11/04/2013 when around 4PM she noted she was having a hard time starting her car and some expressive difficulties. Her daughter noted she had some word substitution and told her to called her PCP. She called her primary MD today who stated she should be seen in ED. Currently she feels she is much improved with only slight difficulty finding words. She takes ASA 325 daily and has not missed a dose. Patient was not administered TPA secondary to delay in arrival. She was admitted for further evaluation and treatment.   SUBJECTIVE (INTERVAL HISTORY) Her daughter is on the speaker phone at the bedside.  Overall she feels her condition is gradually improving.    OBJECTIVE Temp:  [97.3 F (36.3 C)-99 F (37.2 C)] 98.4 F (36.9 C) (08/25 0943) Pulse Rate:  [58-82] 74 (08/25 0943) Cardiac Rhythm:  [-] Normal sinus rhythm (08/25 0000) Resp:  [14-22] 16 (08/25 0943) BP: (109-166)/(61-109) 118/66 mmHg (08/25 0943) SpO2:  [93 %-98 %] 95 % (08/25 0943) Weight:  [61.462 kg (135 lb 8 oz)] 61.462 kg (135 lb 8 oz) (08/24 1650)   Recent Labs Lab 11/05/13 1049 11/05/13 1842 11/05/13 2231  GLUCAP 133* 165* 95    Recent Labs Lab 11/05/13 1115  NA 139  K 4.1  CL 103  CO2 22  GLUCOSE 114*  BUN 12  CREATININE 0.73  CALCIUM 10.2    Recent Labs Lab 11/05/13 1115  AST 22  ALT 19  ALKPHOS 70  BILITOT 1.0  PROT 7.1  ALBUMIN 4.0    Recent Labs Lab 11/05/13 1115  WBC 5.7  NEUTROABS 4.2  HGB 14.4  HCT 43.7  MCV 95.0  PLT 234     Recent Labs  11/05/13 1115  LABPROT 13.6  INR 1.04        Component Value Date/Time   CHOL 159 11/06/2013 0530   TRIG 69 11/06/2013 0530   HDL 88 11/06/2013 0530   CHOLHDL 1.8 11/06/2013 0530   VLDL 14 11/06/2013 0530   LDLCALC 57 11/06/2013 0530   Lab Results  Component Value Date   HGBA1C  5.8* 06/07/2011    Dg Chest 2 View  11/05/2013   CLINICAL DATA:  Stroke.  History of breast cancer.  EXAM: CHEST  2 VIEW  COMPARISON:  Chest radiograph 12/26/2009  FINDINGS: Cardiac leads project over the chest. Heart size within normal limits. The heart can't mediastinal contours are stable. Trachea is midline. The lungs are clear. Negative for pleural effusion. Negative for pneumothorax. Postoperative changes of the distal right clavicle. There are degenerative changes of the right shoulder. Mild S-shaped thoracolumbar scoliosis. Suspect bilateral mastectomies.  IMPRESSION: No active cardiopulmonary disease.  Degenerative changes of the right shoulder and postoperative changes of the distal right clavicle.   Electronically Signed   By: Curlene Dolphin M.D.   On: 11/05/2013 21:46   Ct Head (brain) Wo Contrast  11/05/2013   CLINICAL DATA:  A aphasia and speech difficulty.  EXAM: CT HEAD WITHOUT CONTRAST  TECHNIQUE: Contiguous axial images were obtained from the base of the skull through the vertex without intravenous contrast.  COMPARISON:  Noncontrast CT scan of the brain of June 07, 2011  FINDINGS: The ventricles are normal in size and position. There are areas of hypodensity in the mid parietal cortex on the right which is likely chronic. On  the left there is subtle hypodensity worrisome for acute ischemia which crosses the gray and white matter. There is no acute intracranial hemorrhage. There are old lacunar infarctions in the left cerebellar hemisphere. The observed paranasal sinuses and mastoid air cells are clear. There is no skull fracture.  IMPRESSION: 1. Acute ischemic change in the left parietal lobe is present consistent with an evolving ischemic infarction. Encephalomalacia in the right parietal lobe likely reflects previous ischemic insult. 2. There are old infarcts in the left cerebellar hemisphere. 3. Next item there is no acute intracranial hemorrhage.   Electronically Signed   By: David   Martinique   On: 11/05/2013 11:53   Mr Jodene Nam Head Wo Contrast  11/05/2013   CLINICAL DATA:  Aphasia  EXAM: MRI HEAD WITHOUT CONTRAST  MRA HEAD WITHOUT CONTRAST  TECHNIQUE: Multiplanar, multiecho pulse sequences of the brain and surrounding structures were obtained without intravenous contrast. Angiographic images of the head were obtained using MRA technique without contrast.  COMPARISON:  Head CT same day.  MRI 06/08/2011.  FINDINGS: MRI HEAD FINDINGS  Diffusion imaging shows a 4 cm region of acute infarction affecting the left insula and frontoparietal cortical and subcortical brain. This consistent with left MCA branch vessel infarction. There is mild swelling but no hemorrhage or mass effect. No other area of acute infarction.  The brainstem is normal. There are old cerebellar infarctions, most notable inferiorly on the left. The cerebral hemispheres otherwise show an old cortical and subcortical infarction in the right parietal region and mild chronic small-vessel change of the deep white matter. No mass lesion, hemorrhage, hydrocephalus or extra-axial collection. No pituitary mass. No inflammatory sinus disease. No skull or skullbase lesion.  MRA HEAD FINDINGS  Both internal carotid arteries are widely patent into the brain. The anterior and middle cerebral vessels are patent without proximal stenosis, aneurysm or vascular malformation. There is narrowing of a left MCA branch probably serving the region of infarction, presumably due to embolic disease.  Both vertebral arteries are widely patent to the basilar. No basilar stenosis. Posterior circulation branch vessels are patent.  IMPRESSION: Acute infarction affecting the left insula and frontoparietal cortical and subcortical brain consistent with left MCA branch vessel territory infarction. Mild swelling but no hemorrhage or mass effect.  Old ischemic changes elsewhere throughout the brain as outlined above.  Stenosis of left MCA branch vessel serving the  region of infarction, probably due to embolic disease.   Electronically Signed   By: Nelson Chimes M.D.   On: 11/05/2013 14:12   Mr Brain Wo Contrast  11/05/2013   CLINICAL DATA:  Aphasia  EXAM: MRI HEAD WITHOUT CONTRAST  MRA HEAD WITHOUT CONTRAST  TECHNIQUE: Multiplanar, multiecho pulse sequences of the brain and surrounding structures were obtained without intravenous contrast. Angiographic images of the head were obtained using MRA technique without contrast.  COMPARISON:  Head CT same day.  MRI 06/08/2011.  FINDINGS: MRI HEAD FINDINGS  Diffusion imaging shows a 4 cm region of acute infarction affecting the left insula and frontoparietal cortical and subcortical brain. This consistent with left MCA branch vessel infarction. There is mild swelling but no hemorrhage or mass effect. No other area of acute infarction.  The brainstem is normal. There are old cerebellar infarctions, most notable inferiorly on the left. The cerebral hemispheres otherwise show an old cortical and subcortical infarction in the right parietal region and mild chronic small-vessel change of the deep white matter. No mass lesion, hemorrhage, hydrocephalus or extra-axial collection. No pituitary mass.  No inflammatory sinus disease. No skull or skullbase lesion.  MRA HEAD FINDINGS  Both internal carotid arteries are widely patent into the brain. The anterior and middle cerebral vessels are patent without proximal stenosis, aneurysm or vascular malformation. There is narrowing of a left MCA branch probably serving the region of infarction, presumably due to embolic disease.  Both vertebral arteries are widely patent to the basilar. No basilar stenosis. Posterior circulation branch vessels are patent.  IMPRESSION: Acute infarction affecting the left insula and frontoparietal cortical and subcortical brain consistent with left MCA branch vessel territory infarction. Mild swelling but no hemorrhage or mass effect.  Old ischemic changes elsewhere  throughout the brain as outlined above.  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   Electronically Signed   By: Nelson Chimes M.D.   On: 11/05/2013 14:12    PHYSICAL EXAM Pleasant elderly Caucasian lady not in distress.Awake alert. Afebrile. Head is nontraumatic. Neck is supple without bruit. Hearing is normal. Cardiac exam no murmur or gallop. Lungs are clear to auscultation. Distal pulses are well felt. Neurological Exam : Awake alert oriented x3. Mild nonfluent speech and expressive language difficulties with word hesitation. Good naming, comprehension and repetition. No paraphasic errors. Extraocular movements are full range without nystagmus. Blinks to threat bilaterally. Fundi were not visualized. Vision acuity seems adequate. Face is symmetric without weakness. Tongue is midline. Motor system exam reveals no upper or lower extremity drift. No focal weakness. Deep tendon pulses are 1+ symmetric. Plantars are downgoing. Sensation is intact. Coordination is accurate. Gait was not tested. ASSESSMENT/PLAN :  Felicia Hunter is a 73 y.o. female with hx of previous stroke presenting with expressive aphasia. She did not receive IV t-PA due to delay in arrival. Imaging confirms a left insular cortical and subcoritcal infarct.etiology likely embolic Stroke work up underway.  Stroke:  left insular cortical and subcoritcal infarct embolic secondary to unknown etiology     aspirin 325 mg orally every day prior to admission, now on aspirin 325 mg orally every day  MRI left insular cortical and subcoritcal infarct  MRA Stenosis of left MCA branch vessel   2D Echo  pending   Carotid pending TEE to look for embolic source. Arranged with [facility] for tomorrow.  If positive for PFO (patent foramen ovale), check bilateral lower extremity venous dopplers to rule out DVT as possible source of stroke. (I have made patient  NPO after midnight tonight). If TEE negative, a [place] electrophysiologist will consult and consider placement of an implantable loop recorder to evaluate for atrial fibrillation as etiology of stroke. This has been explained to patient/family by Leonie Man and they are agreeable.   HgbA1c pending  SCDs for VTE prophylaxis  Cardiac thin liquids.   OOB with assistance  Resultant mild expressive aphasia  Therapy needs:  pending  Disposition:  pending  Hyperlipidemia  LDL 57   Patient on lipitor 20 mg and omega 3 daily at home, resumed in hospital  At LDL goal < 100   Other Stroke Risk Factors Advanced age   Hx stroke/TIA March 2013, patchy left cerebellar infarcts felt to be due to small vessel disease given location. Placed on aspirin.    etoh use  Hospital day # 1  [facility] BIBY, MSN, RN, ANVP-BC, ANP-BC, [facility] Pager: [phone] 11/06/2013 4:58 PM  I have personally examined this patient, reviewed notes, independently  viewed imaging studies, participated in medical decision making and plan of care. I have made any additions or clarifications directly to the above note. Agree with note above. I had a 15 minute discussion with the patient and her daughter regarding her stroke symptoms, my findings on exam, personally viewed imaging studies then discussed plan for further evaluation, treatment and answered questions. She is at significant risk for recurrent strokes and neurological worsening and needs risk stratification workup and treatment Antony Contras, MD Medical Director [facility] Pager: [phone] 11/06/2013 6:13 PM    To contact Stroke Continuity provider, please refer to [email or web address] After hours, contact General Neurology

## 2013-11-06 NOTE — Progress Notes (Signed)
  Echocardiogram 2D Echocardiogram has been performed.  Felicia Hunter 11/06/2013, 1:59 PM

## 2013-11-06 NOTE — Progress Notes (Signed)
Utilization Review Completed.Felicia Hunter T8/25/2015  

## 2013-11-06 NOTE — Evaluation (Signed)
Physical Therapy Evaluation/Discharge Patient Details Name: Felicia Hunter MRN: [id number] DOB: 04/23/1940 Today's Date: 11/06/2013   History of Present Illness  73 y.o. female admitted to [facility] on 11/05/13 with slurred speech, and "just not feeling right".  Pt admitted for stroke workup and found by MRI to have a L MCA branch infarct.  Pt with significant PMhx of MVP, osteopenia, stroke, breast CA (s/p double mastectomy), R hip replacement, and L knee replacement.  Clinical Impression  Pt is independent with all mobility.  She does not have any obvious lingering impairments.  PT will sign off.  Stroke education completed FAST.  Pt has no further acute or f/u PT needed at this time.      Follow Up Recommendations No PT follow up    Equipment Recommendations  None recommended by PT    Recommendations for Other Services   NA    Precautions / Restrictions Precautions Precautions: None      Mobility  Bed Mobility Overal bed mobility: Independent                Transfers Overall transfer level: Independent Equipment used: None                Ambulation/Gait Ambulation/Gait assistance: Independent Ambulation Distance (Feet): 250 Feet Assistive device: None Gait Pattern/deviations: WFL(Within Functional Limits)   Gait velocity interpretation: at or above normal speed for age/gender    Stairs Stairs: Yes Stairs assistance: Modified independent (Device/Increase time) Stair Management: One rail Right;Alternating pattern;Forwards Number of Stairs: 9        Modified Rankin (Stroke Patients Only) Modified Rankin (Stroke Patients Only) Pre-Morbid Rankin Score: No symptoms Modified Rankin: No symptoms     Balance Overall balance assessment: Independent (360 degree turn, object picl up from floor, walking head tur)                                           Pertinent Vitals/Pain Pain Assessment: No/denies pain    Home Living Family/patient  expects to be discharged to:: Private residence Living Arrangements: Alone Available Help at Discharge: Family;Available PRN/intermittently (daughter lives nearby) Type of Home: House Home Access: Stairs to enter   [employer] of Steps: 2-3 Home Layout: Two level;Able to live on main level with bedroom/bathroom Home Equipment: Gilford Rile - 2 wheels;Cane - single point;Shower seat - built in;Bedside commode Additional Comments: wears bifocals (progressives) all the time.  She actually admitts to taking them off to read (she has an appointment to get her eyes checked in No[place] of this year).     Prior Function Level of Independence: Independent         Comments: drives, [facility] [employer] 3-4 times per [facility], is active at church.       Hand Dominance   Dominant Hand: Left    Extremity/Trunk Assessment   Upper Extremity Assessment: Overall WFL for tasks assessed           Lower Extremity Assessment: Overall WFL for tasks assessed (strength, coordination, sensation all WNL)      Cervical / Trunk Assessment: Normal  Communication   Communication: No difficulties (no obvious difficulties)  Cognition Arousal/Alertness: Awake/alert Behavior During Therapy: WFL for tasks assessed/performed Overall Cognitive Status: Within Functional Limits for tasks assessed                      General  Comments General comments (skin integrity, edema, etc.): Stroke education completed with emphasis on getting to the ED quickly when symptoms arise as pt went to bed to try to "sleep it off"          Assessment/Plan    PT Assessment Patent does not need any further PT services           PT Goals (Current goals can be found in the Care Plan section) Acute Rehab PT Goals PT Goal Formulation: No goals set, d/c therapy     End of Session Equipment Utilized During Treatment: Gait belt Activity Tolerance: Patient tolerated treatment well Patient left: with call bell/phone within  reach;in bed;with family/visitor present (seated EOB)           Time: 1224-4975 PT Time Calculation (min): 13 min   Charges:   PT Evaluation $Initial PT Evaluation Tier I: 1 Procedure          Arthelia Callicott B. [place], [place], DPT [phone]   11/06/2013, 12:34 PM

## 2013-11-06 NOTE — Progress Notes (Addendum)
Patient ID: Felicia Hunter, female   DOB: 1940-08-21, 73 y.o.   MRN: [id number] [facility] PROGRESS NOTE  Felicia Hunter [id number] DOB: Jul 23, 1940 DOA: 11/05/2013 PCP: Horatio Pel, MD  Brief narrative: 73 y.o. female with previous CVA in 2013 and no residual symptoms who presented to [facility] ED 11/05/2013 with expressive aphasia.  Imaging confirmed a left insular cortical and subcoritcal infarct.etiology likely embolic  Pt was not a candidate for t-PA due to delay in arrival. Other imaging studies included CXR which did not show acute cardiopulmonary disease.  Assessment/Plan:    Principal Problem:   CVA in left insular cortical and subcortical area  Stroke felt to be embolic secondary to unknown etiology  Per neurology, we will proceed with TEE to look for embolic source. Plan for TEE in am.  Continue aspirin daily.  2 D ECHO showed EF 55%.  Carotid doppler pending, PT/OT evaluation - pending.   A1c is 5.8 on this admission.  Neurology is following. Active Problems: Hyperlipidemia   LDL 57. LDL goal < 100   Patient on lipitor 20 mg and omega 3 daily at home, resumed in hospital  DVT Prophylaxis   SCD's bilaterally    Code Status: Full.  Family Communication:  Family not at the bedside this am Disposition Plan: remains inpatient    IV Access:   Peripheral IV Procedures and diagnostic studies:    Dg Chest 2 View 11/05/2013   No active cardiopulmonary disease.  Degenerative changes of the right shoulder and postoperative changes of the distal right clavicle.     Ct Head (brain) Wo Contrast 11/05/2013  1. Acute ischemic change in the left parietal lobe is present consistent with an evolving ischemic infarction. Encephalomalacia in the right parietal lobe likely reflects previous ischemic insult. 2. There are old infarcts in the left cerebellar hemisphere. 3. Next item there is no acute intracranial hemorrhage.    Mr Virgel Paling Wo Contrast 11/05/2013   Acute  infarction affecting the left insula and frontoparietal cortical and subcortical brain consistent with left MCA branch vessel territory infarction. Mild swelling but no hemorrhage or mass effect.  Old ischemic changes elsewhere throughout the brain as outlined above.  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     Mr Brain Wo Contrast  11/05/2013   Acute infarction affecting the left insula and frontoparietal cortical and subcortical brain consistent with left MCA branch vessel territory infarction. Mild swelling but no hemorrhage or mass effect.  Old ischemic changes elsewhere throughout the brain as outlined above.  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     Medical Consultants:   Neurology Cardiology for TEE Other Consultants:   PT/OT Anti-Infectives:   None    Leisa Lenz, MD  [facility] Pager [phone]  If 7PM-7AM, please contact night-coverage [email or web address] Password [email or web address] 11/06/2013, 8:04 PM   LOS: 1 day    HPI/Subjective: No acute overnight events.  Objective: Filed Vitals:   11/06/13 0600 11/06/13 0943 11/06/13 1442 11/06/13 1835  BP: 132/61 118/66 136/78 129/67  Pulse: 58 74 68 63  Temp:  98.4 F (36.9 C) 98.4 F (36.9 C) 98.2 F (36.8 C)  TempSrc:  Oral Oral Oral  Resp: 16 16 16 16   Height:      Weight:      SpO2: 94% 95% 94% 96%   No intake or output data in the 24 hours  ending 11/06/13 2004  Exam:   General:  Pt is alert, follows commands appropriately, not in acute distress  Cardiovascular: Regular rate and rhythm, S1/S2, no murmurs  Respiratory: Clear to auscultation bilaterally, no wheezing, no crackles, no rhonchi  Abdomen: Soft, non tender, non distended, bowel sounds present  Extremities: No edema, pulses DP and PT palpable bilaterally  Neuro: mild word finding difficulty otherwise unremarkable.  Data  Reviewed: Basic Metabolic Panel:  Recent Labs Lab 11/05/13 1115  NA 139  K 4.1  CL 103  CO2 22  GLUCOSE 114*  BUN 12  CREATININE 0.73  CALCIUM 10.2   Liver Function Tests:  Recent Labs Lab 11/05/13 1115  AST 22  ALT 19  ALKPHOS 70  BILITOT 1.0  PROT 7.1  ALBUMIN 4.0   No results found for this basename: LIPASE, AMYLASE,  in the last 168 hours No results found for this basename: AMMONIA,  in the last 168 hours CBC:  Recent Labs Lab 11/05/13 1115  WBC 5.7  NEUTROABS 4.2  HGB 14.4  HCT 43.7  MCV 95.0  PLT 234   Cardiac Enzymes: No results found for this basename: CKTOTAL, CKMB, CKMBINDEX, TROPONINI,  in the last 168 hours BNP: No components found with this basename: POCBNP,  CBG:  Recent Labs Lab 11/05/13 1049 11/05/13 1842 11/05/13 2231  GLUCAP 133* 165* 95    MRSA PCR SCREENING     Status: None   Collection Time    11/05/13  5:20 PM      Result Value Ref Range Status   MRSA by PCR NEGATIVE  NEGATIVE Final     Scheduled Meds: . aspirin  300 mg Rectal Daily   Or  . aspirin  325 mg Oral Daily  . atorvastatin  20 mg Oral QPM  . cholecalciferol  1,000 Units Oral Daily  . omega-3 acid ethyl esters  1 g Oral BID

## 2013-11-07 ENCOUNTER — Encounter (HOSPITAL_COMMUNITY)
Admission: EM | Disposition: A | Discharge status: Home / Self Care | Payer: Self-pay | Source: Home / Self Care | Attending: Internal Medicine

## 2013-11-07 ENCOUNTER — Encounter (HOSPITAL_COMMUNITY): Payer: Self-pay

## 2013-11-07 DIAGNOSIS — I6350 Cerebral infarction due to unspecified occlusion or stenosis of unspecified cerebral artery: Secondary | ICD-10-CM

## 2013-11-07 DIAGNOSIS — I059 Rheumatic mitral valve disease, unspecified: Secondary | ICD-10-CM

## 2013-11-07 HISTORY — PX: LOOP RECORDER IMPLANT: SHX5477

## 2013-11-07 HISTORY — PX: TEE WITHOUT CARDIOVERSION: SHX5443

## 2013-11-07 LAB — GLUCOSE, CAPILLARY
Glucose-Capillary: 99 mg/dL (ref 70–99)
Glucose-Capillary: 145 mg/dL — ABNORMAL HIGH (ref 70–99)
Glucose-Capillary: 109 mg/dL — ABNORMAL HIGH (ref 70–99)

## 2013-11-07 SURGERY — LOOP RECORDER IMPLANT
Anesthesia: LOCAL

## 2013-11-07 SURGERY — ECHOCARDIOGRAM, TRANSESOPHAGEAL
Anesthesia: Moderate Sedation

## 2013-11-07 MED ORDER — BUTAMBEN-TETRACAINE-BENZOCAINE 2-2-14 % EX AERO
INHALATION_SPRAY | CUTANEOUS | Status: DC | PRN
  Administered 2013-11-07: 2 via TOPICAL

## 2013-11-07 MED ORDER — LIDOCAINE-EPINEPHRINE 1 %-1:100000 IJ SOLN
INTRAMUSCULAR | Status: AC
  Filled 2013-11-07: qty 1

## 2013-11-07 MED ORDER — FENTANYL CITRATE 0.05 MG/ML IJ SOLN
INTRAMUSCULAR | Status: AC
  Filled 2013-11-07: qty 2

## 2013-11-07 MED ORDER — MIDAZOLAM HCL 10 MG/2ML IJ SOLN
INTRAMUSCULAR | Status: DC | PRN
  Administered 2013-11-07: 2 mg via INTRAVENOUS
  Administered 2013-11-07 (×2): 1 mg via INTRAVENOUS

## 2013-11-07 MED ORDER — SODIUM CHLORIDE 0.9 % IV SOLN
INTRAVENOUS | Status: DC
  Administered 2013-11-07: 08:00:00 via INTRAVENOUS

## 2013-11-07 MED ORDER — FENTANYL CITRATE 0.05 MG/ML IJ SOLN
INTRAMUSCULAR | Status: DC | PRN
  Administered 2013-11-07 (×2): 25 ug via INTRAVENOUS

## 2013-11-07 MED ORDER — MIDAZOLAM HCL 5 MG/ML IJ SOLN
INTRAMUSCULAR | Status: AC
  Filled 2013-11-07: qty 2

## 2013-11-07 NOTE — Progress Notes (Signed)
Pt A&O x4; pt discharge education and instructions completed with pt at bedside. Pt voices understanding and denies any questions; pt IV and telemetry removed; pt provided handout information on stroke; pt loop recorder incision remains slightly stained but intact; pt denies any pain or discomfort; pt voices watching the video on loop recorder implant before and after; pt waiting on her family to pick her up to go home. Will continue to monitor pt till picked up; call light within reach. Francis Gaines Jamara Vary RN.

## 2013-11-07 NOTE — Interval H&P Note (Signed)
History and Physical Interval Note:  11/07/2013 8:58 AM  Felicia Hunter  has presented today for surgery, with the diagnosis of STROKE  The various methods of treatment have been discussed with the patient and family. After consideration of risks, benefits and other options for treatment, the patient has consented to  Procedure(s): TRANSESOPHAGEAL ECHOCARDIOGRAM (TEE) (N/A) as a surgical intervention .  The patient's history has been reviewed, patient examined, no change in status, stable for surgery.  I have reviewed the patient's chart and labs.  Questions were answered to the patient's satisfaction.     Dorothy Spark

## 2013-11-07 NOTE — CV Procedure (Signed)
Electrophysiology procedure note  Procedure: Insertion of an implantable loop recorder  Preoperative diagnosis: Cryptogenic stroke Postoperative diagnosis: Cryptogenic stroke  Description of the procedure: After informed consent was obtained, the patient was prepped and draped in the usual manner. 20 cc of lidocaine was infiltrated into the left pectoral region. A 1 cm stab incision was made. A Medtronic implantable loop recorder, serial [id number] was inserted. The R waves measure 0.5 mV. Benzoin and Steri-Strips for pain on the skin. A bandage was placed. The patient was returned to her room in satisfactory condition.  Complications: None  Conclusion: Successful insertion of a Medtronic implantable loop recorder in a patient with unexplained cryptogenic stroke  Cristopher Peru, M.D.

## 2013-11-07 NOTE — Progress Notes (Signed)
Pt transported off unit via wheelchair with belongings to the side; pt discharge home with daughters to transport her home. Francis Gaines Carliyah Cotterman RN.

## 2013-11-07 NOTE — Progress Notes (Signed)
STROKE TEAM PROGRESS NOTE   HISTORY Felicia Hunter is an 73 y.o. female with previous CVA in 2013 and no residual symptoms. Patient was by herself yesterday 11/04/2013 when around 4PM she noted she was having a hard time starting her car and some expressive difficulties. Her daughter noted she had some word substitution and told her to called her PCP. She called her primary MD today who stated she should be seen in ED. Currently she feels she is much improved with only slight difficulty finding words. She takes ASA 325 daily and has not missed a dose. Patient was not administered TPA secondary to delay in arrival. She was admitted for further evaluation and treatment.   SUBJECTIVE (INTERVAL HISTORY) No family present. She reports she just got back from her TEE and it "was not so bad". Was able to read the newspaper out oud without difficulty to Dr. Leonie Man.   OBJECTIVE Temp:  [97.2 F (36.2 C)-98.4 F (36.9 C)] 97.3 F (36.3 C) (08/26 1122) Pulse Rate:  [56-79] 56 (08/26 1122) Cardiac Rhythm:  [-] Normal sinus rhythm;Sinus bradycardia (08/25 2100) Resp:  [12-29] 18 (08/26 1122) BP: (103-151)/(64-115) 115/64 mmHg (08/26 1122) SpO2:  [91 %-98 %] 97 % (08/26 1122)   Recent Labs Lab 11/05/13 1049 11/05/13 1842 11/05/13 2231 11/06/13 2240 11/07/13 0657  GLUCAP 133* 165* 95 106* 99    Recent Labs Lab 11/05/13 1115  NA 139  K 4.1  CL 103  CO2 22  GLUCOSE 114*  BUN 12  CREATININE 0.73  CALCIUM 10.2    Recent Labs Lab 11/05/13 1115  AST 22  ALT 19  ALKPHOS 70  BILITOT 1.0  PROT 7.1  ALBUMIN 4.0    Recent Labs Lab 11/05/13 1115  WBC 5.7  NEUTROABS 4.2  HGB 14.4  HCT 43.7  MCV 95.0  PLT 234     Recent Labs  11/05/13 1115  LABPROT 13.6  INR 1.04        Component Value Date/Time   CHOL 159 11/06/2013 0530   TRIG 69 11/06/2013 0530   HDL 88 11/06/2013 0530   CHOLHDL 1.8 11/06/2013 0530   VLDL 14 11/06/2013 0530   LDLCALC 57 11/06/2013 0530   Lab Results   Component Value Date   HGBA1C 5.8* 11/06/2013    No results found.  PHYSICAL EXAM Pleasant elderly Caucasian lady not in distress.Awake alert. Afebrile. Head is nontraumatic. Neck is supple without bruit. Hearing is normal. Cardiac exam no murmur or gallop. Lungs are clear to auscultation. Distal pulses are well felt. Neurological Exam : Awake alert oriented x3. Mild nonfluent speech and expressive language difficulties with word hesitation. Good naming, comprehension and repetition. No paraphasic errors. Extraocular movements are full range without nystagmus. Blinks to threat bilaterally. Fundi were not visualized. Vision acuity seems adequate. Face is symmetric without weakness. Tongue is midline. Motor system exam reveals no upper or lower extremity drift. No focal weakness. Deep tendon pulses are 1+ symmetric. Plantars are downgoing. Sensation is intact. Coordination is accurate. Gait was not tested. ASSESSMENT/PLAN :  [name] is a 73 y.o. female with hx of previous stroke presenting with expressive aphasia. She did not receive IV t-PA due to delay in arrival. Imaging confirms a left insular cortical and subcoritcal infarct.etiology likely embolic Stroke work up underway.  Stroke:  left insular cortical and subcoritcal infarct embolic secondary to unknown etiology     aspirin 325 mg orally every day prior to admission, now on aspirin 325 mg  orally every day  MRI left insular cortical and subcoritcal infarct  MRA Stenosis of left MCA branch vessel   2D Echo  No source of embolus  Carotid no stenosis TEE no source of embolus As TEE negative, a [facility] electrophysiologist will consult and consider placement of an implantable loop recorder to evaluate for atrial fibrillation as etiology of stroke. This has been explained to patient/family by Leonie Man and they are agreeable.   HgbA1c 5.8  SCDs for VTE prophylaxis  Cardiac thin liquids.   OOB with  assistance  Resultant mild expressive aphasia  Therapy needs:  none  Disposition:  Home  Ok for discharge once loop placed from neuro perspective. Follow up Dr. Leonie Man in 2 months, stroke clinic. Consider stroke ESUS at follow up.  Hyperlipidemia  LDL 57   Patient on lipitor 20 mg and omega 3 daily at home, resumed in hospital  At LDL goal < 100   Other Stroke Risk Factors Advanced age   Hx stroke/TIA March 2013, patchy left cerebellar infarcts felt to be due to small vessel disease given location. Placed on aspirin.    etoh use  Hospital day # 2  Burnetta Sabin, MSN, RN, ANVP-BC, ANP-BC, [facility] Pager: [phone] 11/07/2013 11:28 AM  I have personally examined this patient, reviewed notes, independently viewed imaging studies, participated in medical decision making and plan of care. I have made any additions or clarifications directly to the above note. Agree with note above.    Antony Contras, MD Medical Director [place] Pager: [phone] 11/07/2013 3:51 PM    To contact Stroke Continuity provider, please refer to [email or web address] After hours, contact General Neurology

## 2013-11-07 NOTE — CV Procedure (Signed)
     Transesophageal Echocardiogram Note  Felicia Hunter [id number] August 13, 1940  Procedure: Transesophageal Echocardiogram Indications: CVA  Procedure Details Consent: Obtained Time Out: Verified patient identification, verified procedure, site/side was marked, verified correct patient position, special equipment/implants available, Radiology Safety Procedures followed,  medications/allergies/relevent history reviewed, required imaging and test results available.  Performed  Medications: Fentanyl: 50 mcg Versed: 4 mg  Left ventricle: The cavity size was normal. Systolic function was normal. The estimated ejection fraction was in the range of 55% to 60%. Wall motion was normal; there were no regional wall motion abnormalities. - Left atrium: The atrium was mildly dilated. - Atrial septum: Not well visualized no obvious ASA/PFO. There was increased thickness of the septum, consistent with lipomatous hypertrophy.  Left Ventrical:  Normal size, LVEF 55-60%  Mitral Valve: Mild MR  Aortic Valve: Normal, trileaflet, no AI  Tricuspid Valve: Normal, trace TR  Pulmonic Valve: Normal, no PR  Left Atrium/ Left atrial appendage: Normal size and emptying velocities, no thrombus  Atrial septum: Lipomatous hypertrophy of the interatrial septum, No PFO or ASD by color Doppler and agitated saline study  Aorta: Mild plaque with severe non-mobile plaque in the aortic arch  Complications: No apparent complications Patient did tolerate procedure well.  Dorothy Spark, MD, [facility] 11/07/2013, 8:59 AM

## 2013-11-07 NOTE — Progress Notes (Signed)
Pt transported off to ENDO for TEE; pt consent signed and taken by night shift nurse. P.[employer] RN

## 2013-11-07 NOTE — H&P (View-Only) (Signed)
ELECTROPHYSIOLOGY CONSULT NOTE  Patient ID: [name] MRN: [id number], DOB/AGE: September 13, 1940   Admit date: 11/05/2013 Date of Consult: 11/06/2013  Primary Physician: Horatio Pel, MD Primary Cardiologist: new to [facility] Reason for Consultation: Cryptogenic stroke; recommendations regarding Implantable Loop Recorder  History of Present Illness Felicia Hunter was admitted on 11/05/2013 with expressive aphasia.  Imaging demonstrated left insuluar cortical infarct.  she has undergone workup for stroke including echocardiogram and carotid dopplers.  The patient has been monitored on telemetry which has demonstrated sinus rhythm with no arrhythmias.  Inpatient stroke work-up is to be completed with a TEE.   Echocardiogram this admission demonstrated EF 55-60%, no RWMA, lipomatous hypertrophy of the septum, LA 40.  Lab work is reviewed  Prior to admission, the patient denies chest pain, shortness of breath, dizziness, palpitations, or syncope.    EP has been asked to evaluate for placement of an implantable loop recorder to monitor for atrial fibrillation.  ROS is negative except as outlined above.    Past Medical History  Diagnosis Date  . GERD (gastroesophageal reflux disease)   . Arthritis   . Cough     PERSISTANT COUGH X 20 YRS  . Tubulovillous adenoma of colon 08/2003  . Diverticulosis   . Hyperlipidemia   . Osteopenia   . Alopecia   . MVP (mitral valve prolapse)   . Clavicle fracture   . Disorders of bilirubin excretion   . Stroke 06/07/11    NO RESIDUAL PROBLEMS  . Difficulty sleeping     takes Ambien  . Breast cancer      Surgical History:  Past Surgical History  Procedure Laterality Date  . Collarbone  september 2011    pinned and plated, right  . Abdominal hysterectomy  1993  . Vein surgery  1974    left leg  . Nasal septum surgery    . Tonsillectomy    . Joint replacement  july 9th, 2012    left knee replacement   . Knee reconstruction   7/12    revision lt total knee  . Breast enhancement surgery      x3-then removed 2011 for bilat mastectomies  . Chest wall tumor excision  5/12    noduals removed-neg  . Breast capsulotomy      after bilat mastectomies-implants removed  . Breast surgery  june 2011    double mastectomy nodes from rt  . Mass excision Left 06/19/2012    Procedure: EXCISION OF LARGE MASS LEFT NECK/SHOULDER WITH PLASTIC CLOSURE;  Surgeon: Cristine Polio, MD;  Location: [place];  Service: Plastics;  Laterality: Left;  . Total hip arthroplasty Right 04/10/2013    Procedure: RIGHT TOTAL HIP ARTHROPLASTY ANTERIOR APPROACH;  Surgeon: [name], MD;  Location: [facility];  Service: Orthopedics;  Laterality: Right;     Prescriptions prior to admission  Medication Sig Dispense Refill  . aspirin 325 MG tablet Take 325 mg by mouth daily.      Marland Kitchen atorvastatin (LIPITOR) 20 MG tablet Take 20 mg by mouth every evening.       Marland Kitchen BIOTIN PO Take 1 tablet by mouth daily.      . calcium carbonate (CALCIUM 600) 600 MG TABS Take 600 mg by mouth 2 (two) times daily with a meal.       . cholecalciferol (VITAMIN D) 1000 UNITS tablet Take 1,000 Units by mouth daily.      Marland Kitchen glucosamine-chondroitin 500-400 MG tablet Take 1 tablet by  mouth 2 (two) times daily.      . Multiple Vitamins-Minerals (MULTIVITAMIN & MINERAL PO) Take by mouth every morning.      Marland Kitchen omega-3 acid ethyl esters (LOVAZA) 1 G capsule Take 1 g by mouth 2 (two) times daily.       Marland Kitchen zolpidem (AMBIEN) 10 MG tablet Take 10 mg by mouth at bedtime as needed. sleep        Inpatient Medications:  . aspirin  300 mg Rectal Daily   Or  . aspirin  325 mg Oral Daily  . atorvastatin  20 mg Oral QPM  . cholecalciferol  1,000 Units Oral Daily  . omega-3 acid ethyl esters  1 g Oral BID    Allergies:  Allergies  Allergen Reactions  . Sulfonamide Derivatives Rash    History   Social History  . Marital Status: Widowed    Spouse Name: N/A    Number of  Children: 2  . Years of Education: N/A   Occupational History  . retired    Social History Main Topics  . Smoking status: Never Smoker   . Smokeless tobacco: Never Used  . Alcohol Use: 0.6 oz/week    1 Glasses of wine per week     Comment: 1 glass of wine per night  . Drug Use: No  . Sexual Activity: Not on file   Other Topics Concern  . Not on file   Social History Narrative  . No narrative on file     Family History  Problem Relation Age of Onset  . Aneurysm Father     BP 136/78  Pulse 68  Temp(Src) 98.4 F (36.9 C) (Oral)  Resp 16  Ht 5\' 5"  (1.651 m)  Wt 135 lb 8 oz (61.462 kg)  BMI 22.55 kg/m2  SpO2 94%  Physical Exam: Well appearing 73 yo woman, NAD HEENT: Unremarkable,[place], AT Neck:  6 JVD, no thyromegally Back:  No CVA tenderness Lungs:  Clear with no wheezes, rales, or rhonchi HEART:  Regular rate rhythm, no murmurs, no rubs, no clicks Abd:  soft, positive bowel sounds, no organomegally, no rebound, no guarding Ext:  2 plus pulses, no edema, no cyanosis, no clubbing Skin:  No rashes no nodules Neuro:  CN II through XII intact, motor grossly intact   Labs:   Lab Results  Component Value Date   WBC 5.7 11/05/2013   HGB 14.4 11/05/2013   HCT 43.7 11/05/2013   MCV 95.0 11/05/2013   PLT 234 11/05/2013    Recent Labs Lab 11/05/13 1115  NA 139  K 4.1  CL 103  CO2 22  BUN 12  CREATININE 0.73  CALCIUM 10.2  PROT 7.1  BILITOT 1.0  ALKPHOS 70  ALT 19  AST 22  GLUCOSE 114*     Radiology/Studies: Dg Chest 2 View 11/05/2013   CLINICAL DATA:  Stroke.  History of breast cancer.  EXAM: CHEST  2 VIEW  COMPARISON:  Chest radiograph 12/26/2009  FINDINGS: Cardiac leads project over the chest. Heart size within normal limits. The heart can't mediastinal contours are stable. Trachea is midline. The lungs are clear. Negative for pleural effusion. Negative for pneumothorax. Postoperative changes of the distal right clavicle. There are degenerative changes of the  right shoulder. Mild S-shaped thoracolumbar scoliosis. Suspect bilateral mastectomies.  IMPRESSION: No active cardiopulmonary disease.  Degenerative changes of the right shoulder and postoperative changes of the distal right clavicle.   Electronically Signed   By: Audelia Acton.D.  On: 11/05/2013 21:46   Ct Head (brain) Wo Contrast 11/05/2013   CLINICAL DATA:  A aphasia and speech difficulty.  EXAM: CT HEAD WITHOUT CONTRAST  TECHNIQUE: Contiguous axial images were obtained from the base of the skull through the vertex without intravenous contrast.  COMPARISON:  Noncontrast CT scan of the brain of June 07, 2011  FINDINGS: The ventricles are normal in size and position. There are areas of hypodensity in the mid parietal cortex on the right which is likely chronic. On the left there is subtle hypodensity worrisome for acute ischemia which crosses the gray and white matter. There is no acute intracranial hemorrhage. There are old lacunar infarctions in the left cerebellar hemisphere. The observed paranasal sinuses and mastoid air cells are clear. There is no skull fracture.  IMPRESSION: 1. Acute ischemic change in the left parietal lobe is present consistent with an evolving ischemic infarction. Encephalomalacia in the right parietal lobe likely reflects previous ischemic insult. 2. There are old infarcts in the left cerebellar hemisphere. 3. Next item there is no acute intracranial hemorrhage.   Electronically Signed   By: David  Martinique   On: 11/05/2013 11:53   Mr Jodene Nam Head Wo Contrast 11/05/2013   CLINICAL DATA:  Aphasia  EXAM: MRI HEAD WITHOUT CONTRAST  MRA HEAD WITHOUT CONTRAST  TECHNIQUE: Multiplanar, multiecho pulse sequences of the brain and surrounding structures were obtained without intravenous contrast. Angiographic images of the head were obtained using MRA technique without contrast.  COMPARISON:  Head CT same day.  MRI 06/08/2011.  FINDINGS: MRI HEAD FINDINGS  Diffusion imaging shows a 4 cm region of  acute infarction affecting the left insula and frontoparietal cortical and subcortical brain. This consistent with left MCA branch vessel infarction. There is mild swelling but no hemorrhage or mass effect. No other area of acute infarction.  The brainstem is normal. There are old cerebellar infarctions, most notable inferiorly on the left. The cerebral hemispheres otherwise show an old cortical and subcortical infarction in the right parietal region and mild chronic small-vessel change of the deep white matter. No mass lesion, hemorrhage, hydrocephalus or extra-axial collection. No pituitary mass. No inflammatory sinus disease. No skull or skullbase lesion.  MRA HEAD FINDINGS  Both internal carotid arteries are widely patent into the brain. The anterior and middle cerebral vessels are patent without proximal stenosis, aneurysm or vascular malformation. There is narrowing of a left MCA branch probably serving the region of infarction, presumably due to embolic disease.  Both vertebral arteries are widely patent to the basilar. No basilar stenosis. Posterior circulation branch vessels are patent.  IMPRESSION: Acute infarction affecting the left insula and frontoparietal cortical and subcortical brain consistent with left MCA branch vessel territory infarction. Mild swelling but no hemorrhage or mass effect.  Old ischemic changes elsewhere throughout the brain as outlined above.  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   Electronically Signed   By: Nelson Chimes M.D.   On: 11/05/2013 14:12   12-lead ECG sinus rhythm, rate 82, normal intervals; all previous EKG's available reviewed - no atrial fibrillation  Telemetry - nsr   Assessment and Plan 1.Cryptogenic stroke (multiple) 2. No cardiac source of thrombus by TEE Rec: the indications for ILR insertion have been discussed with the patient and she wishes to proceed.  Mikle Bosworth.D.

## 2013-11-07 NOTE — H&P (View-Only) (Signed)
Patient ID: RUTHANN ANGULO, female   DOB: 03-25-1940, 73 y.o.   MRN: [id number] [facility] PROGRESS NOTE  REEM FLEURY XIP:[id number] DOB: [birth year] DOA: 11/05/2013 PCP: Horatio Pel, MD  Brief narrative: 73 y.o. female with previous CVA in 2013 and no residual symptoms who presented to [facility] ED 11/05/2013 with expressive aphasia.  Imaging confirmed a left insular cortical and subcoritcal infarct.etiology likely embolic  Pt was not a candidate for t-PA due to delay in arrival. Other imaging studies included CXR which did not show acute cardiopulmonary disease.  Assessment/Plan:    Principal Problem:   CVA in left insular cortical and subcortical area  Stroke felt to be embolic secondary to unknown etiology  Per neurology, we will proceed with TEE to look for embolic source. Plan for TEE in am.  Continue aspirin daily.  2 D ECHO showed EF 55%.  Carotid doppler pending, PT/OT evaluation - pending.   A1c is 5.8 on this admission.  Neurology is following. Active Problems: Hyperlipidemia   LDL 57. LDL goal < 100   Patient on lipitor 20 mg and omega 3 daily at home, resumed in hospital  DVT Prophylaxis   SCD's bilaterally    Code Status: Full.  Family Communication:  Family not at the bedside this am Disposition Plan: remains inpatient    IV Access:   Peripheral IV Procedures and diagnostic studies:    Dg Chest 2 View 11/05/2013   No active cardiopulmonary disease.  Degenerative changes of the right shoulder and postoperative changes of the distal right clavicle.     Ct Head (brain) Wo Contrast 11/05/2013  1. Acute ischemic change in the left parietal lobe is present consistent with an evolving ischemic infarction. Encephalomalacia in the right parietal lobe likely reflects previous ischemic insult. 2. There are old infarcts in the left cerebellar hemisphere. 3. Next item there is no acute intracranial hemorrhage.    Mr Virgel Paling Wo Contrast 11/05/2013   Acute  infarction affecting the left insula and frontoparietal cortical and subcortical brain consistent with left MCA branch vessel territory infarction. Mild swelling but no hemorrhage or mass effect.  Old ischemic changes elsewhere throughout the brain as outlined above.  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     Mr Brain Wo Contrast  11/05/2013   Acute infarction affecting the left insula and frontoparietal cortical and subcortical brain consistent with left MCA branch vessel territory infarction. Mild swelling but no hemorrhage or mass effect.  Old ischemic changes elsewhere throughout the brain as outlined above.  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     Medical Consultants:   Neurology Cardiology for TEE Other Consultants:   PT/OT Anti-Infectives:   None    Leisa Lenz, MD  [facility] Pager [phone]  If 7PM-7AM, please contact night-coverage [email or web address] Password [email or web address] 11/06/2013, 8:04 PM   LOS: 1 day    HPI/Subjective: No acute overnight events.  Objective: Filed Vitals:   11/06/13 0600 11/06/13 0943 11/06/13 1442 11/06/13 1835  BP: 132/61 118/66 136/78 129/67  Pulse: 58 74 68 63  Temp:  98.4 F (36.9 C) 98.4 F (36.9 C) 98.2 F (36.8 C)  TempSrc:  Oral Oral Oral  Resp: 16 16 16 16   Height:      Weight:      SpO2: 94% 95% 94% 96%   No intake or output data in the 24 hours  ending 11/06/13 2004  Exam:   General:  Pt is alert, follows commands appropriately, not in acute distress  Cardiovascular: Regular rate and rhythm, S1/S2, no murmurs  Respiratory: Clear to auscultation bilaterally, no wheezing, no crackles, no rhonchi  Abdomen: Soft, non tender, non distended, bowel sounds present  Extremities: No edema, pulses DP and PT palpable bilaterally  Neuro: mild word finding difficulty otherwise unremarkable.  Data  Reviewed: Basic Metabolic Panel:  Recent Labs Lab 11/05/13 1115  NA 139  K 4.1  CL 103  CO2 22  GLUCOSE 114*  BUN 12  CREATININE 0.73  CALCIUM 10.2   Liver Function Tests:  Recent Labs Lab 11/05/13 1115  AST 22  ALT 19  ALKPHOS 70  BILITOT 1.0  PROT 7.1  ALBUMIN 4.0   No results found for this basename: LIPASE, AMYLASE,  in the last 168 hours No results found for this basename: AMMONIA,  in the last 168 hours CBC:  Recent Labs Lab 11/05/13 1115  WBC 5.7  NEUTROABS 4.2  HGB 14.4  HCT 43.7  MCV 95.0  PLT 234   Cardiac Enzymes: No results found for this basename: CKTOTAL, CKMB, CKMBINDEX, TROPONINI,  in the last 168 hours BNP: No components found with this basename: POCBNP,  CBG:  Recent Labs Lab 11/05/13 1049 11/05/13 1842 11/05/13 2231  GLUCAP 133* 165* 95    MRSA PCR SCREENING     Status: None   Collection Time    11/05/13  5:20 PM      Result Value Ref Range Status   MRSA by PCR NEGATIVE  NEGATIVE Final     Scheduled Meds: . aspirin  300 mg Rectal Daily   Or  . aspirin  325 mg Oral Daily  . atorvastatin  20 mg Oral QPM  . cholecalciferol  1,000 Units Oral Daily  . omega-3 acid ethyl esters  1 g Oral BID

## 2013-11-07 NOTE — Progress Notes (Signed)
Pt arrives from cath lab after loop recorder implant; pt A&O x4; denies any pain, incision dsg dry and intact with scant stain; pt placed back on telemetry; per report received pt can be discharge home after 1730 if stays ok; will continue to monitor pt till discharge. Pt in bed comfortably with call light within reach. Francis Gaines Marcy Bogosian RN.

## 2013-11-07 NOTE — Interval H&P Note (Signed)
History and Physical Interval Note:  11/07/2013 3:10 PM  Felicia Hunter  has presented today for surgery, with the diagnosis of bradicardia  The various methods of treatment have been discussed with the patient and family. After consideration of risks, benefits and other options for treatment, the patient has consented to  Procedure(s): LOOP RECORDER IMPLANT (N/A) as a surgical intervention .  The patient's history has been reviewed, patient examined, no change in status, stable for surgery.  I have reviewed the patient's chart and labs.  Questions were answered to the patient's satisfaction.     Mikle Bosworth.D.

## 2013-11-07 NOTE — Progress Notes (Signed)
OT Cancellation Note  Patient Details Name: Felicia Hunter MRN: [id number] DOB: 01/25/1941   Cancelled Treatment:    Reason Eval/Treat Not Completed: Patient at procedure or test/ unavailable ( TEE)  Peri Maris 11/07/2013, 8:52 AM Pager: [phone]

## 2013-11-07 NOTE — Discharge Summary (Signed)
Physician Discharge Summary  Felicia Hunter VOH:[id number] DOB: Sep 30, 1940 DOA: 11/05/2013  PCP: Horatio Pel, MD  Admit date: 11/05/2013 Discharge date: 11/07/2013  Time spent: 65 minutes  Recommendations for Outpatient Follow-up:  1. Followup with Dr. Leonie Man of neurology in 2 months. 2. Followup with Horatio Pel, MD a one-week period.  Discharge Diagnoses:  Principal Problem:   CVA (cerebral infarction)-mid/inf cerebellar infarct 3/25 Active Problems:   ASTHMA   Other and unspecified hyperlipidemia   Expressive aphasia   Discharge Condition: Stable and improved  Diet recommendation: Heart healthy  Filed Weights   11/05/13 1650  Weight: 61.462 kg (135 lb 8 oz)    History of present illness:  Felicia Hunter is a 73 y.o. female with history of previous CVA in 2013 without residual deficits, on aspirin 325 mg daily, hyperlipidemia, mitral valve prolapse, extensive surgical history, presented to the ED with complaints of speech difficulty. On 11/04/13, patient was at church when she first noticed "I was just not feeling right" at approximately 4 PM. She denied any specific complaints such as slurred speech, facial asymmetry, asymmetric limb weakness, numbness or tingling. She's had chronic intermittent headache for the last month. She was by herself during this time. She drove herself home and noticed difficulty turning off her car. When she reached home, she did notice some slurred speech and speech abnormality. She called her PCPs office and was advised to come to the ED for evaluation. In the ED, MRI brain confirms acute left MCA territory infarct likely embolic in origin with mild swelling but no hemorrhage or mass effect along with stenosis of left MCA branch vessel probably due to embolic disease. Hospitalist admission requested. Patient stated that her symptoms had improved, on admission.   Hospital Course:  #1 acute CVA/left insular cortical and subcortical  infarct embolic secondary to unknown etiology Patient was admitted with an expressive aphasia did not receive IV TPA secondary to delay in presentation. Patient was admitted to the telemetry floor her stroke workup done. Neurology was consulted and patient was followed by the stroke team throughout the hospitalization. Patient was placed on aspirin 325 mg daily for secondary stroke prevention. MRI MRA of the head which was done was consistent with a left insular cortical and subcortical infarct with stenosis of the left MCA branch vessel. 2-D echo which was done had no source of emboli. Carotid Dopplers which were done had no significant ICA stenosis. A transesophageal echocardiogram was done which showed no source of emboli. Hemoglobin A1c which was obtained was 5.8. Cardiology was consulted for placement of loop recorder. Patient improved clinically was discharged in stable and improved condition and is to followup with neurology as outpatient.  #2 hyperlipidemia On admission patient was on Lipitor prior to admission as well as omega-3 daily. Fasting lipid panel which was done and LDL of 57. Patient was maintained on a home regimen.  The rest of patient's chronic medical issues remained stable throughout the hospitalization patient was discharged in stable and improved condition.  Procedures:  CT head without contrast 11/05/2013  Chest x-ray 11/05/2013  MRI MRA head 11/05/2013  2-D echo 11/06/2013  TEE 11/07/2013  Carotid Dopplers 11/06/2013  Insertion of implantable loop recorder 11/07/2013 Dr. Crissie Sickles  Consultations:  Neurology: Dr Nicole Kindred 11/05/13  Cardiology : Dr Lovena Le 11/06/13  Discharge Exam: Filed Vitals:   11/07/13 1540  BP: 139/48  Pulse: 62  Temp: 98 F (36.7 C)  Resp: 18    General: NAD Cardiovascular: RRR Respiratory: CTAB  Discharge Instructions You were cared for by a hospitalist during your hospital stay. If you have any questions about your discharge  medications or the care you received while you were in the hospital after you are discharged, you can call the unit and asked to speak with the hospitalist on call if the hospitalist that took care of you is not available. Once you are discharged, your primary care physician will handle any further medical issues. Please note that NO REFILLS for any discharge medications will be authorized once you are discharged, as it is imperative that you return to your primary care physician (or establish a relationship with a primary care physician if you do not have one) for your aftercare needs so that they can reassess your need for medications and monitor your lab values.  Discharge Instructions   Diet - low sodium heart healthy    Complete by:  As directed      Discharge instructions    Complete by:  As directed   Follow up with Horatio Pel, MD in 1 week. Follow up with Dr Leonie Man in 2 months     Increase activity slowly    Complete by:  As directed             Medication List         aspirin 325 MG tablet  Take 325 mg by mouth daily.     atorvastatin 20 MG tablet  Commonly known as:  LIPITOR  Take 20 mg by mouth every evening.     BIOTIN PO  Take 1 tablet by mouth daily.     CALCIUM 600 600 MG Tabs tablet  Generic drug:  calcium carbonate  Take 600 mg by mouth 2 (two) times daily with a meal.     cholecalciferol 1000 UNITS tablet  Commonly known as:  VITAMIN D  Take 1,000 Units by mouth daily.     glucosamine-chondroitin 500-400 MG tablet  Take 1 tablet by mouth 2 (two) times daily.     MULTIVITAMIN & MINERAL PO  Take by mouth every morning.     omega-3 acid ethyl esters 1 G capsule  Commonly known as:  LOVAZA  Take 1 g by mouth 2 (two) times daily.     zolpidem 10 MG tablet  Commonly known as:  AMBIEN  Take 10 mg by mouth at bedtime as needed. sleep       Allergies  Allergen Reactions  . Sulfonamide Derivatives Rash       Follow-up Information   Follow  up with SETHI,PRAMOD, MD. Schedule an appointment as soon as possible for a visit in 2 months. (Stroke Clinic)    Specialties:  Neurology, Radiology   Contact information:   [address] [place] [address] [phone]       Follow up with Horatio Pel, MD. Schedule an appointment as soon as possible for a visit in 1 week.   Specialty:  Internal Medicine   Contact information:   [address] [place] [address] [phone]        The results of significant diagnostics from this hospitalization (including imaging, microbiology, ancillary and laboratory) are listed below for reference.    Significant Diagnostic Studies: Dg Chest 2 View  11/05/2013   CLINICAL DATA:  Stroke.  History of breast cancer.  EXAM: CHEST  2 VIEW  COMPARISON:  Chest radiograph 12/26/2009  FINDINGS: Cardiac leads project over the chest. Heart size within normal limits. The heart can't  mediastinal contours are stable. Trachea is midline. The lungs are clear. Negative for pleural effusion. Negative for pneumothorax. Postoperative changes of the distal right clavicle. There are degenerative changes of the right shoulder. Mild S-shaped thoracolumbar scoliosis. Suspect bilateral mastectomies.  IMPRESSION: No active cardiopulmonary disease.  Degenerative changes of the right shoulder and postoperative changes of the distal right clavicle.   Electronically Signed   By: Curlene Dolphin M.D.   On: 11/05/2013 21:46   Ct Head (brain) Wo Contrast  11/05/2013   CLINICAL DATA:  A aphasia and speech difficulty.  EXAM: CT HEAD WITHOUT CONTRAST  TECHNIQUE: Contiguous axial images were obtained from the base of the skull through the vertex without intravenous contrast.  COMPARISON:  Noncontrast CT scan of the brain of June 07, 2011  FINDINGS: The ventricles are normal in size and position. There are areas of hypodensity in the mid parietal cortex on the right which is likely chronic. On the  left there is subtle hypodensity worrisome for acute ischemia which crosses the gray and white matter. There is no acute intracranial hemorrhage. There are old lacunar infarctions in the left cerebellar hemisphere. The observed paranasal sinuses and mastoid air cells are clear. There is no skull fracture.  IMPRESSION: 1. Acute ischemic change in the left parietal lobe is present consistent with an evolving ischemic infarction. Encephalomalacia in the right parietal lobe likely reflects previous ischemic insult. 2. There are old infarcts in the left cerebellar hemisphere. 3. Next item there is no acute intracranial hemorrhage.   Electronically Signed   By: David  Martinique   On: 11/05/2013 11:53   Mr Jodene Nam Head Wo Contrast  11/05/2013   CLINICAL DATA:  Aphasia  EXAM: MRI HEAD WITHOUT CONTRAST  MRA HEAD WITHOUT CONTRAST  TECHNIQUE: Multiplanar, multiecho pulse sequences of the brain and surrounding structures were obtained without intravenous contrast. Angiographic images of the head were obtained using MRA technique without contrast.  COMPARISON:  Head CT same day.  MRI 06/08/2011.  FINDINGS: MRI HEAD FINDINGS  Diffusion imaging shows a 4 cm region of acute infarction affecting the left insula and frontoparietal cortical and subcortical brain. This consistent with left MCA branch vessel infarction. There is mild swelling but no hemorrhage or mass effect. No other area of acute infarction.  The brainstem is normal. There are old cerebellar infarctions, most notable inferiorly on the left. The cerebral hemispheres otherwise show an old cortical and subcortical infarction in the right parietal region and mild chronic small-vessel change of the deep white matter. No mass lesion, hemorrhage, hydrocephalus or extra-axial collection. No pituitary mass. No inflammatory sinus disease. No skull or skullbase lesion.  MRA HEAD FINDINGS  Both internal carotid arteries are widely patent into the brain. The anterior and middle  cerebral vessels are patent without proximal stenosis, aneurysm or vascular malformation. There is narrowing of a left MCA branch probably serving the region of infarction, presumably due to embolic disease.  Both vertebral arteries are widely patent to the basilar. No basilar stenosis. Posterior circulation branch vessels are patent.  IMPRESSION: Acute infarction affecting the left insula and frontoparietal cortical and subcortical brain consistent with left MCA branch vessel territory infarction. Mild swelling but no hemorrhage or mass effect.  Old ischemic changes elsewhere throughout the brain as outlined above.  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   Electronically Signed   By: Nelson Chimes M.D.   On: 11/05/2013 14:12   Mr Brain Lottie Dawson  Contrast  11/05/2013   CLINICAL DATA:  Aphasia  EXAM: MRI HEAD WITHOUT CONTRAST  MRA HEAD WITHOUT CONTRAST  TECHNIQUE: Multiplanar, multiecho pulse sequences of the brain and surrounding structures were obtained without intravenous contrast. Angiographic images of the head were obtained using MRA technique without contrast.  COMPARISON:  Head CT same day.  MRI 06/08/2011.  FINDINGS: MRI HEAD FINDINGS  Diffusion imaging shows a 4 cm region of acute infarction affecting the left insula and frontoparietal cortical and subcortical brain. This consistent with left MCA branch vessel infarction. There is mild swelling but no hemorrhage or mass effect. No other area of acute infarction.  The brainstem is normal. There are old cerebellar infarctions, most notable inferiorly on the left. The cerebral hemispheres otherwise show an old cortical and subcortical infarction in the right parietal region and mild chronic small-vessel change of the deep white matter. No mass lesion, hemorrhage, hydrocephalus or extra-axial collection. No pituitary mass. No inflammatory sinus disease. No skull or skullbase lesion.  MRA HEAD FINDINGS   Both internal carotid arteries are widely patent into the brain. The anterior and middle cerebral vessels are patent without proximal stenosis, aneurysm or vascular malformation. There is narrowing of a left MCA branch probably serving the region of infarction, presumably due to embolic disease.  Both vertebral arteries are widely patent to the basilar. No basilar stenosis. Posterior circulation branch vessels are patent.  IMPRESSION: Acute infarction affecting the left insula and frontoparietal cortical and subcortical brain consistent with left MCA branch vessel territory infarction. Mild swelling but no hemorrhage or mass effect.  Old ischemic changes elsewhere throughout the brain as outlined above.  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   Electronically Signed   By: Nelson Chimes M.D.   On: 11/05/2013 14:12    Microbiology: Recent Results (from the past 240 hour(s))  MRSA PCR SCREENING     Status: None   Collection Time    11/05/13  5:20 PM      Result Value Ref Range Status   MRSA by PCR NEGATIVE  NEGATIVE Final   Comment:            The GeneXpert MRSA Assay (FDA     approved for NASAL specimens     only), is one component of a     comprehensive MRSA colonization     surveillance program. It is not     intended to diagnose MRSA     infection nor to guide or     monitor treatment for     MRSA infections.     Labs: Basic Metabolic Panel:  Recent Labs Lab 11/05/13 1115  NA 139  K 4.1  CL 103  CO2 22  GLUCOSE 114*  BUN 12  CREATININE 0.73  CALCIUM 10.2   Liver Function Tests:  Recent Labs Lab 11/05/13 1115  AST 22  ALT 19  ALKPHOS 70  BILITOT 1.0  PROT 7.1  ALBUMIN 4.0   No results found for this basename: LIPASE, AMYLASE,  in the last 168 hours No results found for this basename: AMMONIA,  in the last 168 hours CBC:  Recent Labs Lab 11/05/13 1115  WBC 5.7  NEUTROABS 4.2  HGB 14.4  HCT 43.7   MCV 95.0  PLT 234   Cardiac Enzymes: No results found for this basename: CKTOTAL, CKMB, CKMBINDEX, TROPONINI,  in the last 168 hours BNP: BNP (last 3 results) No results found for this  basename: PROBNP,  in the last 8760 hours CBG:  Recent Labs Lab 11/05/13 2231 11/06/13 2240 11/07/13 0657 11/07/13 1137 11/07/13 1721  GLUCAP 95 106* 99 145* 109*       Signed:  Yamna Mackel MD [facility] 11/07/2013, 5:42 PM

## 2013-11-07 NOTE — Progress Notes (Addendum)
11/06/13 1400  SLP Visit Information  SLP Received On 11/06/13  General Information  HPI 73 y.o. female admitted to [facility] on 11/05/13 with slurred speech, and "just not feeling right". Pt admitted for stroke workup and found by MRI to have a L MCA branch infarct. Pt with significant PMhx of MVP, osteopenia, stroke, breast CA (s/p double mastectomy), R hip replacement, and L knee replacement.   Prior Functional Status  Cognitive/Linguistic Baseline WFL  Type of Home House  Available Help at Discharge Family;Available PRN/intermittently  Pain Assessment  Pain Assessment No/denies pain  Cognition  Overall Cognitive Status Within Functional Limits for tasks assessed  Auditory Comprehension  Overall Auditory Comprehension Appears within functional limits for tasks assessed  Verbal Expression  Overall Verbal Expression Appears within functional limits for tasks assessed  Oral Motor/Sensory Function  Overall Oral Motor/Sensory Function Appears within functional limits for tasks assessed  Motor Speech  Overall Motor Speech Appears within functional limits for tasks assessed  Assessment  Clinical Impression Statement Pt demonstrates adequate cogntive linguistic function. No anomia or paraphasia noted over 15 minutes of conversation and naming tasks. Pt communicating functionally. Offered information regarding f/u if pt notices that deficits persist. No acute SLP f/u needed, will sign off.   Individuals Consulted  Consulted and Agree with Results and Recommendations Patient  SLP Evaluations  $ SLP Speech Visit 1 Procedure  SLP Evaluations  $ SLP EVAL LANGUAGE/SOUND PRODUCTION 1 Procedure

## 2013-11-07 NOTE — Progress Notes (Signed)
  Echocardiogram Echocardiogram Transesophageal has been performed.  Felicia Hunter 11/07/2013, 10:21 AM

## 2013-11-08 ENCOUNTER — Encounter (HOSPITAL_COMMUNITY): Payer: Self-pay | Admitting: Cardiology

## 2013-11-14 DIAGNOSIS — Z7982 Long term (current) use of aspirin: Secondary | ICD-10-CM | POA: Diagnosis not present

## 2013-11-14 DIAGNOSIS — I6350 Cerebral infarction due to unspecified occlusion or stenosis of unspecified cerebral artery: Secondary | ICD-10-CM | POA: Diagnosis not present

## 2013-11-14 DIAGNOSIS — E7800 Pure hypercholesterolemia, unspecified: Secondary | ICD-10-CM | POA: Diagnosis not present

## 2013-11-15 ENCOUNTER — Ambulatory Visit (INDEPENDENT_AMBULATORY_CARE_PROVIDER_SITE_OTHER): Payer: Medicare Other | Admitting: *Deleted

## 2013-11-15 DIAGNOSIS — I6350 Cerebral infarction due to unspecified occlusion or stenosis of unspecified cerebral artery: Secondary | ICD-10-CM

## 2013-11-15 LAB — MDC_IDC_ENUM_SESS_TYPE_INCLINIC

## 2013-11-15 NOTE — Progress Notes (Signed)
Wound check-ILR.  Steri strips removed, wound well healed.  1 symptom recorded episode noted from implant.  R-waves .75-.26mV.  Follow up via [facility]

## 2013-11-16 ENCOUNTER — Telehealth: Payer: Self-pay | Admitting: Internal Medicine

## 2013-11-16 NOTE — Telephone Encounter (Signed)
New problem   Pt want to know if medical alert system will affect her implant in her chest. Please call pt she want to know today. Please call pt.

## 2013-11-16 NOTE — Telephone Encounter (Signed)
Spoke with patient aware it will not harm the [facility] implant

## 2013-11-28 DIAGNOSIS — I619 Nontraumatic intracerebral hemorrhage, unspecified: Secondary | ICD-10-CM | POA: Diagnosis not present

## 2013-11-30 ENCOUNTER — Encounter: Payer: Self-pay | Admitting: Internal Medicine

## 2013-12-06 ENCOUNTER — Ambulatory Visit (INDEPENDENT_AMBULATORY_CARE_PROVIDER_SITE_OTHER): Payer: Medicare Other | Admitting: *Deleted

## 2013-12-06 DIAGNOSIS — I6350 Cerebral infarction due to unspecified occlusion or stenosis of unspecified cerebral artery: Secondary | ICD-10-CM | POA: Diagnosis not present

## 2013-12-06 DIAGNOSIS — I639 Cerebral infarction, unspecified: Secondary | ICD-10-CM

## 2013-12-06 DIAGNOSIS — Z23 Encounter for immunization: Secondary | ICD-10-CM | POA: Diagnosis not present

## 2013-12-18 LAB — MDC_IDC_ENUM_SESS_TYPE_REMOTE
Date Time Interrogation Session: 20150920124829
Zone Setting Detection Interval: 2000 ms
Zone Setting Detection Interval: 3000 ms
Zone Setting Detection Interval: 380 ms

## 2013-12-20 ENCOUNTER — Encounter: Payer: Self-pay | Admitting: Neurology

## 2013-12-20 ENCOUNTER — Ambulatory Visit (INDEPENDENT_AMBULATORY_CARE_PROVIDER_SITE_OTHER): Payer: Medicare Other | Admitting: Neurology

## 2013-12-20 VITALS — BP 156/87 | HR 67 | Ht 65.0 in | Wt 141.6 lb

## 2013-12-20 DIAGNOSIS — Z8673 Personal history of transient ischemic attack (TIA), and cerebral infarction without residual deficits: Secondary | ICD-10-CM | POA: Diagnosis not present

## 2013-12-20 DIAGNOSIS — I639 Cerebral infarction, unspecified: Secondary | ICD-10-CM

## 2013-12-20 DIAGNOSIS — I6310 Cerebral infarction due to embolism of unspecified precerebral artery: Secondary | ICD-10-CM | POA: Diagnosis not present

## 2013-12-20 NOTE — Patient Instructions (Signed)
-   continue ASA and lipitor for stroke prevention for now - continue to follow up with cardiology for monitoring - continue follow up with PCP this month for risk factor modification - you are a good candidate for the research study designed for you. We will give you information to read. Please consider to participate.  - check BP at home - follow up in 3 months.

## 2013-12-20 NOTE — Progress Notes (Signed)
STROKE NEUROLOGY FOLLOW UP NOTE  NAME: Felicia Hunter DOB: 09-24-1940  REASON FOR VISIT: stroke follow up HISTORY FROM: pt and chart  Today we had the pleasure of seeing Felicia Hunter in follow-up at our Neurology Clinic. Pt was accompanied by no one.   History Summary Felicia Hunter is an 73 y.o. female with previous CVA in 2013 and no residual symptoms. Patient was by herself on 11/04/2013 when around 4PM she noted she was having a hard time starting her car and misspelled words. Her daughter noted she had some word substitution and told her to called her PCP. She called her primary MD second day who stated she should be seen in ED. She came to [facility] ER and symptoms gradually getting better. MRI showed left MCA cortical stroke with old stroke at left cerebellar and right parietal region. Concerning for emboli pattern. TEE negative. Loop recorder placed before discharge. She was discharged with full dose ASA and lipitor.  Interval History During the interval time, the patient has been doing well. No recurrent symptoms and her speech is normal. Loop recorder so far no AF episodes. She is still on ASA and lipitor. We discussed REPECT-ESUS trial with her.    REVIEW OF SYSTEMS: Full 14 system review of systems performed and notable only for those listed below and in HPI above, all others are negative:  Constitutional: N/A  Cardiovascular: N/A  Ear/Nose/Throat: N/A  Skin: N/A  Eyes: N/A  Respiratory: N/A  Gastroitestinal: N/A  Genitourinary: N/A Hematology/Lymphatic: N/A  Endocrine: N/A  Musculoskeletal: joint pain, walking difficulty Allergy/Immunology: N/A  Neurological: headache  Psychiatric: N/A  The following represents the patient's updated allergies and side effects list: Allergies  Allergen Reactions  . Sulfonamide Derivatives Rash    Labs since last visit of relevance include the following: Results for orders placed in visit on 12/06/13  [id number]_SESS_[email or web address]        Result Value Ref Range   Date Time Interrogation Session [id number]     Pulse Generator Manufacturer Medtronic     Pulse Gen Model [id number] Reveal LINQ     Pulse Gen Serial Number [id number]     Zone Setting Type Category VF     Zone Setting Type Category VT     Zone Setting Type Category VENTRICULAR_TACHYCARDIA_1     Zone Detect Interval 380     Zone Setting Type Category VENTRICULAR_TACHYCARDIA_2     Zone Setting Type Category ATRIAL_FIBRILLATION     Zone Setting Type Category ATAF     Zone Setting Type Category ASYSTOLE     Zone Detect Interval 3000     Zone Setting Type Category BRADYCARDIA     Zone Detect Interval 2000     Battery Status OK     Eval Rhythm SR     Miscellaneous Comment       Value: Carelink summary report received. Battery status OK. Normal device function. 1 symptom episode--printed out. No tachy episodes, brady or pause episodes. No AF episodes. Monthly summary reports and ROV in August with GT.    The neurologically relevant items on the patient's problem list were reviewed on today's visit.  Neurologic Examination  A problem focused neurological exam (12 or more points of the single system neurologic examination, vital signs counts as 1 point, cranial nerves count for 8 points) was performed.  Blood pressure 156/87, pulse 67, height 5\' 5"  (1.651 m), weight 141 lb 9.6 oz (64.229 kg).  General - Well nourished, well developed,  in no apparent distress.  Ophthalmologic - not able to see through.  Cardiovascular - Regular rate and rhythm with no murmur.  Mental Status -  Level of arousal and orientation to time, place, and person were intact. Language including expression, naming, repetition, comprehension, reading, and writing was assessed and found intact. Fund of Knowledge was assessed and was intact.  Cranial Nerves II - XII - II - Visual field intact OU. III, IV, VI - Extraocular movements intact. V - Facial sensation intact  bilaterally. VII - Facial movement intact bilaterally. VIII - Hearing & vestibular intact bilaterally. X - Palate elevates symmetrically. XI - Chin turning & shoulder shrug intact bilaterally. XII - Tongue protrusion intact.  Motor Strength - The patient's strength was normal in all extremities and pronator drift was absent.  Bulk was normal and fasciculations were absent.   Motor Tone - Muscle tone was assessed at the neck and appendages and was normal.  Reflexes - The patient's reflexes were normal in all extremities and she had no pathological reflexes.  Sensory - Light touch, temperature/pinprick were assessed and were normal.    Coordination - The patient had normal movements in the hands and feet with no ataxia or dysmetria.  Tremor was absent.  Gait and Station - slow but steady.  Data reviewed: I personally reviewed the images and agree with the radiology interpretations.  Ct Head (brain) Wo Contrast  11/05/2013  IMPRESSION: 1. Acute ischemic change in the left parietal lobe is present consistent with an evolving ischemic infarction. Encephalomalacia in the right parietal lobe likely reflects previous ischemic insult. 2. There are old infarcts in the left cerebellar hemisphere. 3. Next item there is no acute intracranial hemorrhage.    Mri and Mra Head Wo Contrast  11/05/2013 IMPRESSION: Acute infarction affecting the left insula and frontoparietal cortical and subcortical brain consistent with left MCA branch vessel territory infarction. Mild swelling but no hemorrhage or mass effect. Old ischemic changes elsewhere throughout the brain as outlined above. 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   CUS - The vertebral arteries appear patent with antegrade flow. - Findings consistent with 1- 39 percent stenosis involving the right internal carotid artery and the left internal carotid artery.  2D echo - - Left ventricle:  The cavity size was normal. Systolic function was normal. The estimated ejection fraction was in the range of 55% to 60%. Wall motion was normal; there were no regional wall motion abnormalities. - Left atrium: The atrium was mildly dilated. - Atrial septum: Not well visualized no obvious ASA/PFO. There was increased thickness of the septum, consistent with lipomatous hypertrophy.  TEE - Left ventricle: Systolic function was normal. Wall motion was normal; there were no regional wall motion abnormalities. - Mitral valve: There was mild regurgitation. - Left atrium: No evidence of thrombus in the atrial cavity or appendage. No evidence of thrombus in the atrial cavity or appendage. - Right atrium: No evidence of thrombus in the atrial cavity or appendage. - Atrial septum: There was increased thickness of the septum, consistent with lipomatous hypertrophy. No defect or patent foramen ovale was identified. Impressions: - No cardiac source of emboli was indentified  Component     Latest Ref Rng 11/06/2013  Cholesterol     0 - 200 mg/dL 159  Triglycerides     <150 mg/dL 69  HDL     >39 mg/dL 88  Total CHOL/HDL Ratio  1.8  VLDL     0 - 40 mg/dL 14  LDL (calc)     0 - 99 mg/dL 57  Hemoglobin A1C     <5.7 % 5.8 (H)  Mean Plasma Glucose     <117 mg/dL 120 (H)    Assessment: As you may recall, she is a 73 y.o. Caucasian female with PMH of CVA in 2013 without residue and HTN was admitted to [facility] due to left MCA stroke. However, MRI also showed mulitple old stroke at left cerebellar and right parietal cortex. Pattern consistent with embolic stroke. Stroke work up showed TEE negative and she was put on loop recorder, so far sinus rhythm. She dose not have much stroke risk factors. She is a good candidate for RESPECT trial.   Plan:  - continue ASA and liptor for stroke prevention. - continue cardiac monitoring - follow up with PCP for stroke risk factor modification - give pt  information about RESPECT research trial. She is a good candidate for the study. - check BP at home - RTC in 3 months.  No orders of the defined types were placed in this encounter.    Meds ordered this encounter  Medications  . diphenhydramine-acetaminophen (TYLENOL PM) 25-500 MG TABS    Sig: Take 1 tablet by mouth at bedtime as needed.  . Naproxen Sodium (ALEVE) 220 MG CAPS    Sig: Take 1 capsule by mouth as needed.  . vitamin C (ASCORBIC ACID) 500 MG tablet    Sig: Take 500 mg by mouth as needed.    Patient Instructions  - continue ASA and lipitor for stroke prevention for now - continue to follow up with cardiology for monitoring - continue follow up with PCP this month for risk factor modification - you are a good candidate for the research study designed for you. We will give you information to read. Please consider to participate.  - check BP at home - follow up in 3 months.   Rosalin Hawking, MD PhD [facility] [address] [place], [place] [address] [phone]

## 2013-12-21 NOTE — Progress Notes (Signed)
Loop recorder 

## 2014-01-03 DIAGNOSIS — I10 Essential (primary) hypertension: Secondary | ICD-10-CM | POA: Diagnosis not present

## 2014-01-03 DIAGNOSIS — E780 Pure hypercholesterolemia: Secondary | ICD-10-CM | POA: Diagnosis not present

## 2014-01-03 DIAGNOSIS — Z0000 Encounter for general adult medical examination without abnormal findings: Secondary | ICD-10-CM | POA: Diagnosis not present

## 2014-01-07 ENCOUNTER — Ambulatory Visit (INDEPENDENT_AMBULATORY_CARE_PROVIDER_SITE_OTHER): Payer: Medicare Other | Admitting: *Deleted

## 2014-01-07 DIAGNOSIS — I639 Cerebral infarction, unspecified: Secondary | ICD-10-CM

## 2014-01-08 DIAGNOSIS — R05 Cough: Secondary | ICD-10-CM | POA: Diagnosis not present

## 2014-01-08 DIAGNOSIS — E780 Pure hypercholesterolemia: Secondary | ICD-10-CM | POA: Diagnosis not present

## 2014-01-08 DIAGNOSIS — Z1212 Encounter for screening for malignant neoplasm of rectum: Secondary | ICD-10-CM | POA: Diagnosis not present

## 2014-01-08 DIAGNOSIS — L80 Vitiligo: Secondary | ICD-10-CM | POA: Diagnosis not present

## 2014-01-08 DIAGNOSIS — Z8673 Personal history of transient ischemic attack (TIA), and cerebral infarction without residual deficits: Secondary | ICD-10-CM | POA: Diagnosis not present

## 2014-01-08 DIAGNOSIS — Z23 Encounter for immunization: Secondary | ICD-10-CM | POA: Diagnosis not present

## 2014-01-11 NOTE — Progress Notes (Signed)
Loop recorder 

## 2014-01-14 DIAGNOSIS — H5213 Myopia, bilateral: Secondary | ICD-10-CM | POA: Diagnosis not present

## 2014-01-14 DIAGNOSIS — H43813 Vitreous degeneration, bilateral: Secondary | ICD-10-CM | POA: Diagnosis not present

## 2014-01-14 DIAGNOSIS — H2513 Age-related nuclear cataract, bilateral: Secondary | ICD-10-CM | POA: Diagnosis not present

## 2014-01-14 DIAGNOSIS — H35363 Drusen (degenerative) of macula, bilateral: Secondary | ICD-10-CM | POA: Diagnosis not present

## 2014-01-17 ENCOUNTER — Telehealth: Payer: Self-pay

## 2014-01-17 ENCOUNTER — Encounter: Payer: Self-pay | Admitting: Internal Medicine

## 2014-01-17 LAB — MDC_IDC_ENUM_SESS_TYPE_REMOTE

## 2014-01-17 NOTE — Telephone Encounter (Signed)
Called patient regarding enrolling in the Respect ESUS study.  I left a message on her VM. She appears to be a good candidate but we are waiting to hear back from the sponsor as to whether she meets all I/E criteria due to her h/o cancer. I advised patient I will call her back as soon as we get a response.

## 2014-01-24 ENCOUNTER — Telehealth: Payer: Self-pay

## 2014-01-24 NOTE — Telephone Encounter (Signed)
Called patient and left a message on both her home and cell phone to see if she can come in for the Respect ESUS study. She appears to be a good candidate. There was no answer so I'm leaving a message for her to call me.

## 2014-01-28 ENCOUNTER — Encounter: Payer: Self-pay | Admitting: Neurology

## 2014-01-28 ENCOUNTER — Ambulatory Visit (INDEPENDENT_AMBULATORY_CARE_PROVIDER_SITE_OTHER): Payer: Self-pay | Admitting: Neurology

## 2014-01-28 DIAGNOSIS — I6310 Cerebral infarction due to embolism of unspecified precerebral artery: Secondary | ICD-10-CM

## 2014-01-28 NOTE — Progress Notes (Signed)
Pt came in today for screening of RESPECT-ESUS trial. She has no complains and expressed interested in the trial again.   General exam no significant clinical concerns. mRS = 1 as pt still think sometimes she has word finding difficulties but she is able to do all her activities without problem. NIHSS = 0. She is a good candidate for the trial. Will see her when she comes back in 2-3 weeks. She has appointment with me on 04/11/14.   Rosalin Hawking, MD PhD Stroke Neurology 01/28/2014 3:12 PM

## 2014-01-29 ENCOUNTER — Telehealth: Payer: Self-pay | Admitting: Neurology

## 2014-01-29 NOTE — Telephone Encounter (Signed)
I left a message for the patient to return my call.

## 2014-01-29 NOTE — Telephone Encounter (Signed)
I spoke to the patient to reschedule her Visit 2 for the Respect-Esus Trial. The appointment was initially scheduled for [id number], and then rescheduled for [id number] at 12:30h.

## 2014-01-31 ENCOUNTER — Ambulatory Visit (INDEPENDENT_AMBULATORY_CARE_PROVIDER_SITE_OTHER): Payer: Self-pay | Admitting: Neurology

## 2014-01-31 ENCOUNTER — Encounter: Payer: Self-pay | Admitting: Neurology

## 2014-01-31 DIAGNOSIS — I6310 Cerebral infarction due to embolism of unspecified precerebral artery: Secondary | ICD-10-CM

## 2014-01-31 NOTE — Progress Notes (Signed)
Patient was seen this on for initiation of investigational medications. All questions were answered. Medication compliance was emphasized and patient will be seen again in 3 months follow-up.  Rosalin Hawking, MD PhD Stroke Neurology 01/31/2014 7:20 PM

## 2014-02-01 ENCOUNTER — Encounter: Payer: Self-pay | Admitting: Neurology

## 2014-02-05 ENCOUNTER — Ambulatory Visit (INDEPENDENT_AMBULATORY_CARE_PROVIDER_SITE_OTHER): Payer: Medicare Other | Admitting: *Deleted

## 2014-02-05 DIAGNOSIS — I639 Cerebral infarction, unspecified: Secondary | ICD-10-CM

## 2014-02-13 NOTE — Progress Notes (Signed)
Loop recorder 

## 2014-02-14 DIAGNOSIS — Z853 Personal history of malignant neoplasm of breast: Secondary | ICD-10-CM | POA: Diagnosis not present

## 2014-02-21 ENCOUNTER — Encounter: Payer: Self-pay | Admitting: Internal Medicine

## 2014-02-21 ENCOUNTER — Encounter (HOSPITAL_COMMUNITY): Payer: Self-pay | Admitting: Internal Medicine

## 2014-03-06 ENCOUNTER — Ambulatory Visit (INDEPENDENT_AMBULATORY_CARE_PROVIDER_SITE_OTHER): Payer: Medicare Other | Admitting: *Deleted

## 2014-03-06 DIAGNOSIS — I639 Cerebral infarction, unspecified: Secondary | ICD-10-CM

## 2014-03-12 LAB — MDC_IDC_ENUM_SESS_TYPE_REMOTE

## 2014-03-13 NOTE — Progress Notes (Signed)
Loop recorder 

## 2014-03-26 ENCOUNTER — Encounter: Payer: Self-pay | Admitting: Internal Medicine

## 2014-03-26 LAB — MDC_IDC_ENUM_SESS_TYPE_REMOTE

## 2014-03-27 ENCOUNTER — Ambulatory Visit: Payer: Medicare Other | Admitting: Neurology

## 2014-04-01 ENCOUNTER — Telehealth: Payer: Self-pay | Admitting: Cardiology

## 2014-04-01 NOTE — Telephone Encounter (Signed)
Pt called and stated that she will be out of town for 2 weeks and will not be taking home monitor with her. Pt aware to send manual transmission to reconnect monitor when she returns home.

## 2014-04-05 ENCOUNTER — Ambulatory Visit (INDEPENDENT_AMBULATORY_CARE_PROVIDER_SITE_OTHER): Payer: Medicare Other | Admitting: *Deleted

## 2014-04-05 DIAGNOSIS — I639 Cerebral infarction, unspecified: Secondary | ICD-10-CM

## 2014-04-05 LAB — MDC_IDC_ENUM_SESS_TYPE_REMOTE

## 2014-04-11 ENCOUNTER — Encounter: Payer: Self-pay | Admitting: Neurology

## 2014-04-11 ENCOUNTER — Ambulatory Visit (INDEPENDENT_AMBULATORY_CARE_PROVIDER_SITE_OTHER): Payer: Medicare Other | Admitting: Neurology

## 2014-04-11 VITALS — BP 101/64 | HR 75 | Ht 65.0 in | Wt 140.2 lb

## 2014-04-11 DIAGNOSIS — I6310 Cerebral infarction due to embolism of unspecified precerebral artery: Secondary | ICD-10-CM | POA: Diagnosis not present

## 2014-04-11 DIAGNOSIS — Z8673 Personal history of transient ischemic attack (TIA), and cerebral infarction without residual deficits: Secondary | ICD-10-CM | POA: Diagnosis not present

## 2014-04-11 NOTE — Progress Notes (Signed)
STROKE NEUROLOGY FOLLOW UP NOTE  NAME: Felicia Hunter DOB: Oct 04, 1940  REASON FOR VISIT: stroke follow up HISTORY FROM: pt and chart  Today we had the pleasure of seeing Felicia Hunter in follow-up at our Neurology Clinic. Pt was accompanied by no one.   History Summary [name] is an 74 y.o. female with previous CVA in 2013 and no residual symptoms. Patient was by herself on 11/04/2013 when around 4PM she noted she was having a hard time starting her car and misspelled words. Her daughter noted she had some word substitution and told her to called her PCP. She called her primary MD second day who stated she should be seen in ED. She came to [facility] ER and symptoms gradually getting better. MRI showed left MCA cortical stroke with old stroke at left cerebellar and right parietal region. Concerning for emboli pattern. TEE negative. Loop recorder placed before discharge. She was discharged with full dose ASA and lipitor.  12/20/13 follow up - the patient has been doing well. No recurrent symptoms and her speech is normal. Loop recorder so far no AF episodes. She is still on ASA and lipitor. We discussed REPECT-ESUS trial with her.    Interval History During the interval time, pt has been doing well. No recurrent stroke symptoms. She was enrolled to REPECT-ESUS trial in 01/2015. Currently taking the investigational medication without side effect. She has no complaints today. Blood pressure today 101/64 in clinic. Loop recorder so far no afib episodes.  REVIEW OF SYSTEMS: Full 14 system review of systems performed and notable only for those listed below and in HPI above, all others are negative:  Constitutional: N/A  Cardiovascular: N/A  Ear/Nose/Throat: N/A  Skin: Rash  Eyes: N/A  Respiratory: N/A  Gastroitestinal: Hemorrhoids  Genitourinary: N/A Hematology/Lymphatic: N/A  Endocrine: N/A  Musculoskeletal: joint pain, neck stiffness Allergy/Immunology: N/A  Neurological: headache    Psychiatric: N/A  The following represents the patient's updated allergies and side effects list: Allergies  Allergen Reactions  . Sulfonamide Derivatives Rash    Labs since last visit of relevance include the following: Results for orders placed or performed in visit on 03/06/14  Implantable device - remote  Result Value Ref Range   Pulse Generator Manufacturer Medtronic    Pulse Gen Model [id number] Reveal LINQ    Pulse Gen Serial Number [id number]    Eval Rhythm SR    Miscellaneous Comment      Carelink summary report received. Battery status OK. Normal device function. 1 symptom episode--printed out for review. No tachy episodes, brady or pause episodes. No AF episodes. Monthly summary reports and ROV in October with GT.    The neurologically relevant items on the patient's problem list were reviewed on today's visit.  Neurologic Examination  A problem focused neurological exam (12 or more points of the single system neurologic examination, vital signs counts as 1 point, cranial nerves count for 8 points) was performed.  Blood pressure 101/64, pulse 75, height 5\' 5"  (1.651 m), weight 140 lb 3.2 oz (63.594 kg).  General - Well nourished, well developed, in no apparent distress.  Ophthalmologic - not able to see through.  Cardiovascular - Regular rate and rhythm with no murmur.  Mental Status -  Level of arousal and orientation to time, place, and person were intact. Language including expression, naming, repetition, comprehension, reading, and writing was assessed and found intact. Fund of Knowledge was assessed and was intact.  Cranial Nerves II - XII -  II - Visual field intact OU. III, IV, VI - Extraocular movements intact. V - Facial sensation intact bilaterally. VII - Facial movement intact bilaterally. VIII - Hearing & vestibular intact bilaterally. X - Palate elevates symmetrically. XI - Chin turning & shoulder shrug intact bilaterally. XII - Tongue protrusion  intact.  Motor Strength - The patient's strength was normal in all extremities and pronator drift was absent.  Bulk was normal and fasciculations were absent.   Motor Tone - Muscle tone was assessed at the neck and appendages and was normal.  Reflexes - The patient's reflexes were normal in all extremities and she had no pathological reflexes.  Sensory - Light touch, temperature/pinprick were assessed and were normal.    Coordination - The patient had normal movements in the hands and feet with no ataxia or dysmetria.  Tremor was absent.  Gait and Station - slow but steady.  Data reviewed: I personally reviewed the images and agree with the radiology interpretations.  Ct Head (brain) Wo Contrast  11/05/2013  IMPRESSION: 1. Acute ischemic change in the left parietal lobe is present consistent with an evolving ischemic infarction. Encephalomalacia in the right parietal lobe likely reflects previous ischemic insult. 2. There are old infarcts in the left cerebellar hemisphere. 3. Next item there is no acute intracranial hemorrhage.    Mri and Mra Head Wo Contrast  11/05/2013 IMPRESSION: Acute infarction affecting the left insula and frontoparietal cortical and subcortical brain consistent with left MCA branch vessel territory infarction. Mild swelling but no hemorrhage or mass effect. Old ischemic changes elsewhere throughout the brain as outlined above. 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   CUS - The vertebral arteries appear patent with antegrade flow. - Findings consistent with 1- 39 percent stenosis involving the right internal carotid artery and the left internal carotid artery.  2D echo - - Left ventricle: The cavity size was normal. Systolic function was normal. The estimated ejection fraction was in the range of 55% to 60%. Wall motion was normal; there were no regional wall motion abnormalities. - Left atrium: The  atrium was mildly dilated. - Atrial septum: Not well visualized no obvious ASA/PFO. There was increased thickness of the septum, consistent with lipomatous hypertrophy.  TEE - Left ventricle: Systolic function was normal. Wall motion was normal; there were no regional wall motion abnormalities. - Mitral valve: There was mild regurgitation. - Left atrium: No evidence of thrombus in the atrial cavity or appendage. No evidence of thrombus in the atrial cavity or appendage. - Right atrium: No evidence of thrombus in the atrial cavity or appendage. - Atrial septum: There was increased thickness of the septum, consistent with lipomatous hypertrophy. No defect or patent foramen ovale was identified. Impressions: - No cardiac source of emboli was indentified  Component     Latest Ref Rng 11/06/2013  Cholesterol     0 - 200 mg/dL 159  Triglycerides     <150 mg/dL 69  HDL     >39 mg/dL 88  Total CHOL/HDL Ratio      1.8  VLDL     0 - 40 mg/dL 14  LDL (calc)     0 - 99 mg/dL 57  Hemoglobin A1C     <5.7 % 5.8 (H)  Mean Plasma Glucose     <117 mg/dL 120 (H)    Assessment: As you may recall, she is a 74 y.o. Caucasian female with PMH of CVA in  2013 without residue and HTN was admitted to [facility] due to left MCA stroke in 10/2013. However, MRI also showed mulitple old stroke at left cerebellar and right parietal cortex. Pattern consistent with embolic stroke. Stroke work up showed TEE negative and she was put on loop recorder, so far sinus rhythm, no A. fib episodes. She dose not have much stroke risk factors. She was enrolled in RESPECT trial and tolerating with investigational drugs.   Plan:  - Continue RESPECT ESUS medications - continue liptor for stroke prevention. - Follow up with your primary care physician for stroke risk factor modification. Recommend maintain blood pressure goal <130/80, diabetes with hemoglobin A1c goal below 6.5% and lipids with LDL cholesterol goal below 70 mg/dL.    - Follow up with loop recording - RTC in 6 months.  No orders of the defined types were placed in this encounter.    Meds ordered this encounter  Medications  . diphenhydramine-acetaminophen (TYLENOL PM) 25-500 MG TABS    Sig: Take 1 tablet by mouth at bedtime as needed.    Patient Instructions  - continue RESPECT ESUS medications. Compliance with medication - continue lipitor for stroke prevention.  - Follow up with your primary care physician for stroke risk factor modification. Recommend maintain blood pressure goal <130/80, diabetes with hemoglobin A1c goal below 6.5% and lipids with LDL cholesterol goal below 70 mg/dL.  - will see you in the research appointment - follow up in 6 months    Rosalin Hawking, MD PhD [facility] [address] [place], [place] [address] [phone]

## 2014-04-11 NOTE — Progress Notes (Signed)
Loop recorder 

## 2014-04-11 NOTE — Patient Instructions (Addendum)
-   continue RESPECT ESUS medications. Compliance with medication - continue lipitor for stroke prevention.  - Follow up with your primary care physician for stroke risk factor modification. Recommend maintain blood pressure goal <130/80, diabetes with hemoglobin A1c goal below 6.5% and lipids with LDL cholesterol goal below 70 mg/dL.  - will see you in the research appointment - follow up in 6 months

## 2014-04-15 ENCOUNTER — Encounter: Payer: Self-pay | Admitting: Internal Medicine

## 2014-05-02 ENCOUNTER — Encounter (INDEPENDENT_AMBULATORY_CARE_PROVIDER_SITE_OTHER): Payer: Self-pay | Admitting: Neurology

## 2014-05-02 DIAGNOSIS — Z0289 Encounter for other administrative examinations: Secondary | ICD-10-CM

## 2014-05-06 ENCOUNTER — Ambulatory Visit (INDEPENDENT_AMBULATORY_CARE_PROVIDER_SITE_OTHER): Payer: Medicare Other | Admitting: *Deleted

## 2014-05-06 DIAGNOSIS — I639 Cerebral infarction, unspecified: Secondary | ICD-10-CM | POA: Diagnosis not present

## 2014-05-07 ENCOUNTER — Encounter: Payer: Self-pay | Admitting: Internal Medicine

## 2014-05-07 NOTE — Progress Notes (Signed)
Loop recorder 

## 2014-05-22 LAB — MDC_IDC_ENUM_SESS_TYPE_REMOTE

## 2014-06-04 ENCOUNTER — Encounter: Payer: Self-pay | Admitting: Internal Medicine

## 2014-06-05 ENCOUNTER — Ambulatory Visit: Payer: Medicare Other | Admitting: *Deleted

## 2014-06-05 ENCOUNTER — Ambulatory Visit (INDEPENDENT_AMBULATORY_CARE_PROVIDER_SITE_OTHER): Payer: Medicare Other | Admitting: *Deleted

## 2014-06-05 DIAGNOSIS — I639 Cerebral infarction, unspecified: Secondary | ICD-10-CM

## 2014-06-05 LAB — MDC_IDC_ENUM_SESS_TYPE_REMOTE

## 2014-06-11 NOTE — Progress Notes (Signed)
Loop recorder 

## 2014-06-19 ENCOUNTER — Telehealth: Payer: Self-pay

## 2014-06-19 ENCOUNTER — Ambulatory Visit (INDEPENDENT_AMBULATORY_CARE_PROVIDER_SITE_OTHER): Payer: Medicare Other | Admitting: Gastroenterology

## 2014-06-19 ENCOUNTER — Encounter: Payer: Self-pay | Admitting: Gastroenterology

## 2014-06-19 VITALS — BP 128/78 | HR 72 | Ht 65.0 in | Wt 139.6 lb

## 2014-06-19 DIAGNOSIS — Z86010 Personal history of colonic polyps: Secondary | ICD-10-CM

## 2014-06-19 DIAGNOSIS — I6310 Cerebral infarction due to embolism of unspecified precerebral artery: Secondary | ICD-10-CM

## 2014-06-19 DIAGNOSIS — Z7901 Long term (current) use of anticoagulants: Secondary | ICD-10-CM

## 2014-06-19 DIAGNOSIS — K921 Melena: Secondary | ICD-10-CM | POA: Diagnosis not present

## 2014-06-19 MED ORDER — NA SULFATE-K SULFATE-MG SULF 17.5-3.13-1.6 GM/177ML PO SOLN: 1.0000 | Freq: Once | ORAL | Status: DC

## 2014-06-19 NOTE — Telephone Encounter (Signed)
Hi, Felicia Hunter:  This patient is RESPECT ESUS patient and she is going to have colonoscopy. GI doctor has questioned regarding hold off study drugs. Is there any regulations on the protocol regarding hold off medication for procedure? Please let me know ASAP. thank you.  Felicia Hawking, MD PhD Stroke Neurology 06/19/2014 6:52 PM

## 2014-06-19 NOTE — Telephone Encounter (Signed)
  06/19/2014   RE: Felicia Hunter DOB: 02-28-1941 MRN: [id number]   Dear Dr. Erlinda Hong,    We have scheduled the above patient for an endoscopic procedure. Our records show that she is on anticoagulation therapy.   Please advise as to how long the patient may come off her therapy of your study drug which may include Pradaxa prior to the procedure, which is scheduled for 07/02/14.  Please route the your answer to Marlon Pel, [place].   Sincerely,    Marlon Pel, CMA Lucio Edward, MD

## 2014-06-19 NOTE — Patient Instructions (Signed)
You have been scheduled for a colonoscopy. Please follow written instructions given to you at your visit today.  Please pick up your prep supplies at the pharmacy within the next 1-3 days. If you use inhalers (even only as needed), please bring them with you on the day of your procedure. Your physician has requested that you go to www.startemmi.com and enter the access code given to you at your visit today. This web site gives a general overview about your procedure. However, you should still follow specific instructions given to you by our office regarding your preparation for the procedure.  Thank you for choosing me and [facility].  Pricilla Riffle. Dagoberto Ligas., MD., Marval Regal  cc: Deland Pretty, MD

## 2014-06-19 NOTE — Progress Notes (Signed)
    History of Present Illness: This is a 74 year old female who relates small amounts of bright red blood with bowel movements, on the stool and on the tissue paper for the past several months. For the past week she has not noted any rectal bleeding but this this is not typical as she typically bleeds several times per week. She underwent colonoscopy in 02/2012 with piecemeal polypectomy of a distal rectal tubulovillous adenoma. She was advised to return for flexible sigmoidoscopy in 3-6 months to assess for completeness of polypectomy however she did not return. She states she is in a study followed by [facility] and she states she is either receiving aspirin or Pradaxa in a blinded fashion. Denies weight loss, abdominal pain, constipation, diarrhea, change in stool caliber, melena, nausea, vomiting, dysphagia, reflux symptoms, chest pain.   Current Medications, Allergies, Past Medical History, Past Surgical History, Family History and Social History were reviewed in [employer] record.  Physical Exam: General: Well developed , well nourished, no acute distress Head: Normocephalic and atraumatic Eyes:  sclerae anicteric, EOMI Ears: Normal auditory acuity Mouth: No deformity or lesions Lungs: Clear throughout to auscultation Heart: Regular rate and rhythm; no murmurs, rubs or bruits Abdomen: Soft, non tender and non distended. No masses, hepatosplenomegaly or hernias noted. Normal Bowel sounds Rectal: Deferred to colonoscopy Musculoskeletal: Symmetrical with no gross deformities  Pulses:  Normal pulses noted Extremities: No clubbing, cyanosis, edema or deformities noted Neurological: Alert oriented x 4, grossly nonfocal Psychological:  Alert and cooperative. Normal mood and affect  Assessment and Recommendations:  1. Personal history of tubulovillous adenoma of the rectum status post piecemeal polypectomy in 2013. Hematochezia. Over due from recommended follow  up. Rule out hemorrhoids recurrent, colorectal neoplasms and other disorders. Schedule colonoscopy. The risks (including bleeding, perforation, infection, missed lesions, medication reactions and possible hospitalization or surgery if complications occur), benefits, and alternatives to colonoscopy with possible biopsy and possible polypectomy were discussed with the patient and they consent to proceed.   2. Will hold study drug for 2 days prior to endoscopic procedures - will instruct when and how to resume after procedure. Rare but real risk of stroke or other vascular clotting events off study drug also explained and need to seek urgent help if any signs of these problems occur. Will communicate by phone or EMR with patient's prescribing provider to confirm that holding study drug is reasonable in this case.

## 2014-06-20 NOTE — Telephone Encounter (Signed)
Hi, Amanda:  Thanks for the message. We have checked the research protocol of the trial (see below). In brief, if ASA is not too much concern for the GI procedure, the investigational drug should be stopped 1-2 days before the procedure, and resume post op as soon as the patient is hemodynamically stable and hemostasis is achieved. Please let me know if you or Dr. Fuller Plan have further questions. Thanks.  Rosalin Hawking, MD PhD Stroke Neurology 06/20/2014 5:39 PM

## 2014-06-21 NOTE — Telephone Encounter (Signed)
Patient states her Neurologist has already contacted her about stopping her study drug and was told to stop it 2 days before her procedure. Pt verbalized understanding.

## 2014-06-21 NOTE — Telephone Encounter (Signed)
See note

## 2014-07-02 ENCOUNTER — Ambulatory Visit (AMBULATORY_SURGERY_CENTER): Payer: Medicare Other | Admitting: Gastroenterology

## 2014-07-02 ENCOUNTER — Encounter: Payer: Self-pay | Admitting: Gastroenterology

## 2014-07-02 VITALS — BP 129/77 | HR 63 | Temp 97.1°F | Resp 18 | Ht 65.0 in | Wt 139.0 lb

## 2014-07-02 DIAGNOSIS — D128 Benign neoplasm of rectum: Secondary | ICD-10-CM | POA: Diagnosis not present

## 2014-07-02 DIAGNOSIS — Z860100 Personal history of colon polyps, unspecified: Secondary | ICD-10-CM

## 2014-07-02 DIAGNOSIS — Z8673 Personal history of transient ischemic attack (TIA), and cerebral infarction without residual deficits: Secondary | ICD-10-CM | POA: Diagnosis not present

## 2014-07-02 DIAGNOSIS — K921 Melena: Secondary | ICD-10-CM | POA: Diagnosis not present

## 2014-07-02 DIAGNOSIS — D129 Benign neoplasm of anus and anal canal: Secondary | ICD-10-CM

## 2014-07-02 DIAGNOSIS — K219 Gastro-esophageal reflux disease without esophagitis: Secondary | ICD-10-CM | POA: Diagnosis not present

## 2014-07-02 MED ORDER — SODIUM CHLORIDE 0.9 % IV SOLN: 500.0000 mL | INTRAVENOUS | Status: DC

## 2014-07-02 NOTE — Progress Notes (Signed)
Report to PACU, RN, vss, BBS= Clear.  

## 2014-07-02 NOTE — Op Note (Signed)
[place]  [address] [place], [address]   COLONOSCOPY PROCEDURE REPORT  PATIENT: Felicia, Hunter  MR#: [id number] BIRTHDATE: [birth year] , 74  yrs. old GENDER: female ENDOSCOPIST: Ladene Artist, MD, [facility] REFERRED [id number] Delight Hoh, M.D. PROCEDURE DATE:  07/02/2014 PROCEDURE:   Colonoscopy, diagnostic and Colonoscopy with snare polypectomy First Screening Colonoscopy - Avg.  risk and is 50 yrs.  old or older - No.  Prior Negative Screening - Now for repeat screening. N/A  History of Adenoma - Now for follow-up colonoscopy & has been > or = to 3 yrs.  Yes hx of adenoma.  Has been 3 or more years since last colonoscopy. ASA CLASS:   Class III INDICATIONS:Surveillance due to prior colonic neoplasia, Evaluation of unexplained GI bleeding, PH Colon Adenoma, and hematochezia. MEDICATIONS: Monitored anesthesia care and Propofol 300 mg IV DESCRIPTION OF PROCEDURE:   After the risks benefits and alternatives of the procedure were thoroughly explained, informed consent was obtained.  The digital rectal exam revealed a palpable rectal mass.   The LB [id number]  endoscope was introduced through the anus and advanced to the cecum, which was identified by both the appendix and ileocecal valve. No adverse events experienced.   The quality of the prep was good.  (MoviPrep was used)  The instrument was then slowly withdrawn as the colon was fully examined.    COLON FINDINGS: A semi-pedunculated polyp measuring 12 mm in size was found in the distal rectum just above the dentate line.  A polypectomy was performed using snare cautery.  The resection was complete, the polyp tissue was completely retrieved and sent to histology.   There was mild diverticulosis noted in the sigmoid colon.   The examination was otherwise normal.  Distal rectal polyp as described.. The time to cecum = 4.3 Withdrawal time = 12.9   The scope was withdrawn and the procedure  completed. COMPLICATIONS: There were no immediate complications.  ENDOSCOPIC IMPRESSION: 1.   Semi-pedunculated polyp in the rectum; polypectomy performed using snare cautery 2.   Mild diverticulosis in the sigmoid colon  RECOMMENDATIONS: 1.  Hold Aspirin and all other NSAIDS for 2 weeks. 2.  Resume study drug in 2 days 3.  Await pathology results 4.  Flexible sigmoidoscopy to assess for complete polypectomy and repeat Colonoscopy in 3 years.  eSigned:  Ladene Artist, MD, [facility] 07/02/2014 3:15 PM    PATIENT NAME:  Felicia, Hunter MR#: [id number]

## 2014-07-02 NOTE — Patient Instructions (Signed)
YOU HAD AN ENDOSCOPIC PROCEDURE TODAY AT [facility]:   Refer to the procedure report that was given to you for any specific questions about what was found during the examination.  If the procedure report does not answer your questions, please call your gastroenterologist to clarify.  If you requested that your care partner not be given the details of your procedure findings, then the procedure report has been included in a sealed envelope for you to review at your convenience later.  YOU SHOULD EXPECT: Some feelings of bloating in the abdomen. Passage of more gas than usual.  Walking can help get rid of the air that was put into your GI tract during the procedure and reduce the bloating. If you had a lower endoscopy (such as a colonoscopy or flexible sigmoidoscopy) you may notice spotting of blood in your stool or on the toilet paper. If you underwent a bowel prep for your procedure, you may not have a normal bowel movement for a few days.  Please Note:  You might notice some irritation and congestion in your nose or some drainage.  This is from the oxygen used during your procedure.  There is no need for concern and it should clear up in a day or so.  SYMPTOMS TO REPORT IMMEDIATELY:   Following lower endoscopy (colonoscopy or flexible sigmoidoscopy):  Excessive amounts of blood in the stool  Significant tenderness or worsening of abdominal pains  Swelling of the abdomen that is new, acute  Fever of 100F or higher    For urgent or emergent issues, a gastroenterologist can be reached at any hour by calling [phone].   DIET: Your first meal following the procedure should be a small meal and then it is ok to progress to your normal diet. Heavy or fried foods are harder to digest and may make you feel nauseous or bloated.  Likewise, meals heavy in dairy and vegetables can increase bloating.  Drink plenty of fluids but you should avoid alcoholic beverages for 24  hours.  ACTIVITY:  You should plan to take it easy for the rest of today and you should NOT DRIVE or use heavy machinery until tomorrow (because of the sedation medicines used during the test).    FOLLOW UP: Our staff will call the number listed on your records the next business day following your procedure to check on you and address any questions or concerns that you may have regarding the information given to you following your procedure. If we do not reach you, we will leave a message.  However, if you are feeling well and you are not experiencing any problems, there is no need to return our call.  We will assume that you have returned to your regular daily activities without incident.  If any biopsies were taken you will be contacted by phone or by letter within the next 1-3 weeks.  Please call us at [phone] if you have not heard about the biopsies in 3 weeks.    SIGNATURES/CONFIDENTIALITY: You and/or your care partner have signed paperwork which will be entered into your electronic medical record.  These signatures attest to the fact that that the information above on your After Visit Summary has been reviewed and is understood.  Full responsibility of the confidentiality of this discharge information lies with you and/or your care-partner.   Hold Aspirin and all nsaids for 2 weeks, Resume study drug in 2 days. Information given on polyps,diverticulosis and high fiber diet  with discharge instructions.

## 2014-07-02 NOTE — Progress Notes (Signed)
Called to room to assist during endoscopic procedure.  Patient ID and intended procedure confirmed with present staff. Received instructions for my participation in the procedure from the performing physician.  

## 2014-07-03 ENCOUNTER — Telehealth: Payer: Self-pay

## 2014-07-03 NOTE — Telephone Encounter (Signed)
  Follow up Call-  Call back number 07/02/2014 02/21/2012  Post procedure Call Back phone  # [phone]  Permission to leave phone message Yes Yes     Patient questions:  Do you have a fever, pain , or abdominal swelling? No. Pain Score  0 *  Have you tolerated food without any problems? Yes.    Have you been able to return to your normal activities? Yes.    Do you have any questions about your discharge instructions: Diet   No. Medications  No. Follow up visit  No.  Do you have questions or concerns about your Care? No.  Actions: * If pain score is 4 or above: No action needed, pain <4.

## 2014-07-05 ENCOUNTER — Ambulatory Visit (INDEPENDENT_AMBULATORY_CARE_PROVIDER_SITE_OTHER): Payer: Medicare Other | Admitting: *Deleted

## 2014-07-05 DIAGNOSIS — I639 Cerebral infarction, unspecified: Secondary | ICD-10-CM

## 2014-07-10 ENCOUNTER — Encounter: Payer: Self-pay | Admitting: Gastroenterology

## 2014-07-10 NOTE — Progress Notes (Signed)
Loop recorder 

## 2014-07-12 ENCOUNTER — Encounter: Payer: Self-pay | Admitting: Internal Medicine

## 2014-07-29 ENCOUNTER — Encounter (INDEPENDENT_AMBULATORY_CARE_PROVIDER_SITE_OTHER): Payer: Self-pay

## 2014-07-29 DIAGNOSIS — Z0289 Encounter for other administrative examinations: Secondary | ICD-10-CM

## 2014-08-01 LAB — CUP PACEART REMOTE DEVICE CHECK
MDC IDC SESS DTM: 20160429154742
MDC IDC SET ZONE DETECTION INTERVAL: 2000 ms
Zone Setting Detection Interval: 3000 ms
Zone Setting Detection Interval: 380 ms

## 2014-08-05 ENCOUNTER — Ambulatory Visit (INDEPENDENT_AMBULATORY_CARE_PROVIDER_SITE_OTHER): Payer: Medicare Other | Admitting: *Deleted

## 2014-08-05 DIAGNOSIS — I639 Cerebral infarction, unspecified: Secondary | ICD-10-CM | POA: Diagnosis not present

## 2014-08-06 NOTE — Progress Notes (Signed)
Loop recorder 

## 2014-08-16 LAB — CUP PACEART REMOTE DEVICE CHECK: Date Time Interrogation Session: 20160603123349

## 2014-09-02 ENCOUNTER — Encounter: Payer: Self-pay | Admitting: Internal Medicine

## 2014-09-03 ENCOUNTER — Ambulatory Visit (INDEPENDENT_AMBULATORY_CARE_PROVIDER_SITE_OTHER): Payer: Medicare Other | Admitting: *Deleted

## 2014-09-03 ENCOUNTER — Encounter: Payer: Self-pay | Admitting: Internal Medicine

## 2014-09-03 DIAGNOSIS — I639 Cerebral infarction, unspecified: Secondary | ICD-10-CM | POA: Diagnosis not present

## 2014-09-04 NOTE — Progress Notes (Signed)
Loop recorder 

## 2014-09-09 ENCOUNTER — Other Ambulatory Visit: Payer: Self-pay

## 2014-09-09 DIAGNOSIS — D225 Melanocytic nevi of trunk: Secondary | ICD-10-CM | POA: Diagnosis not present

## 2014-09-09 DIAGNOSIS — D1801 Hemangioma of skin and subcutaneous tissue: Secondary | ICD-10-CM | POA: Diagnosis not present

## 2014-09-09 DIAGNOSIS — D2262 Melanocytic nevi of left upper limb, including shoulder: Secondary | ICD-10-CM | POA: Diagnosis not present

## 2014-09-09 DIAGNOSIS — L80 Vitiligo: Secondary | ICD-10-CM | POA: Diagnosis not present

## 2014-09-10 LAB — CUP PACEART REMOTE DEVICE CHECK: Date Time Interrogation Session: 20160628102232

## 2014-09-25 ENCOUNTER — Encounter: Payer: Self-pay | Admitting: Internal Medicine

## 2014-09-30 DIAGNOSIS — Z853 Personal history of malignant neoplasm of breast: Secondary | ICD-10-CM | POA: Diagnosis not present

## 2014-10-02 ENCOUNTER — Encounter: Payer: Self-pay | Admitting: Gastroenterology

## 2014-10-03 ENCOUNTER — Ambulatory Visit (INDEPENDENT_AMBULATORY_CARE_PROVIDER_SITE_OTHER): Payer: Medicare Other

## 2014-10-03 DIAGNOSIS — I639 Cerebral infarction, unspecified: Secondary | ICD-10-CM

## 2014-10-10 ENCOUNTER — Ambulatory Visit: Payer: Medicare Other | Admitting: Neurology

## 2014-10-16 NOTE — Progress Notes (Signed)
Loop recorder 

## 2014-10-24 ENCOUNTER — Other Ambulatory Visit (HOSPITAL_BASED_OUTPATIENT_CLINIC_OR_DEPARTMENT_OTHER): Payer: Medicare Other

## 2014-10-24 ENCOUNTER — Ambulatory Visit (HOSPITAL_BASED_OUTPATIENT_CLINIC_OR_DEPARTMENT_OTHER): Payer: Medicare Other | Admitting: Hematology & Oncology

## 2014-10-24 ENCOUNTER — Encounter: Payer: Self-pay | Admitting: Hematology & Oncology

## 2014-10-24 VITALS — BP 145/71 | HR 66 | Temp 97.3°F | Resp 20 | Ht 65.0 in | Wt 144.0 lb

## 2014-10-24 DIAGNOSIS — E559 Vitamin D deficiency, unspecified: Secondary | ICD-10-CM

## 2014-10-24 DIAGNOSIS — I6310 Cerebral infarction due to embolism of unspecified precerebral artery: Secondary | ICD-10-CM | POA: Diagnosis not present

## 2014-10-24 DIAGNOSIS — D0511 Intraductal carcinoma in situ of right breast: Secondary | ICD-10-CM | POA: Diagnosis not present

## 2014-10-24 DIAGNOSIS — Z853 Personal history of malignant neoplasm of breast: Secondary | ICD-10-CM

## 2014-10-24 LAB — COMPREHENSIVE METABOLIC PANEL
ALK PHOS: 42 U/L (ref 33–130)
ALT: 14 U/L (ref 6–29)
AST: 18 U/L (ref 10–35)
Albumin: 4.1 g/dL (ref 3.6–5.1)
BUN: 15 mg/dL (ref 7–25)
CO2: 27 mmol/L (ref 20–31)
CREATININE: 0.84 mg/dL (ref 0.60–0.93)
Calcium: 9.9 mg/dL (ref 8.6–10.4)
Chloride: 105 mmol/L (ref 98–110)
Glucose, Bld: 100 mg/dL — ABNORMAL HIGH (ref 65–99)
Potassium: 4.2 mmol/L (ref 3.5–5.3)
Sodium: 141 mmol/L (ref 135–146)
Total Bilirubin: 1.5 mg/dL — ABNORMAL HIGH (ref 0.2–1.2)
Total Protein: 6.4 g/dL (ref 6.1–8.1)

## 2014-10-24 LAB — CBC WITH DIFFERENTIAL (CANCER CENTER ONLY)
BASO#: 0.1 10*3/uL (ref 0.0–0.2)
BASO%: 1 % (ref 0.0–2.0)
EOS ABS: 0.1 10*3/uL (ref 0.0–0.5)
EOS%: 2.4 % (ref 0.0–7.0)
HCT: 42.1 % (ref 34.8–46.6)
HGB: 13.9 g/dL (ref 11.6–15.9)
LYMPH#: 1.3 10*3/uL (ref 0.9–3.3)
LYMPH%: 22.8 % (ref 14.0–48.0)
MCH: 32.3 pg (ref 26.0–34.0)
MCHC: 33 g/dL (ref 32.0–36.0)
MCV: 98 fL (ref 81–101)
MONO#: 0.5 10*3/uL (ref 0.1–0.9)
MONO%: 9.1 % (ref 0.0–13.0)
NEUT#: 3.8 10*3/uL (ref 1.5–6.5)
NEUT%: 64.7 % (ref 39.6–80.0)
PLATELETS: 237 10*3/uL (ref 145–400)
RBC: 4.3 10*6/uL (ref 3.70–5.32)
RDW: 12.8 % (ref 11.1–15.7)
WBC: 5.8 10*3/uL (ref 3.9–10.0)

## 2014-10-24 NOTE — Progress Notes (Signed)
Hematology and Oncology Follow Up Visit  Felicia Hunter [id number] 11-Jan-1941 74 y.o. 10/24/2014   Principle Diagnosis:   DCIS of the right breast  Cortical stroke of the left MCA-no residual deficit  Current Therapy:    Clinical trial for the CVA     Interim History:  Felicia Hunter is back for follow-up. She is seen yearly. However, since we last saw her, she had a CVA. This manifested as some word difficulty and some incoordination. She was subsequently placed in a clinical trial. She is on an investigational medicine.  She looks good. She sounds good.  She will be going to the [facility] in March of next year. She really is looking for to this.  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  She's had no bleeding. She's had no issues with blood sugars. She's had no change in bowel or bladder habits. She has had bilateral mastectomy so she has not required any mammograms.  She's had no cough. She's had no fever. There's been no rashes. She's had no leg swelling.  Overall, her performance status is ECOG 1.    Medications:  Current outpatient prescriptions:  .  atorvastatin (LIPITOR) 20 MG tablet, Take 20 mg by mouth every evening. , Disp: , Rfl:  .  BIOTIN PO, Take 1 tablet by mouth daily., Disp: , Rfl:  .  calcium carbonate (CALCIUM 600) 600 MG TABS, Take 600 mg by mouth daily. , Disp: , Rfl:  .  cholecalciferol (VITAMIN D) 1000 UNITS tablet, Take 1,000 Units by mouth daily., Disp: , Rfl:  .  diphenhydramine-acetaminophen (TYLENOL PM) 25-500 MG TABS, Take 1 tablet by mouth at bedtime as needed., Disp: , Rfl:  .  glucosamine-chondroitin 500-400 MG tablet, Take 1 tablet by mouth daily. , Disp: , Rfl:  .  Multiple Vitamins-Minerals (MULTIVITAMIN & MINERAL PO), Take by mouth every morning., Disp: , Rfl:  .  NON FORMULARY, May be aspirin, pt in pradax study., Disp: , Rfl:  .  NON FORMULARY, Take 1 capsule by mouth 2 (two) times daily. Trial medication, pt in blind study for pradaxa., Disp: ,  Rfl:  .  omega-3 acid ethyl esters (LOVAZA) 1 G capsule, Take 1 g by mouth 2 (two) times daily. , Disp: , Rfl:  .  vitamin C (ASCORBIC ACID) 500 MG tablet, Take 500 mg by mouth as needed., Disp: , Rfl:   Allergies:  Allergies  Allergen Reactions  . Sulfonamide Derivatives Rash    Past Medical History, Surgical history, Social history, and Family History were reviewed and updated.  Review of Systems: As above  Physical Exam:  height is 5\' 5"  (1.651 m) and weight is 144 lb (65.318 kg). Her oral temperature is 97.3 F (36.3 C). Her blood pressure is 145/71 and her pulse is 66. Her respiration is 20.   Wt Readings from Last 3 Encounters:  10/24/14 144 lb (65.318 kg)  07/02/14 139 lb (63.05 kg)  06/19/14 139 lb 9.6 oz (63.322 kg)     Well-developed and well-nourished white female in no obvious distress. Head and neck exam shows no ocular or oral lesions. She has no palpable cervical or supraclavicular lymph nodes. Lungs are clear. Cardiac exam regular rate and rhythm with no murmurs, rubs or bruits. Chest wall exam shows bilateral mastectomies. She has no chest wall nodules. There is no erythema on the chest wall. There is no bilateral axillary adenopathy. Abdomen is soft. She has good bowel sounds. There is no fluid wave. There is  no palpable liver or spleen tip. Back exam shows no kyphosis. There is no tenderness over the spine, ribs or hips. Extremities shows no clubbing, cyanosis or edema. She has some age related osteoarthritic changes. She has good range of motion of her joints. She has good strength in her extremities. Skin exam shows no rashes, ecchymoses or petechia. Neurological exam shows no focal neurological deficits.  Lab Results  Component Value Date   WBC 5.8 10/24/2014   HGB 13.9 10/24/2014   HCT 42.1 10/24/2014   MCV 98 10/24/2014   PLT 237 10/24/2014     Chemistry      Component Value Date/Time   NA 139 11/05/2013 1115   K 4.1 11/05/2013 1115   CL 103 11/05/2013  1115   CO2 22 11/05/2013 1115   BUN 12 11/05/2013 1115   CREATININE 0.73 11/05/2013 1115      Component Value Date/Time   CALCIUM 10.2 11/05/2013 1115   ALKPHOS 70 11/05/2013 1115   AST 22 11/05/2013 1115   ALT 19 11/05/2013 1115   BILITOT 1.0 11/05/2013 1115         Impression and Plan: Felicia Hunter is 73 year old white female. She has a past history of ductal carcinoma in situ of the right breast. She is doing well. She was diagnosed 5 years ago. Thankfully, she was not on any type of anti-estrogen (i.e. tamoxifen) when she had this CVA.  From my point of view, I don't think we have to do anything different with Felicia Hunter.  We will see her back in one year. I'll afford to her shown [place] pictures of her trip to the [place].   Volanda Napoleon, MD 8/11/20165:29 PM

## 2014-10-25 LAB — CUP PACEART REMOTE DEVICE CHECK: Date Time Interrogation Session: 20160812153915

## 2014-11-01 ENCOUNTER — Encounter: Payer: Medicare Other | Admitting: *Deleted

## 2014-11-02 ENCOUNTER — Encounter: Payer: Self-pay | Admitting: Internal Medicine

## 2014-11-06 ENCOUNTER — Encounter: Payer: Self-pay | Admitting: Neurology

## 2014-11-06 ENCOUNTER — Ambulatory Visit (INDEPENDENT_AMBULATORY_CARE_PROVIDER_SITE_OTHER): Payer: Self-pay | Admitting: Neurology

## 2014-11-06 VITALS — BP 116/70 | HR 67 | Ht 65.0 in | Wt 144.2 lb

## 2014-11-06 DIAGNOSIS — I6310 Cerebral infarction due to embolism of unspecified precerebral artery: Secondary | ICD-10-CM

## 2014-11-08 NOTE — Progress Notes (Signed)
STROKE NEUROLOGY FOLLOW UP NOTE  NAME: Felicia Hunter DOB: 02-Jan-1941  REASON FOR VISIT: stroke follow up HISTORY FROM: pt and chart  Today we had the pleasure of seeing Felicia Hunter in follow-up at our Neurology Clinic. Pt was accompanied by no one.   History Summary Felicia Hunter is an 74 y.o. female with previous CVA in 2013 and no residual symptoms. Patient was by herself on 11/04/2013 when around 4PM she noted she was having a hard time starting her car and misspelled words. Her daughter noted she had some word substitution and told her to called her PCP. She called her primary MD second day who stated she should be seen in ED. She came to [facility] ER and symptoms gradually getting better. MRI showed left MCA cortical stroke with old stroke at left cerebellar and right parietal region. Concerning for emboli pattern. TEE negative. Loop recorder placed before discharge. She was discharged with full dose ASA and lipitor.  12/20/13 follow up - the patient has been doing well. No recurrent symptoms and her speech is normal. Loop recorder so far no AF episodes. She is still on ASA and lipitor. We discussed REPECT-ESUS trial with her.    04/11/14 follow up - pt has been doing well. No recurrent stroke symptoms. She was enrolled to REPECT-ESUS trial in 01/2015. Currently taking the investigational medication without side effect. She has no complaints today. Blood pressure today 101/64 in clinic. Loop recorder so far no afib episodes.  Interval History During the interval time, pt has been doing well. No recurrent stroke like symptoms. On investigational drugs, no bleeding. No bruise. No excessive bleeding even with wound. No other complains. BP 116/70. Also seeing research visit today too.   REVIEW OF SYSTEMS: Full 14 system review of systems performed and notable only for those listed below and in HPI above, all others are negative:  Constitutional: N/A  Cardiovascular: N/A  Ear/Nose/Throat: N/A   Skin: Rash  Eyes: N/A  Respiratory: N/A  Gastroitestinal: N/A  Genitourinary: N/A Hematology/Lymphatic: N/A  Endocrine: N/A  Musculoskeletal: joint pain Allergy/Immunology: N/A  Neurological: N/A  Psychiatric: N/A  The following represents the patient's updated allergies and side effects list: Allergies  Allergen Reactions  . Sulfonamide Derivatives Rash    Labs since last visit of relevance include the following: Results for orders placed or performed in visit on 10/24/14  CBC with Differential [facility])  Result Value Ref Range   WBC 5.8 3.9 - 10.0 10e3/uL   RBC 4.30 3.70 - 5.32 10e6/uL   HGB 13.9 11.6 - 15.9 g/dL   HCT 42.1 34.8 - 46.6 %   MCV 98 81 - 101 fL   MCH 32.3 26.0 - 34.0 pg   MCHC 33.0 32.0 - 36.0 g/dL   RDW 12.8 11.1 - 15.7 %   Platelets 237 145 - 400 10e3/uL   NEUT# 3.8 1.5 - 6.5 10e3/uL   LYMPH# 1.3 0.9 - 3.3 10e3/uL   MONO# 0.5 0.1 - 0.9 10e3/uL   Eosinophils Absolute 0.1 0.0 - 0.5 10e3/uL   BASO# 0.1 0.0 - 0.2 10e3/uL   NEUT% 64.7 39.6 - 80.0 %   LYMPH% 22.8 14.0 - 48.0 %   MONO% 9.1 0.0 - 13.0 %   EOS% 2.4 0.0 - 7.0 %   BASO% 1.0 0.0 - 2.0 %  Comprehensive metabolic panel  Result Value Ref Range   Sodium 141 135 - 146 mmol/L   Potassium 4.2 3.5 - 5.3 mmol/L  Chloride 105 98 - 110 mmol/L   CO2 27 20 - 31 mmol/L   Glucose, Bld 100 (H) 65 - 99 mg/dL   BUN 15 7 - 25 mg/dL   Creatinine, Ser 0.84 0.60 - 0.93 mg/dL   Total Bilirubin 1.5 (H) 0.2 - 1.2 mg/dL   Alkaline Phosphatase 42 33 - 130 U/L   AST 18 10 - 35 U/L   ALT 14 6 - 29 U/L   Total Protein 6.4 6.1 - 8.1 g/dL   Albumin 4.1 3.6 - 5.1 g/dL   Calcium 9.9 8.6 - 10.4 mg/dL    The neurologically relevant items on the patient's problem list were reviewed on today's visit.  Neurologic Examination  A problem focused neurological exam (12 or more points of the single system neurologic examination, vital signs counts as 1 point, cranial nerves count for 8 points) was  performed.  Blood pressure 116/70, pulse 67, height 5\' 5"  (1.651 m), weight 144 lb 3.2 oz (65.409 kg).  General - Well nourished, well developed, in no apparent distress.  Ophthalmologic - not able to see through.  Cardiovascular - Regular rate and rhythm with no murmur.  Mental Status -  Level of arousal and orientation to time, place, and person were intact. Language including expression, naming, repetition, comprehension, reading, and writing was assessed and found intact. Fund of Knowledge was assessed and was intact.  Cranial Nerves II - XII - II - Visual field intact OU. III, IV, VI - Extraocular movements intact. V - Facial sensation intact bilaterally. VII - Facial movement intact bilaterally. VIII - Hearing & vestibular intact bilaterally. X - Palate elevates symmetrically. XI - Chin turning & shoulder shrug intact bilaterally. XII - Tongue protrusion intact.  Motor Strength - The patient's strength was normal in all extremities and pronator drift was absent.  Bulk was normal and fasciculations were absent.   Motor Tone - Muscle tone was assessed at the neck and appendages and was normal.  Reflexes - The patient's reflexes were normal in all extremities and she had no pathological reflexes.  Sensory - Light touch, temperature/pinprick were assessed and were normal.    Coordination - The patient had normal movements in the hands and feet with no ataxia or dysmetria.  Tremor was absent.  Gait and Station - slow but steady.  Data reviewed: I personally reviewed the images and agree with the radiology interpretations.  Ct Head (brain) Wo Contrast  11/05/2013  IMPRESSION: 1. Acute ischemic change in the left parietal lobe is present consistent with an evolving ischemic infarction. Encephalomalacia in the right parietal lobe likely reflects previous ischemic insult. 2. There are old infarcts in the left cerebellar hemisphere. 3. Next item there is no acute intracranial  hemorrhage.    Mri and Mra Head Wo Contrast  11/05/2013 IMPRESSION: Acute infarction affecting the left insula and frontoparietal cortical and subcortical brain consistent with left MCA branch vessel territory infarction. Mild swelling but no hemorrhage or mass effect. Old ischemic changes elsewhere throughout the brain as outlined above. 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   CUS - The vertebral arteries appear patent with antegrade flow. - Findings consistent with 1- 39 percent stenosis involving the right internal carotid artery and the left internal carotid artery.  2D echo - - Left ventricle: The cavity size was normal. Systolic function was normal. The estimated ejection fraction was in the range of 55% to 60%. Wall motion was normal;  there were no regional wall motion abnormalities. - Left atrium: The atrium was mildly dilated. - Atrial septum: Not well visualized no obvious ASA/PFO. There was increased thickness of the septum, consistent with lipomatous hypertrophy.  TEE - Left ventricle: Systolic function was normal. Wall motion was normal; there were no regional wall motion abnormalities. - Mitral valve: There was mild regurgitation. - Left atrium: No evidence of thrombus in the atrial cavity or appendage. No evidence of thrombus in the atrial cavity or appendage. - Right atrium: No evidence of thrombus in the atrial cavity or appendage. - Atrial septum: There was increased thickness of the septum, consistent with lipomatous hypertrophy. No defect or patent foramen ovale was identified. Impressions: - No cardiac source of emboli was indentified  Component     Latest Ref Rng 11/06/2013  Cholesterol     0 - 200 mg/dL 159  Triglycerides     <150 mg/dL 69  HDL     >39 mg/dL 88  Total CHOL/HDL Ratio      1.8  VLDL     0 - 40 mg/dL 14  LDL (calc)     0 - 99 mg/dL 57  Hemoglobin A1C     <5.7 % 5.8 (H)   Mean Plasma Glucose     <117 mg/dL 120 (H)    Assessment: As you may recall, she is a 74 y.o. Caucasian female with PMH of CVA in 2013 without residue and HTN was admitted to [facility] due to left MCA stroke in 10/2013. However, MRI also showed mulitple old stroke at left cerebellar and right parietal cortex. Pattern consistent with embolic stroke. Stroke work up showed TEE negative and she was put on loop recorder, so far sinus rhythm, no A. fib episodes. She dose not have much stroke risk factors. She was enrolled in RESPECT trial and tolerating with investigational drugs. Doing well during the interval time. Loop recorder no afib.  Plan:  - Continue RESPECT ESUS medications - continue liptor for stroke prevention. - Follow up with your primary care physician for stroke risk factor modification. Recommend maintain blood pressure goal <130/80, diabetes with hemoglobin A1c goal below 6.5% and lipids with LDL cholesterol goal below 70 mg/dL.  - Follow up with loop recording - will continue to follow up in research visits.  No orders of the defined types were placed in this encounter.    No orders of the defined types were placed in this encounter.    Patient Instructions  - Continue investigational medications - continue liptor for stroke prevention. - Follow up with your primary care physician for stroke risk factor modification. Recommend maintain blood pressure goal <130/80, diabetes with hemoglobin A1c goal below 6.5% and lipids with LDL cholesterol goal below 70 mg/dL.  - Follow up with loop recording - will continue to follow up in research visits.    Felicia Hawking, MD PhD [facility] [address] [place], [place] [address] [phone]

## 2014-11-08 NOTE — Patient Instructions (Addendum)
-   Continue investigational medications - continue liptor for stroke prevention. - Follow up with your primary care physician for stroke risk factor modification. Recommend maintain blood pressure goal <130/80, diabetes with hemoglobin A1c goal below 6.5% and lipids with LDL cholesterol goal below 70 mg/dL.  - Follow up with loop recording - will continue to follow up in research visits.

## 2014-11-13 ENCOUNTER — Encounter: Payer: Self-pay | Admitting: Internal Medicine

## 2014-11-14 DIAGNOSIS — Z96641 Presence of right artificial hip joint: Secondary | ICD-10-CM | POA: Diagnosis not present

## 2014-11-14 DIAGNOSIS — Z471 Aftercare following joint replacement surgery: Secondary | ICD-10-CM | POA: Diagnosis not present

## 2014-11-14 DIAGNOSIS — M1612 Unilateral primary osteoarthritis, left hip: Secondary | ICD-10-CM | POA: Diagnosis not present

## 2014-11-20 ENCOUNTER — Telehealth: Payer: Self-pay | Admitting: Neurology

## 2014-11-20 NOTE — Telephone Encounter (Signed)
Pt called and states that [facility] faxed a form to clear her for surgery. She just wants to know if it was received and faxed back to them. Please call and advise [phone], or [phone]

## 2014-11-21 NOTE — Telephone Encounter (Signed)
Patient was asking about form to be cleared for surgery. Rn explain to pt that Dr Erlinda Hong is currently covering the hospital until next week. Rn explain that I will try to get the working Md to filled the form out. If that's not possible, I will get Dr. Erlinda Hong to filled it out when he is done with rounds in the hospital.

## 2014-11-26 NOTE — Telephone Encounter (Signed)
Clearance form sent to [facility].

## 2014-11-26 NOTE — Telephone Encounter (Signed)
Hi, Katrina:  Could you please let Dr. Leonie Man fill out the form? I will be in office next Monday. Thanks.  Rosalin Hawking, MD PhD Stroke Neurology 11/26/2014 4:39 PM

## 2014-12-02 ENCOUNTER — Ambulatory Visit (INDEPENDENT_AMBULATORY_CARE_PROVIDER_SITE_OTHER): Payer: Medicare Other | Admitting: *Deleted

## 2014-12-02 DIAGNOSIS — I639 Cerebral infarction, unspecified: Secondary | ICD-10-CM

## 2014-12-04 NOTE — Progress Notes (Signed)
Loop recorder 

## 2014-12-07 LAB — CUP PACEART REMOTE DEVICE CHECK: MDC IDC SESS DTM: 20160919213648

## 2014-12-07 NOTE — Progress Notes (Signed)
[facility]link summary report received. Battery status OK. Normal device function. No new symptom episodes, tachy episodes, brady, or pause episodes. No new AF episodes. Monthly summary reports and ROV with GT on 01/31/15 at 3:00pm.

## 2014-12-09 DIAGNOSIS — Z23 Encounter for immunization: Secondary | ICD-10-CM | POA: Diagnosis not present

## 2014-12-11 ENCOUNTER — Encounter: Payer: Self-pay | Admitting: Gastroenterology

## 2014-12-12 DIAGNOSIS — M1612 Unilateral primary osteoarthritis, left hip: Secondary | ICD-10-CM | POA: Diagnosis not present

## 2014-12-17 DIAGNOSIS — Z8673 Personal history of transient ischemic attack (TIA), and cerebral infarction without residual deficits: Secondary | ICD-10-CM | POA: Diagnosis not present

## 2014-12-17 DIAGNOSIS — Z01818 Encounter for other preprocedural examination: Secondary | ICD-10-CM | POA: Diagnosis not present

## 2014-12-17 DIAGNOSIS — Z7901 Long term (current) use of anticoagulants: Secondary | ICD-10-CM | POA: Diagnosis not present

## 2014-12-17 DIAGNOSIS — E550 Rickets, active: Secondary | ICD-10-CM | POA: Diagnosis not present

## 2014-12-17 DIAGNOSIS — E7800 Pure hypercholesterolemia, unspecified: Secondary | ICD-10-CM | POA: Diagnosis not present

## 2014-12-17 DIAGNOSIS — M8580 Other specified disorders of bone density and structure, unspecified site: Secondary | ICD-10-CM | POA: Diagnosis not present

## 2014-12-18 ENCOUNTER — Encounter: Payer: Self-pay | Admitting: Internal Medicine

## 2014-12-25 ENCOUNTER — Encounter (HOSPITAL_COMMUNITY): Payer: Self-pay

## 2014-12-25 ENCOUNTER — Encounter (HOSPITAL_COMMUNITY)
Admission: RE | Admit: 2014-12-25 | Discharge: 2014-12-25 | Disposition: A | Discharge status: Home / Self Care | Payer: Medicare Other | Source: Ambulatory Visit | Attending: Orthopedic Surgery | Admitting: Orthopedic Surgery

## 2014-12-25 DIAGNOSIS — M1612 Unilateral primary osteoarthritis, left hip: Secondary | ICD-10-CM | POA: Diagnosis not present

## 2014-12-25 DIAGNOSIS — Z01818 Encounter for other preprocedural examination: Secondary | ICD-10-CM | POA: Insufficient documentation

## 2014-12-25 HISTORY — DX: Other allergic rhinitis: J30.89

## 2014-12-25 LAB — SURGICAL PCR SCREEN
MRSA, PCR: NEGATIVE
STAPHYLOCOCCUS AUREUS: NEGATIVE

## 2014-12-25 LAB — PROTIME-INR
INR: 1.22 (ref 0.00–1.49)
Prothrombin Time: 15.5 seconds — ABNORMAL HIGH (ref 11.6–15.2)

## 2014-12-25 LAB — APTT: APTT: 47 s — AB (ref 24–37)

## 2014-12-25 NOTE — Patient Instructions (Signed)
[place]  12/25/2014   Your procedure is scheduled on:   12-31-2014 Tuesday  Enter through [facility]  Entrance and follow signs to [facility]. Arrive at 0700       AM.  (Limit 1 person with you).  Call this number if you have problems the morning of surgery: [phone]  Or Presurgical Testing [phone].   For Living Will and/or Health Care Power Attorney Forms: please provide copy for your medical record,may bring AM of surgery(Forms should be already notarized -we do not provide this service).(12-25-14 Yes., information provided 1'15 visit).      Do not eat food/ or drink: After Midnight.     Take these medicines the morning of surgery with A SIP OF WATER-   (DO NOT TAKE ANY DIABETIC MEDS AM OF SURGERY) : NONE.   Do not wear jewelry, make-up or nail polish.  Do not wear deodorant, lotions, powders, or perfumes.   Do not shave legs and under arms- 48 hours(2 days) prior to first CHG shower.(Shaving face and neck okay.)  Do not bring valuables to the hospital.(Hospital is not responsible for lost valuables).  Contacts, dentures or removable bridgework, body piercing, hair pins may not be worn into surgery.  Leave suitcase in the car. After surgery it may be brought to your room.  For patients admitted to the hospital, checkout time is 11:00 AM the day of discharge.(Restricted visitors-Any Persons displaying flu-like symptoms or illness).    Patients discharged the day of surgery will not be allowed to drive home. Must have responsible person with you x 24 hours once discharged.  Name and phone number of your driver: Roxy Manns- [phone] cell     Please read over the following fact sheets that you were given:  CHG(Chlorhexidine Gluconate 4% Surgical Soap) use, MRSA Information, Blood Transfusion fact sheet, Incentive Spirometry Instruction.  Remember : Type/Screen "Blue armbands" - may not be removed once applied(would result in being retested  AM of surgery, if removed).         [facility] - Preparing for Surgery Before surgery, you can play an important role.  Because skin is not sterile, your skin needs to be as free of germs as possible.  You can reduce the number of germs on your skin by washing with CHG (chlorahexidine gluconate) soap before surgery.  CHG is an antiseptic cleaner which kills germs and bonds with the skin to continue killing germs even after washing. Please DO NOT use if you have an allergy to CHG or antibacterial soaps.  If your skin becomes reddened/irritated stop using the CHG and inform your nurse when you arrive at Short Stay. Do not shave (including legs and underarms) for at least 48 hours prior to the first CHG shower.  You may shave your face/neck. Please follow these instructions carefully:  1.  Shower with CHG Soap the night before surgery and the  morning of Surgery.  2.  If you choose to wash your hair, wash your hair first as usual with your  normal  shampoo.  3.  After you shampoo, rinse your hair and body thoroughly to remove the  shampoo.                           4.  Use CHG as you would any other liquid soap.  You can apply chg directly  to the skin and wash  Gently with a scrungie or clean washcloth.  5.  Apply the CHG Soap to your body ONLY FROM THE NECK DOWN.   Do not use on face/ open                           Wound or open sores. Avoid contact with eyes, ears mouth and genitals (private parts).                       Wash face,  Genitals (private parts) with your normal soap.             6.  Wash thoroughly, paying special attention to the area where your surgery  will be performed.  7.  Thoroughly rinse your body with warm water from the neck down.  8.  DO NOT shower/wash with your normal soap after using and rinsing off  the CHG Soap.                9.  Pat yourself dry with a clean towel.            10.  Wear clean pajamas.            11.  Place clean sheets on your  bed the night of your first shower and do not  sleep with pets. Day of Surgery : Do not apply any lotions/deodorants the morning of surgery.  Please wear clean clothes to the hospital/surgery center.  FAILURE TO FOLLOW THESE INSTRUCTIONS MAY RESULT IN THE CANCELLATION OF YOUR SURGERY PATIENT SIGNATURE_________________________________  NURSE SIGNATURE__________________________________  ________________________________________________________________________   Adam Phenix  An incentive spirometer is a tool that can help keep your lungs clear and active. This tool measures how well you are filling your lungs with each breath. Taking long deep breaths may help reverse or decrease the chance of developing breathing (pulmonary) problems (especially infection) following:  A long period of time when you are unable to move or be active. BEFORE THE PROCEDURE   If the spirometer includes an indicator to show your best effort, your nurse or respiratory therapist will set it to a desired goal.  If possible, sit up straight or lean slightly forward. Try not to slouch.  Hold the incentive spirometer in an upright position. INSTRUCTIONS FOR USE   Sit on the edge of your bed if possible, or sit up as far as you can in bed or on a chair.  Hold the incentive spirometer in an upright position.  Breathe out normally.  Place the mouthpiece in your mouth and seal your lips tightly around it.  Breathe in slowly and as deeply as possible, raising the piston or the ball toward the top of the column.  Hold your breath for 3-5 seconds or for as long as possible. Allow the piston or ball to fall to the bottom of the column.  Remove the mouthpiece from your mouth and breathe out normally.  Rest for a few seconds and repeat Steps 1 through 7 at least 10 times every 1-2 hours when you are awake. Take your time and take a few normal breaths between deep breaths.  The spirometer may include an  indicator to show your best effort. Use the indicator as a goal to work toward during each repetition.  After each set of 10 deep breaths, practice coughing to be sure your lungs are clear. If you have an incision (the cut made at the time of  surgery), support your incision when coughing by placing a pillow or rolled up towels firmly against it. Once you are able to get out of bed, walk around indoors and cough well. You may stop using the incentive spirometer when instructed by your caregiver.  RISKS AND COMPLICATIONS  Take your time so you do not get dizzy or light-headed.  If you are in pain, you may need to take or ask for pain medication before doing incentive spirometry. It is harder to take a deep breath if you are having pain. AFTER USE  Rest and breathe slowly and easily.  It can be helpful to keep track of a log of your progress. Your caregiver can provide you with a simple table to help with this. If you are using the spirometer at home, follow these instructions: [place] IF:   You are having difficultly using the spirometer.  You have trouble using the spirometer as often as instructed.  Your pain medication is not giving enough relief while using the spirometer.  You develop fever of 100.5 F (38.1 C) or higher. SEEK IMMEDIATE MEDICAL CARE IF:   You cough up bloody sputum that had not been present before.  You develop fever of 102 F (38.9 C) or greater.  You develop worsening pain at or near the incision site. MAKE SURE YOU:   Understand these instructions.  Will watch your condition.  Will get help right away if you are not doing well or get worse. Document Released: 07/12/2006 Document Revised: 05/24/2011 Document Reviewed: 09/12/2006 Exit[employer] Patient Information 2014 [employer], [place].   ________________________________________________________________________  WHAT IS A BLOOD TRANSFUSION? Blood Transfusion Information  A transfusion is the  replacement of blood or some of its parts. Blood is made up of multiple cells which provide different functions.  Red blood cells carry oxygen and are used for blood loss replacement.  White blood cells fight against infection.  Platelets control bleeding.  Plasma helps clot blood.  Other blood products are available for specialized needs, such as hemophilia or other clotting disorders. BEFORE THE TRANSFUSION  Who gives blood for transfusions?   Healthy volunteers who are fully evaluated to make sure their blood is safe. This is blood bank blood. Transfusion therapy is the safest it has ever been in the practice of medicine. Before blood is taken from a donor, a complete history is taken to make sure that person has no history of diseases nor engages in risky social behavior (examples are intravenous drug use or sexual activity with multiple partners). The donor's travel history is screened to minimize risk of transmitting infections, such as malaria. The donated blood is tested for signs of infectious diseases, such as HIV and hepatitis. The blood is then tested to be sure it is compatible with you in order to minimize the chance of a transfusion reaction. If you or a relative donates blood, this is often done in anticipation of surgery and is not appropriate for emergency situations. It takes many days to process the donated blood. RISKS AND COMPLICATIONS Although transfusion therapy is very safe and saves many lives, the main dangers of transfusion include:   Getting an infectious disease.  Developing a transfusion reaction. This is an allergic reaction to something in the blood you were given. Every precaution is taken to prevent this. The decision to have a blood transfusion has been considered carefully by your caregiver before blood is given. Blood is not given unless the benefits outweigh the risks. AFTER THE TRANSFUSION  Right after receiving a blood transfusion, you will usually  feel much better and more energetic. This is especially true if your red blood cells have gotten low (anemic). The transfusion raises the level of the red blood cells which carry oxygen, and this usually causes an energy increase.  The nurse administering the transfusion will monitor you carefully for complications. HOME CARE INSTRUCTIONS  No special instructions are needed after a transfusion. You may find your energy is better. Speak with your caregiver about any limitations on activity for underlying diseases you may have. SEEK MEDICAL CARE IF:   Your condition is not improving after your transfusion.  You develop redness or irritation at the intravenous (IV) site. SEEK IMMEDIATE MEDICAL CARE IF:  Any of the following symptoms occur over the next 12 hours:  Shaking chills.  You have a temperature by mouth above 102 F (38.9 C), not controlled by medicine.  Chest, back, or muscle pain.  People around you feel you are not acting correctly or are confused.  Shortness of breath or difficulty breathing.  Dizziness and fainting.  You get a rash or develop hives.  You have a decrease in urine output.  Your urine turns a dark color or changes to pink, red, or brown. Any of the following symptoms occur over the next 10 days:  You have a temperature by mouth above 102 F (38.9 C), not controlled by medicine.  Shortness of breath.  Weakness after normal activity.  The white part of the eye turns yellow (jaundice).  You have a decrease in the amount of urine or are urinating less often.  Your urine turns a dark color or changes to pink, red, or brown. Document Released: 02/27/2000 Document Revised: 05/24/2011 Document Reviewed: 10/16/2007 [facility] Patient Information 2014 [place], [place].  _______________________________________________________________________

## 2014-12-25 NOTE — Pre-Procedure Instructions (Signed)
12-25-14 -EKG (12-17-14) with chart. Dr. Pharr(12-17-14 clearance note with chart. Labs 12-17-14 CBC/d,CMP,Urinalysis, LP, Vitamin D reports with chart.

## 2014-12-27 NOTE — H&P (Signed)
TOTAL HIP ADMISSION H&P  Patient is admitted for left total hip arthroplasty, anterior approach.  Subjective:  Chief Complaint:    Left hip primary OA / pain  HPI: Felicia Hunter, 74 y.o. female, has a history of pain and functional disability in the left hip(s) due to arthritis and patient has failed non-surgical conservative treatments for greater than 12 weeks to include NSAID's and/or analgesics, corticosteriod injections and activity modification.  Onset of symptoms was gradual starting months ago with rapidlly worsening course since that time.The patient noted prior procedures of the hip to include arthroplasty on the right hip(s).  Patient currently rates pain in the left hip at 8 out of 10 with activity. Patient has night pain, worsening of pain with activity and weight bearing, trendelenberg gait, pain that interfers with activities of daily living and pain with passive range of motion. Patient has evidence of periarticular osteophytes and joint space narrowing by imaging studies. This condition presents safety issues increasing the risk of falls. There is no current active infection.   Risks, benefits and expectations were discussed with the patient.  Risks including but not limited to the risk of anesthesia, blood clots, nerve damage, blood vessel damage, failure of the prosthesis, infection and up to and including death.  Patient understand the risks, benefits and expectations and wishes to proceed with surgery.   PCP: Horatio Pel, MD  D/C Plans:      Home with HHPT  Post-op Meds:       No Rx given   Tranexamic Acid:      To be given - topically  (hx of CVAs)  Decadron:      Is to be given  FYI:     Pradaxa / ASA  Dilaudid PO  Ambien to help with sleep in the hospital    Patient Active Problem List   Diagnosis Date Noted  . Cerebral infarction due to embolism of precerebral artery ([place]) 12/20/2013  . History of stroke 12/20/2013  . Expressive aphasia 11/05/2013  .  Expected blood loss anemia 04/11/2013  . Hyponatremia 04/11/2013  . S/P right THA, AA 04/10/2013  . Other and unspecified hyperlipidemia 06/15/2012  . Lacunar infarction ([place]) 06/15/2012  . CVA (cerebral infarction)-mid/inf cerebellar infarct 3/25 06/08/2011  . Near syncope 06/07/2011  . 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 DEPRESSION 10/28/2006  . ALLERGIC RHINITIS 10/28/2006  . ASTHMA 10/28/2006   Past Medical History  Diagnosis Date  . GERD (gastroesophageal reflux disease)   . Arthritis   . Cough     PERSISTANT COUGH X 20 YRS  . Tubulovillous adenoma of colon 08/2003  . Diverticulosis   . Hyperlipidemia   . Osteopenia   . Alopecia   . MVP (mitral valve prolapse)   . Clavicle fracture     right clavicle-hardware retained  . Disorders of bilirubin excretion   . Difficulty sleeping     takes Ambien  . Breast cancer ([place])   . Stroke ([place]) 06/07/11    x2 episodes-"funny feeling"" equilibrium off x24 hours, x1 speech affected- no residuallast 1 yr ago.  . Environmental and seasonal allergies     "dry cough", related to ? allergies    Past Surgical History  Procedure Laterality Date  . Collarbone  september 2011    pinned and plated, right  . Abdominal hysterectomy  1993  . Vein surgery  1974    left leg  . Nasal septum surgery    . Tonsillectomy    .  Joint replacement  july 9th, 2012    left knee replacement   . Knee reconstruction  7/12    revision lt total knee  . Breast enhancement surgery      x3-then removed 2011 for bilat mastectomies  . Chest wall tumor excision  5/12    noduals removed-neg  . Breast capsulotomy      after bilat mastectomies-implants removed  . Breast surgery  june 2011    double mastectomy nodes from rt  . Mass excision Left 06/19/2012    Procedure: EXCISION OF LARGE MASS LEFT NECK/SHOULDER WITH PLASTIC CLOSURE;  Surgeon: Cristine Polio, MD;  Location: [place];  Service: Plastics;  Laterality:  Left;  . Total hip arthroplasty Right 04/10/2013    Procedure: RIGHT TOTAL HIP ARTHROPLASTY ANTERIOR APPROACH;  Surgeon: [name], MD;  Location: [facility];  Service: Orthopedics;  Laterality: Right;  . Tee without cardioversion N/A 11/07/2013    Procedure: TRANSESOPHAGEAL ECHOCARDIOGRAM (TEE);  Surgeon: Dorothy Spark, MD;  Location: [place];  Service: Cardiovascular;  Laterality: N/A;  . Loop recorder implant N/A 11/07/2013    Procedure: LOOP RECORDER IMPLANT;  Surgeon: Evans Lance, MD;  Location: [facility] CATH LAB;  Service: Cardiovascular;  Laterality: N/A;    No prescriptions prior to admission   Allergies  Allergen Reactions  . Sulfonamide Derivatives Rash    Social History  Substance Use Topics  . Smoking status: Never Smoker   . Smokeless tobacco: Never Used  . Alcohol Use: 0.6 oz/week    1 Glasses of wine per week     Comment: 1 glass of wine per night    Family History  Problem Relation Age of Onset  . Aneurysm Father   . Colon cancer Neg Hx      Review of Systems  Constitutional: Negative.   HENT: Negative.   Eyes: Negative.   Respiratory: Negative.   Cardiovascular: Negative.   Gastrointestinal: Negative.   Genitourinary: Negative.   Musculoskeletal: Positive for joint pain.  Skin: Negative.   Neurological: Negative.   Endo/Heme/Allergies: Positive for environmental allergies.  Psychiatric/Behavioral: Positive for depression.    Objective:  Physical Exam  Constitutional: She is oriented to person, place, and time. She appears well-developed and well-nourished.  HENT:  Head: Normocephalic.  Eyes: Pupils are equal, round, and reactive to light.  Neck: Neck supple. No JVD present. No tracheal deviation present. No thyromegaly present.  Cardiovascular: Normal rate, regular rhythm, normal heart sounds and intact distal pulses.   Respiratory: Effort normal and breath sounds normal. No stridor. No respiratory distress. She has no wheezes.  GI: Soft.  There is no tenderness. There is no guarding.  Musculoskeletal:       Left hip: She exhibits decreased range of motion, decreased strength, tenderness and bony tenderness. She exhibits no swelling, no deformity and no laceration.  Lymphadenopathy:    She has no cervical adenopathy.  Neurological: She is alert and oriented to person, place, and time.  Skin: Skin is warm and dry.  Psychiatric: She has a normal mood and affect.    Vital signs in last 24 hours:    Labs:   Estimated body mass index is 24.00 kg/(m^2) as calculated from the following:   Height as of 11/06/14: 5\' 5"  (1.651 m).   Weight as of 11/06/14: 65.409 kg (144 lb 3.2 oz).   Imaging Review Plain radiographs demonstrate severe degenerative joint disease of the left hip(s). The bone quality appears to be good for age  and reported activity level.  Assessment/Plan:  End stage arthritis, left hip(s)  The patient history, physical examination, clinical judgement of the provider and imaging studies are consistent with end stage degenerative joint disease of the left hip(s) and total hip arthroplasty is deemed medically necessary. The treatment options including medical management, injection therapy, arthroscopy and arthroplasty were discussed at length. The risks and benefits of total hip arthroplasty were presented and reviewed. The risks due to aseptic loosening, infection, stiffness, dislocation/subluxation,  thromboembolic complications and other imponderables were discussed.  The patient acknowledged the explanation, agreed to proceed with the plan and consent was signed. Patient is being admitted for inpatient treatment for surgery, pain control, PT, OT, prophylactic antibiotics, VTE prophylaxis, progressive ambulation and ADL's and discharge planning.The patient is planning to be discharged home with home health services.      [place] Rayen Dafoe   PA-C  12/27/2014, 9:06 PM

## 2014-12-31 ENCOUNTER — Encounter (HOSPITAL_COMMUNITY)
Admission: RE | Disposition: A | Discharge status: Home Health | Payer: Self-pay | Source: Ambulatory Visit | Attending: Orthopedic Surgery

## 2014-12-31 ENCOUNTER — Inpatient Hospital Stay (HOSPITAL_COMMUNITY)
Admission: RE | Admit: 2014-12-31 | Discharge: 2015-01-01 | DRG: 470 | Disposition: A | Discharge status: Home Health | Payer: Medicare Other | Source: Ambulatory Visit | Attending: Orthopedic Surgery | Admitting: Orthopedic Surgery

## 2014-12-31 ENCOUNTER — Inpatient Hospital Stay (HOSPITAL_COMMUNITY): Payer: Medicare Other | Admitting: Certified Registered"

## 2014-12-31 ENCOUNTER — Inpatient Hospital Stay (HOSPITAL_COMMUNITY): Payer: Medicare Other

## 2014-12-31 ENCOUNTER — Encounter (HOSPITAL_COMMUNITY): Payer: Self-pay

## 2014-12-31 DIAGNOSIS — Z96652 Presence of left artificial knee joint: Secondary | ICD-10-CM | POA: Diagnosis present

## 2014-12-31 DIAGNOSIS — F329 Major depressive disorder, single episode, unspecified: Secondary | ICD-10-CM | POA: Diagnosis present

## 2014-12-31 DIAGNOSIS — Z96641 Presence of right artificial hip joint: Secondary | ICD-10-CM | POA: Diagnosis present

## 2014-12-31 DIAGNOSIS — M1612 Unilateral primary osteoarthritis, left hip: Secondary | ICD-10-CM | POA: Diagnosis not present

## 2014-12-31 DIAGNOSIS — M25552 Pain in left hip: Secondary | ICD-10-CM | POA: Diagnosis not present

## 2014-12-31 DIAGNOSIS — J45909 Unspecified asthma, uncomplicated: Secondary | ICD-10-CM | POA: Diagnosis present

## 2014-12-31 DIAGNOSIS — Z96642 Presence of left artificial hip joint: Secondary | ICD-10-CM | POA: Diagnosis not present

## 2014-12-31 DIAGNOSIS — Z8673 Personal history of transient ischemic attack (TIA), and cerebral infarction without residual deficits: Secondary | ICD-10-CM | POA: Diagnosis not present

## 2014-12-31 DIAGNOSIS — Z853 Personal history of malignant neoplasm of breast: Secondary | ICD-10-CM

## 2014-12-31 DIAGNOSIS — Z9011 Acquired absence of right breast and nipple: Secondary | ICD-10-CM

## 2014-12-31 DIAGNOSIS — I341 Nonrheumatic mitral (valve) prolapse: Secondary | ICD-10-CM | POA: Diagnosis present

## 2014-12-31 DIAGNOSIS — M169 Osteoarthritis of hip, unspecified: Secondary | ICD-10-CM | POA: Diagnosis not present

## 2014-12-31 DIAGNOSIS — Z90710 Acquired absence of both cervix and uterus: Secondary | ICD-10-CM | POA: Diagnosis not present

## 2014-12-31 DIAGNOSIS — K219 Gastro-esophageal reflux disease without esophagitis: Secondary | ICD-10-CM | POA: Diagnosis present

## 2014-12-31 DIAGNOSIS — I639 Cerebral infarction, unspecified: Secondary | ICD-10-CM | POA: Diagnosis not present

## 2014-12-31 DIAGNOSIS — E785 Hyperlipidemia, unspecified: Secondary | ICD-10-CM | POA: Diagnosis present

## 2014-12-31 DIAGNOSIS — Z96649 Presence of unspecified artificial hip joint: Secondary | ICD-10-CM

## 2014-12-31 DIAGNOSIS — Z471 Aftercare following joint replacement surgery: Secondary | ICD-10-CM | POA: Diagnosis not present

## 2014-12-31 HISTORY — PX: TOTAL HIP ARTHROPLASTY: SHX124

## 2014-12-31 LAB — TYPE AND SCREEN
ABO/RH(D): A POS
Antibody Screen: NEGATIVE

## 2014-12-31 SURGERY — ARTHROPLASTY, HIP, TOTAL, ANTERIOR APPROACH
Anesthesia: General | Site: Hip | Laterality: Left

## 2014-12-31 MED ORDER — TRANEXAMIC ACID 1000 MG/10ML IV SOLN
2000.0000 mg | Freq: Once | INTRAVENOUS | Status: DC
  Filled 2014-12-31: qty 20

## 2014-12-31 MED ORDER — METOCLOPRAMIDE HCL 5 MG/ML IJ SOLN: 5.0000 mg | Freq: Three times a day (TID) | INTRAMUSCULAR | Status: DC | PRN

## 2014-12-31 MED ORDER — DEXAMETHASONE SODIUM PHOSPHATE 10 MG/ML IJ SOLN
INTRAMUSCULAR | Status: AC
  Filled 2014-12-31: qty 1

## 2014-12-31 MED ORDER — DEXAMETHASONE SODIUM PHOSPHATE 10 MG/ML IJ SOLN
10.0000 mg | Freq: Once | INTRAMUSCULAR | Status: AC
  Administered 2015-01-01: 10 mg via INTRAVENOUS
  Filled 2014-12-31: qty 1

## 2014-12-31 MED ORDER — FERROUS SULFATE 325 (65 FE) MG PO TABS
325.0000 mg | ORAL_TABLET | Freq: Three times a day (TID) | ORAL | Status: DC
  Administered 2015-01-01: 325 mg via ORAL
  Filled 2014-12-31 (×6): qty 1

## 2014-12-31 MED ORDER — ONDANSETRON HCL 4 MG/2ML IJ SOLN: 4.0000 mg | Freq: Four times a day (QID) | INTRAMUSCULAR | Status: DC | PRN

## 2014-12-31 MED ORDER — CEFAZOLIN SODIUM-DEXTROSE 2-3 GM-% IV SOLR
2.0000 g | Freq: Four times a day (QID) | INTRAVENOUS | Status: AC
  Administered 2014-12-31 (×2): 2 g via INTRAVENOUS
  Filled 2014-12-31 (×2): qty 50

## 2014-12-31 MED ORDER — MIDAZOLAM HCL 5 MG/5ML IJ SOLN
INTRAMUSCULAR | Status: DC | PRN
  Administered 2014-12-31 (×2): 1 mg via INTRAVENOUS

## 2014-12-31 MED ORDER — HYDROMORPHONE HCL 2 MG/ML IJ SOLN
INTRAMUSCULAR | Status: AC
  Filled 2014-12-31: qty 1

## 2014-12-31 MED ORDER — GLYCOPYRROLATE 0.2 MG/ML IJ SOLN
INTRAMUSCULAR | Status: DC | PRN
  Administered 2014-12-31: 0.6 mg via INTRAVENOUS

## 2014-12-31 MED ORDER — ONDANSETRON HCL 4 MG/2ML IJ SOLN
INTRAMUSCULAR | Status: DC | PRN
  Administered 2014-12-31: 4 mg via INTRAVENOUS

## 2014-12-31 MED ORDER — ROCURONIUM BROMIDE 100 MG/10ML IV SOLN
INTRAVENOUS | Status: AC
  Filled 2014-12-31: qty 1

## 2014-12-31 MED ORDER — DOCUSATE SODIUM 100 MG PO CAPS
100.0000 mg | ORAL_CAPSULE | Freq: Two times a day (BID) | ORAL | Status: DC
  Administered 2014-12-31 – 2015-01-01 (×2): 100 mg via ORAL

## 2014-12-31 MED ORDER — MEPERIDINE HCL 50 MG/ML IJ SOLN: 6.2500 mg | INTRAMUSCULAR | Status: DC | PRN

## 2014-12-31 MED ORDER — FENTANYL CITRATE (PF) 100 MCG/2ML IJ SOLN
INTRAMUSCULAR | Status: DC | PRN
  Administered 2014-12-31 (×2): 50 ug via INTRAVENOUS

## 2014-12-31 MED ORDER — NEOSTIGMINE METHYLSULFATE 10 MG/10ML IV SOLN
INTRAVENOUS | Status: DC | PRN
  Administered 2014-12-31: 4 mg via INTRAVENOUS

## 2014-12-31 MED ORDER — PROPOFOL 10 MG/ML IV BOLUS
INTRAVENOUS | Status: DC | PRN
  Administered 2014-12-31: 160 mg via INTRAVENOUS

## 2014-12-31 MED ORDER — FENTANYL CITRATE (PF) 100 MCG/2ML IJ SOLN
INTRAMUSCULAR | Status: AC
  Filled 2014-12-31: qty 2

## 2014-12-31 MED ORDER — LIDOCAINE HCL (CARDIAC) 20 MG/ML IV SOLN
INTRAVENOUS | Status: AC
  Filled 2014-12-31: qty 5

## 2014-12-31 MED ORDER — SUCCINYLCHOLINE CHLORIDE 20 MG/ML IJ SOLN
INTRAMUSCULAR | Status: DC | PRN
  Administered 2014-12-31: 100 mg via INTRAVENOUS

## 2014-12-31 MED ORDER — ONDANSETRON HCL 4 MG PO TABS: 4.0000 mg | ORAL_TABLET | Freq: Four times a day (QID) | ORAL | Status: DC | PRN

## 2014-12-31 MED ORDER — FENTANYL CITRATE (PF) 100 MCG/2ML IJ SOLN
25.0000 ug | INTRAMUSCULAR | Status: DC | PRN
Start: 2014-12-31 — End: 2014-12-31
  Administered 2014-12-31 (×3): 50 ug via INTRAVENOUS

## 2014-12-31 MED ORDER — CEFAZOLIN SODIUM-DEXTROSE 2-3 GM-% IV SOLR
2.0000 g | INTRAVENOUS | Status: AC
  Administered 2014-12-31: 2 g via INTRAVENOUS

## 2014-12-31 MED ORDER — FENTANYL CITRATE (PF) 100 MCG/2ML IJ SOLN
INTRAMUSCULAR | Status: AC
Start: 2014-12-31 — End: 2015-01-01
  Filled 2014-12-31: qty 2

## 2014-12-31 MED ORDER — DIPHENHYDRAMINE HCL 25 MG PO CAPS: 25.0000 mg | ORAL_CAPSULE | Freq: Four times a day (QID) | ORAL | Status: DC | PRN

## 2014-12-31 MED ORDER — ONDANSETRON HCL 4 MG/2ML IJ SOLN
INTRAMUSCULAR | Status: AC
  Filled 2014-12-31: qty 2

## 2014-12-31 MED ORDER — MAGNESIUM CITRATE PO SOLN: 1.0000 | Freq: Once | ORAL | Status: DC | PRN

## 2014-12-31 MED ORDER — LACTATED RINGERS IV SOLN
INTRAVENOUS | Status: DC
  Administered 2014-12-31: 1000 mL via INTRAVENOUS
  Administered 2014-12-31 (×2): via INTRAVENOUS

## 2014-12-31 MED ORDER — ATORVASTATIN CALCIUM 20 MG PO TABS
20.0000 mg | ORAL_TABLET | Freq: Every evening | ORAL | Status: DC
  Administered 2014-12-31: 20 mg via ORAL
  Filled 2014-12-31 (×2): qty 1

## 2014-12-31 MED ORDER — MIDAZOLAM HCL 2 MG/2ML IJ SOLN
INTRAMUSCULAR | Status: AC
  Filled 2014-12-31: qty 4

## 2014-12-31 MED ORDER — SODIUM CHLORIDE 0.9 % IV SOLN
100.0000 mL/h | INTRAVENOUS | Status: DC
  Administered 2014-12-31: 100 mL/h via INTRAVENOUS
  Filled 2014-12-31 (×5): qty 1000

## 2014-12-31 MED ORDER — HYDROCODONE-ACETAMINOPHEN 7.5-325 MG PO TABS
1.0000 | ORAL_TABLET | ORAL | Status: DC
  Administered 2014-12-31 – 2015-01-01 (×5): 1 via ORAL
  Filled 2014-12-31 (×6): qty 1

## 2014-12-31 MED ORDER — LIDOCAINE HCL (CARDIAC) 20 MG/ML IV SOLN
INTRAVENOUS | Status: DC | PRN
  Administered 2014-12-31: 50 mg via INTRAVENOUS

## 2014-12-31 MED ORDER — METHOCARBAMOL 500 MG PO TABS
500.0000 mg | ORAL_TABLET | Freq: Four times a day (QID) | ORAL | Status: DC | PRN
  Administered 2014-12-31 – 2015-01-01 (×4): 500 mg via ORAL
  Filled 2014-12-31 (×4): qty 1

## 2014-12-31 MED ORDER — MENTHOL 3 MG MT LOZG: 1.0000 | LOZENGE | OROMUCOSAL | Status: DC | PRN

## 2014-12-31 MED ORDER — CHLORHEXIDINE GLUCONATE 4 % EX LIQD: 60.0000 mL | Freq: Once | CUTANEOUS | Status: DC

## 2014-12-31 MED ORDER — HYDROMORPHONE HCL 1 MG/ML IJ SOLN: 0.5000 mg | INTRAMUSCULAR | Status: DC | PRN

## 2014-12-31 MED ORDER — HYDROMORPHONE HCL 1 MG/ML IJ SOLN
INTRAMUSCULAR | Status: DC | PRN
  Administered 2014-12-31 (×2): 1 mg via INTRAVENOUS

## 2014-12-31 MED ORDER — SODIUM CHLORIDE 0.9 % IR SOLN
Status: DC | PRN
  Administered 2014-12-31: 1000 mL

## 2014-12-31 MED ORDER — ALUM & MAG HYDROXIDE-SIMETH 200-200-20 MG/5ML PO SUSP: 30.0000 mL | ORAL | Status: DC | PRN

## 2014-12-31 MED ORDER — PROPOFOL 10 MG/ML IV BOLUS
INTRAVENOUS | Status: AC
  Filled 2014-12-31: qty 20

## 2014-12-31 MED ORDER — BISACODYL 10 MG RE SUPP: 10.0000 mg | Freq: Every day | RECTAL | Status: DC | PRN

## 2014-12-31 MED ORDER — POLYETHYLENE GLYCOL 3350 17 G PO PACK
17.0000 g | PACK | Freq: Two times a day (BID) | ORAL | Status: DC
  Administered 2014-12-31 – 2015-01-01 (×2): 17 g via ORAL

## 2014-12-31 MED ORDER — PROMETHAZINE HCL 25 MG/ML IJ SOLN: 6.2500 mg | INTRAMUSCULAR | Status: DC | PRN

## 2014-12-31 MED ORDER — FENTANYL CITRATE (PF) 100 MCG/2ML IJ SOLN
INTRAMUSCULAR | Status: AC
  Filled 2014-12-31: qty 4

## 2014-12-31 MED ORDER — ROCURONIUM BROMIDE 100 MG/10ML IV SOLN
INTRAVENOUS | Status: DC | PRN
  Administered 2014-12-31: 25 mg via INTRAVENOUS
  Administered 2014-12-31: 5 mg via INTRAVENOUS
  Administered 2014-12-31: 10 mg via INTRAVENOUS

## 2014-12-31 MED ORDER — METOCLOPRAMIDE HCL 10 MG PO TABS: 5.0000 mg | ORAL_TABLET | Freq: Three times a day (TID) | ORAL | Status: DC | PRN

## 2014-12-31 MED ORDER — DEXAMETHASONE SODIUM PHOSPHATE 10 MG/ML IJ SOLN
10.0000 mg | Freq: Once | INTRAMUSCULAR | Status: AC
  Administered 2014-12-31: 10 mg via INTRAVENOUS

## 2014-12-31 MED ORDER — METHOCARBAMOL 1000 MG/10ML IJ SOLN
500.0000 mg | Freq: Four times a day (QID) | INTRAVENOUS | Status: DC | PRN
  Administered 2014-12-31: 500 mg via INTRAVENOUS
  Filled 2014-12-31 (×2): qty 5

## 2014-12-31 MED ORDER — PHENOL 1.4 % MT LIQD: 1.0000 | OROMUCOSAL | Status: DC | PRN

## 2014-12-31 SURGICAL SUPPLY — 45 items
BAG DECANTER FOR FLEXI CONT (MISCELLANEOUS) IMPLANT
BAG SPEC THK2 15X12 ZIP CLS (MISCELLANEOUS)
BAG ZIPLOCK 12X15 (MISCELLANEOUS) IMPLANT
CAPT HIP TOTAL 2 ×2 IMPLANT
COVER PERINEAL POST (MISCELLANEOUS) ×3 IMPLANT
DRAPE C-ARM 42X120 X-RAY (DRAPES) ×3 IMPLANT
DRAPE STERI IOBAN 125X83 (DRAPES) ×3 IMPLANT
DRAPE U-SHAPE 47X51 STRL (DRAPES) ×9 IMPLANT
DRSG AQUACEL AG ADV 3.5X10 (GAUZE/BANDAGES/DRESSINGS) ×3 IMPLANT
DURAPREP 26ML APPLICATOR (WOUND CARE) ×3 IMPLANT
ELECT BLADE TIP CTD 4 INCH (ELECTRODE) ×3 IMPLANT
ELECT PENCIL ROCKER SW 15FT (MISCELLANEOUS) IMPLANT
ELECT REM PT RETURN 15FT ADLT (MISCELLANEOUS) IMPLANT
ELECT REM PT RETURN 9FT ADLT (ELECTROSURGICAL) ×3
ELECTRODE REM PT RTRN 9FT ADLT (ELECTROSURGICAL) ×1 IMPLANT
FACESHIELD WRAPAROUND (MASK) ×12 IMPLANT
FACESHIELD WRAPAROUND OR TEAM (MASK) ×4 IMPLANT
GLOVE BIOGEL PI IND STRL 7.5 (GLOVE) ×1 IMPLANT
GLOVE BIOGEL PI IND STRL 8.5 (GLOVE) ×1 IMPLANT
GLOVE BIOGEL PI INDICATOR 7.5 (GLOVE) ×2
GLOVE BIOGEL PI INDICATOR 8.5 (GLOVE) ×2
GLOVE ECLIPSE 8.0 STRL XLNG CF (GLOVE) ×6 IMPLANT
GLOVE ORTHO TXT STRL SZ7.5 (GLOVE) ×3 IMPLANT
GOWN SPEC L3 XXLG W/TWL (GOWN DISPOSABLE) ×3 IMPLANT
GOWN STRL REUS W/TWL LRG LVL3 (GOWN DISPOSABLE) ×3 IMPLANT
HOLDER FOLEY CATH W/STRAP (MISCELLANEOUS) ×3 IMPLANT
KIT BASIN OR (CUSTOM PROCEDURE TRAY) ×3 IMPLANT
LIQUID BAND (GAUZE/BANDAGES/DRESSINGS) ×3 IMPLANT
NDL SAFETY ECLIPSE 18X1.5 (NEEDLE) IMPLANT
NEEDLE HYPO 18GX1.5 SHARP (NEEDLE)
PACK TOTAL JOINT (CUSTOM PROCEDURE TRAY) ×3 IMPLANT
PEN SKIN MARKING BROAD (MISCELLANEOUS) ×3 IMPLANT
SAW OSC TIP CART 19.5X105X1.3 (SAW) ×3 IMPLANT
SUT MNCRL AB 4-0 PS2 18 (SUTURE) ×3 IMPLANT
SUT VIC AB 1 CT1 36 (SUTURE) ×9 IMPLANT
SUT VIC AB 2-0 CT1 27 (SUTURE) ×6
SUT VIC AB 2-0 CT1 TAPERPNT 27 (SUTURE) ×2 IMPLANT
SUT VLOC 180 0 24IN GS25 (SUTURE) ×3 IMPLANT
SYR 50ML LL SCALE MARK (SYRINGE) IMPLANT
TOWEL OR 17X26 10 PK STRL BLUE (TOWEL DISPOSABLE) ×3 IMPLANT
TOWEL OR NON WOVEN STRL DISP B (DISPOSABLE) IMPLANT
TRAY FOLEY W/METER SILVER 14FR (SET/KITS/TRAYS/PACK) ×3 IMPLANT
TRAY FOLEY W/METER SILVER 16FR (SET/KITS/TRAYS/PACK) ×3 IMPLANT
WATER STERILE IRR 1500ML POUR (IV SOLUTION) ×3 IMPLANT
YANKAUER SUCT BULB TIP 10FT TU (MISCELLANEOUS) ×3 IMPLANT

## 2014-12-31 NOTE — Discharge Instructions (Signed)

## 2014-12-31 NOTE — Interval H&P Note (Signed)
History and Physical Interval Note:  12/31/2014 8:39 AM  Felicia Hunter  has presented today for surgery, with the diagnosis of left hip osteoarthritis  The various methods of treatment have been discussed with the patient and family. After consideration of risks, benefits and other options for treatment, the patient has consented to  Procedure(s): LEFT TOTAL HIP ARTHROPLASTY ANTERIOR APPROACH (Left) as a surgical intervention .  The patient's history has been reviewed, patient examined, no change in status, stable for surgery.  I have reviewed the patient's chart and labs.  Questions were answered to the patient's satisfaction.     [name]

## 2014-12-31 NOTE — Progress Notes (Signed)
Utilization review completed.  

## 2014-12-31 NOTE — Op Note (Addendum)
NAME:  Felicia Hunter                ACCOUNT NO.: [id number]      MEDICAL RECORD NO.: [id number]      FACILITY:  [facility]      PHYSICIAN:  Paralee Cancel D  DATE OF BIRTH:  Jun 24, 1940     DATE OF PROCEDURE:  12/31/2014                                 OPERATIVE REPORT         PREOPERATIVE DIAGNOSIS: Left  hip osteoarthritis.      POSTOPERATIVE DIAGNOSIS:  Left hip osteoarthritis. History of right total hip repalcement     PROCEDURE:  Left total hip replacement through an anterior approach   utilizing DePuy THR system, component size 73mm pinnacle cup, a size 32+4 neutral   Altrex liner, a size 4 Hi Tri Lock stem with a 32+5 delta ceramic   ball.      SURGEON:  Pietro Cassis. Alvan Dame, M.D.      ASSISTANT:  Danae Orleans, PA-C      ANESTHESIA:  General.      SPECIMENS:  None.      COMPLICATIONS:  None.      BLOOD LOSS:  600 cc     DRAINS:  None.      INDICATION OF THE PROCEDURE:  CYNITHA BERTE is a 74 y.o. female who had   presented to office for evaluation of left hip pain.  Radiographs revealed   progressive degenerative changes with bone-on-bone   articulation to the  hip joint.  The patient had painful limited range of   motion significantly affecting their overall quality of life.  The patient was failing to    respond to conservative measures, and at this point was ready   to proceed with more definitive measures.  The patient has noted progressive   degenerative changes in his hip, progressive problems and dysfunction   with regarding the hip prior to surgery.  Consent was obtained for   benefit of pain relief.  Specific risk of infection, DVT, component   failure, dislocation, need for revision surgery, as well discussion of   the anterior versus posterior approach were reviewed.  Consent was   obtained for benefit of anterior pain relief through an anterior   approach.      PROCEDURE IN DETAIL:  The patient was brought to operative theater.   Once adequate anesthesia, preoperative antibiotics, 2gm of Ancef, and 10 mg of Decadron administered.   The patient was positioned supine on the OSI Hanna table.  Once adequate   padding of boney process was carried out, we had predraped out the hip, and  used fluoroscopy to confirm orientation of the pelvis and position.      The left hip was then prepped and draped from proximal iliac crest to   mid thigh with shower curtain technique.      Time-out was performed identifying the patient, planned procedure, and   extremity.     An incision was then made 2 cm distal and lateral to the   anterior superior iliac spine extending over the orientation of the   tensor fascia lata muscle and sharp dissection was carried down to the   fascia of the muscle and protractor placed in the soft tissues.      The fascia was  then incised.  The muscle belly was identified and swept   laterally and retractor placed along the superior neck.  Following   cauterization of the circumflex vessels and removing some pericapsular   fat, a second cobra retractor was placed on the inferior neck.  A third   retractor was placed on the anterior acetabulum after elevating the   anterior rectus.  A L-capsulotomy was along the line of the   superior neck to the trochanteric fossa, then extended proximally and   distally.  Tag sutures were placed and the retractors were then placed   intracapsular.  We then identified the trochanteric fossa and   orientation of my neck cut, confirmed this radiographically   and then made a neck osteotomy with the femur on traction.  The femoral   head was removed without difficulty or complication.  Traction was let   off and retractors were placed posterior and anterior around the   acetabulum.      The labrum and foveal tissue were debrided.  I began reaming with a 48mm   reamer and reamed up to 14mm reamer on this hip with good bony bed preparation and a 41mm   cup was chosen.   The final 56mm Pinnacle cup was then impacted under fluoroscopy  to confirm the depth of penetration and orientation with respect to   abduction.  A screw was placed followed by the hole eliminator.  The final   32+4 neutral Altrex liner was impacted with good visualized rim fit.  The cup was positioned anatomically within the acetabular portion of the pelvis.      At this point, the femur was rolled at 80 degrees.  Further capsule was   released off the inferior aspect of the femoral neck.  I then   released the superior capsule proximally.  The hook was placed laterally   along the femur and elevated manually and held in position with the bed   hook.  The leg was then extended and adducted with the leg rolled to 100   degrees of external rotation.  Once the proximal femur was fully   exposed, I used a box osteotome to set orientation.  I then began   broaching with the starting chili pepper broach and passed this by hand and then broached up to 4 (versus a 3 on the other hip).  With the 4  broach in place I chose a high offset neck and did trial reductions.  I felt her leg lengths and offset and jump distance were best matched to the other hip with the +5 head ball trial verus the +1 head ball, confirmed radiographically.   Given these findings, I went ahead and dislocated the hip, repositioned all   retractors and positioned the right hip in the extended and abducted position.  The final 4 Hi Tri Lock stem was   chosen and it was impacted down to the level of neck cut.  Based on this   and the trial reduction, a 32+5 delta ceramic ball was chosen and   impacted onto a clean and dry trunnion, and the hip was reduced.  The   hip had been irrigated throughout the case again at this point.  I did   reapproximate the superior capsular leaflet to the anterior leaflet   using #1 Vicryl.  The fascia of the   tensor fascia lata muscle was then reapproximated using #1 Vicryl and # 0 V-lock sutures.  We  infused  2 gm of Tranexamic Acid as topical application once the fascia was closed.  The   remaining wound was closed with 2-0 Vicryl and running 4-0 Monocryl.   The hip was cleaned, dried, and dressed sterilely using Dermabond and   Aquacel dressing.  She was then brought   to recovery room in stable condition tolerating the procedure well.    Danae Orleans, PA-C was present for the entirety of the case involved from   preoperative positioning, perioperative retractor management, general   facilitation of the case, as well as primary wound closure as assistant.            Pietro Cassis Alvan Dame, M.D.        12/31/2014 10:56 AM

## 2014-12-31 NOTE — Evaluation (Signed)
Physical Therapy Evaluation Patient Details Name: Felicia Hunter MRN: [id number] DOB: 02-11-1941 Today's Date: 12/31/2014   History of Present Illness  s/p L  DA THA; PMH CVA, breast CA  Clinical Impression  [name]  Pt will benefit from skilled PT to increase their independence and safety with mobility to allow discharge to the venue listed below.   Pt will benefit from HHPT at D/C, pt reports her dtrs will be helping for a few days as needed     Follow Up Recommendations Home health PT;Supervision for mobility/OOB    Equipment Recommendations  None recommended by PT    Recommendations for Other Services       Precautions / Restrictions Precautions Precautions: Fall Restrictions Other Position/Activity Restrictions: WBAT LLE      Mobility  Bed Mobility Overal bed mobility: Needs Assistance Bed Mobility: Supine to Sit     Supine to sit: Min assist;Min guard     General bed mobility comments: cues for technique  Transfers Overall transfer level: Needs assistance Equipment used: Rolling walker (2 wheeled) Transfers: Sit to/from Stand Sit to Stand: Min assist            Ambulation/Gait Ambulation/Gait assistance: Min assist Ambulation Distance (Feet): 50 Feet Assistive device: Rolling walker (2 wheeled) Gait Pattern/deviations: Step-to pattern;Antalgic     General Gait Details: cues for RW safety, step length and RW position  Stairs            Wheelchair Mobility    Modified Rankin (Stroke Patients Only)       Balance Overall balance assessment: Needs assistance           Standing balance-Leahy Scale: Poor                               Pertinent Vitals/Pain Pain Assessment: 0-10 Pain Score: 2  Pain Location: L THA Pain Descriptors / Indicators: Aching;Grimacing Pain Intervention(s): Limited  activity within patient's tolerance;Monitored during session;Premedicated before session;Repositioned;Ice applied    Home Living Family/patient expects to be discharged to:: Private residence Living Arrangements: Alone Available Help at Discharge: Family;Available PRN/intermittently Type of Home: House Home Access: Stairs to enter Entrance Stairs-Rails: Right Entrance Stairs-Number of Steps: 2-3 Home Layout: Two level;Able to live on main level with bedroom/bathroom Home Equipment: Gilford Rile - 2 wheels;Cane - single point;Bedside commode;Shower seat Additional Comments: dtrs available up to 1week    Prior Function Level of Independence: Independent               Hand Dominance        Extremity/Trunk Assessment   Upper Extremity Assessment: Defer to OT evaluation           Lower Extremity Assessment: LLE deficits/detail   LLE Deficits / Details: AAROM WFL; ankle WFL     Communication   Communication: No difficulties  Cognition Arousal/Alertness: Awake/alert Behavior During Therapy: WFL for tasks assessed/performed Overall Cognitive Status: Within Functional Limits for tasks assessed                      General Comments      Exercises Total Joint Exercises Ankle Circles/Pumps: AROM;Both;10 reps Quad Sets: 10 reps;AROM;Both;Strengthening      Assessment/Plan    PT Assessment Patient needs continued PT services  PT Diagnosis Difficulty walking   PT Problem List Decreased  strength;Decreased range of motion;Decreased balance;Decreased mobility;Decreased activity tolerance;Decreased knowledge of use of DME  PT Treatment Interventions DME instruction;Gait training;Functional mobility training;Therapeutic activities;Therapeutic exercise;Patient/family education   PT Goals (Current goals can be found in the Care Plan section) Acute Rehab PT Goals Patient Stated Goal: return to I PT Goal Formulation: With patient Time For Goal Achievement:  01/03/15 Potential to Achieve Goals: Good    Frequency 7X/week   Barriers to discharge        Co-evaluation               End of Session Equipment Utilized During Treatment: Gait belt Activity Tolerance: Patient tolerated treatment well Patient left: in chair;with call bell/phone within reach Nurse Communication: Mobility status         Time: 6244-6950 PT Time Calculation (min) (ACUTE ONLY): 16 min   Charges:   PT Evaluation $Initial PT Evaluation Tier I: 1 Procedure     PT G CodesKenyon Hunter 01/29/15, 5:07 PM

## 2014-12-31 NOTE — Anesthesia Postprocedure Evaluation (Signed)
  Anesthesia Post-op Note  Patient: Felicia Hunter  Procedure(s) Performed: Procedure(s) (LRB): LEFT TOTAL HIP ARTHROPLASTY ANTERIOR APPROACH (Left)  Patient Location: PACU  Anesthesia Type: General  Level of Consciousness: awake and alert   Airway and Oxygen Therapy: Patient Spontanous Breathing  Post-op Pain: mild  Post-op Assessment: Post-op Vital signs reviewed, Patient's Cardiovascular Status Stable, Respiratory Function Stable, Patent Airway and No signs of Nausea or vomiting  Last Vitals:  Filed Vitals:   12/31/14 1330  BP: 100/71  Pulse: 60  Temp: 36.7 C  Resp: 16    Post-op Vital Signs: stable   Complications: No apparent anesthesia complications

## 2014-12-31 NOTE — Anesthesia Preprocedure Evaluation (Signed)
Anesthesia Evaluation  Patient identified by MRN, date of birth, ID band Patient awake    Reviewed: Allergy & Precautions, H&P , NPO status , Patient's Chart, lab work & pertinent test results  Airway Mallampati: II  TM Distance: >3 FB Neck ROM: Full    Dental no notable dental hx. (+) Teeth Intact, Dental Advisory Given   Pulmonary asthma ,    Pulmonary exam normal breath sounds clear to auscultation       Cardiovascular + Valvular Problems/Murmurs MVP  Rhythm:Regular Rate:Normal     Neuro/Psych PSYCHIATRIC DISORDERS Depression CVA, No Residual Symptoms    GI/Hepatic Neg liver ROS, GERD  Medicated and Controlled,  Endo/Other  negative endocrine ROS  Renal/GU negative Renal ROS     Musculoskeletal   Abdominal   Peds  Hematology negative hematology ROS (+)   Anesthesia Other Findings   Reproductive/Obstetrics negative OB ROS                             Anesthesia Physical  Anesthesia Plan  ASA: II  Anesthesia Plan: General   Post-op Pain Management:    Induction: Intravenous  Airway Management Planned: Oral ETT  Additional Equipment:   Intra-op Plan:   Post-operative Plan: Extubation in OR  Informed Consent: I have reviewed the patients History and Physical, chart, labs and discussed the procedure including the risks, benefits and alternatives for the proposed anesthesia with the patient or authorized representative who has indicated his/her understanding and acceptance.   Dental advisory given  Plan Discussed with: CRNA  Anesthesia Plan Comments: (Pt is on a drug trial. Either taking ASA or Pradaxa daily. Stopped on Friday, which is not the required 5 days for a spinal, hence GA today.)        Anesthesia Quick Evaluation

## 2014-12-31 NOTE — Anesthesia Procedure Notes (Signed)
Procedure Name: Intubation Date/Time: 12/31/2014 9:46 AM Performed by: Noralyn Pick D Pre-anesthesia Checklist: Patient identified, Emergency Drugs available, Suction available and Patient being monitored Patient Re-evaluated:Patient Re-evaluated prior to inductionOxygen Delivery Method: Circle System Utilized Preoxygenation: Pre-oxygenation with 100% oxygen Intubation Type: IV induction Ventilation: Mask ventilation without difficulty Grade View: Grade III Tube type: Oral Tube size: 7.5 mm Number of attempts: 1 Airway Equipment and Method: Stylet and Oral airway Placement Confirmation: ETT inserted through vocal cords under direct vision,  positive ETCO2 and breath sounds checked- equal and bilateral Secured at: 21 cm Tube secured with: Tape Dental Injury: Teeth and Oropharynx as per pre-operative assessment

## 2014-12-31 NOTE — Transfer of Care (Signed)
Immediate Anesthesia Transfer of Care Note  Patient: Felicia Hunter  Procedure(s) Performed: Procedure(s): LEFT TOTAL HIP ARTHROPLASTY ANTERIOR APPROACH (Left)  Patient Location: PACU  Anesthesia Type:General  Level of Consciousness: awake, alert  and patient cooperative  Airway & Oxygen Therapy: Patient Spontanous Breathing and Patient connected to face mask oxygen  Post-op Assessment: Report given to RN and Post -op Vital signs reviewed and stable  Post vital signs: Reviewed and stable  Last Vitals:  Filed Vitals:   12/31/14 0710  BP: 141/89  Pulse: 70  Temp: 36.6 C  Resp: 18    Complications: No apparent anesthesia complications

## 2015-01-01 ENCOUNTER — Telehealth: Payer: Self-pay

## 2015-01-01 ENCOUNTER — Ambulatory Visit (INDEPENDENT_AMBULATORY_CARE_PROVIDER_SITE_OTHER): Payer: Medicare Other | Admitting: *Deleted

## 2015-01-01 DIAGNOSIS — I639 Cerebral infarction, unspecified: Secondary | ICD-10-CM

## 2015-01-01 LAB — CBC
HCT: 29.5 % — ABNORMAL LOW (ref 36.0–46.0)
HEMOGLOBIN: 9.6 g/dL — AB (ref 12.0–15.0)
MCH: 31.7 pg (ref 26.0–34.0)
MCHC: 32.5 g/dL (ref 30.0–36.0)
MCV: 97.4 fL (ref 78.0–100.0)
PLATELETS: 154 10*3/uL (ref 150–400)
RBC: 3.03 MIL/uL — ABNORMAL LOW (ref 3.87–5.11)
RDW: 13.1 % (ref 11.5–15.5)
WBC: 8.4 10*3/uL (ref 4.0–10.5)

## 2015-01-01 LAB — BASIC METABOLIC PANEL
Anion gap: 6 (ref 5–15)
BUN: 9 mg/dL (ref 6–20)
CHLORIDE: 107 mmol/L (ref 101–111)
CO2: 26 mmol/L (ref 22–32)
CREATININE: 0.66 mg/dL (ref 0.44–1.00)
Calcium: 8.5 mg/dL — ABNORMAL LOW (ref 8.9–10.3)
GFR calc Af Amer: >60 mL/min (ref >60)
GFR calc non Af Amer: >60 mL/min (ref >60)
GLUCOSE: 121 mg/dL — AB (ref 65–99)
POTASSIUM: 4.5 mmol/L (ref 3.5–5.1)
SODIUM: 139 mmol/L (ref 135–145)

## 2015-01-01 MED ORDER — ASPIRIN 325 MG PO TABS: 325.0000 mg | ORAL_TABLET | Freq: Two times a day (BID) | ORAL | Status: AC

## 2015-01-01 MED ORDER — FERROUS SULFATE 325 (65 FE) MG PO TABS: 325.0000 mg | ORAL_TABLET | Freq: Three times a day (TID) | ORAL | Status: DC

## 2015-01-01 MED ORDER — DOCUSATE SODIUM 100 MG PO CAPS: 100.0000 mg | ORAL_CAPSULE | Freq: Two times a day (BID) | ORAL | Status: DC

## 2015-01-01 MED ORDER — HYDROCODONE-ACETAMINOPHEN 7.5-325 MG PO TABS: 1.0000 | ORAL_TABLET | ORAL | Status: DC | PRN

## 2015-01-01 MED ORDER — TIZANIDINE HCL 4 MG PO TABS: 4.0000 mg | ORAL_TABLET | Freq: Four times a day (QID) | ORAL | Status: DC | PRN

## 2015-01-01 MED ORDER — ASPIRIN 325 MG PO TABS
325.0000 mg | ORAL_TABLET | Freq: Two times a day (BID) | ORAL | Status: DC
  Administered 2015-01-01: 325 mg via ORAL
  Filled 2015-01-01 (×3): qty 1

## 2015-01-01 MED ORDER — POLYETHYLENE GLYCOL 3350 17 G PO PACK: 17.0000 g | PACK | Freq: Two times a day (BID) | ORAL | Status: DC

## 2015-01-01 NOTE — Progress Notes (Signed)
     Subjective: 1 Day Post-Op Procedure(s) (LRB): LEFT TOTAL HIP ARTHROPLASTY ANTERIOR APPROACH (Left)   Patient reports pain as mild, pain controlled. No events throughout the night. Ready to be discharged home if she does well with PT and pain stays controlled.   Objective:   VITALS:   Filed Vitals:   01/01/15 0623  BP: 94/56  Pulse: 63  Temp: 98.2 F (36.8 C)  Resp: 16    Dorsiflexion/Plantar flexion intact Incision: dressing C/D/I No cellulitis present Compartment soft  LABS  Recent Labs  01/01/15 0507  HGB 9.6*  HCT 29.5*  WBC 8.4  PLT 154     Recent Labs  01/01/15 0507  NA 139  K 4.5  BUN 9  CREATININE 0.66  GLUCOSE 121*     Assessment/Plan: 1 Day Post-Op Procedure(s) (LRB): LEFT TOTAL HIP ARTHROPLASTY ANTERIOR APPROACH (Left) Foley cath d/c'ed Advance diet Up with therapy D/C IV fluids Discharge home with home health  Follow up in 2 weeks at [facility]. Follow up with OLIN,Cortne Amara D in 2 weeks.  Contact information:  [facility] [address] [place]   PAC  01/01/2015, 9:34 AM

## 2015-01-01 NOTE — Progress Notes (Signed)
Physical Therapy Treatment Patient Details Name: Felicia Hunter MRN: [id number] DOB: [birth year] Today's Date: 01/01/2015    History of Present Illness s/p L  DA THA; PMH CVA, breast CA    PT Comments    POD # 1 am session.  Assisyed pt OOB to amb a greater distance in hallway then returned to room to perform THR TE's followed by ICE.   Follow Up Recommendations  Home health PT;Supervision for mobility/OOB     Equipment Recommendations  None recommended by PT    Recommendations for Other Services       Precautions / Restrictions Precautions Precautions: Fall Restrictions Weight Bearing Restrictions: No Other Position/Activity Restrictions: WBAT LLE    Mobility  Bed Mobility Overal bed mobility: Needs Assistance Bed Mobility: Supine to Sit     Supine to sit: Min assist     General bed mobility comments: assist L LE  Transfers Overall transfer level: Needs assistance Equipment used: Rolling walker (2 wheeled)   Sit to Stand: Min guard         General transfer comment: <25% VC's on proper tech and hand placement  Ambulation/Gait Ambulation/Gait assistance: Min guard;Min assist Ambulation Distance (Feet): 55 Feet Assistive device: Rolling walker (2 wheeled) Gait Pattern/deviations: Step-to pattern     General Gait Details: cues for RW safety, step length and RW position   Stairs            Wheelchair Mobility    Modified Rankin (Stroke Patients Only)       Balance                                    Cognition Arousal/Alertness: Awake/alert Behavior During Therapy: WFL for tasks assessed/performed Overall Cognitive Status: Within Functional Limits for tasks assessed                      Exercises   Total Hip Replacement TE's 10 reps ankle pumps 10 reps knee presses 10 reps heel slides 10 reps SAQ's 10 reps ABD Followed by ICE    General Comments        Pertinent Vitals/Pain Pain Assessment: 0-10 Pain  Score: 4  Pain Location: L hip Pain Descriptors / Indicators: Constant;Sore Pain Intervention(s): Monitored during session;Premedicated before session;Repositioned;Ice applied    Home Living                      Prior Function            PT Goals (current goals can now be found in the care plan section) Progress towards PT goals: Progressing toward goals    Frequency  7X/week    PT Plan Current plan remains appropriate    Co-evaluation             End of Session Equipment Utilized During Treatment: Gait belt Activity Tolerance: Patient tolerated treatment well Patient left: in chair;with call bell/phone within reach     Time: 1125-1150 PT Time Calculation (min) (ACUTE ONLY): 25 min  Charges:  $Gait Training: 8-22 mins $Therapeutic Exercise: 8-22 mins                    G Codes:      Rica Koyanagi  PTA [facility]  Acute  Rehab Pager      [phone]

## 2015-01-01 NOTE — Telephone Encounter (Signed)
Spoke to [employer], Retail buyer and referred him to dr. Leonie Man. Dr. Leonie Man spoke to the PA and advised  Stopping the study medication during the time when patient will be on DVT prophylaxis and then restart the study medicine.

## 2015-01-01 NOTE — Telephone Encounter (Signed)
Rn receive a call from [employer] for [employer]. Matt stated patient is in husband having her hip surgery done. Matt wanted to know about the dosage for aspirin and pradaxa dosage. Rn explain to [place] that patient was in a research blind study. Rn transferred the call to [place] in research. Rizwan talk to [place] from [place].

## 2015-01-01 NOTE — Progress Notes (Signed)
Physical Therapy Treatment Patient Details Name: Felicia Hunter MRN: [id number] DOB: 1940-09-05 Today's Date: 01/01/2015    History of Present Illness s/p L  DA THA; PMH CVA, breast CA    PT Comments    POD # 1 pm session.  Assisted with amb in hallway then practiced stairs.  Pt very knowledgeably as this is her 3rd joint replacement.   Pt progressing well.  Ready for D/C to home.   Follow Up Recommendations  Home health PT;Supervision for mobility/OOB     Equipment Recommendations  None recommended by PT    Recommendations for Other Services       Precautions / Restrictions Precautions Precautions: Fall Restrictions Weight Bearing Restrictions: No Other Position/Activity Restrictions: WBAT LLE    Mobility  Bed Mobility Overal bed mobility: Needs Assistance Bed Mobility: Supine to Sit     Supine to sit: Min assist     General bed mobility comments: assist L LE  Transfers Overall transfer level: Needs assistance Equipment used: Rolling walker (2 wheeled)   Sit to Stand: Min guard         General transfer comment: <25% VC's on proper tech and hand placement  Ambulation/Gait Ambulation/Gait assistance: Min guard;Min assist Ambulation Distance (Feet): 55 Feet Assistive device: Rolling walker (2 wheeled) Gait Pattern/deviations: Step-to pattern     General Gait Details: cues for RW safety, step length and RW position   Stairs Stairs: Yes Stairs assistance: Min guard Stair Management: Two rails;Step to pattern;Forwards Number of Stairs: 2 General stair comments: one VC for safey.    Wheelchair Mobility    Modified Rankin (Stroke Patients Only)       Balance                                    Cognition Arousal/Alertness: Awake/alert Behavior During Therapy: WFL for tasks assessed/performed Overall Cognitive Status: Within Functional Limits for tasks assessed                      Exercises      General Comments         Pertinent Vitals/Pain Pain Assessment: 0-10 Pain Score: 4  Pain Location: L hip Pain Descriptors / Indicators: Constant;Sore Pain Intervention(s): Monitored during session;Premedicated before session;Repositioned;Ice applied    Home Living                      Prior Function            PT Goals (current goals can now be found in the care plan section) Progress towards PT goals: Progressing toward goals    Frequency  7X/week    PT Plan Current plan remains appropriate    Co-evaluation             End of Session Equipment Utilized During Treatment: Gait belt Activity Tolerance: Patient tolerated treatment well Patient left: in chair;with call bell/phone within reach     Time: 1405-1430 PT Time Calculation (min) (ACUTE ONLY): 25 min  Charges:  $Gait Training: 8-22 mins $Therapeutic Activity: 8-22 mins                    G Codes:      Rica Koyanagi  PTA [facility]  Acute  Rehab Pager      [phone]

## 2015-01-01 NOTE — Evaluation (Signed)
Occupational Therapy Evaluation Patient Details Name: ZARYA LASSEIGNE MRN: [id number] DOB: February 21, 1941 Today's Date: 01/01/2015    History of Present Illness s/p L  DA THA; PMH CVA, breast CA   Clinical Impression   This 74 year old female was admitted for the above surgery. All education was completed.  No further OT is needed at this time    Follow Up Recommendations  No OT follow up;Supervision/Assistance - 24 hour    Equipment Recommendations  None recommended by OT    Recommendations for Other Services       Precautions / Restrictions Precautions Precautions: Fall Restrictions Weight Bearing Restrictions: No      Mobility Bed Mobility         Supine to sit: Min assist     General bed mobility comments: light min A for LLE:  Pt used arms to assist leg also  Transfers   Equipment used: Rolling walker (2 wheeled) Transfers: Sit to/from Stand Sit to Stand: Min guard         General transfer comment: cues for controlling descent on bed    Balance                                            ADL Overall ADL's : Needs assistance/impaired     Grooming: Wash/dry hands;Wash/dry face;Oral care;Supervision/safety;Standing   Upper Body Bathing: Set up;Sitting   Lower Body Bathing: Minimal assistance;Sit to/from stand   Upper Body Dressing : Set up;Sitting   Lower Body Dressing: Moderate assistance;Sit to/from stand   Toilet Transfer: Min guard;BSC;Ambulation   Toileting- Water quality scientist and Hygiene: Min guard;Sit to/from stand   Tub/ Shower Transfer: Walk-in shower;Min guard;Ambulation     General ADL Comments: performed ADL from toilet and donned underwear from EOB.  Cues for sequence for shower transfer and cues to control descent when sitting on bed     Vision     Perception     Praxis      Pertinent Vitals/Pain Pain Score: 4  Pain Location: L hip Pain Descriptors / Indicators: Sore Pain Intervention(s):  Limited activity within patient's tolerance;Monitored during session;Premedicated before session;Repositioned;Ice applied     Hand Dominance     Extremity/Trunk Assessment Upper Extremity Assessment Upper Extremity Assessment: Overall WFL for tasks assessed           Communication Communication Communication: No difficulties   Cognition Arousal/Alertness: Awake/alert Behavior During Therapy: WFL for tasks assessed/performed Overall Cognitive Status: Within Functional Limits for tasks assessed                     General Comments       Exercises       Shoulder Instructions      Home Living Family/patient expects to be discharged to:: Private residence Living Arrangements: Alone Available Help at Discharge: Family;Available PRN/intermittently               Bathroom Shower/Tub: Walk-in Psychologist, prison and probation services: Standard     Home Equipment: Environmental consultant - 2 wheels;Cane - single point;Bedside commode;Shower seat   Additional Comments: 2 daughters will stay with pt for a week (switching off)      Prior Functioning/Environment Level of Independence: Independent             OT Diagnosis: Acute pain   OT Problem List:     OT Treatment/Interventions:  OT Goals(Current goals can be found in the care plan section) Acute Rehab OT Goals Patient Stated Goal: return to I  OT Frequency:     Barriers to D/C:            Co-evaluation              End of Session    Activity Tolerance: Patient tolerated treatment well Patient left: in bed;with call bell/phone within reach   Time: 0802-0828 OT Time Calculation (min): 26 min Charges:  OT General Charges $OT Visit: 1 Procedure OT Evaluation $Initial OT Evaluation Tier I: 1 Procedure OT Treatments $Self Care/Home Management : 8-22 mins G-Codes:    Claudia Alvizo 24-Jan-2015, 8:36 AM  Lesle Chris, OTR/L [phone] 01/24/2015

## 2015-01-01 NOTE — Care Management Note (Signed)
Case Management Note  Patient Details  Name: LONNI DIRDEN MRN: [id number] Date of Birth: 11/21/1940  Subjective/Objective:                   LEFT TOTAL HIP ARTHROPLASTY ANTERIOR APPROACH (Left) Action/Plan: CM met with pt in room to offer choice of home health agency.  Pt chooses [facility] to render HHPT.  Address and contact information  verified by pt.  Referral emailed to [employer], Tim.  Pt  Has DME from previous surgery.  No other CM needs were communicated.  Expected Discharge Date:                  Expected Discharge Plan:  [place]  In-House Referral:     Discharge planning Services  CM Consult  Post Acute Care Choice:  Home Health Choice offered to:  Patient  DME Arranged:  N/A DME Agency:  NA  HH Arranged:  PT HH Agency:  [facility]  Status of Service:  Completed, signed off  Medicare Important Message Given:    Date Medicare IM Given:    Medicare IM give by:    Date Additional Medicare IM Given:    Additional Medicare Important Message give by:     If discussed at [place] of Stay Meetings, dates discussed:    Additional Comments:  Dellie Catholic, RN 01/01/2015, 11:33 AM

## 2015-01-02 NOTE — Progress Notes (Signed)
LOOP RECORDER  

## 2015-01-03 DIAGNOSIS — M1990 Unspecified osteoarthritis, unspecified site: Secondary | ICD-10-CM | POA: Diagnosis not present

## 2015-01-03 DIAGNOSIS — M8580 Other specified disorders of bone density and structure, unspecified site: Secondary | ICD-10-CM | POA: Diagnosis not present

## 2015-01-03 DIAGNOSIS — Z8673 Personal history of transient ischemic attack (TIA), and cerebral infarction without residual deficits: Secondary | ICD-10-CM | POA: Diagnosis not present

## 2015-01-03 DIAGNOSIS — J45909 Unspecified asthma, uncomplicated: Secondary | ICD-10-CM | POA: Diagnosis not present

## 2015-01-03 DIAGNOSIS — Z471 Aftercare following joint replacement surgery: Secondary | ICD-10-CM | POA: Diagnosis not present

## 2015-01-03 DIAGNOSIS — Z96643 Presence of artificial hip joint, bilateral: Secondary | ICD-10-CM | POA: Diagnosis not present

## 2015-01-06 DIAGNOSIS — M8580 Other specified disorders of bone density and structure, unspecified site: Secondary | ICD-10-CM | POA: Diagnosis not present

## 2015-01-06 DIAGNOSIS — Z8673 Personal history of transient ischemic attack (TIA), and cerebral infarction without residual deficits: Secondary | ICD-10-CM | POA: Diagnosis not present

## 2015-01-06 DIAGNOSIS — M1990 Unspecified osteoarthritis, unspecified site: Secondary | ICD-10-CM | POA: Diagnosis not present

## 2015-01-06 DIAGNOSIS — J45909 Unspecified asthma, uncomplicated: Secondary | ICD-10-CM | POA: Diagnosis not present

## 2015-01-06 DIAGNOSIS — Z471 Aftercare following joint replacement surgery: Secondary | ICD-10-CM | POA: Diagnosis not present

## 2015-01-06 DIAGNOSIS — Z96643 Presence of artificial hip joint, bilateral: Secondary | ICD-10-CM | POA: Diagnosis not present

## 2015-01-07 NOTE — Discharge Summary (Signed)
Physician Discharge Summary  Patient ID: Felicia Hunter MRN: [id number] DOB/AGE: 1940/09/05 74 y.o.  Admit date: 12/31/2014 Discharge date: 01/01/2015   Procedures:  Procedure(s) (LRB): LEFT TOTAL HIP ARTHROPLASTY ANTERIOR APPROACH (Left)  Attending Physician:  Dr. Paralee Cancel   Admission Diagnoses:   Left hip primary OA / pain  Discharge Diagnoses:  Principal Problem:   S/P left THA, AA  Past Medical History  Diagnosis Date  . GERD (gastroesophageal reflux disease)   . Arthritis   . Cough     PERSISTANT COUGH X 20 YRS  . Tubulovillous adenoma of colon 08/2003  . Diverticulosis   . Hyperlipidemia   . Osteopenia   . Alopecia   . MVP (mitral valve prolapse)   . Clavicle fracture     right clavicle-hardware retained  . Disorders of bilirubin excretion   . Difficulty sleeping     takes Ambien  . Breast cancer ([place])   . Stroke ([place]) 06/07/11    x2 episodes-"funny feeling"" equilibrium off x24 hours, x1 speech affected- no residuallast 1 yr ago.  . Environmental and seasonal allergies     "dry cough", related to ? allergies    HPI:    Felicia Hunter, 74 y.o. female, has a history of pain and functional disability in the left hip(s) due to arthritis and patient has failed non-surgical conservative treatments for greater than 12 weeks to include NSAID's and/or analgesics, corticosteriod injections and activity modification. Onset of symptoms was gradual starting months ago with rapidlly worsening course since that time.The patient noted prior procedures of the hip to include arthroplasty on the right hip(s). Patient currently rates pain in the left hip at 8 out of 10 with activity. Patient has night pain, worsening of pain with activity and weight bearing, trendelenberg gait, pain that interfers with activities of daily living and pain with passive range of motion. Patient has evidence of periarticular osteophytes and joint space narrowing by imaging studies. This condition  presents safety issues increasing the risk of falls.There is no current active infection. Risks, benefits and expectations were discussed with the patient. Risks including but not limited to the risk of anesthesia, blood clots, nerve damage, blood vessel damage, failure of the prosthesis, infection and up to and including death. Patient understand the risks, benefits and expectations and wishes to proceed with surgery.   PCP: Horatio Pel, MD   Discharged Condition: good  Hospital Course:  Patient underwent the above stated procedure on 12/31/2014. Patient tolerated the procedure well and brought to the recovery room in good condition and subsequently to the floor.  POD #1 BP: 94/56 ; Pulse: 63 ; Temp: 98.2 F (36.8 C) ; Resp: 16 Patient reports pain as mild, pain controlled. No events throughout the night. Ready to be discharged home if she does well with PT and pain stays controlled.  Dorsiflexion/plantar flexion intact, incision: dressing C/D/I, no cellulitis present and compartment soft.   LABS  Basename    HGB  9.6  HCT  29.5    Discharge Exam: General appearance: alert, cooperative and no distress Extremities: Homans sign is negative, no sign of DVT, no edema, redness or tenderness in the calves or thighs and no ulcers, gangrene or trophic changes  Disposition: Home with follow up in 2 weeks   Follow-up Information    Follow up with [name], MD. Schedule an appointment as soon as possible for a visit in 2 weeks.   Specialty:  Orthopedic Surgery   Contact information:  [address] [place] [address] [phone]       Follow up with [facility].   Why:  home health physical therapy   Contact information:   [address] [place] [address] [phone]       Discharge Instructions    Call MD / Call 911    Complete by:  As directed   If you experience chest pain or shortness of breath, CALL 911 and be  transported to the hospital emergency room.  If you develope a fever above 101 F, pus (white drainage) or increased drainage or redness at the wound, or calf pain, call your surgeon's office.     Change dressing    Complete by:  As directed   Maintain surgical dressing until follow up in the clinic. If the edges start to pull up, may reinforce with tape. If the dressing is no longer working, may remove and cover with gauze and tape, but must keep the area dry and clean.  Call with any questions or concerns.     Constipation Prevention    Complete by:  As directed   Drink plenty of fluids.  Prune juice may be helpful.  You may use a stool softener, such as Colace (over the counter) 100 mg twice a day.  Use MiraLax (over the counter) for constipation as needed.     Diet - low sodium heart healthy    Complete by:  As directed      Discharge instructions    Complete by:  As directed   Maintain surgical dressing until follow up in the clinic. If the edges start to pull up, may reinforce with tape. If the dressing is no longer working, may remove and cover with gauze and tape, but must keep the area dry and clean.  Follow up in 2 weeks at [facility]. Call with any questions or concerns.     Increase activity slowly as tolerated    Complete by:  As directed   Weight bearing as tolerated with assist device (walker, cane, etc) as directed, use it as long as suggested by your surgeon or therapist, typically at least 4-6 weeks.     TED hose    Complete by:  As directed   Use stockings (TED hose) for 2 weeks on both leg(s).  You may remove them at night for sleeping.             Medication List    STOP taking these medications        ibuprofen 200 MG tablet  Commonly known as:  ADVIL,MOTRIN     Investigational - Study Medication      TAKE these medications        aspirin 325 MG tablet  Take 1 tablet (325 mg total) by mouth 2 (two) times daily.     atorvastatin 20 MG tablet    Commonly known as:  LIPITOR  Take 20 mg by mouth every evening.     BIOTIN PO  Take 1 tablet by mouth daily.     CALCIUM 600 600 MG Tabs tablet  Generic drug:  calcium carbonate  Take 600 mg by mouth daily.     cholecalciferol 1000 UNITS tablet  Commonly known as:  VITAMIN D  Take 1,000 Units by mouth daily.     docusate sodium 100 MG capsule  Commonly known as:  COLACE  Take 1 capsule (100 mg total) by mouth 2 (two) times daily.  ferrous sulfate 325 (65 FE) MG tablet  Take 1 tablet (325 mg total) by mouth 3 (three) times daily after meals.     glucosamine-chondroitin 500-400 MG tablet  Take 1 tablet by mouth daily.     HYDROcodone-acetaminophen 7.5-325 MG tablet  Commonly known as:  NORCO  Take 1-2 tablets by mouth every 4 (four) hours as needed for moderate pain.     MULTIVITAMIN & MINERAL PO  Take by mouth every morning.     omega-3 acid ethyl esters 1 G capsule  Commonly known as:  LOVAZA  Take 1 g by mouth 2 (two) times daily.     polyethylene glycol packet  Commonly known as:  MIRALAX / GLYCOLAX  Take 17 g by mouth 2 (two) times daily.     tiZANidine 4 MG tablet  Commonly known as:  ZANAFLEX  Take 1 tablet (4 mg total) by mouth every 6 (six) hours as needed for muscle spasms.     vitamin C 500 MG tablet  Commonly known as:  ASCORBIC ACID  Take 500 mg by mouth daily.         Signed: [place]. Meenakshi Sazama   PA-C  01/07/2015, 12:07 PM

## 2015-01-08 DIAGNOSIS — Z471 Aftercare following joint replacement surgery: Secondary | ICD-10-CM | POA: Diagnosis not present

## 2015-01-08 DIAGNOSIS — Z96643 Presence of artificial hip joint, bilateral: Secondary | ICD-10-CM | POA: Diagnosis not present

## 2015-01-08 DIAGNOSIS — M8580 Other specified disorders of bone density and structure, unspecified site: Secondary | ICD-10-CM | POA: Diagnosis not present

## 2015-01-08 DIAGNOSIS — Z8673 Personal history of transient ischemic attack (TIA), and cerebral infarction without residual deficits: Secondary | ICD-10-CM | POA: Diagnosis not present

## 2015-01-08 DIAGNOSIS — M1990 Unspecified osteoarthritis, unspecified site: Secondary | ICD-10-CM | POA: Diagnosis not present

## 2015-01-08 DIAGNOSIS — J45909 Unspecified asthma, uncomplicated: Secondary | ICD-10-CM | POA: Diagnosis not present

## 2015-01-10 DIAGNOSIS — Z471 Aftercare following joint replacement surgery: Secondary | ICD-10-CM | POA: Diagnosis not present

## 2015-01-10 DIAGNOSIS — Z96643 Presence of artificial hip joint, bilateral: Secondary | ICD-10-CM | POA: Diagnosis not present

## 2015-01-10 DIAGNOSIS — M8580 Other specified disorders of bone density and structure, unspecified site: Secondary | ICD-10-CM | POA: Diagnosis not present

## 2015-01-10 DIAGNOSIS — J45909 Unspecified asthma, uncomplicated: Secondary | ICD-10-CM | POA: Diagnosis not present

## 2015-01-10 DIAGNOSIS — M1990 Unspecified osteoarthritis, unspecified site: Secondary | ICD-10-CM | POA: Diagnosis not present

## 2015-01-10 DIAGNOSIS — Z8673 Personal history of transient ischemic attack (TIA), and cerebral infarction without residual deficits: Secondary | ICD-10-CM | POA: Diagnosis not present

## 2015-01-15 DIAGNOSIS — Z96642 Presence of left artificial hip joint: Secondary | ICD-10-CM | POA: Diagnosis not present

## 2015-01-15 DIAGNOSIS — Z471 Aftercare following joint replacement surgery: Secondary | ICD-10-CM | POA: Diagnosis not present

## 2015-01-16 DIAGNOSIS — Z471 Aftercare following joint replacement surgery: Secondary | ICD-10-CM | POA: Diagnosis not present

## 2015-01-16 DIAGNOSIS — Z96643 Presence of artificial hip joint, bilateral: Secondary | ICD-10-CM | POA: Diagnosis not present

## 2015-01-16 DIAGNOSIS — M8580 Other specified disorders of bone density and structure, unspecified site: Secondary | ICD-10-CM | POA: Diagnosis not present

## 2015-01-16 DIAGNOSIS — M1990 Unspecified osteoarthritis, unspecified site: Secondary | ICD-10-CM | POA: Diagnosis not present

## 2015-01-16 DIAGNOSIS — J45909 Unspecified asthma, uncomplicated: Secondary | ICD-10-CM | POA: Diagnosis not present

## 2015-01-16 DIAGNOSIS — Z8673 Personal history of transient ischemic attack (TIA), and cerebral infarction without residual deficits: Secondary | ICD-10-CM | POA: Diagnosis not present

## 2015-01-21 LAB — CUP PACEART REMOTE DEVICE CHECK: MDC IDC SESS DTM: 20161019220611

## 2015-01-21 NOTE — Progress Notes (Signed)
[facility]link summary report received. Battery status OK. Normal device function. No new symptom episodes, tachy episodes, brady, or pause episodes. No new AF episodes. Monthly summary reports and ROV with GT on 01/31/15 at 3:00pm.

## 2015-01-28 ENCOUNTER — Encounter: Payer: Self-pay | Admitting: *Deleted

## 2015-01-31 ENCOUNTER — Encounter: Payer: Self-pay | Admitting: Internal Medicine

## 2015-01-31 ENCOUNTER — Ambulatory Visit (INDEPENDENT_AMBULATORY_CARE_PROVIDER_SITE_OTHER): Payer: Medicare Other | Admitting: Internal Medicine

## 2015-01-31 VITALS — BP 146/90 | HR 72 | Ht 66.0 in | Wt 138.4 lb

## 2015-01-31 DIAGNOSIS — R55 Syncope and collapse: Secondary | ICD-10-CM

## 2015-01-31 DIAGNOSIS — I6310 Cerebral infarction due to embolism of unspecified precerebral artery: Secondary | ICD-10-CM | POA: Diagnosis not present

## 2015-01-31 NOTE — Assessment & Plan Note (Signed)
ILR interogation demonstrates no brady or tachy arrhythmias.

## 2015-01-31 NOTE — Progress Notes (Signed)
HPI  Felicia Hunter returns today for followup. She has a h/o cryptogenic stroke, s/p ILR insertion. In the interim, she has had hip replacement surgery. She is doing well. No atrial fib. She has minimal residual from her prior stroke. Allergies  Allergen Reactions  . Sulfonamide Derivatives Rash     Current Outpatient Prescriptions  Medication Sig Dispense Refill  . atorvastatin (LIPITOR) 20 MG tablet Take 20 mg by mouth every evening.     Marland Kitchen BIOTIN PO Take 1 tablet by mouth daily.    . calcium carbonate (CALCIUM 600) 600 MG TABS Take 600 mg by mouth daily.     . cholecalciferol (VITAMIN D) 1000 UNITS tablet Take 1,000 Units by mouth daily.    Marland Kitchen glucosamine-chondroitin 500-400 MG tablet Take 1 tablet by mouth daily.     . Investigational - Study Medication Additional Study Details: Pt unsure if this is Plavix or ASA    . Multiple Vitamins-Minerals (MULTIVITAMIN & MINERAL PO) Take by mouth every morning.    Marland Kitchen omega-3 acid ethyl esters (LOVAZA) 1 G capsule Take 1 g by mouth 2 (two) times daily.     . vitamin C (ASCORBIC ACID) 500 MG tablet Take 500 mg by mouth continuous as needed.      No current facility-administered medications for this visit.     Past Medical History  Diagnosis Date  . GERD (gastroesophageal reflux disease)   . Arthritis   . Cough     PERSISTANT COUGH X 20 YRS  . Tubulovillous adenoma of colon 08/2003  . Diverticulosis   . Hyperlipidemia   . Osteopenia   . Alopecia   . MVP (mitral valve prolapse)   . Clavicle fracture     right clavicle-hardware retained  . Disorders of bilirubin excretion   . Difficulty sleeping     takes Ambien  . Breast cancer ([place])   . Stroke ([place]) 06/07/11    x2 episodes-"funny feeling"" equilibrium off x24 hours, x1 speech affected- no residuallast 1 yr ago.  . Environmental and seasonal allergies     "dry cough", related to ? allergies    ROS:   All systems reviewed and negative except as noted in the HPI.   Past  Surgical History  Procedure Laterality Date  . Collarbone  september 2011    pinned and plated, right  . Abdominal hysterectomy  1993  . Vein surgery  1974    left leg  . Nasal septum surgery    . Tonsillectomy    . Joint replacement  july 9th, 2012    left knee replacement   . Knee reconstruction  7/12    revision lt total knee  . Breast enhancement surgery      x3-then removed 2011 for bilat mastectomies  . Chest wall tumor excision  5/12    noduals removed-neg  . Breast capsulotomy      after bilat mastectomies-implants removed  . Breast surgery  june 2011    double mastectomy nodes from rt  . Mass excision Left 06/19/2012    Procedure: EXCISION OF LARGE MASS LEFT NECK/SHOULDER WITH PLASTIC CLOSURE;  Surgeon: Cristine Polio, MD;  Location: [place];  Service: Plastics;  Laterality: Left;  . Total hip arthroplasty Right 04/10/2013    Procedure: RIGHT TOTAL HIP ARTHROPLASTY ANTERIOR APPROACH;  Surgeon: [name], MD;  Location: [facility];  Service: Orthopedics;  Laterality: Right;  . Tee without cardioversion N/A 11/07/2013    Procedure: TRANSESOPHAGEAL  ECHOCARDIOGRAM (TEE);  Surgeon: Dorothy Spark, MD;  Location: [place];  Service: Cardiovascular;  Laterality: N/A;  . Loop recorder implant N/A 11/07/2013    Procedure: LOOP RECORDER IMPLANT;  Surgeon: Evans Lance, MD;  Location: [facility] CATH LAB;  Service: Cardiovascular;  Laterality: N/A;  . Total hip arthroplasty Left 12/31/2014    Procedure: LEFT TOTAL HIP ARTHROPLASTY ANTERIOR APPROACH;  Surgeon: Paralee Cancel, MD;  Location: [facility];  Service: Orthopedics;  Laterality: Left;     Family History  Problem Relation Age of Onset  . Aneurysm Father   . Colon cancer Neg Hx      Social History   Social History  . Marital Status: Widowed    Spouse Name: N/A  . Number of Children: 2  . Years of Education: college   Occupational History  . retired   .     Social History Main Topics  . Smoking  status: Never Smoker   . Smokeless tobacco: Never Used  . Alcohol Use: 0.6 oz/week    1 Glasses of wine per week     Comment: 1 glass of wine per night  . Drug Use: No  . Sexual Activity: Not on file   Other Topics Concern  . Not on file   Social History Narrative   Patient is single with 2 children.    Patient is left handed.   Patient has college education.   Patient drinks coffee daily     BP 146/90 mmHg  Pulse 72  Ht 5\' 6"  (1.676 m)  Wt 138 lb 6.4 oz (62.778 kg)  BMI 22.35 kg/m2  Physical Exam:  Well appearing 74 yo woman, NAD HEENT: Unremarkable Neck:  No JVD, no thyromegally Lymphatics:  No adenopathy Back:  No CVA tenderness Lungs:  Clear with no wheezes HEART:  Regular rate rhythm, no murmurs, no rubs, no clicks Abd:  soft, positive bowel sounds, no organomegally, no rebound, no guarding Ext:  2 plus pulses, no edema, no cyanosis, no clubbing Skin:  No rashes no nodules Neuro:  CN II through XII intact, motor grossly intact   DEVICE  Normal device function.  See PaceArt for details.   Assess/Plan:

## 2015-01-31 NOTE — Assessment & Plan Note (Signed)
The etiology of her stroke is unclear. She has had no atrial fib.

## 2015-01-31 NOTE — Patient Instructions (Signed)

## 2015-02-02 LAB — CUP PACEART INCLINIC DEVICE CHECK: Date Time Interrogation Session: 20161118210439

## 2015-02-03 ENCOUNTER — Encounter (INDEPENDENT_AMBULATORY_CARE_PROVIDER_SITE_OTHER): Payer: Self-pay

## 2015-02-03 DIAGNOSIS — Z0289 Encounter for other administrative examinations: Secondary | ICD-10-CM

## 2015-02-05 DIAGNOSIS — H04123 Dry eye syndrome of bilateral lacrimal glands: Secondary | ICD-10-CM | POA: Diagnosis not present

## 2015-02-05 DIAGNOSIS — H16103 Unspecified superficial keratitis, bilateral: Secondary | ICD-10-CM | POA: Diagnosis not present

## 2015-02-05 DIAGNOSIS — H43813 Vitreous degeneration, bilateral: Secondary | ICD-10-CM | POA: Diagnosis not present

## 2015-02-05 DIAGNOSIS — H25813 Combined forms of age-related cataract, bilateral: Secondary | ICD-10-CM | POA: Diagnosis not present

## 2015-02-13 DIAGNOSIS — Z96642 Presence of left artificial hip joint: Secondary | ICD-10-CM | POA: Diagnosis not present

## 2015-02-13 DIAGNOSIS — Z471 Aftercare following joint replacement surgery: Secondary | ICD-10-CM | POA: Diagnosis not present

## 2015-02-20 ENCOUNTER — Telehealth: Payer: Self-pay

## 2015-02-20 ENCOUNTER — Telehealth: Payer: Self-pay | Admitting: *Deleted

## 2015-02-20 DIAGNOSIS — M8580 Other specified disorders of bone density and structure, unspecified site: Secondary | ICD-10-CM | POA: Diagnosis not present

## 2015-02-20 DIAGNOSIS — E7800 Pure hypercholesterolemia, unspecified: Secondary | ICD-10-CM | POA: Diagnosis not present

## 2015-02-20 DIAGNOSIS — I480 Paroxysmal atrial fibrillation: Secondary | ICD-10-CM

## 2015-02-20 DIAGNOSIS — Z8673 Personal history of transient ischemic attack (TIA), and cerebral infarction without residual deficits: Secondary | ICD-10-CM | POA: Diagnosis not present

## 2015-02-20 DIAGNOSIS — R05 Cough: Secondary | ICD-10-CM | POA: Diagnosis not present

## 2015-02-20 MED ORDER — RIVAROXABAN 20 MG PO TABS: 20.0000 mg | ORAL_TABLET | Freq: Every day | ORAL | Status: DC

## 2015-02-20 NOTE — Telephone Encounter (Signed)
Called and left VM to return call to [phone]. Please tell pt to stop study medication ( R esus trial).

## 2015-02-20 NOTE — Telephone Encounter (Signed)
Spoke to the pt and advised her to stop the trial medicine per dr. Leonie Man and use new medicine prescribed by the cardio. Kennyth Lose will schedule the EOT visit within next 2 wks.

## 2015-02-20 NOTE — Telephone Encounter (Signed)
Called patient to let her know that Dr. Lovena Le has diagnosed her with atrial fibrillation based on episodes transmitted from her loop recorder.  After speaking with Jaci Standard from [facility], patient was advised to discontinue her RESPECT-ESUS trial medication, return it to [facility], and start Xarelto 20mg  daily per Dr. Lovena Le.  Appointments scheduled for patient with Tommye Standard, PA on 02/27/15 to discuss atrial fibrillation and with Dr. Lovena Le for 6 week follow-up on 04/01/15.  Patient is agreeable to this treatment plan.  She verbalizes understanding of all instructions and denies additional questions or concerns at this time.

## 2015-02-21 ENCOUNTER — Telehealth: Payer: Self-pay

## 2015-02-21 NOTE — Telephone Encounter (Signed)
Prior auth for Xarelto 20mg sent to [facility]. 

## 2015-02-24 ENCOUNTER — Telehealth: Payer: Self-pay

## 2015-02-24 NOTE — Telephone Encounter (Signed)
Xarelto approved through 02/21/2016. PA# [id number].

## 2015-02-25 ENCOUNTER — Encounter: Payer: Self-pay | Admitting: Internal Medicine

## 2015-02-26 ENCOUNTER — Encounter: Payer: Self-pay | Admitting: Neurology

## 2015-02-26 ENCOUNTER — Ambulatory Visit (INDEPENDENT_AMBULATORY_CARE_PROVIDER_SITE_OTHER): Payer: Self-pay | Admitting: Neurology

## 2015-02-26 DIAGNOSIS — I6310 Cerebral infarction due to embolism of unspecified precerebral artery: Secondary | ICD-10-CM

## 2015-02-26 NOTE — Progress Notes (Signed)
Pt came in for end of study for RESPECT ESUS. She was found to have afib episode on loop recorder. Her investigational meds were discontinued and she was put on Xarelto 20mg  daily by cardiologist. She has been on Xarelto for 3-4 days no side effect so far. Her NIHSS = 0 and mRS = 0. She has no complains. She will be seen in neuro clinic in 6 months.   Hi, Katrina, could you please make an appointment for her with me in 6 months? Thanks   Rosalin Hawking, MD PhD Stroke Neurology 02/26/2015 5:51 PM

## 2015-02-27 ENCOUNTER — Encounter: Payer: Self-pay | Admitting: Physician Assistant

## 2015-02-27 ENCOUNTER — Ambulatory Visit (INDEPENDENT_AMBULATORY_CARE_PROVIDER_SITE_OTHER): Payer: Medicare Other | Admitting: Physician Assistant

## 2015-02-27 ENCOUNTER — Encounter: Payer: Self-pay | Admitting: Internal Medicine

## 2015-02-27 ENCOUNTER — Telehealth: Payer: Self-pay

## 2015-02-27 VITALS — BP 138/90 | HR 64 | Ht 66.0 in | Wt 141.0 lb

## 2015-02-27 DIAGNOSIS — I639 Cerebral infarction, unspecified: Secondary | ICD-10-CM | POA: Diagnosis not present

## 2015-02-27 DIAGNOSIS — D649 Anemia, unspecified: Secondary | ICD-10-CM | POA: Diagnosis not present

## 2015-02-27 DIAGNOSIS — I6310 Cerebral infarction due to embolism of unspecified precerebral artery: Secondary | ICD-10-CM

## 2015-02-27 DIAGNOSIS — I480 Paroxysmal atrial fibrillation: Secondary | ICD-10-CM

## 2015-02-27 LAB — CBC
HCT: 39.2 % (ref 36.0–46.0)
HEMOGLOBIN: 12.6 g/dL (ref 12.0–15.0)
MCH: 30.2 pg (ref 26.0–34.0)
MCHC: 32.1 g/dL (ref 30.0–36.0)
MCV: 94 fL (ref 78.0–100.0)
MPV: 10.7 fL (ref 8.6–12.4)
PLATELETS: 308 10*3/uL (ref 150–400)
RBC: 4.17 MIL/uL (ref 3.87–5.11)
RDW: 13.5 % (ref 11.5–15.5)
WBC: 5.8 10*3/uL (ref 4.0–10.5)

## 2015-02-27 NOTE — Progress Notes (Signed)
Cardiology Office Note Date:  02/27/2015  Patient ID:  Felicia Hunter, DOB [birth year], MRN [id number] PCP:  Horatio Pel, MD  Cardiologist/electrophysiologist:  Dr. Lovena Le   Chief Complaint: New PAF on ILR  History of Present Illness: Felicia Hunter is a 74 y.o. female with history of cryptogenic stroke, breast and colon cancer, HLD who was implanted an ILR after her CVA Aug 2015, (1st was 2013).  She was in a study with her neurologist, the RESPECT ESUS study, her investigational medicines were discontinued with the finding of PAFib, and she was started on Xarelto 20mg  daily on 02/21/15.  She saw Dr. Erlinda Hong yesterday.  She is feeling very well, exercises 5 days/week without intolerances outside of orthopedic limitations, no CP, palpitations or SOB, no dizziness, near syncope or syncope.  She comes today accompanied by her daughter, she feels very well, denies any noted symptoms of AF.  She has not noticed any bleeding or signs of bleeding since being started on Xarelto.   Past Medical History  Diagnosis Date  . GERD (gastroesophageal reflux disease)   . Arthritis   . Cough     PERSISTANT COUGH X 20 YRS  . Tubulovillous adenoma of colon 08/2003  . Diverticulosis   . Hyperlipidemia   . Osteopenia   . Alopecia   . MVP (mitral valve prolapse)   . Clavicle fracture     right clavicle-hardware retained  . Disorders of bilirubin excretion   . Difficulty sleeping     takes Ambien  . Breast cancer ([place])   . Stroke ([name]) 06/07/11    x2 episodes-"funny feeling"" equilibrium off x24 hours, x1 speech affected- no residuallast 1 yr ago.  . Environmental and seasonal allergies     "dry cough", related to ? allergies    Past Surgical History  Procedure Laterality Date  . Collarbone  september 2011    pinned and plated, right  . Abdominal hysterectomy  1993  . Vein surgery  1974    left leg  . Nasal septum surgery    . Tonsillectomy    . Joint replacement  july 9th, 2012    left  knee replacement   . Knee reconstruction  7/12    revision lt total knee  . Breast enhancement surgery      x3-then removed 2011 for bilat mastectomies  . Chest wall tumor excision  5/12    noduals removed-neg  . Breast capsulotomy      after bilat mastectomies-implants removed  . Breast surgery  june 2011    double mastectomy nodes from rt  . Mass excision Left 06/19/2012    Procedure: EXCISION OF LARGE MASS LEFT NECK/SHOULDER WITH PLASTIC CLOSURE;  Surgeon: Cristine Polio, MD;  Location: [place];  Service: Plastics;  Laterality: Left;  . Total hip arthroplasty Right 04/10/2013    Procedure: RIGHT TOTAL HIP ARTHROPLASTY ANTERIOR APPROACH;  Surgeon: [name], MD;  [facility];  Service: Orthopedics;  Laterality: Right;  . Tee without cardioversion N/A 11/07/2013    Procedure: TRANSESOPHAGEAL ECHOCARDIOGRAM (TEE);  Surgeon: Dorothy Spark, MD;  Location: [place];  Service: Cardiovascular;  Laterality: N/A;  . Loop recorder implant N/A 11/07/2013    Procedure: LOOP RECORDER IMPLANT;  Surgeon: Evans Lance, MD;  Location: [facility];  Service: Cardiovascular;  Laterality: N/A;  . Total hip arthroplasty Left 12/31/2014    Procedure: LEFT TOTAL HIP ARTHROPLASTY ANTERIOR APPROACH;  Surgeon: Paralee Cancel, MD;  [facility];  Service: Orthopedics;  Laterality: Left;    Current Outpatient Prescriptions  Medication Sig Dispense Refill  . atorvastatin (LIPITOR) 20 MG tablet Take 20 mg by mouth every evening.     Marland Kitchen BIOTIN PO Take 1 tablet by mouth daily.    . calcium carbonate (CALCIUM 600) 600 MG TABS Take 600 mg by mouth daily.     . cholecalciferol (VITAMIN D) 1000 UNITS tablet Take 1,000 Units by mouth daily.    Marland Kitchen glucosamine-chondroitin 500-400 MG tablet Take 1 tablet by mouth daily.     . Multiple Vitamins-Minerals (MULTIVITAMIN & MINERAL PO) Take by mouth every morning.    Marland Kitchen omega-3 acid ethyl esters (LOVAZA) 1 G capsule Take 1 g by mouth 2 (two)  times daily.     . rivaroxaban (XARELTO) 20 MG TABS tablet Take 1 tablet (20 mg total) by mouth daily with supper. 30 tablet 11  . vitamin C (ASCORBIC ACID) 500 MG tablet Take 500 mg by mouth continuous as needed.      No current facility-administered medications for this visit.    Allergies:   Sulfonamide derivatives   Social History:  The patient  reports that she has never smoked. She has never used smokeless tobacco. She reports that she drinks about 0.6 oz of alcohol per week. She reports that she does not use illicit drugs.   Family History:  The patient's family history includes Aneurysm in her father. There is no history of Colon cancer.  ROS:  Please see the history of present illness.    All other systems are reviewed and otherwise negative.   PHYSICAL EXAM: recheck on her BP is 140/84 VS:  BP 138/90 mmHg  Pulse 64  Ht 5\' 6"  (1.676 m)  Wt 141 lb (63.957 kg)  BMI 22.77 kg/m2 BMI: Body mass index is 22.77 kg/(m^2). Well nourished, well developed, in no acute distress HEENT: normocephalic, atraumatic Neck: no JVD, carotid bruits or masses Cardiac:  normal S1, S2; RRR; no significant murmurs, no rubs, or gallops Lungs:  clear to auscultation bilaterally, no wheezing, rhonchi or rales Abd: soft, nontender MS: no deformity or atrophy Ext: no edema Skin: warm and dry, no rash Neuro:  No gross deficits appreciated Psych: euthymic mood, full affect  ILR site is stable   EKG:  Done today shows SR APC ILR evaluation today notes the one brief event 02/11/15, tachycardic and irregular felt to be AF  11/07/13: TEE Impressions: - No cardiac source of emboli was indentified.  11/07/14: Echocardiogram Study Conclusions - Left ventricle: The cavity size was normal. Systolic function was normal. The estimated ejection fraction was in the range of 55% to 60%. Wall motion was normal; there were no regional wall motion abnormalities. - Left atrium: The atrium was mildly  dilated. - Atrial septum: Not well visualized no obvious ASA/PFO. There was increased thickness of the septum, consistent with lipomatous hypertrophy.  Recent Labs: 10/24/2014: ALT 14 01/01/2015: BUN 9; Creatinine, Ser 0.66; Hemoglobin 9.6*; Platelets 154; Potassium 4.5; Sodium 139   CrCl cannot be calculated (Patient has no serum creatinine result on file.).   Wt Readings from Last 3 Encounters:  02/27/15 141 lb (63.957 kg)  01/31/15 138 lb 6.4 oz (62.778 kg)  12/31/14 143 lb (64.864 kg)     Other studies reviewed: Additional studies/records reviewed today include: summarized above  DEVICE: Linq ILR Aug 2015 for cryptogenic stroke  ASSESSMENT AND PLAN:  1. New PAFib       CHADS2Vasc is at least 4,  started on Xarelto this month       Creat Cl by her last lab in Oct using her Nov weight is 73.9       Bleeding precautions and signs of bleeding discussed       Check her H/H today       We will continue her monthly ILR monitoring to establish her AF burden       she was not aware of PAF       She is asked to monitor her BP at home, she says generally is about 110-130/70-90   2. CVA     Follows with neurology     Reports no residual symptoms  Disposition: F/u with EP APP 6-8 weeks with new anticoagulation, sooner if needed  Current medicines are reviewed at length with the patient today.  The patient did not have any concerns regarding medicines.  Haywood Lasso, PA-C 02/27/2015 2:00 PM     [address] [address] [phone] (office)  [phone] (fax)

## 2015-02-27 NOTE — Telephone Encounter (Signed)
Rn call patient for appt in 6 months per Dr.Xu request. Pt given appt on 08/27/2014 at 0100pm, and to check in at 1245pm. Pt verbalized appt time and confirm with RN.

## 2015-02-27 NOTE — Patient Instructions (Addendum)
Medication Instructions: Your physician recommends that you continue on your current medications as directed. Please refer to the Current Medication list given to you today.]    If you need a refill on your cardiac medications before your next appointment, please call your pharmacy.  Labwork: CBC     Testing/Procedures: NONE ORDER TODAY    Follow-Up:  6 TO 8 WEEKS WITH AN AVAILABLE  APP FOR   NEW ANTICOAGULATION  MEDICATION    Any Other Special Instructions Will Be Listed Below (If Applicable).                                                                                                                                                   \

## 2015-03-03 ENCOUNTER — Ambulatory Visit (INDEPENDENT_AMBULATORY_CARE_PROVIDER_SITE_OTHER): Payer: Medicare Other | Admitting: *Deleted

## 2015-03-03 DIAGNOSIS — I639 Cerebral infarction, unspecified: Secondary | ICD-10-CM | POA: Diagnosis not present

## 2015-03-05 NOTE — Progress Notes (Signed)
Carelink Summary Report / Loop Recorder 

## 2015-03-06 ENCOUNTER — Telehealth: Payer: Self-pay | Admitting: *Deleted

## 2015-03-06 NOTE — Telephone Encounter (Signed)
-----   Message from [facility], [place] sent at 02/28/2015  8:22 AM EST ----- Please let her know her blood counts look great, no change to her medicine.  Thanks, [employer]

## 2015-03-12 ENCOUNTER — Ambulatory Visit (INDEPENDENT_AMBULATORY_CARE_PROVIDER_SITE_OTHER): Payer: Medicare Other | Admitting: Podiatry

## 2015-03-12 ENCOUNTER — Ambulatory Visit (INDEPENDENT_AMBULATORY_CARE_PROVIDER_SITE_OTHER): Payer: Medicare Other

## 2015-03-12 ENCOUNTER — Telehealth: Payer: Self-pay | Admitting: *Deleted

## 2015-03-12 ENCOUNTER — Encounter: Payer: Self-pay | Admitting: Podiatry

## 2015-03-12 VITALS — BP 148/85 | HR 84 | Resp 16

## 2015-03-12 DIAGNOSIS — M779 Enthesopathy, unspecified: Secondary | ICD-10-CM | POA: Diagnosis not present

## 2015-03-12 DIAGNOSIS — I6310 Cerebral infarction due to embolism of unspecified precerebral artery: Secondary | ICD-10-CM | POA: Diagnosis not present

## 2015-03-12 DIAGNOSIS — M2040 Other hammer toe(s) (acquired), unspecified foot: Secondary | ICD-10-CM | POA: Diagnosis not present

## 2015-03-12 MED ORDER — NONFORMULARY OR COMPOUNDED ITEM: Status: DC

## 2015-03-12 MED ORDER — TRIAMCINOLONE ACETONIDE 10 MG/ML IJ SUSP
10.0000 mg | Freq: Once | INTRAMUSCULAR | Status: AC
  Administered 2015-03-12: 10 mg

## 2015-03-12 NOTE — Progress Notes (Signed)
   Subjective:    Patient ID: Felicia Hunter, female    DOB: Jun 28, 1940, 74 y.o.   MRN: [id number]  HPI Patient presents with pain on the dorsal area of the right foot, with swelling lasting several years, she also has bilateral hammertoe defromities that cause her pain on the left foot   Review of Systems     Objective:   Physical Exam        Assessment & Plan:

## 2015-03-12 NOTE — Telephone Encounter (Signed)
Dr. Paulla Dolly ordered Blanket Antiinflammatory Cream, faxed orders.

## 2015-03-13 NOTE — Progress Notes (Signed)
Subjective:     Patient ID: Felicia Hunter, female   DOB: 1940-05-22, 74 y.o.   MRN: [id number]  HPI patient presents stating I have had pain on top of my right foot for years and I've had digital deformities on my left over right foot that at times give me trouble. [place] I would like some relief   Review of Systems  All other systems reviewed and are negative.      Objective:   Physical Exam  Constitutional: She is oriented to person, place, and time.  Cardiovascular: Intact distal pulses.   Musculoskeletal: Normal range of motion.  Neurological: She is oriented to person, place, and time.  Skin: Skin is warm.  Nursing note reviewed.  neurovascular status found to be intact with muscle strength adequate and range of motion of the subtalar midtarsal joint within normal limits. Patient's found to have inflammation in the dorsum of the right foot with pain in the extensor complex midfoot and also digital deformities of the second toe left over right. Good digital perfusion and well oriented 3     Assessment:     Tendinitis of the dorsal right foot with inflammation fluid buildup and chronic arthritis and hammertoe deformity    Plan:     H&P and conditions reviewed with patient. Today I went ahead and I injected the dorsal tendon complex right 3 mg Kenalog 5 mg Xylocaine and we will begin topical cream treatment for anti-inflammatory relief. I then went ahead and discussed digital correction which may be necessary at one point in the future

## 2015-03-20 ENCOUNTER — Telehealth: Payer: Self-pay | Admitting: Internal Medicine

## 2015-03-20 ENCOUNTER — Telehealth: Payer: Self-pay | Admitting: *Deleted

## 2015-03-20 NOTE — Telephone Encounter (Signed)
-----   Message from [facility], [place] sent at 02/28/2015  8:22 AM EST ----- Please let her know her blood counts look great, no change to her medicine.  Thanks, [employer]

## 2015-03-20 NOTE — Telephone Encounter (Signed)
New message     Patient returning call back to nurse for test results

## 2015-03-21 ENCOUNTER — Encounter (HOSPITAL_COMMUNITY): Payer: Self-pay

## 2015-03-21 ENCOUNTER — Emergency Department (HOSPITAL_COMMUNITY)
Admission: EM | Admit: 2015-03-21 | Discharge: 2015-03-21 | Disposition: A | Discharge status: Home / Self Care | Payer: Medicare Other | Attending: Emergency Medicine | Admitting: Emergency Medicine

## 2015-03-21 ENCOUNTER — Emergency Department (HOSPITAL_COMMUNITY): Payer: Medicare Other

## 2015-03-21 DIAGNOSIS — I480 Paroxysmal atrial fibrillation: Secondary | ICD-10-CM | POA: Insufficient documentation

## 2015-03-21 DIAGNOSIS — Y9302 Activity, running: Secondary | ICD-10-CM | POA: Insufficient documentation

## 2015-03-21 DIAGNOSIS — S022XXB Fracture of nasal bones, initial encounter for open fracture: Secondary | ICD-10-CM | POA: Diagnosis not present

## 2015-03-21 DIAGNOSIS — Z872 Personal history of diseases of the skin and subcutaneous tissue: Secondary | ICD-10-CM | POA: Insufficient documentation

## 2015-03-21 DIAGNOSIS — Z853 Personal history of malignant neoplasm of breast: Secondary | ICD-10-CM | POA: Diagnosis not present

## 2015-03-21 DIAGNOSIS — Z7901 Long term (current) use of anticoagulants: Secondary | ICD-10-CM | POA: Diagnosis not present

## 2015-03-21 DIAGNOSIS — Y9289 Other specified places as the place of occurrence of the external cause: Secondary | ICD-10-CM | POA: Diagnosis not present

## 2015-03-21 DIAGNOSIS — I341 Nonrheumatic mitral (valve) prolapse: Secondary | ICD-10-CM | POA: Diagnosis not present

## 2015-03-21 DIAGNOSIS — Z79899 Other long term (current) drug therapy: Secondary | ICD-10-CM | POA: Diagnosis not present

## 2015-03-21 DIAGNOSIS — Y998 Other external cause status: Secondary | ICD-10-CM | POA: Diagnosis not present

## 2015-03-21 DIAGNOSIS — S0993XA Unspecified injury of face, initial encounter: Secondary | ICD-10-CM | POA: Diagnosis present

## 2015-03-21 DIAGNOSIS — S0181XA Laceration without foreign body of other part of head, initial encounter: Secondary | ICD-10-CM

## 2015-03-21 DIAGNOSIS — E785 Hyperlipidemia, unspecified: Secondary | ICD-10-CM | POA: Diagnosis not present

## 2015-03-21 DIAGNOSIS — W2209XA Striking against other stationary object, initial encounter: Secondary | ICD-10-CM | POA: Insufficient documentation

## 2015-03-21 DIAGNOSIS — S022XXA Fracture of nasal bones, initial encounter for closed fracture: Secondary | ICD-10-CM

## 2015-03-21 DIAGNOSIS — Z8673 Personal history of transient ischemic attack (TIA), and cerebral infarction without residual deficits: Secondary | ICD-10-CM | POA: Diagnosis not present

## 2015-03-21 DIAGNOSIS — Z8781 Personal history of (healed) traumatic fracture: Secondary | ICD-10-CM | POA: Diagnosis not present

## 2015-03-21 DIAGNOSIS — S0121XA Laceration without foreign body of nose, initial encounter: Secondary | ICD-10-CM | POA: Diagnosis not present

## 2015-03-21 DIAGNOSIS — M8580 Other specified disorders of bone density and structure, unspecified site: Secondary | ICD-10-CM | POA: Diagnosis not present

## 2015-03-21 DIAGNOSIS — K219 Gastro-esophageal reflux disease without esophagitis: Secondary | ICD-10-CM | POA: Insufficient documentation

## 2015-03-21 DIAGNOSIS — S0990XA Unspecified injury of head, initial encounter: Secondary | ICD-10-CM | POA: Diagnosis not present

## 2015-03-21 DIAGNOSIS — D68318 Other hemorrhagic disorder due to intrinsic circulating anticoagulants, antibodies, or inhibitors: Secondary | ICD-10-CM | POA: Diagnosis not present

## 2015-03-21 DIAGNOSIS — Z86010 Personal history of colonic polyps: Secondary | ICD-10-CM | POA: Insufficient documentation

## 2015-03-21 DIAGNOSIS — Z8669 Personal history of other diseases of the nervous system and sense organs: Secondary | ICD-10-CM | POA: Diagnosis not present

## 2015-03-21 MED ORDER — LIDOCAINE HCL 2 % IJ SOLN
20.0000 mL | Freq: Once | INTRAMUSCULAR | Status: AC
  Administered 2015-03-21: 400 mg
  Filled 2015-03-21: qty 20

## 2015-03-21 NOTE — ED Notes (Signed)
Pt ran into the wall this am chasing the cat hitting her nose, she has a small laceration on the bridge of her nose, bleeding controlled now. Pt currently on xarelto

## 2015-03-21 NOTE — ED Provider Notes (Signed)
CSN: [id number]     Arrival date & time 03/21/15  [id number] History   First MD Initiated Contact with Patient 03/21/15 [phone]     Chief Complaint  Patient presents with  . Facial Injury     (Consider location/radiation/quality/duration/timing/severity/associated sxs/prior Treatment) HPI 75 year old female who presents with facial injury. History of cryptogenic stroke with paroxysmal atrial fibrillation on Xarelto. Has been in her usual state of health. This morning while in bed, she heard her cat choking on a hairball. She tried to help her cat, and in the process of running and ran straight into the wall. This states that she felt that she hit her head very hard against the wall as as she saw stars. He did not have loss of consciousness, but noted significant bleeding from her nose with deformity. Did not fall back or fall to the ground. Denies any other injuries. Reports up to date tetanus   Past Medical History  Diagnosis Date  . GERD (gastroesophageal reflux disease)   . Arthritis   . Cough     PERSISTANT COUGH X 20 YRS  . Tubulovillous adenoma of colon 08/2003  . Diverticulosis   . Hyperlipidemia   . Osteopenia   . Alopecia   . MVP (mitral valve prolapse)   . Clavicle fracture     right clavicle-hardware retained  . Disorders of bilirubin excretion   . Difficulty sleeping     takes Ambien  . Breast cancer ([place])   . Stroke ([place]) 06/07/11    x2 episodes-"funny feeling"" equilibrium off x24 hours, x1 speech affected- no residuallast 1 yr ago.  . Environmental and seasonal allergies     "dry cough", related to ? allergies   Past Surgical History  Procedure Laterality Date  . Collarbone  september 2011    pinned and plated, right  . Abdominal hysterectomy  1993  . Vein surgery  1974    left leg  . Nasal septum surgery    . Tonsillectomy    . Joint replacement  july 9th, 2012    left knee replacement   . Knee reconstruction  7/12    revision lt total knee  . Breast enhancement  surgery      x3-then removed 2011 for bilat mastectomies  . Chest wall tumor excision  5/12    noduals removed-neg  . Breast capsulotomy      after bilat mastectomies-implants removed  . Breast surgery  june 2011    double mastectomy nodes from rt  . Mass excision Left 06/19/2012    Procedure: EXCISION OF LARGE MASS LEFT NECK/SHOULDER WITH PLASTIC CLOSURE;  Surgeon: Cristine Polio, MD;  Location: [place];  Service: Plastics;  Laterality: Left;  . Total hip arthroplasty Right 04/10/2013    Procedure: RIGHT TOTAL HIP ARTHROPLASTY ANTERIOR APPROACH;  Surgeon: [name], MD;  Location: [facility];  Service: Orthopedics;  Laterality: Right;  . Tee without cardioversion N/A 11/07/2013    Procedure: TRANSESOPHAGEAL ECHOCARDIOGRAM (TEE);  Surgeon: Dorothy Spark, MD;  Location: [place];  Service: Cardiovascular;  Laterality: N/A;  . Loop recorder implant N/A 11/07/2013    Procedure: LOOP RECORDER IMPLANT;  Surgeon: Evans Lance, MD;  Location: [facility] CATH LAB;  Service: Cardiovascular;  Laterality: N/A;  . Total hip arthroplasty Left 12/31/2014    Procedure: LEFT TOTAL HIP ARTHROPLASTY ANTERIOR APPROACH;  Surgeon: Paralee Cancel, MD;  Location: [facility];  Service: Orthopedics;  Laterality: Left;   Family History  Problem Relation Age  of Onset  . Aneurysm Father   . Colon cancer Neg Hx    Social History  Substance Use Topics  . Smoking status: Never Smoker   . Smokeless tobacco: Never Used  . Alcohol Use: 0.6 oz/week    1 Glasses of wine per week     Comment: 1 glass of wine per night   OB History    No data available     Review of Systems  Constitutional: Negative for fever.  Respiratory: Negative for shortness of breath.   Cardiovascular: Negative for chest pain.  Gastrointestinal: Negative for abdominal pain.  Neurological: Negative for headaches.  Hematological: Bruises/bleeds easily.  All other systems reviewed and are negative.     Allergies   Sulfonamide derivatives  Home Medications   Prior to Admission medications   Medication Sig Start Date End Date Taking? Authorizing Provider  atorvastatin (LIPITOR) 20 MG tablet Take 20 mg by mouth every evening.  06/07/12  Yes Historical Provider, MD  BIOTIN PO Take 1 tablet by mouth daily.   Yes Historical Provider, MD  calcium carbonate (CALCIUM 600) 600 MG TABS Take 600 mg by mouth daily.    Yes Historical Provider, MD  cholecalciferol (VITAMIN D) 1000 UNITS tablet Take 1,000 Units by mouth daily.   Yes Historical Provider, MD  glucosamine-chondroitin 500-400 MG tablet Take 1 tablet by mouth daily.    Yes Historical Provider, MD  Multiple Vitamins-Minerals (MULTIVITAMIN & MINERAL PO) Take by mouth every morning.   Yes Historical Provider, MD  omega-3 acid ethyl esters (LOVAZA) 1 G capsule Take 1 g by mouth 2 (two) times daily.    Yes Historical Provider, MD  polyvinyl alcohol (LIQUIFILM TEARS) 1.4 % ophthalmic solution Place 1 drop into both eyes daily.   Yes Historical Provider, MD  rivaroxaban (XARELTO) 20 MG TABS tablet Take 1 tablet (20 mg total) by mouth daily with supper. 02/20/15  Yes Evans Lance, MD  vitamin C (ASCORBIC ACID) 500 MG tablet Take 500 mg by mouth daily.    Yes Historical Provider, MD  NONFORMULARY OR COMPOUNDED [place] compound:  Antiinflammatory cream - Diclofenac 3%, Baclofen 2%, Cyclobenzaprine 2%, Lidocaine 2% dispense 180 grams, apply 1-2 grams to affected area 3-4 times daily, +3refills. 03/12/15   Tamala Fothergill Regal, DPM   BP 164/94 mmHg  Pulse 68  Temp(Src) 97.4 F (36.3 C) (Oral)  Resp 20  SpO2 96% Physical Exam Physical Exam  Nursing note and vitals reviewed. Constitutional: Well developed, well nourished, non-toxic, and in no acute distress Head: Normocephalic. 1 cm deep vertical incision over the bridge of the nose Mouth/Throat: Oropharynx is clear and moist.  Neck: Normal range of motion. Neck supple.  Cardiovascular: Normal rate and  regular rhythm.   Pulmonary/Chest: Effort normal and breath sounds normal. No chest wall tenderness Abdominal: Soft. There is no tenderness. There is no rebound and no guarding.  Musculoskeletal: Normal range of motion.  Neurological: Alert, no facial droop, fluent speech, moves all extremities symmetrically Skin: Skin is warm and dry.  Psychiatric: Cooperative  ED Course  Procedures (including critical care time) Labs Review Labs Reviewed - No data to display  Imaging Review Ct Head Wo Contrast  03/21/2015  CLINICAL DATA:  Head injury.  On blood thinner. EXAM: CT HEAD WITHOUT CONTRAST CT MAXILLOFACIAL WITHOUT CONTRAST TECHNIQUE: Multidetector CT imaging of the head and maxillofacial structures were performed using the standard protocol without intravenous contrast. Multiplanar CT image reconstructions of the maxillofacial structures were also generated. COMPARISON:  CT head 11/05/2013 FINDINGS: CT HEAD FINDINGS Chronic cortical infarct right parietal lobe unchanged. Chronic infarct left posterior frontal cortex unchanged. Chronic left cerebellar infarct unchanged. Negative for acute infarct. Negative for intracranial hemorrhage. No mass or edema. Negative for skull fracture. CT MAXILLOFACIAL FINDINGS Nasal bone fracture, displaced to the left. Fracture of the nasal septum with mild displacement. Negative for orbital fracture. Negative for fracture of the mandible. No other facial fracture identified. Moderate degenerative change in the TMJ bilaterally. Mild mucosal edema in the ethmoid sinuses bilaterally. No air-fluid level in the sinuses. Mild mucosal edema in the maxillary sinus bilaterally. Mastoid sinus clear bilaterally. Soft tissue swelling left nasal cavity may represent a septal hematoma. Direct visualization of this area is recommended. IMPRESSION: Chronic infarcts bilaterally are stable and most likely due to prior emboli. No acute infarct. Negative for intracranial hemorrhage Nasal bone  fracture which is depressed and displaced to the left. There is a probable nasal septal hematoma on the left. Direct visualization of this area recommended. There is a fracture the nasal septum. No other facial fracture. Electronically Signed   By: Franchot Gallo M.D.   On: 03/21/2015 08:13   Ct Maxillofacial Wo Cm  03/21/2015  CLINICAL DATA:  Head injury.  On blood thinner. EXAM: CT HEAD WITHOUT CONTRAST CT MAXILLOFACIAL WITHOUT CONTRAST TECHNIQUE: Multidetector CT imaging of the head and maxillofacial structures were performed using the standard protocol without intravenous contrast. Multiplanar CT image reconstructions of the maxillofacial structures were also generated. COMPARISON:  CT head 11/05/2013 FINDINGS: CT HEAD FINDINGS Chronic cortical infarct right parietal lobe unchanged. Chronic infarct left posterior frontal cortex unchanged. Chronic left cerebellar infarct unchanged. Negative for acute infarct. Negative for intracranial hemorrhage. No mass or edema. Negative for skull fracture. CT MAXILLOFACIAL FINDINGS Nasal bone fracture, displaced to the left. Fracture of the nasal septum with mild displacement. Negative for orbital fracture. Negative for fracture of the mandible. No other facial fracture identified. Moderate degenerative change in the TMJ bilaterally. Mild mucosal edema in the ethmoid sinuses bilaterally. No air-fluid level in the sinuses. Mild mucosal edema in the maxillary sinus bilaterally. Mastoid sinus clear bilaterally. Soft tissue swelling left nasal cavity may represent a septal hematoma. Direct visualization of this area is recommended. IMPRESSION: Chronic infarcts bilaterally are stable and most likely due to prior emboli. No acute infarct. Negative for intracranial hemorrhage Nasal bone fracture which is depressed and displaced to the left. There is a probable nasal septal hematoma on the left. Direct visualization of this area recommended. There is a fracture the nasal septum. No  other facial fracture. Electronically Signed   By: Franchot Gallo M.D.   On: 03/21/2015 08:13   I have personally reviewed and evaluated these images and lab results as part of my medical decision-making.   MDM   Final diagnoses:  Facial laceration, initial encounter  On anticoagulant therapy  Nasal bone fracture, closed, initial encounter   75 year old female with history of paroxysmal atrial fibrillation on Xarelto who presents with head and face injury. CT head shows no acute intracranial bleeding or trauma. CT of her face shows evidence of nasal bone fracture I do not appreciate any septal hematoma on direct inspection of her nasal canals. 1 cm laceration to bridge of nose repaired. She is given referral to see ENT, and told to call today for close follow-up. Discussed warning signs for infection and for signs of hematoma development. Strict return and follow-up instructions reviewed. She expressed understanding of all discharge instructions and felt  comfortable with the plan of care.     Forde Dandy, MD 03/21/15 [phone]

## 2015-03-21 NOTE — Discharge Instructions (Signed)
Please have sutures removed in about 5 days. Watch out for signs of infection, including fever, increased redness/swelling, pus drainage. Return without fail for concern of infection or worsening symptoms, including difficulty breathing, worsening pain, confusion, or any other symptoms concerning to you. Please call ENT doctor above today to set up close follow-up regarding your broken nose.  Laceration Care, Adult A laceration is a cut that goes through all layers of the skin. The cut also goes into the tissue that is right under the skin. Some cuts heal on their own. Others need to be closed with stitches (sutures), staples, skin adhesive strips, or wound glue. Taking care of your cut lowers your risk of infection and helps your cut to heal better. HOW TO TAKE CARE OF YOUR CUT For stitches or staples:  Keep the wound clean and dry.  If you were given a bandage (dressing), you should change it at least one time per day or as told by your doctor. You should also change it if it gets wet or dirty.  Keep the wound completely dry for the first 24 hours or as told by your doctor. After that time, you may take a shower or a bath. However, make sure that the wound is not soaked in water until after the stitches or staples have been removed.  Clean the wound one time each day or as told by your doctor:  Wash the wound with soap and water.  Rinse the wound with water until all of the soap comes off.  Pat the wound dry with a clean towel. Do not rub the wound.  After you clean the wound, put a thin layer of antibiotic ointment on it as told by your doctor. This ointment:  Helps to prevent infection.  Keeps the bandage from sticking to the wound.  Have your stitches or staples removed as told by your doctor. If your doctor used skin adhesive strips:   Keep the wound clean and dry.  If you were given a bandage, you should change it at least one time per day or as told by your doctor. You should  also change it if it gets dirty or wet.  Do not get the skin adhesive strips wet. You can take a shower or a bath, but be careful to keep the wound dry.  If the wound gets wet, pat it dry with a clean towel. Do not rub the wound.  Skin adhesive strips fall off on their own. You can trim the strips as the wound heals. Do not remove any strips that are still stuck to the wound. They will fall off after a while. If your doctor used wound glue:  Try to keep your wound dry, but you may briefly wet it in the shower or bath. Do not soak the wound in water, such as by swimming.  After you take a shower or a bath, gently pat the wound dry with a clean towel. Do not rub the wound.  Do not do any activities that will make you really sweaty until the skin glue has fallen off on its own.  Do not apply liquid, cream, or ointment medicine to your wound while the skin glue is still on.  If you were given a bandage, you should change it at least one time per day or as told by your doctor. You should also change it if it gets dirty or wet.  If a bandage is placed over the wound, do not let the  tape for the bandage touch the skin glue.  Do not pick at the glue. The skin glue usually stays on for 5-10 days. Then, it falls off of the skin. General Instructions  To help prevent scarring, make sure to cover your wound with sunscreen whenever you are outside after stitches are removed, after adhesive strips are removed, or when wound glue stays in place and the wound is healed. Make sure to wear a sunscreen of at least 30 SPF.  Take over-the-counter and prescription medicines only as told by your doctor.  If you were given antibiotic medicine or ointment, take or apply it as told by your doctor. Do not stop using the antibiotic even if your wound is getting better.  Do not scratch or pick at the wound.  Keep all follow-up visits as told by your doctor. This is important.  Check your wound every day for  signs of infection. Watch for:  Redness, swelling, or pain.  Fluid, blood, or pus.  Raise (elevate) the injured area above the level of your heart while you are sitting or lying down, if possible. GET HELP IF:  You got a tetanus shot and you have any of these problems at the injection site:  Swelling.  Very bad pain.  Redness.  Bleeding.  You have a fever.  A wound that was closed breaks open.  You notice a bad smell coming from your wound or your bandage.  You notice something coming out of the wound, such as wood or glass.  Medicine does not help your pain.  You have more redness, swelling, or pain at the site of your wound.  You have fluid, blood, or pus coming from your wound.  You notice a change in the color of your skin near your wound.  You need to change the bandage often because fluid, blood, or pus is coming from the wound.  You start to have a new rash.  You start to have numbness around the wound. GET HELP RIGHT AWAY IF:  You have very bad swelling around the wound.  Your pain suddenly gets worse and is very bad.  You notice painful lumps near the wound or on skin that is anywhere on your body.  You have a red streak going away from your wound.  The wound is on your hand or foot and you cannot move a finger or toe like you usually can.  The wound is on your hand or foot and you notice that your fingers or toes look pale or bluish.   This information is not intended to replace advice given to you by your health care provider. Make sure you discuss any questions you have with your health care provider.   Document Released: 08/18/2007 Document Revised: 07/16/2014 Document Reviewed: 02/25/2014 Elsevier Interactive Patient Education 2016 [employer]  Nasal Fracture A nasal fracture is a break or crack in the bones or cartilage of the nose. Minor breaks do not require treatment. These breaks usually heal on their own after about one month. Serious  breaks may require surgery. CAUSES This injury is usually caused by a blunt injury to the nose. This type of injury often occurs from:  Contact sports.  Car accidents.  Falls.  Getting punched. SYMPTOMS Symptoms of this injury include:  Pain.  Swelling of the nose.  Bleeding from the nose.  Bruising around the nose or eyes. This may include having black eyes.  Crooked appearance of the nose. DIAGNOSIS This injury may be  diagnosed with a physical exam. The health care provider will gently feel the nose for signs of broken bones. He or she will look inside the nostrils to make sure that there is not a blood-filled swelling on the dividing wall between the nostrils (septal hematoma). X-rays of the nose may not show a nasal fracture even when one is present. In some cases, X-rays or a CT scan may be done 1-5 days after the injury. Sometimes, the health care provider will want to wait until the swelling has gone down. TREATMENT Often, minor fractures that have caused no deformity do not require treatment. More serious fractures in which bones have moved out of position may require surgery, which will take place after the swelling is gone. Surgery will stabilize and align the fracture. In some cases, a health care provider may be able to reposition the bones without surgery. This may be done in the health care provider's office after medicine is given to numb the area (local anesthetic). HOME CARE INSTRUCTIONS  If directed, apply ice to the injured area:  Put ice in a plastic bag.  Place a towel between your skin and the bag.  Leave the ice on for 20 minutes, 2-3 times per day.  Take over-the-counter and prescription medicines only as told by your health care provider.  If your nose starts to bleed, sit in an upright position while you squeeze the soft parts of your nose against the dividing wall between your nostrils (septum) for 10 minutes.  Try to avoid blowing your  nose.  Return to your normal activities as told by your health care provider. Ask your health care provider what activities are safe for you.  Avoid contact sports for 3-4 weeks or as told by your health care provider.  Keep all follow-up visits as told by your health care provider. This is important. SEEK MEDICAL CARE IF:  Your pain increases or becomes severe.  You continue to have nosebleeds.  The shape of your nose does not return to normal within 5 days.  You have pus draining out of your nose. SEEK IMMEDIATE MEDICAL CARE IF:  You have bleeding from your nose that does not stop after you pinch your nostrils closed for 20 minutes and keep ice on your nose.  You have clear fluid draining out of your nose.  You notice a grape-like swelling on the septum. This swelling is a collection of blood (hematoma) that must be drained to help prevent infection.  You have difficulty moving your eyes.  You have repeated vomiting.   This information is not intended to replace advice given to you by your health care provider. Make sure you discuss any questions you have with your health care provider.   Document Released: 02/27/2000 Document Revised: 11/20/2014 Document Reviewed: 04/08/2014 Elsevier Interactive Patient Education [employer].

## 2015-03-26 DIAGNOSIS — S022XXD Fracture of nasal bones, subsequent encounter for fracture with routine healing: Secondary | ICD-10-CM | POA: Diagnosis not present

## 2015-03-26 DIAGNOSIS — J3489 Other specified disorders of nose and nasal sinuses: Secondary | ICD-10-CM | POA: Diagnosis not present

## 2015-04-01 ENCOUNTER — Other Ambulatory Visit: Payer: Self-pay

## 2015-04-01 ENCOUNTER — Encounter: Payer: Self-pay | Admitting: Internal Medicine

## 2015-04-01 ENCOUNTER — Ambulatory Visit (INDEPENDENT_AMBULATORY_CARE_PROVIDER_SITE_OTHER): Payer: Medicare Other | Admitting: *Deleted

## 2015-04-01 ENCOUNTER — Other Ambulatory Visit (INDEPENDENT_AMBULATORY_CARE_PROVIDER_SITE_OTHER): Payer: Medicare Other | Admitting: *Deleted

## 2015-04-01 ENCOUNTER — Telehealth: Payer: Self-pay

## 2015-04-01 ENCOUNTER — Ambulatory Visit (INDEPENDENT_AMBULATORY_CARE_PROVIDER_SITE_OTHER): Payer: Medicare Other | Admitting: Internal Medicine

## 2015-04-01 VITALS — BP 140/88 | HR 80 | Ht 65.5 in | Wt 140.0 lb

## 2015-04-01 DIAGNOSIS — R55 Syncope and collapse: Secondary | ICD-10-CM | POA: Diagnosis not present

## 2015-04-01 DIAGNOSIS — Z8673 Personal history of transient ischemic attack (TIA), and cerebral infarction without residual deficits: Secondary | ICD-10-CM | POA: Diagnosis not present

## 2015-04-01 DIAGNOSIS — I480 Paroxysmal atrial fibrillation: Secondary | ICD-10-CM | POA: Diagnosis not present

## 2015-04-01 DIAGNOSIS — I4891 Unspecified atrial fibrillation: Secondary | ICD-10-CM | POA: Diagnosis not present

## 2015-04-01 DIAGNOSIS — I639 Cerebral infarction, unspecified: Secondary | ICD-10-CM | POA: Diagnosis not present

## 2015-04-01 LAB — CUP PACEART INCLINIC DEVICE CHECK: Date Time Interrogation Session: 20170117133819

## 2015-04-01 LAB — BASIC METABOLIC PANEL
BUN: 14 mg/dL (ref 7–25)
CALCIUM: 9.6 mg/dL (ref 8.6–10.4)
CO2: 24 mmol/L (ref 20–31)
Chloride: 104 mmol/L (ref 98–110)
Creat: 0.82 mg/dL (ref 0.60–0.93)
Glucose, Bld: 65 mg/dL (ref 65–99)
POTASSIUM: 4.2 mmol/L (ref 3.5–5.3)
SODIUM: 141 mmol/L (ref 135–146)

## 2015-04-01 NOTE — Telephone Encounter (Signed)
Called and left VM to return call to [phone]. For follow up phone call.

## 2015-04-01 NOTE — Progress Notes (Signed)
HPI  [name] returns today for followup. She has a h/o cryptogenic stroke, s/p ILR insertion. In the interim, she has had hip replacement surgery. She recently ran into the wall and broke her nose. No palpitations.  Allergies  Allergen Reactions  . Sulfonamide Derivatives Rash     Current Outpatient Prescriptions  Medication Sig Dispense Refill  . atorvastatin (LIPITOR) 20 MG tablet Take 20 mg by mouth every evening.     Marland Kitchen BIOTIN PO Take 1 tablet by mouth daily.    . calcium carbonate (CALCIUM 600) 600 MG TABS Take 600 mg by mouth daily.     . cholecalciferol (VITAMIN D) 1000 UNITS tablet Take 1,000 Units by mouth daily.    Marland Kitchen glucosamine-chondroitin 500-400 MG tablet Take 1 tablet by mouth daily.     . Multiple Vitamins-Minerals (MULTIVITAMIN & MINERAL PO) Take by mouth every morning.    . NONFORMULARY OR COMPOUNDED [place] compound:  Antiinflammatory cream - Diclofenac 3%, Baclofen 2%, Cyclobenzaprine 2%, Lidocaine 2% dispense 180 grams, apply 1-2 grams to affected area 3-4 times daily, +3refills. 180 each 3  . omega-3 acid ethyl esters (LOVAZA) 1 G capsule Take 1 g by mouth 2 (two) times daily.     . polyvinyl alcohol (LIQUIFILM TEARS) 1.4 % ophthalmic solution Place 1 drop into both eyes daily.    . rivaroxaban (XARELTO) 20 MG TABS tablet Take 1 tablet (20 mg total) by mouth daily with supper. 30 tablet 11  . vitamin C (ASCORBIC ACID) 500 MG tablet Take 500 mg by mouth daily as needed (Cold symptoms).      No current facility-administered medications for this visit.     Past Medical History  Diagnosis Date  . GERD (gastroesophageal reflux disease)   . Arthritis   . Cough     PERSISTANT COUGH X 20 YRS  . Tubulovillous adenoma of colon 08/2003  . Diverticulosis   . Hyperlipidemia   . Osteopenia   . Alopecia   . MVP (mitral valve prolapse)   . Clavicle fracture     right clavicle-hardware retained  . Disorders of bilirubin excretion   . Difficulty  sleeping     takes Ambien  . Breast cancer ([place])   . Stroke ([place]) 06/07/11    x2 episodes-"funny feeling"" equilibrium off x24 hours, x1 speech affected- no residuallast 1 yr ago.  . Environmental and seasonal allergies     "dry cough", related to ? allergies    ROS:   All systems reviewed and negative except as noted in the HPI.   Past Surgical History  Procedure Laterality Date  . Collarbone  september 2011    pinned and plated, right  . Abdominal hysterectomy  1993  . Vein surgery  1974    left leg  . Nasal septum surgery    . Tonsillectomy    . Joint replacement  july 9th, 2012    left knee replacement   . Knee reconstruction  7/12    revision lt total knee  . Breast enhancement surgery      x3-then removed 2011 for bilat mastectomies  . Chest wall tumor excision  5/12    noduals removed-neg  . Breast capsulotomy      after bilat mastectomies-implants removed  . Breast surgery  june 2011    double mastectomy nodes from rt  . Mass excision Left 06/19/2012    Procedure: EXCISION OF LARGE MASS LEFT NECK/SHOULDER WITH PLASTIC CLOSURE;  Surgeon: Berneta Sages  Towanda Malkin, MD;  Location: [facility];  Service: Plastics;  Laterality: Left;  . Total hip arthroplasty Right 04/10/2013    Procedure: RIGHT TOTAL HIP ARTHROPLASTY ANTERIOR APPROACH;  Surgeon: [name], MD;  Location: [facility];  Service: Orthopedics;  Laterality: Right;  . Tee without cardioversion N/A 11/07/2013    Procedure: TRANSESOPHAGEAL ECHOCARDIOGRAM (TEE);  Surgeon: Dorothy Spark, MD;  Location: [facility];  Service: Cardiovascular;  Laterality: N/A;  . Loop recorder implant N/A 11/07/2013    Procedure: LOOP RECORDER IMPLANT;  Surgeon: Evans Lance, MD;  Location[facility];  Service: Cardiovascular;  Laterality: N/A;  . Total hip arthroplasty Left 12/31/2014    Procedure: LEFT TOTAL HIP ARTHROPLASTY ANTERIOR APPROACH;  Surgeon: Paralee Cancel, MD;  Location: [facility];  Service: Orthopedics;   Laterality: Left;     Family History  Problem Relation Age of Onset  . Aneurysm Father   . Colon cancer Neg Hx      Social History   Social History  . Marital Status: Widowed    Spouse Name: N/A  . Number of Children: 2  . Years of Education: college   Occupational History  . retired   .     Social History Main Topics  . Smoking status: Never Smoker   . Smokeless tobacco: Never Used  . Alcohol Use: 0.6 oz/week    1 Glasses of wine per week     Comment: 1 glass of wine per night  . Drug Use: No  . Sexual Activity: Not on file   Other Topics Concern  . Not on file   Social History Narrative   Patient is single with 2 children.    Patient is left handed.   Patient has college education.   Patient drinks coffee daily     BP 140/88 mmHg  Pulse 80  Ht 5' 5.5" (1.664 m)  Wt 140 lb (63.504 kg)  BMI 22.93 kg/m2  Physical Exam:  Well appearing 75 yo woman, NAD HEENT: Unremarkable except for resolving orbital ecchymosis Neck:  6 cm JVD, no thyromegally Lymphatics:  No adenopathy Back:  No CVA tenderness Lungs:  Clear with no wheezes HEART:  Regular rate rhythm, no murmurs, no rubs, no clicks Abd:  soft, positive bowel sounds, no organomegally, no rebound, no guarding Ext:  2 plus pulses, no edema, no cyanosis, no clubbing Skin:  No rashes no nodules Neuro:  CN II through XII intact, motor grossly intact   DEVICE  Normal device function.  See PaceArt for details. No atrial fib.  Assess/Plan:

## 2015-04-01 NOTE — Patient Instructions (Signed)

## 2015-04-01 NOTE — Assessment & Plan Note (Signed)
She has had no recurrent symptoms. She is tolerating her anti-coagulation well.

## 2015-04-01 NOTE — Assessment & Plan Note (Signed)
She has had no sustained arrhythmias on her ILR.

## 2015-04-02 DIAGNOSIS — Z96642 Presence of left artificial hip joint: Secondary | ICD-10-CM | POA: Diagnosis not present

## 2015-04-02 DIAGNOSIS — Z471 Aftercare following joint replacement surgery: Secondary | ICD-10-CM | POA: Diagnosis not present

## 2015-04-02 NOTE — Progress Notes (Signed)
Carelink Summary Report / Loop Recorder 

## 2015-04-09 ENCOUNTER — Telehealth: Payer: Self-pay | Admitting: Neurology

## 2015-04-09 NOTE — Telephone Encounter (Signed)
I left a message for Marco Wichman.  I asked if she could please give me a call back at [phone].

## 2015-04-15 ENCOUNTER — Ambulatory Visit: Payer: Medicare Other | Admitting: Physician Assistant

## 2015-04-18 LAB — CUP PACEART REMOTE DEVICE CHECK: MDC IDC SESS DTM: 20161218223621

## 2015-04-25 ENCOUNTER — Encounter: Payer: Self-pay | Admitting: Cardiology

## 2015-05-01 ENCOUNTER — Ambulatory Visit (INDEPENDENT_AMBULATORY_CARE_PROVIDER_SITE_OTHER): Payer: Medicare Other | Admitting: *Deleted

## 2015-05-01 DIAGNOSIS — I639 Cerebral infarction, unspecified: Secondary | ICD-10-CM | POA: Diagnosis not present

## 2015-05-02 NOTE — Progress Notes (Signed)
Carelink Summary Report / Loop Recorder 

## 2015-05-05 ENCOUNTER — Telehealth: Payer: Self-pay

## 2015-05-05 NOTE — Telephone Encounter (Signed)
Called and left VM to return call to research [phone].

## 2015-05-16 ENCOUNTER — Encounter: Payer: Self-pay | Admitting: Cardiology

## 2015-05-19 LAB — CUP PACEART REMOTE DEVICE CHECK: Date Time Interrogation Session: 20170117230549

## 2015-05-19 NOTE — Progress Notes (Signed)
Carelink summary report received. Battery status OK. Normal device function. No new symptom episodes, tachy episodes, brady, or pause episodes. No new AF episodes. Monthly summary reports and ROV/PRN 

## 2015-05-28 DIAGNOSIS — M25561 Pain in right knee: Secondary | ICD-10-CM | POA: Diagnosis not present

## 2015-05-28 DIAGNOSIS — M25511 Pain in right shoulder: Secondary | ICD-10-CM | POA: Diagnosis not present

## 2015-05-29 ENCOUNTER — Encounter: Payer: Self-pay | Admitting: Podiatry

## 2015-05-29 ENCOUNTER — Ambulatory Visit (INDEPENDENT_AMBULATORY_CARE_PROVIDER_SITE_OTHER): Payer: Medicare Other | Admitting: Podiatry

## 2015-05-29 VITALS — BP 120/74 | HR 75 | Resp 16

## 2015-05-29 DIAGNOSIS — M779 Enthesopathy, unspecified: Secondary | ICD-10-CM

## 2015-05-29 MED ORDER — TRIAMCINOLONE ACETONIDE 10 MG/ML IJ SUSP
10.0000 mg | Freq: Once | INTRAMUSCULAR | Status: AC
  Administered 2015-05-29: 10 mg

## 2015-06-02 ENCOUNTER — Ambulatory Visit (INDEPENDENT_AMBULATORY_CARE_PROVIDER_SITE_OTHER): Payer: Medicare Other | Admitting: *Deleted

## 2015-06-02 ENCOUNTER — Telehealth: Payer: Self-pay | Admitting: Internal Medicine

## 2015-06-02 DIAGNOSIS — I639 Cerebral infarction, unspecified: Secondary | ICD-10-CM | POA: Diagnosis not present

## 2015-06-02 DIAGNOSIS — M79671 Pain in right foot: Secondary | ICD-10-CM | POA: Diagnosis not present

## 2015-06-02 DIAGNOSIS — M25511 Pain in right shoulder: Secondary | ICD-10-CM | POA: Diagnosis not present

## 2015-06-02 DIAGNOSIS — R05 Cough: Secondary | ICD-10-CM | POA: Diagnosis not present

## 2015-06-02 DIAGNOSIS — Z7189 Other specified counseling: Secondary | ICD-10-CM | POA: Diagnosis not present

## 2015-06-02 NOTE — Telephone Encounter (Signed)
New message      Pt is calling to let us know she is going out of town for 10 days and will not be taking her box

## 2015-06-02 NOTE — Progress Notes (Signed)
Subjective:     Patient ID: Felicia Hunter, female   DOB: Nov 26, 1940, 75 y.o.   MRN: [id number]  HPI patient states she still having pain in her foot and [place]   Review of Systems     Objective:   Physical Exam Neurovascular status intact muscle strength adequate with continued discomfort dorsum right foot within the tendon complex.    Assessment:     Tendinitis dorsal right    Plan:     Injected the tendon complex 3 mg Kenalog 5 mg Xylocaine advised on physical therapy and topical medications and reappoint as needed

## 2015-06-02 NOTE — Telephone Encounter (Signed)
LMOM requesting call back.  Gave device clinic phone number. 

## 2015-06-03 NOTE — Progress Notes (Signed)
Carelink Summary Report / Loop Recorder 

## 2015-06-12 NOTE — Telephone Encounter (Signed)
Encounter closed

## 2015-06-16 ENCOUNTER — Encounter: Payer: Self-pay | Admitting: Internal Medicine

## 2015-06-18 ENCOUNTER — Telehealth: Payer: Self-pay | Admitting: *Deleted

## 2015-06-18 NOTE — Telephone Encounter (Signed)
[facility] requesting call back, gave device clinic phone number.  Will ask patient if symptomatic with tachy episode on 06/12/15 when she returns call.

## 2015-06-18 NOTE — Telephone Encounter (Signed)
Patient returned call.  She states that she was on a [employer] to the [place] at the time of this episode.  She states that she was doing some strenuous activities, such as deep-water snorkeling, but was asymptomatic.  Patient is appreciative of call and denies questions or concerns at this time.

## 2015-06-30 ENCOUNTER — Ambulatory Visit (INDEPENDENT_AMBULATORY_CARE_PROVIDER_SITE_OTHER): Payer: Medicare Other | Admitting: *Deleted

## 2015-06-30 DIAGNOSIS — I639 Cerebral infarction, unspecified: Secondary | ICD-10-CM | POA: Diagnosis not present

## 2015-07-01 NOTE — Progress Notes (Signed)
Carelink Summary Report / Loop Recorder 

## 2015-07-07 LAB — CUP PACEART REMOTE DEVICE CHECK: MDC IDC SESS DTM: 20170216233928

## 2015-07-07 NOTE — Progress Notes (Signed)
Carelink summary report received. Battery status OK. Normal device function. No new symptom episodes, tachy episodes, brady, or pause episodes. No new AF episodes. Good histograms. Monthly summary reports and ROV/PRN 

## 2015-07-30 ENCOUNTER — Ambulatory Visit (INDEPENDENT_AMBULATORY_CARE_PROVIDER_SITE_OTHER): Payer: Medicare Other | Admitting: *Deleted

## 2015-07-30 DIAGNOSIS — I639 Cerebral infarction, unspecified: Secondary | ICD-10-CM | POA: Diagnosis not present

## 2015-08-01 NOTE — Progress Notes (Signed)
Carelink Summary Report / Loop Recorder 

## 2015-08-09 LAB — CUP PACEART REMOTE DEVICE CHECK: MDC IDC SESS DTM: 20170318233837

## 2015-08-09 NOTE — Progress Notes (Signed)
Carelink summary report received. Battery status OK. Normal device function. No new symptom episodes, brady, or pause episodes. No new AF episodes. 1 tachy episode, AF with RVR, +Xarelto. Monthly summary reports and ROV/PRN

## 2015-08-11 LAB — CUP PACEART REMOTE DEVICE CHECK: Date Time Interrogation Session: 20170418000649

## 2015-08-11 NOTE — Progress Notes (Signed)
Carelink summary report received. Battery status OK. Normal device function. No new symptom episodes, brady, or pause episodes. No new AF episodes. 1 tachy episode - AF, +Xarelto. Monthly summary reports and ROV/PRN

## 2015-08-13 ENCOUNTER — Encounter: Payer: Self-pay | Admitting: Neurology

## 2015-08-13 DIAGNOSIS — I6310 Cerebral infarction due to embolism of unspecified precerebral artery: Secondary | ICD-10-CM

## 2015-08-13 NOTE — Progress Notes (Signed)
Pt came in today for [facility] follow up visit. She has been taken off the investigational meds due to afib RVR found on loop recording. She is on Xarelto now. She has no complains or side effects from the medication. She has no other complains too. Will cancel her appointment with me on 08/27/15. She continues to follow up with PCP.   Rosalin Hawking, MD PhD Stroke Neurology 08/13/2015 6:58 PM

## 2015-08-27 ENCOUNTER — Ambulatory Visit: Payer: Self-pay | Admitting: Neurology

## 2015-08-29 ENCOUNTER — Ambulatory Visit (INDEPENDENT_AMBULATORY_CARE_PROVIDER_SITE_OTHER): Payer: Medicare Other | Admitting: *Deleted

## 2015-08-29 DIAGNOSIS — I639 Cerebral infarction, unspecified: Secondary | ICD-10-CM | POA: Diagnosis not present

## 2015-09-01 NOTE — Progress Notes (Signed)
Carelink Summary Report / Loop Recorder 

## 2015-09-03 DIAGNOSIS — M810 Age-related osteoporosis without current pathological fracture: Secondary | ICD-10-CM | POA: Diagnosis not present

## 2015-09-08 LAB — CUP PACEART REMOTE DEVICE CHECK
Date Time Interrogation Session: 20170518003617
Date Time Interrogation Session: 20170617003655

## 2015-09-27 ENCOUNTER — Ambulatory Visit: Payer: Medicare Other

## 2015-09-29 ENCOUNTER — Ambulatory Visit (INDEPENDENT_AMBULATORY_CARE_PROVIDER_SITE_OTHER): Payer: Medicare Other | Admitting: *Deleted

## 2015-09-29 DIAGNOSIS — I639 Cerebral infarction, unspecified: Secondary | ICD-10-CM

## 2015-09-30 LAB — CUP PACEART REMOTE DEVICE CHECK
Date Time Interrogation Session: 20170717013554
Date Time Interrogation Session: 20170717013554

## 2015-09-30 NOTE — Progress Notes (Signed)
Carelink Summary Report / Loop Recorder 

## 2015-10-23 ENCOUNTER — Ambulatory Visit (HOSPITAL_BASED_OUTPATIENT_CLINIC_OR_DEPARTMENT_OTHER): Payer: Medicare Other | Admitting: Hematology & Oncology

## 2015-10-23 ENCOUNTER — Encounter: Payer: Self-pay | Admitting: Hematology & Oncology

## 2015-10-23 ENCOUNTER — Other Ambulatory Visit (HOSPITAL_BASED_OUTPATIENT_CLINIC_OR_DEPARTMENT_OTHER): Payer: Medicare Other

## 2015-10-23 VITALS — BP 126/74 | HR 67 | Temp 97.4°F | Resp 16 | Ht 65.5 in | Wt 144.0 lb

## 2015-10-23 DIAGNOSIS — Z8673 Personal history of transient ischemic attack (TIA), and cerebral infarction without residual deficits: Secondary | ICD-10-CM

## 2015-10-23 DIAGNOSIS — Z853 Personal history of malignant neoplasm of breast: Secondary | ICD-10-CM

## 2015-10-23 DIAGNOSIS — T386X5A Adverse effect of antigonadotrophins, antiestrogens, antiandrogens, not elsewhere classified, initial encounter: Secondary | ICD-10-CM

## 2015-10-23 DIAGNOSIS — Z86000 Personal history of in-situ neoplasm of breast: Secondary | ICD-10-CM | POA: Diagnosis not present

## 2015-10-23 DIAGNOSIS — E559 Vitamin D deficiency, unspecified: Secondary | ICD-10-CM

## 2015-10-23 DIAGNOSIS — D0511 Intraductal carcinoma in situ of right breast: Secondary | ICD-10-CM

## 2015-10-23 DIAGNOSIS — M818 Other osteoporosis without current pathological fracture: Secondary | ICD-10-CM

## 2015-10-23 LAB — COMPREHENSIVE METABOLIC PANEL (CC13)
ALBUMIN: 4.3 g/dL (ref 3.5–4.8)
ALT: 14 IU/L (ref 0–32)
AST (SGOT): 18 IU/L (ref 0–40)
Albumin/Globulin Ratio: 2 (ref 1.2–2.2)
Alkaline Phosphatase, S: 48 IU/L (ref 39–117)
BILIRUBIN TOTAL: 1.4 mg/dL — AB (ref 0.0–1.2)
BUN/Creatinine Ratio: 23 (ref 12–28)
BUN: 18 mg/dL (ref 8–27)
CO2: 24 mmol/L (ref 18–29)
Calcium, Ser: 9.5 mg/dL (ref 8.7–10.3)
Chloride, Ser: 104 mmol/L (ref 96–106)
Creatinine, Ser: 0.77 mg/dL (ref 0.57–1.00)
GFR calc Af Amer: 87 mL/min/{1.73_m2} (ref >59)
GFR, EST NON AFRICAN AMERICAN: 76 mL/min/{1.73_m2} (ref >59)
GLUCOSE: 129 mg/dL — AB (ref 65–99)
Globulin, Total: 2.1 g/dL (ref 1.5–4.5)
POTASSIUM: 3.8 mmol/L (ref 3.5–5.2)
SODIUM: 132 mmol/L — AB (ref 134–144)
TOTAL PROTEIN: 6.4 g/dL (ref 6.0–8.5)

## 2015-10-23 LAB — CBC WITH DIFFERENTIAL (CANCER CENTER ONLY)
BASO#: 0 10*3/uL (ref 0.0–0.2)
BASO%: 0.8 % (ref 0.0–2.0)
EOS%: 2.1 % (ref 0.0–7.0)
Eosinophils Absolute: 0.1 10*3/uL (ref 0.0–0.5)
HCT: 42.1 % (ref 34.8–46.6)
HEMOGLOBIN: 14 g/dL (ref 11.6–15.9)
LYMPH#: 1.7 10*3/uL (ref 0.9–3.3)
LYMPH%: 31.8 % (ref 14.0–48.0)
MCH: 31.9 pg (ref 26.0–34.0)
MCHC: 33.3 g/dL (ref 32.0–36.0)
MCV: 96 fL (ref 81–101)
MONO#: 0.4 10*3/uL (ref 0.1–0.9)
MONO%: 7.9 % (ref 0.0–13.0)
NEUT%: 57.4 % (ref 39.6–80.0)
NEUTROS ABS: 3.1 10*3/uL (ref 1.5–6.5)
PLATELETS: 214 10*3/uL (ref 145–400)
RBC: 4.39 10*6/uL (ref 3.70–5.32)
RDW: 12.4 % (ref 11.1–15.7)
WBC: 5.3 10*3/uL (ref 3.9–10.0)

## 2015-10-23 NOTE — Progress Notes (Signed)
Hematology and Oncology Follow Up Visit  Felicia Hunter [id number] 08/15/1940 75 y.o. 10/23/2015   Principle Diagnosis:   DCIS of the right breast  Cortical stroke of the left MCA-no residual deficit  Current Therapy:    Clinical trial for the CVA     Interim History:  Felicia Hunter is back for follow-up. She looks great. She told me all about her trip to the [place]. This was an excursion that she went on. She really had a good time.  She said it was physically demanding down at the [place] but she was able to do what she like. She really enjoyed the giant tortoisis and the various species of booby's!!!  She has had no problems from the past CVA. She is off the clinical trial. She no longer has to see the neurologist.   There's been no problems with cough or shortness of breath. She's had no change in bowel or bladder habits. She's had no rashes. She does have vitiligo.   Overall, her performance status is ECOG 1.    Medications:  Current Outpatient Prescriptions:  .  atorvastatin (LIPITOR) 20 MG tablet, Take 20 mg by mouth every evening. , Disp: , Rfl:  .  BIOTIN PO, Take 1 tablet by mouth daily., Disp: , Rfl:  .  calcium carbonate (CALCIUM 600) 600 MG TABS, Take 600 mg by mouth daily. , Disp: , Rfl:  .  cholecalciferol (VITAMIN D) 1000 UNITS tablet, Take 1,000 Units by mouth daily., Disp: , Rfl:  .  glucosamine-chondroitin 500-400 MG tablet, Take 1 tablet by mouth daily. , Disp: , Rfl:  .  omega-3 acid ethyl esters (LOVAZA) 1 G capsule, Take 1 g by mouth 2 (two) times daily. , Disp: , Rfl:  .  polyvinyl alcohol (LIQUIFILM TEARS) 1.4 % ophthalmic solution, Place 1 drop into both eyes daily., Disp: , Rfl:  .  vitamin C (ASCORBIC ACID) 500 MG tablet, Take 500 mg by mouth daily as needed (Cold symptoms). , Disp: , Rfl:  .  Multiple Vitamins-Minerals (MULTIVITAMIN & MINERAL PO), Take by mouth every morning., Disp: , Rfl:  .  rivaroxaban (XARELTO) 20 MG TABS tablet, Take  1 tablet (20 mg total) by mouth daily with supper., Disp: 30 tablet, Rfl: 11  Allergies:  Allergies  Allergen Reactions  . Sulfonamide Derivatives Rash    Past Medical History, Surgical history, Social history, and Family History were reviewed and updated.  Review of Systems: As above  Physical Exam:  height is 5' 5.5" (1.664 m) and weight is 144 lb (65.3 kg). Her oral temperature is 97.4 F (36.3 C). Her blood pressure is 126/74 and her pulse is 67. Her respiration is 16.   Wt Readings from Last 3 Encounters:  10/23/15 144 lb (65.3 kg)  04/01/15 140 lb (63.5 kg)  02/27/15 141 lb (64 kg)     Well-developed and well-nourished white female in no obvious distress. Head and neck exam shows no ocular or oral lesions. She has no palpable cervical or supraclavicular lymph nodes. Lungs are clear. Cardiac exam regular rate and rhythm with no murmurs, rubs or bruits. Chest wall exam shows bilateral mastectomies. She has no chest wall nodules. There is no erythema on the chest wall. There is no bilateral axillary adenopathy. Abdomen is soft. She has good bowel sounds. There is no fluid wave. There is no palpable liver or spleen tip. Back exam shows no kyphosis. There is no tenderness over the spine, ribs or hips. Extremities shows  no clubbing, cyanosis or edema. She has some age related osteoarthritic changes. She has good range of motion of her joints. She has good strength in her extremities. Skin exam shows no rashes, ecchymoses or petechia. Neurological exam shows no focal neurological deficits.  Lab Results  Component Value Date   WBC 5.3 10/23/2015   HGB 14.0 10/23/2015   HCT 42.1 10/23/2015   MCV 96 10/23/2015   PLT 214 10/23/2015     Chemistry      Component Value Date/Time   NA 141 04/01/2015 0948   K 4.2 04/01/2015 0948   CL 104 04/01/2015 0948   CO2 24 04/01/2015 0948   BUN 14 04/01/2015 0948   CREATININE 0.82 04/01/2015 0948      Component Value Date/Time   CALCIUM 9.6  04/01/2015 0948   ALKPHOS 42 10/24/2014 1448   AST 18 10/24/2014 1448   ALT 14 10/24/2014 1448   BILITOT 1.5 (H) 10/24/2014 1448         Impression and Plan: Felicia Hunter is 75 year old white female. She has a past history of ductal carcinoma in situ of the right breast. She is doing well. She was diagnosed 6 years ago. Thankfully, she was not on any type of anti-estrogen (i.e. tamoxifen) when she had this CVA.  I'm so glad that she had a great time in the [place]. She really enjoyed herself. She is grateful that she was able to go and enjoyed the [place].  From my point of view, I don't think we have to do anything different with Felicia Hunter.  We will see her back in one year. I am sure that her next trip will be just as fun.  Volanda Napoleon, MD 8/10/20173:16 PM

## 2015-10-24 LAB — VITAMIN D 25 HYDROXY (VIT D DEFICIENCY, FRACTURES): Vitamin D, 25-Hydroxy: 49.1 ng/mL (ref 30.0–100.0)

## 2015-10-28 ENCOUNTER — Ambulatory Visit (INDEPENDENT_AMBULATORY_CARE_PROVIDER_SITE_OTHER): Payer: Medicare Other | Admitting: *Deleted

## 2015-10-28 DIAGNOSIS — I639 Cerebral infarction, unspecified: Secondary | ICD-10-CM | POA: Diagnosis not present

## 2015-10-29 NOTE — Progress Notes (Signed)
Carelink Summary Report / Loop Recorder 

## 2015-11-07 ENCOUNTER — Telehealth: Payer: Self-pay | Admitting: Gastroenterology

## 2015-11-07 NOTE — Telephone Encounter (Signed)
Pt is currently on xarelto, and pt has seen some pink rectal bleeding for 2 weeks off/on.  She has appt with Dr Fuller Plan on 01/08/16.  She has been constipated lately and has had to strain to have a bowel movment.  Pt was due for Flex last year but she states she says she was told by "someone at our office"  that she could not have it because she is on blood thinner.  Pt was advised to keep her bowels soft, take 1 capful of miralax daily and increase her water/fiber intake.  She will be put on the wait list and she was advised to call if she develops any new symptoms or bleeding becomes heavy.  Also, she stated she has a follow up with her PCP coming up as well.  Forwarded to Dr Fuller Plan for further recommendations.

## 2015-11-07 NOTE — Telephone Encounter (Signed)
Please try to have her seen soon in our office by APP. Please contact us if bleeding worsens.

## 2015-11-07 NOTE — Telephone Encounter (Signed)
Appt with Amy 11/12/15, pt is aware

## 2015-11-12 ENCOUNTER — Telehealth: Payer: Self-pay | Admitting: *Deleted

## 2015-11-12 ENCOUNTER — Other Ambulatory Visit: Payer: Self-pay | Admitting: *Deleted

## 2015-11-12 ENCOUNTER — Ambulatory Visit (INDEPENDENT_AMBULATORY_CARE_PROVIDER_SITE_OTHER): Payer: Medicare Other | Admitting: Physician Assistant

## 2015-11-12 ENCOUNTER — Encounter: Payer: Self-pay | Admitting: Physician Assistant

## 2015-11-12 VITALS — BP 112/80 | HR 76 | Ht 65.0 in | Wt 143.5 lb

## 2015-11-12 DIAGNOSIS — Z7901 Long term (current) use of anticoagulants: Secondary | ICD-10-CM | POA: Diagnosis not present

## 2015-11-12 DIAGNOSIS — I6310 Cerebral infarction due to embolism of unspecified precerebral artery: Secondary | ICD-10-CM | POA: Diagnosis not present

## 2015-11-12 DIAGNOSIS — K625 Hemorrhage of anus and rectum: Secondary | ICD-10-CM | POA: Diagnosis not present

## 2015-11-12 DIAGNOSIS — D128 Benign neoplasm of rectum: Secondary | ICD-10-CM | POA: Diagnosis not present

## 2015-11-12 MED ORDER — NA SULFATE-K SULFATE-MG SULF 17.5-3.13-1.6 GM/177ML PO SOLN: 1.0000 | Freq: Once | ORAL | 0 refills | Status: AC

## 2015-11-12 NOTE — Patient Instructions (Signed)
We will call you when we get the response from Dr. Crissie Sickles with the Xarelto instructions.   You have been scheduled for a colonoscopy. Please follow written instructions given to you at your visit today.  Please pick up your prep supplies at the pharmacy within the next 1-3 days. If you use inhalers (even only as needed), please bring them with you on the day of your procedure. Your physician has requested that you go to www.startemmi.com and enter the access code given to you at your visit today. This web site gives a general overview about your procedure. However, you should still follow specific instructions given to you by our office regarding your preparation for the procedure.

## 2015-11-12 NOTE — Telephone Encounter (Signed)
11/12/2015   RE: Felicia Hunter DOB: 12-Mar-1941 MRN: [id number]   Dear Dr. Cristopher Peru,    We have scheduled the above patient for an endoscopic procedure. Our records show that she is on anticoagulation therapy.   Please advise as to how long the patient may come off her therapy of Xarelto prior to the procedure, which is scheduled for 12-10-2015.  Please fax back/ or route the completed form to [facility] at [phone].   Sincerely,    Amy Esterwood PA-C

## 2015-11-12 NOTE — Progress Notes (Signed)
Reviewed and agree with management plan.  Timon Geissinger T. Gillis Boardley, MD FACG 

## 2015-11-12 NOTE — Telephone Encounter (Signed)
  11/12/2015   RE: Felicia Hunter DOB: [birth year] MRN: [id number]   Dear Dr. Cristopher Peru,    We have scheduled the above patient for an endoscopic procedure. Our records show that she is on anticoagulation therapy.   Please advise as to how long the patient may come off her therapy of Xarelto prior to the procedure, which is scheduled for 12-10-2015.  Please fax back/ or route the completed form to [facility] at [phone].   Sincerely,    Amy Esterwood PA-C

## 2015-11-12 NOTE — Telephone Encounter (Signed)
See note from 11-12-2015.

## 2015-11-12 NOTE — Progress Notes (Signed)
Subjective:    Patient ID: Felicia Hunter, female    DOB: 10-17-1940, 75 y.o.   MRN: [id number]  HPI Felicia Hunter is a very nice 75 year old white female known to Felicia Hunter. She has history of breast cancer status post right mastectomy in 2011, history of CVA 2 last about 4 years ago and has had intermittent atrial fibrillation. She has an implantable monitor is followed by Felicia Hunter. She has been maintained on Xarelto. Patient had undergone colonoscopy in April 2016 for complaints of rectal bleeding and was found to have a semi-pedunculated 12 mm rectal polyp which was completely removed and mild diverticulosis. Path from the polyp was a tubulovillous adenoma. Hunter was to follow-up in 3 years.  She comes in today with complaints of intermittent rectal bleeding over the past couple of months. She says this been very sporadic and that she will see a small amount of red blood or clot on one or 2 occasions and then may  not see any blood for a couple of weeks. She  had an episode within the past week with passage of a small clot again. She has no complaints of hemorrhoidal symptoms or rectal discomfort. No abdominal pain, no changes in bowel habits melena or hematochezia. Labs done on 10/23/2015 hemoglobin 14 hematocrit of 42 MCV of 96.  Review of Systems Pertinent positive and negative review of systems were noted in the above HPI section.  All other review of systems was otherwise negative.  Outpatient Encounter Prescriptions as of 11/12/2015  Medication Sig  . atorvastatin (LIPITOR) 20 MG tablet Take 20 mg by mouth every evening.   Marland Kitchen BIOTIN PO Take 1 tablet by mouth daily.  . calcium carbonate (CALCIUM 600) 600 MG TABS Take 600 mg by mouth daily.   . cholecalciferol (VITAMIN D) 1000 UNITS tablet Take 1,000 Units by mouth daily.  Marland Kitchen glucosamine-chondroitin 500-400 MG tablet Take 1 tablet by mouth daily.   . Multiple Vitamins-Minerals (MULTIVITAMIN & MINERAL PO) Take by mouth every morning.  Marland Kitchen omega-3  acid ethyl esters (LOVAZA) 1 G capsule Take 1 g by mouth 2 (two) times daily.   . polyvinyl alcohol (LIQUIFILM TEARS) 1.4 % ophthalmic solution Place 1 drop into both eyes as needed.   . rivaroxaban (XARELTO) 20 MG TABS tablet Take 1 tablet (20 mg total) by mouth daily with supper.  . vitamin C (ASCORBIC ACID) 500 MG tablet Take 500 mg by mouth daily as needed (Cold symptoms).   . Na Sulfate-K Sulfate-Mg Sulf 17.5-3.13-1.6 GM/180ML SOLN Take 1 kit by mouth once.   No facility-administered encounter medications on file as of 11/12/2015.    Allergies  Allergen Reactions  . Sulfonamide Derivatives Rash   Patient Active Problem List   Diagnosis Date Noted  . S/P left THA, AA 12/31/2014  . Cerebral infarction due to embolism of precerebral artery ([place]) 12/20/2013  . History of stroke 12/20/2013  . Expressive aphasia 11/05/2013  . Expected blood loss anemia 04/11/2013  . Hyponatremia 04/11/2013  . Other and unspecified hyperlipidemia 06/15/2012  . Lacunar infarction ([place]) 06/15/2012  . CVA (cerebral infarction)-mid/inf cerebellar infarct 3/25 06/08/2011  . Near syncope 06/07/2011  . 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 DEPRESSION 10/28/2006  . ALLERGIC RHINITIS 10/28/2006  . ASTHMA 10/28/2006   Social History   Social History  . Marital status: Widowed    Spouse name: N/A  . Number of children: 2  . Years of education: college   Occupational History  .  retired Retired  .  Retired   Social History Main Topics  . Smoking status: Never Smoker  . Smokeless tobacco: Never Used  . Alcohol use 0.6 oz/week    1 Glasses of wine per week     Comment: 1 glass of wine per night  . Drug use: No  . Sexual activity: Not on file   Other Topics Concern  . Not on file   Social History Narrative   Patient is single with 2 children.    Patient is left handed.   Patient has college education.   Patient drinks coffee daily    [name]'s family history  includes Aneurysm in her father.      Objective:    Vitals:   11/12/15 1056  BP: 112/80  Pulse: 76    Physical Exam  well-developed older white female in no acute distress, pleasant blood pressure 112/80 pulse 76 height 5 foot 5 weight 143 BMI 23.8. HEENT ;nontraumatic normocephalic EOMI PERRLA sclera anicteric, Cardiovascular; regular rate and rhythm with S1-S2 no murmur rub or gallop, Pulmonary ;clear bilaterally, Abdomen ;soft nontender nondistended bowel sounds are active there is no palpable mass or hepatosplenomegaly, Rectal ;exam not done, Extremities; no clubbing cyanosis or edema skin warm dry, Neuropsych ;mood and affect appropriate       Assessment & Hunter:   #67 75 year old female, chronically anticoagulated with Xarelto with complaints of intermittent bright red blood per rectum over the past 2 months. This is in a patient with history of a 12 mm pedunculated rectal polyp removed in April 2016. She did not have any internal hemorrhoids noted at that time. Need to rule out recurrent rectal polyp or other colonic lesion #2 diverticulosis #3 history of CVA #4 history of atrial fibrillation #5 history of breast cancer status post right mastectomy 2011  Hunter;Patient will be scheduled for colonoscopy with Felicia Hunter. Procedure was discussed in detail with the patient and she is agreeable to proceed. We will communicate with her cardiologist Felicia Hunter to assure  that holding Xarelto for 24 hours prior to colonoscopy is reasonable for this patient. Patient is asked to call should she have any increase in bleeding in the interim until colonoscopy is done.   Felicia S Esterwood PA-C 11/12/2015   Cc: Felicia Pretty, MD

## 2015-11-13 NOTE — Telephone Encounter (Signed)
I called the patient to advise her per Dr. Lovena Le, She can hold the Xarelto on 9-26, 9-27, and resume it on 12-11-2015.  The patient verbalized understanding the Xarelto instructions.

## 2015-11-13 NOTE — Telephone Encounter (Signed)
The patient has had a stroke and is on Xarelto. She will have a small stroke risk from stopping Xarelto. If you chose to proceed, you may stop xarelto for 2 days and restart the Xarelto when you think the risk of bleeding is low. Xarelto will make her blood thin in 2 hours after starting it back. GT

## 2015-11-20 ENCOUNTER — Telehealth: Payer: Self-pay | Admitting: Cardiology

## 2015-11-20 NOTE — Telephone Encounter (Signed)
LMOVM requesting that pt send manual transmission b/c home monitor has not updated in at least 14 days.    

## 2015-11-24 DIAGNOSIS — M810 Age-related osteoporosis without current pathological fracture: Secondary | ICD-10-CM | POA: Diagnosis not present

## 2015-11-25 LAB — CUP PACEART REMOTE DEVICE CHECK: MDC IDC SESS DTM: 20170816013633

## 2015-11-27 ENCOUNTER — Ambulatory Visit (INDEPENDENT_AMBULATORY_CARE_PROVIDER_SITE_OTHER): Payer: Medicare Other | Admitting: *Deleted

## 2015-11-27 DIAGNOSIS — Z23 Encounter for immunization: Secondary | ICD-10-CM | POA: Diagnosis not present

## 2015-11-27 DIAGNOSIS — I639 Cerebral infarction, unspecified: Secondary | ICD-10-CM

## 2015-11-28 NOTE — Progress Notes (Signed)
Carelink Summary Report / Loop Recorder 

## 2015-12-10 ENCOUNTER — Ambulatory Visit (AMBULATORY_SURGERY_CENTER): Payer: Medicare Other | Admitting: Gastroenterology

## 2015-12-10 ENCOUNTER — Encounter: Payer: Self-pay | Admitting: Gastroenterology

## 2015-12-10 VITALS — BP 151/74 | HR 57 | Temp 98.6°F | Resp 12 | Ht 65.0 in | Wt 143.0 lb

## 2015-12-10 DIAGNOSIS — K635 Polyp of colon: Secondary | ICD-10-CM | POA: Diagnosis not present

## 2015-12-10 DIAGNOSIS — K625 Hemorrhage of anus and rectum: Secondary | ICD-10-CM | POA: Diagnosis not present

## 2015-12-10 DIAGNOSIS — D125 Benign neoplasm of sigmoid colon: Secondary | ICD-10-CM

## 2015-12-10 DIAGNOSIS — K921 Melena: Secondary | ICD-10-CM

## 2015-12-10 DIAGNOSIS — D128 Benign neoplasm of rectum: Secondary | ICD-10-CM | POA: Diagnosis not present

## 2015-12-10 DIAGNOSIS — Z8673 Personal history of transient ischemic attack (TIA), and cerebral infarction without residual deficits: Secondary | ICD-10-CM | POA: Diagnosis not present

## 2015-12-10 DIAGNOSIS — D129 Benign neoplasm of anus and anal canal: Secondary | ICD-10-CM

## 2015-12-10 MED ORDER — SODIUM CHLORIDE 0.9 % IV SOLN: 500.0000 mL | INTRAVENOUS | Status: DC

## 2015-12-10 NOTE — Progress Notes (Signed)
  [facility] Anesthesia Post-op Note  Patient: Felicia Hunter  Procedure(s) Performed: colonoscopy  Patient Location: [facility] - Recovery Area  Anesthesia Type: Deep Sedation/Propofol  Level of Consciousness: awake, oriented and patient cooperative  Airway and Oxygen Therapy: Patient Spontanous Breathing  Post-op Pain: none  Post-op Assessment:  Post-op Vital signs reviewed, Patient's Cardiovascular Status Stable, Respiratory Function Stable, Patent Airway, No signs of Nausea or vomiting and Pain level controlled  Post-op Vital Signs: Reviewed and stable  Complications: No apparent anesthesia complications  Kimarie Coor E 2:12 PM

## 2015-12-10 NOTE — Progress Notes (Signed)
Called to room to assist during endoscopic procedure.  Patient ID and intended procedure confirmed with present staff. Received instructions for my participation in the procedure from the performing physician.  

## 2015-12-10 NOTE — Op Note (Signed)
[place] Patient Name: Felicia Hunter Procedure Date: 12/10/2015 1:20 PM MRN: [id number] Endoscopist: Ladene Artist , MD Age: 75 Referring MD:  Date of Birth: [birth year] Gender: Female Account #: [id number] Procedure:                Colonoscopy Indications:              Hematochezia, personal history of a tubulovillous                            adenoma Medicines:                Monitored Anesthesia Care Procedure:                Pre-Anesthesia Assessment:                           - Prior to the procedure, a History and Physical                            was performed, and patient medications and                            allergies were reviewed. The patient's tolerance of                            previous anesthesia was also reviewed. The risks                            and benefits of the procedure and the sedation                            options and risks were discussed with the patient.                            All questions were answered, and informed consent                            was obtained. Prior Anticoagulants: The patient has                            taken Xarelto (rivaroxaban), last dose was 2 days                            prior to procedure. ASA Grade Assessment: III - A                            patient with severe systemic disease. After                            reviewing the risks and benefits, the patient was                            deemed in satisfactory condition to undergo the  procedure.                           After obtaining informed consent, the colonoscope                            was passed under direct vision. Throughout the                            procedure, the patient's blood pressure, pulse, and                            oxygen saturations were monitored continuously. The                            Model PCF-H190L [id number]) scope was introduced                            through  the anus and advanced to the the cecum,                            identified by appendiceal orifice and ileocecal                            valve. The ileocecal valve, appendiceal orifice,                            and rectum were photographed. The quality of the                            bowel preparation was good. The colonoscopy was                            performed without difficulty. The patient tolerated                            the procedure well. Scope In: 1:44:22 PM Scope Out: 2:06:11 PM Scope Withdrawal Time: 0 hours 17 minutes 29 seconds  Total Procedure Duration: 0 hours 21 minutes 49 seconds  Findings:                 The perianal and digital rectal examinations were                            normal.                           A 10 mm polyp was found in the rectum just above                            the dentate line The polyp was semi-pedunculated.                            The polyp was removed with a piecemeal technique  using a hot snare. Resection and retrieval appeared                            complete.                           Internal hemorrhoids were found during                            retroflexion. The hemorrhoids were small and Grade                            I (internal hemorrhoids that do not prolapse).                           A 4 mm polyp was found in the sigmoid colon. The                            polyp was sessile. The polyp was removed with a                            cold biopsy forceps. Resection and retrieval were                            complete.                           A few small-mouthed diverticula were found in the                            sigmoid colon and ascending colon. There was no                            evidence of diverticular bleeding.                           The exam was otherwise without abnormality on                            direct and retroflexion views. Complications:             No immediate complications. Estimated blood loss:                            None. Estimated Blood Loss:     Estimated blood loss: none. Impression:               - One 10 mm polyp in the rectum, removed piecemeal                            using a hot snare. Resected and retrieved.                           - Internal hemorrhoids.                           -  One 4 mm polyp in the sigmoid colon, removed with                            a cold biopsy forceps. Resected and retrieved.                           - Mild diverticulosis in the sigmoid colon and in                            the ascending colon. There was no evidence of                            diverticular bleeding.                           - The examination was otherwise normal on direct                            and retroflexion views. Recommendation:           - Flexible sigmoidoscopy in 6 months to assess                            piecemeal polypectomy site and colonoscopy in 3                            years.                           - Resume Xarelto (rivaroxaban) in 2 days at prior                            dose. Refer to managing physician for further                            adjustment of therapy.                           - Patient has a contact number available for                            emergencies. The signs and symptoms of potential                            delayed complications were discussed with the                            patient. Return to normal activities tomorrow.                            Written discharge instructions were provided to the                            patient.                           -  Resume previous diet.                           - Continue present medications.                           - Await pathology results. Ladene Artist, MD 12/10/2015 2:14:52 PM This report has been signed electronically.

## 2015-12-10 NOTE — Patient Instructions (Signed)
YOU HAD AN ENDOSCOPIC PROCEDURE TODAY AT [facility]:   Refer to the procedure report that was given to you for any specific questions about what was found during the examination.  If the procedure report does not answer your questions, please call your gastroenterologist to clarify.  If you requested that your care partner not be given the details of your procedure findings, then the procedure report has been included in a sealed envelope for you to review at your convenience later.  YOU SHOULD EXPECT: Some feelings of bloating in the abdomen. Passage of more gas than usual.  Walking can help get rid of the air that was put into your GI tract during the procedure and reduce the bloating. If you had a lower endoscopy (such as a colonoscopy or flexible sigmoidoscopy) you may notice spotting of blood in your stool or on the toilet paper. If you underwent a bowel prep for your procedure, you may not have a normal bowel movement for a few days.  Please Note:  You might notice some irritation and congestion in your nose or some drainage.  This is from the oxygen used during your procedure.  There is no need for concern and it should clear up in a day or so.  SYMPTOMS TO REPORT IMMEDIATELY:   Following lower endoscopy (colonoscopy or flexible sigmoidoscopy):  Excessive amounts of blood in the stool  Significant tenderness or worsening of abdominal pains  Swelling of the abdomen that is new, acute  Fever of 100F or higher   For urgent or emergent issues, a gastroenterologist can be reached at any hour by calling [phone].   DIET:  We do recommend a small meal at first, but then you may proceed to your regular diet.  Drink plenty of fluids but you should avoid alcoholic beverages for 24 hours.  ACTIVITY:  You should plan to take it easy for the rest of today and you should NOT DRIVE or use heavy machinery until tomorrow (because of the sedation medicines used during the test).     FOLLOW UP: Our staff will call the number listed on your records the next business day following your procedure to check on you and address any questions or concerns that you may have regarding the information given to you following your procedure. If we do not reach you, we will leave a message.  However, if you are feeling well and you are not experiencing any problems, there is no need to return our call.  We will assume that you have returned to your regular daily activities without incident.  If any biopsies were taken you will be contacted by phone or by letter within the next 1-3 weeks.  Please call us at [phone] if you have not heard about the biopsies in 3 weeks.    SIGNATURES/CONFIDENTIALITY: You and/or your care partner have signed paperwork which will be entered into your electronic medical record.  These signatures attest to the fact that that the information above on your After Visit Summary has been reviewed and is understood.  Full responsibility of the confidentiality of this discharge information lies with you and/or your care-partner.   Resume Xarelto in 2 days,resume remainder of medication. Information given on polyps,diverticulosis and hemorrhoids, high fiber diet.. Flex sigmoid to be done in March of 2018.

## 2015-12-11 ENCOUNTER — Telehealth: Payer: Self-pay | Admitting: Cardiology

## 2015-12-11 ENCOUNTER — Telehealth: Payer: Self-pay | Admitting: *Deleted

## 2015-12-11 NOTE — Telephone Encounter (Signed)
LMOVM requesting that pt send manual transmission b/c home monitor has not updated in at least 14 days.    

## 2015-12-11 NOTE — Telephone Encounter (Signed)
  Follow up Call-  Call back number 12/10/2015 07/02/2014  Post procedure Call Back phone  # [phone]  Permission to leave phone message Yes Yes  Some recent data might be hidden     Patient questions:  Do you have a fever, pain , or abdominal swelling? No. Pain Score  0 *  Have you tolerated food without any problems? Yes.    Have you been able to return to your normal activities? Yes.    Do you have any questions about your discharge instructions: Diet   No. Medications  No. Follow up visit  No.  Do you have questions or concerns about your Care? No.  Actions: * If pain score is 4 or above: No action needed, pain <4.

## 2015-12-15 ENCOUNTER — Telehealth: Payer: Self-pay | Admitting: Internal Medicine

## 2015-12-15 NOTE — Telephone Encounter (Signed)
New Message:    Pt is wearing a loop recorder,she wanted you to know she will be out of town for 8 days.The monitor will not be with her.

## 2015-12-16 NOTE — Telephone Encounter (Signed)
Spoke with patient.  Advised that if she will be away from her home monitor for fewer than 14 nights, she does not need to bring her monitor with her.  Also advised patient that she does not need to make [place] aware in the future, unless she has questions or concerns.  Patient verbalizes understanding and appreciation.  She denies additional questions or concerns at this time.

## 2015-12-20 LAB — CUP PACEART REMOTE DEVICE CHECK: Date Time Interrogation Session: 20170915021144

## 2015-12-20 NOTE — Progress Notes (Signed)
Carelink summary report received. Battery status OK. Normal device function. No new symptom episodes, tachy episodes, brady, or pause episodes. No new AF episodes. Monthly summary reports and ROV/PRN 

## 2015-12-24 ENCOUNTER — Encounter: Payer: Self-pay | Admitting: Gastroenterology

## 2015-12-26 ENCOUNTER — Telehealth: Payer: Self-pay | Admitting: Cardiology

## 2015-12-26 NOTE — Telephone Encounter (Signed)
LMOVM requesting that pt send manual transmission b/c home monitor has not updated in at least 14 days.    

## 2015-12-29 ENCOUNTER — Ambulatory Visit (INDEPENDENT_AMBULATORY_CARE_PROVIDER_SITE_OTHER): Payer: Medicare Other | Admitting: *Deleted

## 2015-12-29 DIAGNOSIS — I639 Cerebral infarction, unspecified: Secondary | ICD-10-CM

## 2015-12-29 NOTE — Progress Notes (Signed)
Carelink Summary Report / Loop Recorder 

## 2016-01-08 ENCOUNTER — Ambulatory Visit: Payer: Medicare Other | Admitting: Gastroenterology

## 2016-01-13 DIAGNOSIS — M7121 Synovial cyst of popliteal space [Baker], right knee: Secondary | ICD-10-CM | POA: Diagnosis not present

## 2016-01-13 DIAGNOSIS — M25561 Pain in right knee: Secondary | ICD-10-CM | POA: Diagnosis not present

## 2016-01-15 DIAGNOSIS — M25561 Pain in right knee: Secondary | ICD-10-CM | POA: Diagnosis not present

## 2016-01-23 ENCOUNTER — Encounter: Payer: Self-pay | Admitting: Cardiology

## 2016-01-26 ENCOUNTER — Ambulatory Visit (INDEPENDENT_AMBULATORY_CARE_PROVIDER_SITE_OTHER): Payer: Medicare Other | Admitting: *Deleted

## 2016-01-26 DIAGNOSIS — I639 Cerebral infarction, unspecified: Secondary | ICD-10-CM

## 2016-01-27 NOTE — Progress Notes (Signed)
Carelink Summary Report / Loop Recorder 

## 2016-01-31 LAB — CUP PACEART REMOTE DEVICE CHECK
Date Time Interrogation Session: 20171015024159
Implantable Pulse Generator Implant Date: 20150826

## 2016-01-31 NOTE — Progress Notes (Signed)
Carelink summary report received. Battery status OK. Normal device function. No new symptom episodes, tachy episodes, brady, or pause episodes. No new AF episodes. Monthly summary reports and ROV/PRN 

## 2016-02-09 DIAGNOSIS — H04123 Dry eye syndrome of bilateral lacrimal glands: Secondary | ICD-10-CM | POA: Diagnosis not present

## 2016-02-09 DIAGNOSIS — H5213 Myopia, bilateral: Secondary | ICD-10-CM | POA: Diagnosis not present

## 2016-02-09 DIAGNOSIS — H43813 Vitreous degeneration, bilateral: Secondary | ICD-10-CM | POA: Diagnosis not present

## 2016-02-09 DIAGNOSIS — H25813 Combined forms of age-related cataract, bilateral: Secondary | ICD-10-CM | POA: Diagnosis not present

## 2016-02-17 ENCOUNTER — Other Ambulatory Visit: Payer: Self-pay | Admitting: Internal Medicine

## 2016-02-17 DIAGNOSIS — I480 Paroxysmal atrial fibrillation: Secondary | ICD-10-CM

## 2016-02-19 ENCOUNTER — Telehealth: Payer: Self-pay | Admitting: Cardiology

## 2016-02-19 NOTE — Telephone Encounter (Signed)
LMOVM requesting that pt send manual transmission b/c home monitor has not updated in at least 14 days.    

## 2016-02-20 DIAGNOSIS — E7800 Pure hypercholesterolemia, unspecified: Secondary | ICD-10-CM | POA: Diagnosis not present

## 2016-02-20 DIAGNOSIS — Z7982 Long term (current) use of aspirin: Secondary | ICD-10-CM | POA: Diagnosis not present

## 2016-02-20 DIAGNOSIS — Z0000 Encounter for general adult medical examination without abnormal findings: Secondary | ICD-10-CM | POA: Diagnosis not present

## 2016-02-20 DIAGNOSIS — M8580 Other specified disorders of bone density and structure, unspecified site: Secondary | ICD-10-CM | POA: Diagnosis not present

## 2016-02-20 DIAGNOSIS — E559 Vitamin D deficiency, unspecified: Secondary | ICD-10-CM | POA: Diagnosis not present

## 2016-02-25 ENCOUNTER — Ambulatory Visit (INDEPENDENT_AMBULATORY_CARE_PROVIDER_SITE_OTHER): Payer: Medicare Other | Admitting: *Deleted

## 2016-02-25 DIAGNOSIS — I639 Cerebral infarction, unspecified: Secondary | ICD-10-CM | POA: Diagnosis not present

## 2016-02-25 DIAGNOSIS — I341 Nonrheumatic mitral (valve) prolapse: Secondary | ICD-10-CM | POA: Diagnosis not present

## 2016-02-25 DIAGNOSIS — K219 Gastro-esophageal reflux disease without esophagitis: Secondary | ICD-10-CM | POA: Diagnosis not present

## 2016-02-25 DIAGNOSIS — Z7901 Long term (current) use of anticoagulants: Secondary | ICD-10-CM | POA: Diagnosis not present

## 2016-02-25 DIAGNOSIS — R252 Cramp and spasm: Secondary | ICD-10-CM | POA: Diagnosis not present

## 2016-02-26 NOTE — Progress Notes (Signed)
Carelink Summary Report / Loop Recorder 

## 2016-03-01 ENCOUNTER — Ambulatory Visit (INDEPENDENT_AMBULATORY_CARE_PROVIDER_SITE_OTHER): Payer: Medicare Other | Admitting: Sports Medicine

## 2016-03-01 ENCOUNTER — Encounter: Payer: Self-pay | Admitting: Sports Medicine

## 2016-03-01 DIAGNOSIS — M79604 Pain in right leg: Secondary | ICD-10-CM | POA: Diagnosis not present

## 2016-03-01 DIAGNOSIS — I6310 Cerebral infarction due to embolism of unspecified precerebral artery: Secondary | ICD-10-CM

## 2016-03-01 NOTE — Progress Notes (Signed)
   Subjective:    Patient ID: Felicia Hunter, female    DOB: [birth year], 75 y.o.   MRN: [id number]  HPI  Patient presents with R leg pain.   Began about four months ago. Pain mostly located posteriorly (hamstring region). Also has intermittent knee pain. Pain is worst when first waking up in the morning or when first taking a step after sitting or laying for a while. Patient exercises 5 days a week (water aerobics three days a week, Silver Sneakers two days), and does not have pain when exercising. Reports pain is better after exercising. Has tried icing her knee which does not improve pain. Has some improvement after using heat on back of leg. Also has pain when walking for extended periods of time. Patient cannot identify any precipitating factors, including trauma or increase in exercise.   Review of Systems Denies numbness, weakness, or sharp pain. Denies locking or catching of affected knee or leg.     Objective:   Physical Exam  Constitutional: She is oriented to person, place, and time. She appears well-developed and well-nourished.  HENT:  Head: Normocephalic and atraumatic.  Pulmonary/Chest: Effort normal. No respiratory distress.  Musculoskeletal:  R knee:  No bony abnormalities noted Minimal swelling of R knee medially No erythema or TTP Pain with knee flexion Good strength of knee and hip  Good ROM Palpable Baker's cyst  Neurological: She is alert and oriented to person, place, and time.  Skin: Skin is warm and dry.  Psychiatric: She has a normal mood and affect. Her behavior is normal.      Assessment & Plan:  Right leg pain Likely combination of Baker's cyst and knee OA. Patient also with leg length discrepancy s/p hip replacement which may be contributing as well. - Begin wearing heel lifts in R leg - Can follow up with ortho (Dr. Alvan Dame) for further evaluation and possible drainage of Baker's cyst - Can consider steroid injection in knee, however leg pain more  likely 2/2 Baker's cyst than OA alone - Ice affected areas after exercise - F/u PRN  Adin Hector, MD, MPH PGY-2 [facility] Pager [phone]  Patient seen and evaluated with the resident. I agree with the above plan of care. I think the patient's symptoms are likely originating from her right knee, specifically a Baker's cyst which she has previously been diagnosed with. Her symptoms appear to be present only with first standing and first thing in the morning. Otherwise it does not appear to be affecting her quality of life. She does have a small leg length discrepancy so we will try a small 3/16 inch heel lift. Patient has an established relationship with Dr. Alvan Dame and she would like to return to him for her continued orthopedic care. I recommend that she follow-up with him to discuss possible Baker's cyst aspiration if her symptoms warrant. Follow-up with me as needed.

## 2016-03-01 NOTE — Assessment & Plan Note (Signed)
Likely combination of Baker's cyst and knee OA. Patient also with leg length discrepancy s/p hip replacement which may be contributing as well. - Begin wearing heel lifts in R leg - Can follow up with ortho (Dr. Alvan Dame) for further evaluation and possible drainage of Baker's cyst - Can consider steroid injection in knee, however leg pain more likely 2/2 Baker's cyst than OA alone - Ice affected areas after exercise - F/u PRN

## 2016-03-04 ENCOUNTER — Telehealth: Payer: Self-pay | Admitting: Cardiology

## 2016-03-04 NOTE — Telephone Encounter (Signed)
LMOVM requesting that pt send manual transmission b/c home monitor has not updated in at least 14 days.    

## 2016-03-10 LAB — CUP PACEART REMOTE DEVICE CHECK
Implantable Pulse Generator Implant Date: 20150826
MDC IDC SESS DTM: 20171114040542

## 2016-03-26 ENCOUNTER — Telehealth: Payer: Self-pay | Admitting: *Deleted

## 2016-03-26 ENCOUNTER — Encounter: Payer: Self-pay | Admitting: Nurse Practitioner

## 2016-03-26 ENCOUNTER — Ambulatory Visit (INDEPENDENT_AMBULATORY_CARE_PROVIDER_SITE_OTHER): Payer: Medicare Other | Admitting: *Deleted

## 2016-03-26 ENCOUNTER — Ambulatory Visit (INDEPENDENT_AMBULATORY_CARE_PROVIDER_SITE_OTHER): Payer: Medicare Other | Admitting: Nurse Practitioner

## 2016-03-26 VITALS — BP 138/74 | HR 68 | Ht 64.17 in | Wt 144.0 lb

## 2016-03-26 DIAGNOSIS — Z7901 Long term (current) use of anticoagulants: Secondary | ICD-10-CM | POA: Diagnosis not present

## 2016-03-26 DIAGNOSIS — I639 Cerebral infarction, unspecified: Secondary | ICD-10-CM | POA: Diagnosis not present

## 2016-03-26 DIAGNOSIS — D126 Benign neoplasm of colon, unspecified: Secondary | ICD-10-CM | POA: Diagnosis not present

## 2016-03-26 NOTE — Patient Instructions (Signed)
You have been scheduled for a flexible sigmoidoscopy. Please follow the written instructions given to you at your visit today. If you use inhalers (even only as needed), please bring them with you on the day of your procedure.  

## 2016-03-26 NOTE — Progress Notes (Addendum)
[id number] is a 76 year old female with a history of a 10 mm tubulovillous adenomatous polyp removed by piecemeal polypectomy from the rectum, just above the dentate line in September 2017. Patient is due for a flexible sigmoidoscopy to check for any residual tissue. She is anticoagulated for history of atrial fibrillation and CVA x 2. No rectal bleeding or other GI complaints.    Past Medical History:  Diagnosis Date  . Alopecia   . Arthritis   . Atrial fibrillation ([place])   . Breast cancer ([place])    double mastectomy 2011  . Clavicle fracture    right clavicle-hardware retained  . Clotting disorder ([place])    with CVA- 2013, 2014  . Cough    PERSISTANT COUGH X 20 YRS  . Difficulty sleeping    takes Ambien  . Disorders of bilirubin excretion   . Diverticulosis   . Environmental and seasonal allergies    "dry cough", related to ? allergies  . GERD (gastroesophageal reflux disease)   . Hyperlipidemia   . MVP (mitral valve prolapse)   . Osteopenia   . Osteoporosis   . Stroke ([place]) 06/07/11   x2 episodes-"funny feeling"" equilibrium off x24 hours, x1 speech affected- no residuallast 1 yr ago.  . Tubulovillous adenoma of colon 08/2003   Past surgical history, family history, social history reviewed in Epic.    Allergies  Allergen Reactions  . Sulfonamide Derivatives Rash   Review of Systems: All systems reviewed and negative except where noted in HPI.    Physical Exam: BP 138/74   Pulse 68   Ht 5' 4.17" (1.63 m)   Wt 144 lb (65.3 kg)   BMI 24.58 kg/m  Constitutional:  Well-developed, white female in no acute distress. Psychiatric: Normal mood and affect. Behavior is normal. HEENT: Normocephalic and atraumatic. Conjunctivae are normal. No scleral icterus. Neck supple.  Cardiovascular: Normal rate, regular rhythm.  Pulmonary/chest: Effort normal and breath sounds normal. No wheezing, rales or rhonchi. Abdominal: Soft, nondistended, nontender. Bowel sounds active  throughout. There are no masses palpable. No hepatomegaly. Extremities: no edema Lymphadenopathy: No cervical adenopathy noted. Neurological: Alert and oriented to person place and time. Skin: Skin is warm and dry. No rashes noted.   ASSESSMENT AND PLAN:  3. 76 yo female with history of TVA colon polyp, s/p piecemeal polypectomy. At time of last colonoscopy in Sept 2017 a 10 mm TVA polyp was removed piecemeal fashion from the rectum,  just above dentate line. She is in need of a flexible sigmoidoscopy to check for any residual tissue at polypectomy site. - Patient will be scheduled for flexible sigmoidoscopy off anticoagulant. Given the distal location of the polyp a couple of enemas prior to procedure should suffice for a bowel prep. The risks and benefits of the procedure were discussed and the patient agrees to proceed. Xarelto will need to be held prior to the procedure, see #2  2. Hx of AFib / CVA. On Xarelto. Has loop recorder. Hold Xarelto for two days before procedure - will instruct when and how to resume after procedure. Patient aware that there is a low but real risk of cardiovascular event such as a embolism / recurrent stroke off Xarelto. The patient consents to proceed. Will communicate by phone or EMR with patient's prescribing provider to confirm that holding Xarelto is reasonable in this case.    Tye Savoy, NP  03/26/2016, 10:57 AM    Reviewed and agree with management plan.  Norberto Sorenson  Sindy Guadeloupe, MD [facility]

## 2016-03-26 NOTE — Telephone Encounter (Signed)
03/26/2016   RE: Felicia Hunter DOB: 1940/05/11 MRN: [id number]   Dear Dr. Cristopher Peru,    We have scheduled the above patient for an endoscopic procedure. Our records show that she is on anticoagulation therapy.   Please advise as to how long the patient may come off her therapy of Xarelto prior to the procedure, Flexible Sigmoidoscopy, which is scheduled for 04-09-2016.  Pleaseroute the Xarelto clearance instructions to [facility] CMA.   Sincerely,    Tye Savoy NP-C.

## 2016-03-29 NOTE — Progress Notes (Signed)
Carelink Summary Report / Loop Recorder 

## 2016-03-30 NOTE — Telephone Encounter (Signed)
May hold anti-coagulation for 2 days before the procedure. GT

## 2016-03-30 NOTE — Telephone Encounter (Signed)
Called patient to advise she can hold the Xarelto 2 days prior and including the procedure date ( 3 days total).  She can resume it on 04-10-2016.  The patient verbalized understanding the instructions.

## 2016-04-01 ENCOUNTER — Encounter: Payer: Medicare Other | Admitting: Internal Medicine

## 2016-04-09 ENCOUNTER — Encounter: Payer: Self-pay | Admitting: Gastroenterology

## 2016-04-09 ENCOUNTER — Ambulatory Visit (AMBULATORY_SURGERY_CENTER): Payer: Medicare Other | Admitting: Gastroenterology

## 2016-04-09 VITALS — BP 136/79 | HR 56 | Temp 99.1°F | Resp 15 | Ht 64.0 in | Wt 144.0 lb

## 2016-04-09 DIAGNOSIS — K621 Rectal polyp: Secondary | ICD-10-CM | POA: Diagnosis not present

## 2016-04-09 DIAGNOSIS — Z86010 Personal history of colonic polyps: Secondary | ICD-10-CM

## 2016-04-09 DIAGNOSIS — R55 Syncope and collapse: Secondary | ICD-10-CM | POA: Diagnosis not present

## 2016-04-09 DIAGNOSIS — J45909 Unspecified asthma, uncomplicated: Secondary | ICD-10-CM | POA: Diagnosis not present

## 2016-04-09 DIAGNOSIS — F329 Major depressive disorder, single episode, unspecified: Secondary | ICD-10-CM | POA: Diagnosis not present

## 2016-04-09 DIAGNOSIS — Z8673 Personal history of transient ischemic attack (TIA), and cerebral infarction without residual deficits: Secondary | ICD-10-CM | POA: Diagnosis not present

## 2016-04-09 DIAGNOSIS — D129 Benign neoplasm of anus and anal canal: Secondary | ICD-10-CM

## 2016-04-09 DIAGNOSIS — D128 Benign neoplasm of rectum: Secondary | ICD-10-CM

## 2016-04-09 MED ORDER — SODIUM CHLORIDE 0.9 % IV SOLN: 500.0000 mL | INTRAVENOUS | Status: DC

## 2016-04-09 NOTE — Patient Instructions (Signed)
YOU HAD AN ENDOSCOPIC PROCEDURE TODAY AT [facility]:   Refer to the procedure report that was given to you for any specific questions about what was found during the examination.  If the procedure report does not answer your questions, please call your gastroenterologist to clarify.  If you requested that your care partner not be given the details of your procedure findings, then the procedure report has been included in a sealed envelope for you to review at your convenience later.  YOU SHOULD EXPECT: Some feelings of bloating in the abdomen. Passage of more gas than usual.  Walking can help get rid of the air that was put into your GI tract during the procedure and reduce the bloating. If you had a lower endoscopy (such as a colonoscopy or flexible sigmoidoscopy) you may notice spotting of blood in your stool or on the toilet paper. If you underwent a bowel prep for your procedure, you may not have a normal bowel movement for a few days.  Please Note:  You might notice some irritation and congestion in your nose or some drainage.  This is from the oxygen used during your procedure.  There is no need for concern and it should clear up in a day or so.  SYMPTOMS TO REPORT IMMEDIATELY:   Following lower endoscopy (colonoscopy or flexible sigmoidoscopy):  Excessive amounts of blood in the stool  Significant tenderness or worsening of abdominal pains  Swelling of the abdomen that is new, acute  Fever of 100F or higher  For urgent or emergent issues, a gastroenterologist can be reached at any hour by calling [phone].   DIET:  We do recommend a small meal at first, but then you may proceed to your regular diet.  Drink plenty of fluids but you should avoid alcoholic beverages for 24 hours.  ACTIVITY:  You should plan to take it easy for the rest of today and you should NOT DRIVE or use heavy machinery until tomorrow (because of the sedation medicines used during the test).     FOLLOW UP: Our staff will call the number listed on your records the next business day following your procedure to check on you and address any questions or concerns that you may have regarding the information given to you following your procedure. If we do not reach you, we will leave a message.  However, if you are feeling well and you are not experiencing any problems, there is no need to return our call.  We will assume that you have returned to your regular daily activities without incident.  If any biopsies were taken you will be contacted by phone or by letter within the next 1-3 weeks.  Please call us at [phone] if you have not heard about the biopsies in 3 weeks.   Diverticulosis (handout given) Polyps (handout given) Await biopsy results to determined next repeat Colonoscopy No aspirin, ibuprofen, naproxen or other  Non-steriodal ant-inflammatory  medications for 2 weeks after polyps removal. Resume Xarelto on Sunday   SIGNATURES/CONFIDENTIALITY: You and/or your care partner have signed paperwork which will be entered into your electronic medical record.  These signatures attest to the fact that that the information above on your After Visit Summary has been reviewed and is understood.  Full responsibility of the confidentiality of this discharge information lies with you and/or your care-partner.

## 2016-04-09 NOTE — Progress Notes (Signed)
To recovery vss report to rn michelle

## 2016-04-09 NOTE — Op Note (Signed)
[facility] Patient Name: Felicia Hunter Procedure Date: 04/09/2016 2:40 PM MRN: [id number] Endoscopist: Ladene Artist , MD Age: 76 Referring MD:  Date of Birth: 09/28/1940 Gender: Female Account #: [id number] Procedure:                Flexible Sigmoidoscopy Indications:              Surveillance: Personal history of piecemeal removal                            of adenoma on last colonoscopy (less than 6 months                            ago) Medicines:                Monitored Anesthesia Care Procedure:                Pre-Anesthesia Assessment:                           - Prior to the procedure, a History and Physical                            was performed, and patient medications and                            allergies were reviewed. The patient's tolerance of                            previous anesthesia was also reviewed. The risks                            and benefits of the procedure and the sedation                            options and risks were discussed with the patient.                            All questions were answered, and informed consent                            was obtained. Prior Anticoagulants: The patient has                            taken Xarelto (rivaroxaban), last dose was 2 days                            prior to procedure. ASA Grade Assessment: III - A                            patient with severe systemic disease. After                            reviewing the risks and benefits, the patient was  deemed in satisfactory condition to undergo the                            procedure.                           After obtaining informed consent, the scope was                            passed under direct vision. The Model PCF-H190L                            [id number]) scope was introduced through the anus                            and advanced to the the sigmoid colon. The flexible    sigmoidoscopy was accomplished without difficulty.                            The patient tolerated the procedure well. The                            quality of the bowel preparation was good. Scope In: 2:45:24 PM Scope Out: 2:52:15 PM Total Procedure Duration: 0 hours 6 minutes 51 seconds  Findings:                 The perianal and digital rectal examinations were                            normal.                           Two sessile polyps were found in the distal rectum                            just above the dentate line. The polyps were 4 to 5                            mm in size. These polyps were removed with a hot                            biopsy forceps. Resection and retrieval were                            complete.                           A 5 mm scar was found in the distal rectum just                            above the dentate line. There was no evidence of                            the previous polyp.  A few small-mouthed diverticula were found in the                            sigmoid colon. There was no evidence of                            diverticular bleeding. Complications:            No immediate complications. Estimated Blood Loss:     Estimated blood loss: none. Impression:               - Two 4 to 5 mm polyps in the distal rectum,                            removed with a hot biopsy forceps. Resected and                            retrieved.                           - Scar in the distal rectum.                           - Mild diverticulosis in the sigmoid colon. Recommendation:           - Patient has a contact number available for                            emergencies. The signs and symptoms of potential                            delayed complications were discussed with the                            patient. Return to normal activities tomorrow.                            Written discharge instructions were provided  to the                            patient.                           - Resume previous diet.                           - Continue present medications.                           - No aspirin, ibuprofen, naproxen, or other                            non-steroidal anti-inflammatory drugs for 2 weeks                            after polyp removal.                           -  Resume Xarleto in 2 days                           - Await pathology                           - Colonoscopy in 11/2018 Ladene Artist, MD 04/09/2016 3:03:47 PM This report has been signed electronically.

## 2016-04-12 ENCOUNTER — Telehealth: Payer: Self-pay

## 2016-04-12 NOTE — Telephone Encounter (Signed)
  Follow up Call-  Call back number 04/09/2016 12/10/2015 07/02/2014  Post procedure Call Back phone  # [phone]  Permission to leave phone message Yes Yes Yes  Some recent data might be hidden     Patient questions:  Do you have a fever, pain , or abdominal swelling? No. Pain Score  0 *  Have you tolerated food without any problems? Yes.    Have you been able to return to your normal activities? Yes.    Do you have any questions about your discharge instructions: Diet   No. Medications  No. Follow up visit  No.  Do you have questions or concerns about your Care? No.  Actions: * If pain score is 4 or above: No action needed, pain <4.

## 2016-04-13 ENCOUNTER — Telehealth: Payer: Self-pay | Admitting: Cardiology

## 2016-04-13 NOTE — Telephone Encounter (Signed)
Spoke w/ pt and requested that she send a manual transmission b/c her home monitor has not updated in at least 14 days.   

## 2016-04-16 ENCOUNTER — Encounter: Payer: Self-pay | Admitting: Gastroenterology

## 2016-04-17 LAB — CUP PACEART REMOTE DEVICE CHECK
Date Time Interrogation Session: 20171214050612
Implantable Pulse Generator Implant Date: 20150826

## 2016-04-17 NOTE — Progress Notes (Signed)
Carelink summary report received. Battery status OK. Normal device function. No new symptom episodes, tachy episodes, brady, or pause episodes. No new AF episodes. Monthly summary reports and ROV/PRN 

## 2016-04-26 ENCOUNTER — Ambulatory Visit (INDEPENDENT_AMBULATORY_CARE_PROVIDER_SITE_OTHER): Payer: Medicare Other | Admitting: *Deleted

## 2016-04-26 DIAGNOSIS — I639 Cerebral infarction, unspecified: Secondary | ICD-10-CM

## 2016-04-27 NOTE — Progress Notes (Signed)
Carelink Summary Report / Loop Recorder 

## 2016-04-28 ENCOUNTER — Telehealth: Payer: Self-pay | Admitting: Cardiology

## 2016-04-28 NOTE — Telephone Encounter (Signed)
Spoke w/ pt and requested that she send a manual transmission b/c her home monitor has not updated in at least 14 days.   

## 2016-05-03 ENCOUNTER — Encounter: Payer: Self-pay | Admitting: Internal Medicine

## 2016-05-03 ENCOUNTER — Ambulatory Visit (INDEPENDENT_AMBULATORY_CARE_PROVIDER_SITE_OTHER): Payer: Medicare Other | Admitting: Internal Medicine

## 2016-05-03 VITALS — BP 128/72 | HR 77 | Ht 65.0 in | Wt 145.2 lb

## 2016-05-03 DIAGNOSIS — Z8673 Personal history of transient ischemic attack (TIA), and cerebral infarction without residual deficits: Secondary | ICD-10-CM | POA: Diagnosis not present

## 2016-05-03 DIAGNOSIS — R55 Syncope and collapse: Secondary | ICD-10-CM

## 2016-05-03 DIAGNOSIS — I639 Cerebral infarction, unspecified: Secondary | ICD-10-CM | POA: Diagnosis not present

## 2016-05-03 MED ORDER — RIVAROXABAN 20 MG PO TABS: 20.0000 mg | ORAL_TABLET | Freq: Every day | ORAL | 3 refills | Status: DC

## 2016-05-03 NOTE — Patient Instructions (Signed)

## 2016-05-03 NOTE — Progress Notes (Signed)
HPI  Felicia Hunter returns today for followup. She has a h/o cryptogenic stroke, s/p ILR insertion. In the interim, she has had hip replacement surgery. She has had documented atrial fib and is on Xarelto. She has just gotten back from a trip to the [employer]. She feels well. She is discouraged by a diagnosis osteoporosis. She is on Fosamax. Allergies  Allergen Reactions  . Sulfonamide Derivatives Rash     Current Outpatient Prescriptions  Medication Sig Dispense Refill  . alendronate (FOSAMAX) 70 MG tablet once a week. Take as directed    . atorvastatin (LIPITOR) 20 MG tablet Take 20 mg by mouth every evening.     Marland Kitchen BIOTIN PO Take 1 tablet by mouth daily.    . calcium carbonate (CALCIUM 600) 600 MG TABS Take 600 mg by mouth daily.     . cholecalciferol (VITAMIN D) 1000 UNITS tablet Take 1,000 Units by mouth daily.    . Multiple Vitamins-Minerals (MULTIVITAMIN & MINERAL PO) Take 1 capsule by mouth as directed. Only takes occasionally    . omega-3 acid ethyl esters (LOVAZA) 1 G capsule Take 1 g by mouth 2 (two) times daily.     . polyvinyl alcohol (LIQUIFILM TEARS) 1.4 % ophthalmic solution Place 1 drop into both eyes as needed (Use as directed).     . rivaroxaban (XARELTO) 20 MG TABS tablet Take 20 mg by mouth daily with supper.    . vitamin C (ASCORBIC ACID) 500 MG tablet Take 500 mg by mouth as needed (Cold symptoms). Take as directed    . zolpidem (AMBIEN) 10 MG tablet Take 10 mg by mouth at bedtime as needed for sleep.      Current Facility-Administered Medications  Medication Dose Route Frequency Provider Last Rate Last Dose  . 0.9 %  sodium chloride infusion  500 mL Intravenous Continuous Ladene Artist, MD      . 0.9 %  sodium chloride infusion  500 mL Intravenous Continuous Ladene Artist, MD         Past Medical History:  Diagnosis Date  . Alopecia   . Arthritis   . Atrial fibrillation ([place])   . Breast cancer ([place])    double mastectomy 2011  . Clavicle  fracture    right clavicle-hardware retained  . Clotting disorder ([place])    with CVA- 2013, 2014  . Cough    PERSISTANT COUGH X 20 YRS  . Difficulty sleeping    takes Ambien  . Disorders of bilirubin excretion   . Diverticulosis   . Environmental and seasonal allergies    "dry cough", related to ? allergies  . GERD (gastroesophageal reflux disease)   . Hyperlipidemia   . MVP (mitral valve prolapse)   . Osteopenia   . Osteoporosis   . Stroke ([place]) 06/07/11   x2 episodes-"funny feeling"" equilibrium off x24 hours, x1 speech affected- no residuallast 1 yr ago.  . Tubulovillous adenoma of colon 08/2003    ROS:   All systems reviewed and negative except as noted in the HPI.   Past Surgical History:  Procedure Laterality Date  . ABDOMINAL HYSTERECTOMY  1993  . BREAST CAPSULOTOMY     after bilat mastectomies-implants removed  . BREAST ENHANCEMENT SURGERY     x3-then removed 2011 for bilat mastectomies  . BREAST SURGERY  june 2011   double mastectomy nodes from rt  . CHEST WALL TUMOR EXCISION  5/12   noduals removed-neg  . collarbone  september 2011  pinned and plated, right  . COLONOSCOPY    . JOINT REPLACEMENT  july 9th, 2012   left knee replacement   . KNEE RECONSTRUCTION  7/12   revision lt total knee  . LOOP RECORDER IMPLANT N/A 11/07/2013   Procedure: LOOP RECORDER IMPLANT;  Surgeon: Evans Lance, MD;  Location: [facility] CATH LAB;  Service: Cardiovascular;  Laterality: N/A;  . MASS EXCISION Left 06/19/2012   Procedure: EXCISION OF LARGE MASS LEFT NECK/SHOULDER WITH PLASTIC CLOSURE;  Surgeon: Cristine Polio, MD;  Location: [facility];  Service: Plastics;  Laterality: Left;  . NASAL SEPTUM SURGERY    . TEE WITHOUT CARDIOVERSION N/A 11/07/2013   Procedure: TRANSESOPHAGEAL ECHOCARDIOGRAM (TEE);  Surgeon: Dorothy Spark, MD;  Location: [facility];  Service: Cardiovascular;  Laterality: N/A;  . TONSILLECTOMY    . TOTAL HIP ARTHROPLASTY Right 04/10/2013    Procedure: RIGHT TOTAL HIP ARTHROPLASTY ANTERIOR APPROACH;  Surgeon: [name], MD;  Location: [facility];  Service: Orthopedics;  Laterality: Right;  . TOTAL HIP ARTHROPLASTY Left 12/31/2014   Procedure: LEFT TOTAL HIP ARTHROPLASTY ANTERIOR APPROACH;  Surgeon: Paralee Cancel, MD;  Location: [facility];  Service: Orthopedics;  Laterality: Left;  . UPPER GASTROINTESTINAL ENDOSCOPY    . VEIN SURGERY  1974   left leg     Family History  Problem Relation Age of Onset  . Aneurysm Father   . Colon cancer Neg Hx   . Esophageal cancer Neg Hx   . Rectal cancer Neg Hx   . Stomach cancer Neg Hx      Social History   Social History  . Marital status: Widowed    Spouse name: N/A  . Number of children: 2  . Years of education: college   Occupational History  . retired Retired  .  Retired   Social History Main Topics  . Smoking status: Never Smoker  . Smokeless tobacco: Never Used  . Alcohol use 0.6 oz/week    1 Glasses of wine per week     Comment: 1 glass of wine per night  . Drug use: No  . Sexual activity: Not on file   Other Topics Concern  . Not on file   Social History Narrative   Patient is single with 2 children.    Patient is left handed.   Patient has college education.   Patient drinks coffee daily     BP 128/72   Pulse 77   Ht 5\' 5"  (1.651 m)   Wt 145 lb 3.2 oz (65.9 kg)   BMI 24.16 kg/m   Physical Exam:  Well appearing 76 yo woman, NAD HEENT: Unremarkable except for resolving orbital ecchymosis Neck:  6 cm JVD, no thyromegally Lymphatics:  No adenopathy Back:  No CVA tenderness Lungs:  Clear with no wheezes HEART:  Regular rate rhythm, no murmurs, no rubs, no clicks Abd:  soft, positive bowel sounds, no organomegally, no rebound, no guarding Ext:  2 plus pulses, no edema, no cyanosis, no clubbing Skin:  No rashes no nodules Neuro:  CN II through XII intact, motor grossly intact   DEVICE  Normal device function.  See PaceArt for details. No atrial  fib.  Assess/Plan: 1. PAF - she is mostly maintaining NSR. She will continue her current meds. 2. ILR - her device has demonstrated occaisional rapid HR's. No long pauses. Rare atrial fib. 3. Dyslipidemia - she will continue her statin therapy.  Mikle Bosworth.D.

## 2016-05-04 LAB — CUP PACEART INCLINIC DEVICE CHECK
Date Time Interrogation Session: 20180219165217
MDC IDC PG IMPLANT DT: 20150826

## 2016-05-06 LAB — CUP PACEART REMOTE DEVICE CHECK
Implantable Pulse Generator Implant Date: 20150826
MDC IDC SESS DTM: 20180113050800

## 2016-05-13 ENCOUNTER — Telehealth: Payer: Self-pay | Admitting: Cardiology

## 2016-05-13 NOTE — Telephone Encounter (Signed)
LMOVM requesting that pt send manual transmission b/c home monitor has not updated in at least 14 days.    

## 2016-05-18 LAB — CUP PACEART REMOTE DEVICE CHECK
Implantable Pulse Generator Implant Date: 20150826
MDC IDC SESS DTM: 20180212050657

## 2016-05-18 NOTE — Progress Notes (Signed)
Carelink summary report received. Battery status OK. Normal device function. No new symptom episodes, tachy episodes, brady, or pause episodes. No new AF episodes. Monthly summary reports and ROV/PRN 

## 2016-05-26 ENCOUNTER — Ambulatory Visit (INDEPENDENT_AMBULATORY_CARE_PROVIDER_SITE_OTHER): Payer: Medicare Other | Admitting: *Deleted

## 2016-05-26 DIAGNOSIS — I639 Cerebral infarction, unspecified: Secondary | ICD-10-CM | POA: Diagnosis not present

## 2016-05-26 NOTE — Progress Notes (Signed)
Carelink Summary Report / Loop Recorder 

## 2016-06-03 LAB — CUP PACEART REMOTE DEVICE CHECK
Implantable Pulse Generator Implant Date: 20150826
MDC IDC SESS DTM: 20180314054031

## 2016-06-03 NOTE — Progress Notes (Signed)
Care[facility] summary report received. Battery status OK. Normal device function. No new symptom episodes, brady, or pause episodes. No new AF episodes. 1 tachy- ECG appears SVT/PAT. See ECG. Monthly summary reports and ROV/PRN

## 2016-06-25 ENCOUNTER — Ambulatory Visit (INDEPENDENT_AMBULATORY_CARE_PROVIDER_SITE_OTHER): Payer: Medicare Other | Admitting: *Deleted

## 2016-06-25 DIAGNOSIS — I639 Cerebral infarction, unspecified: Secondary | ICD-10-CM | POA: Diagnosis not present

## 2016-06-25 NOTE — Progress Notes (Signed)
Carelink Summary Report / Loop Recorder 

## 2016-07-07 LAB — CUP PACEART REMOTE DEVICE CHECK
Implantable Pulse Generator Implant Date: 20150826
MDC IDC SESS DTM: 20180413061750

## 2016-07-11 DIAGNOSIS — T1512XA Foreign body in conjunctival sac, left eye, initial encounter: Secondary | ICD-10-CM | POA: Diagnosis not present

## 2016-07-26 ENCOUNTER — Ambulatory Visit (INDEPENDENT_AMBULATORY_CARE_PROVIDER_SITE_OTHER): Payer: Medicare Other | Admitting: *Deleted

## 2016-07-26 DIAGNOSIS — I639 Cerebral infarction, unspecified: Secondary | ICD-10-CM | POA: Diagnosis not present

## 2016-07-26 NOTE — Progress Notes (Signed)
Carelink Summary Report / Loop Recorder 

## 2016-07-28 ENCOUNTER — Telehealth: Payer: Self-pay | Admitting: Cardiology

## 2016-07-28 NOTE — Telephone Encounter (Signed)
Spoke w/ pt and requested that she send a manual transmission b/c her home monitor has not updated in at least 14 days.   

## 2016-07-30 LAB — CUP PACEART REMOTE DEVICE CHECK
Date Time Interrogation Session: 20180513064008
MDC IDC PG IMPLANT DT: 20150826

## 2016-08-13 ENCOUNTER — Encounter (INDEPENDENT_AMBULATORY_CARE_PROVIDER_SITE_OTHER): Payer: Self-pay | Admitting: Neurology

## 2016-08-13 DIAGNOSIS — I6310 Cerebral infarction due to embolism of unspecified precerebral artery: Secondary | ICD-10-CM

## 2016-08-13 NOTE — Progress Notes (Signed)
Pt came in for end of study visit. She was diagnosed with pAfib in 07/2015 and was put on Xarelto. She tolerating with Xarelto well and no recurrent stroke symptoms. She will continue Xarelto and lipitor and we will see her as needed basis.   Rosalin Hawking, MD PhD Stroke Neurology 08/13/2016 10:05 AM

## 2016-08-18 ENCOUNTER — Encounter: Payer: Self-pay | Admitting: Cardiology

## 2016-08-24 ENCOUNTER — Ambulatory Visit (INDEPENDENT_AMBULATORY_CARE_PROVIDER_SITE_OTHER): Payer: Medicare Other | Admitting: *Deleted

## 2016-08-24 DIAGNOSIS — I639 Cerebral infarction, unspecified: Secondary | ICD-10-CM

## 2016-08-24 NOTE — Progress Notes (Signed)
Carelink Summary Report / Loop Recorder 

## 2016-09-05 LAB — CUP PACEART REMOTE DEVICE CHECK
Date Time Interrogation Session: 20180612073736
MDC IDC PG IMPLANT DT: 20150826

## 2016-09-05 NOTE — Progress Notes (Signed)
Carelink summary report received. Battery status OK. Normal device function. No new symptom episodes, tachy episodes, brady, or pause episodes. No new AF episodes. Monthly summary reports and ROV/PRN 

## 2016-09-06 DIAGNOSIS — M25561 Pain in right knee: Secondary | ICD-10-CM | POA: Diagnosis not present

## 2016-09-06 DIAGNOSIS — M79671 Pain in right foot: Secondary | ICD-10-CM | POA: Diagnosis not present

## 2016-09-08 ENCOUNTER — Telehealth: Payer: Self-pay | Admitting: Cardiology

## 2016-09-08 NOTE — Telephone Encounter (Signed)
Spoke w/ pt and requested that she send a manual transmission b/c her home monitor has not updated in at least 14 days.   

## 2016-09-13 ENCOUNTER — Ambulatory Visit (INDEPENDENT_AMBULATORY_CARE_PROVIDER_SITE_OTHER): Payer: Medicare Other | Admitting: Podiatry

## 2016-09-13 ENCOUNTER — Encounter: Payer: Self-pay | Admitting: Podiatry

## 2016-09-13 ENCOUNTER — Ambulatory Visit: Payer: Medicare Other

## 2016-09-13 VITALS — BP 127/79 | HR 64 | Resp 16

## 2016-09-13 DIAGNOSIS — M779 Enthesopathy, unspecified: Secondary | ICD-10-CM

## 2016-09-13 MED ORDER — TRIAMCINOLONE ACETONIDE 10 MG/ML IJ SUSP
10.0000 mg | Freq: Once | INTRAMUSCULAR | Status: AC
  Administered 2016-09-13: 10 mg

## 2016-09-13 NOTE — Progress Notes (Signed)
Subjective:    Patient ID: Felicia Hunter, female   DOB: 76 y.o.   MRN: [id number]   HPI patient states her left foot is doing pretty well but she does have arthritis in the right foot and she had this confirmed by x-ray and has chronic tendinitis    ROS      Objective:  Physical Exam chronic tendinitis right with pain upon palpation     Assessment:    Tendinitis secondary to arthritic issues     Plan:    Sheath injection administered 3 mg Kenalog 5 mill grams Xylocaine advised on heat ice therapy and reappoint as needed

## 2016-09-23 ENCOUNTER — Ambulatory Visit (INDEPENDENT_AMBULATORY_CARE_PROVIDER_SITE_OTHER): Payer: Medicare Other | Admitting: *Deleted

## 2016-09-23 DIAGNOSIS — I639 Cerebral infarction, unspecified: Secondary | ICD-10-CM | POA: Diagnosis not present

## 2016-09-23 NOTE — Progress Notes (Signed)
Carelink Summary Report / Loop Recorder 

## 2016-09-30 LAB — CUP PACEART REMOTE DEVICE CHECK
MDC IDC PG IMPLANT DT: 20150826
MDC IDC SESS DTM: 20180712162907

## 2016-10-13 ENCOUNTER — Telehealth: Payer: Self-pay | Admitting: Cardiology

## 2016-10-13 NOTE — Telephone Encounter (Signed)
Spoke w/ pt and requested that she send a manual transmission b/c her home monitor has not updated in at least 14 days.   

## 2016-10-21 ENCOUNTER — Other Ambulatory Visit (HOSPITAL_BASED_OUTPATIENT_CLINIC_OR_DEPARTMENT_OTHER): Payer: Medicare Other

## 2016-10-21 ENCOUNTER — Ambulatory Visit (HOSPITAL_BASED_OUTPATIENT_CLINIC_OR_DEPARTMENT_OTHER): Payer: Medicare Other | Admitting: Hematology & Oncology

## 2016-10-21 VITALS — BP 122/64 | HR 63 | Temp 97.9°F | Resp 16 | Wt 142.0 lb

## 2016-10-21 DIAGNOSIS — T386X5A Adverse effect of antigonadotrophins, antiestrogens, antiandrogens, not elsewhere classified, initial encounter: Secondary | ICD-10-CM | POA: Diagnosis not present

## 2016-10-21 DIAGNOSIS — Z853 Personal history of malignant neoplasm of breast: Secondary | ICD-10-CM

## 2016-10-21 DIAGNOSIS — M810 Age-related osteoporosis without current pathological fracture: Secondary | ICD-10-CM | POA: Diagnosis not present

## 2016-10-21 DIAGNOSIS — M818 Other osteoporosis without current pathological fracture: Secondary | ICD-10-CM

## 2016-10-21 DIAGNOSIS — Z86000 Personal history of in-situ neoplasm of breast: Secondary | ICD-10-CM

## 2016-10-21 DIAGNOSIS — Z8673 Personal history of transient ischemic attack (TIA), and cerebral infarction without residual deficits: Secondary | ICD-10-CM

## 2016-10-21 LAB — CBC WITH DIFFERENTIAL (CANCER CENTER ONLY)
BASO#: 0 10*3/uL (ref 0.0–0.2)
BASO%: 0.6 % (ref 0.0–2.0)
EOS%: 1 % (ref 0.0–7.0)
Eosinophils Absolute: 0.1 10*3/uL (ref 0.0–0.5)
HCT: 43.7 % (ref 34.8–46.6)
HGB: 14.5 g/dL (ref 11.6–15.9)
LYMPH#: 1.5 10*3/uL (ref 0.9–3.3)
LYMPH%: 23.9 % (ref 14.0–48.0)
MCH: 31.9 pg (ref 26.0–34.0)
MCHC: 33.2 g/dL (ref 32.0–36.0)
MCV: 96 fL (ref 81–101)
MONO#: 0.5 10*3/uL (ref 0.1–0.9)
MONO%: 8.2 % (ref 0.0–13.0)
NEUT#: 4.1 10*3/uL (ref 1.5–6.5)
NEUT%: 66.3 % (ref 39.6–80.0)
PLATELETS: 219 10*3/uL (ref 145–400)
RBC: 4.55 10*6/uL (ref 3.70–5.32)
RDW: 13.2 % (ref 11.1–15.7)
WBC: 6.2 10*3/uL (ref 3.9–10.0)

## 2016-10-21 LAB — CMP (CANCER CENTER ONLY)
ALT: 24 U/L (ref 10–47)
AST: 24 U/L (ref 11–38)
Albumin: 4 g/dL (ref 3.3–5.5)
Alkaline Phosphatase: 40 U/L (ref 26–84)
BUN: 16 mg/dL (ref 7–22)
CHLORIDE: 106 meq/L (ref 98–108)
CO2: 26 mEq/L (ref 18–33)
CREATININE: 0.9 mg/dL (ref 0.6–1.2)
Calcium: 9.4 mg/dL (ref 8.0–10.3)
GLUCOSE: 115 mg/dL (ref 73–118)
Potassium: 4.1 mEq/L (ref 3.3–4.7)
SODIUM: 139 meq/L (ref 128–145)
TOTAL PROTEIN: 6.8 g/dL (ref 6.4–8.1)
Total Bilirubin: 2 mg/dl — ABNORMAL HIGH (ref 0.20–1.60)

## 2016-10-21 NOTE — Progress Notes (Signed)
Hematology and Oncology Follow Up Visit  KINDELL STRADA [id number] [birth year] 76 y.o. 10/21/2016   Principle Diagnosis:   DCIS of the right breast  Cortical stroke of the left MCA-no residual deficit  Current Therapy:    Clinical trial for the CVA     Interim History:  Felicia Hunter is back for follow-up. She looks great. She told me all about her trip to [place]. She went with her church group. The had a wonderful time. It went to see quite a few cities. She ate some good [place] food. I think she maybe had a low bit of [place] whiskey.  Her health is doing okay. She is on Xarelto because of the past history of stroke.  She's had no problems with fever. She's had no infections. She does have the vitiligo which she really hates having.  She is trying to stay active. She does have some osteoporosis. She is on Fosamax.  There is been no shortness of breath. She's had no change in bowel or bladder habits.    Overall, her performance status is ECOG 1.    Medications:  Current Outpatient Prescriptions:  .  alendronate (FOSAMAX) 70 MG tablet, once a week. Take as directed, Disp: , Rfl:  .  atorvastatin (LIPITOR) 20 MG tablet, Take 20 mg by mouth every evening. , Disp: , Rfl:  .  BIOTIN PO, Take 1 tablet by mouth daily., Disp: , Rfl:  .  calcium carbonate (CALCIUM 600) 600 MG TABS, Take 600 mg by mouth daily. , Disp: , Rfl:  .  cholecalciferol (VITAMIN D) 1000 UNITS tablet, Take 1,000 Units by mouth daily., Disp: , Rfl:  .  Multiple Vitamins-Minerals (MULTIVITAMIN & MINERAL PO), Take 1 capsule by mouth as directed. Only takes occasionally, Disp: , Rfl:  .  omega-3 acid ethyl esters (LOVAZA) 1 G capsule, Take 1 g by mouth 2 (two) times daily. , Disp: , Rfl:  .  polyvinyl alcohol (LIQUIFILM TEARS) 1.4 % ophthalmic solution, Place 1 drop into both eyes as needed (Use as directed). , Disp: , Rfl:  .  rivaroxaban (XARELTO) 20 MG TABS tablet, Take 1 tablet (20 mg total) by mouth daily with  supper., Disp: 90 tablet, Rfl: 3 .  vitamin C (ASCORBIC ACID) 500 MG tablet, Take 500 mg by mouth as needed (Cold symptoms). Take as directed, Disp: , Rfl:  .  zolpidem (AMBIEN) 10 MG tablet, Take 10 mg by mouth at bedtime as needed for sleep. , Disp: , Rfl:   Current Facility-Administered Medications:  .  0.9 %  sodium chloride infusion, 500 mL, Intravenous, Continuous, Lucio Edward T, MD .  0.9 %  sodium chloride infusion, 500 mL, Intravenous, Continuous, Ladene Artist, MD  Allergies:  Allergies  Allergen Reactions  . Sulfonamide Derivatives Rash    Past Medical History, Surgical history, Social history, and Family History were reviewed and updated.  Review of Systems: As above  Physical Exam:  weight is 142 lb (64.4 kg). Her oral temperature is 97.9 F (36.6 C). Her blood pressure is 122/64 and her pulse is 63. Her respiration is 16 and oxygen saturation is 95%.   Wt Readings from Last 3 Encounters:  10/21/16 142 lb (64.4 kg)  05/03/16 145 lb 3.2 oz (65.9 kg)  04/09/16 144 lb (65.3 kg)     Well-developed and well-nourished white female in no obvious distress. Head and neck exam shows no ocular or oral lesions. She has no palpable cervical or supraclavicular lymph nodes. Lungs  are clear. Cardiac exam regular rate and rhythm with no murmurs, rubs or bruits. Chest wall exam shows bilateral mastectomies. She has no chest wall nodules. There is no erythema on the chest wall. There is no bilateral axillary adenopathy. Abdomen is soft. She has good bowel sounds. There is no fluid wave. There is no palpable liver or spleen tip. Back exam shows no kyphosis. There is no tenderness over the spine, ribs or hips. Extremities shows no clubbing, cyanosis or edema. She has some age related osteoarthritic changes. She has good range of motion of her joints. She has good strength in her extremities. Skin exam shows no rashes, ecchymoses or petechia.She does have areas of vitiligo. Neurological  exam shows no focal neurological deficits.  Lab Results  Component Value Date   WBC 6.2 10/21/2016   HGB 14.5 10/21/2016   HCT 43.7 10/21/2016   MCV 96 10/21/2016   PLT 219 10/21/2016     Chemistry      Component Value Date/Time   NA 139 10/21/2016 1335   K 4.1 10/21/2016 1335   CL 106 10/21/2016 1335   CO2 26 10/21/2016 1335   BUN 16 10/21/2016 1335   CREATININE 0.9 10/21/2016 1335      Component Value Date/Time   CALCIUM 9.4 10/21/2016 1335   ALKPHOS 40 10/21/2016 1335   AST 24 10/21/2016 1335   ALT 24 10/21/2016 1335   BILITOT 2.00 (H) 10/21/2016 1335         Impression and Plan: Felicia Hunter is 76 year old white female. She has a past history of ductal carcinoma in situ of the right breast. She is doing well. She was diagnosed 7 years ago. Thankfully, she was not on any type of anti-estrogen (i.e. tamoxifen) when she had this CVA.  I'm so glad that she had a great time in [place]. It sounds like she really enjoyed herself.  From my point of view, I don't think we have to do anything different with Mrs. Plass.  We will see her back in one year. I am sure that her next trip will be just as fun.  Volanda Napoleon, MD 8/9/20182:14 PM

## 2016-10-22 LAB — VITAMIN D 25 HYDROXY (VIT D DEFICIENCY, FRACTURES): VIT D 25 HYDROXY: 40.7 ng/mL (ref 30.0–100.0)

## 2016-10-25 ENCOUNTER — Ambulatory Visit (INDEPENDENT_AMBULATORY_CARE_PROVIDER_SITE_OTHER): Payer: Medicare Other | Admitting: *Deleted

## 2016-10-25 DIAGNOSIS — I6310 Cerebral infarction due to embolism of unspecified precerebral artery: Secondary | ICD-10-CM | POA: Diagnosis not present

## 2016-10-30 LAB — CUP PACEART REMOTE DEVICE CHECK
MDC IDC PG IMPLANT DT: 20150826
MDC IDC SESS DTM: 20180811164305

## 2016-10-30 NOTE — Progress Notes (Signed)
Carelink summary report received. Battery status OK. Normal device function. No new symptom episodes, tachy episodes, brady, or pause episodes. No new AF episodes. Monthly summary reports and ROV/PRN 

## 2016-11-03 ENCOUNTER — Telehealth: Payer: Self-pay | Admitting: Cardiology

## 2016-11-03 NOTE — Telephone Encounter (Signed)
Spoke w/ pt and requested that she send a manual transmission b/c her home monitor has not updated in at least 14 days.   

## 2016-11-09 DIAGNOSIS — J069 Acute upper respiratory infection, unspecified: Secondary | ICD-10-CM | POA: Diagnosis not present

## 2016-11-09 DIAGNOSIS — R05 Cough: Secondary | ICD-10-CM | POA: Diagnosis not present

## 2016-11-17 ENCOUNTER — Telehealth: Payer: Self-pay | Admitting: Cardiology

## 2016-11-17 NOTE — Telephone Encounter (Signed)
Spoke w/ pt and requested that she send a manual transmission b/c her home monitor has not updated in at least 14 days.   

## 2016-11-22 ENCOUNTER — Ambulatory Visit (INDEPENDENT_AMBULATORY_CARE_PROVIDER_SITE_OTHER): Payer: Medicare Other | Admitting: *Deleted

## 2016-11-22 DIAGNOSIS — I6310 Cerebral infarction due to embolism of unspecified precerebral artery: Secondary | ICD-10-CM

## 2016-11-23 NOTE — Progress Notes (Signed)
Carelink Summary Report / Loop Recorder 

## 2016-11-24 LAB — CUP PACEART REMOTE DEVICE CHECK
Date Time Interrogation Session: 20180910213806
Implantable Pulse Generator Implant Date: 20150826

## 2016-12-22 ENCOUNTER — Ambulatory Visit (INDEPENDENT_AMBULATORY_CARE_PROVIDER_SITE_OTHER): Payer: Medicare Other | Admitting: *Deleted

## 2016-12-22 DIAGNOSIS — I6310 Cerebral infarction due to embolism of unspecified precerebral artery: Secondary | ICD-10-CM

## 2016-12-23 NOTE — Progress Notes (Signed)
Carelink Summary Report / Loop Recorder 

## 2016-12-24 DIAGNOSIS — Z23 Encounter for immunization: Secondary | ICD-10-CM | POA: Diagnosis not present

## 2016-12-28 LAB — CUP PACEART REMOTE DEVICE CHECK
Date Time Interrogation Session: 20181010220735
MDC IDC PG IMPLANT DT: 20150826

## 2017-01-21 ENCOUNTER — Ambulatory Visit (INDEPENDENT_AMBULATORY_CARE_PROVIDER_SITE_OTHER): Payer: Medicare Other | Admitting: *Deleted

## 2017-01-21 DIAGNOSIS — I6310 Cerebral infarction due to embolism of unspecified precerebral artery: Secondary | ICD-10-CM

## 2017-01-24 NOTE — Progress Notes (Signed)
Carelink Summary Report / Loop Recorder 

## 2017-01-27 LAB — CUP PACEART REMOTE DEVICE CHECK
Date Time Interrogation Session: 20181109231142
MDC IDC PG IMPLANT DT: 20150826

## 2017-02-05 ENCOUNTER — Ambulatory Visit (HOSPITAL_COMMUNITY)
Admission: EM | Admit: 2017-02-05 | Discharge: 2017-02-05 | Disposition: A | Discharge status: Home / Self Care | Payer: Medicare Other | Attending: Emergency Medicine | Admitting: Emergency Medicine

## 2017-02-05 ENCOUNTER — Other Ambulatory Visit: Payer: Self-pay

## 2017-02-05 ENCOUNTER — Encounter (HOSPITAL_COMMUNITY): Payer: Self-pay | Admitting: Emergency Medicine

## 2017-02-05 DIAGNOSIS — G8929 Other chronic pain: Secondary | ICD-10-CM

## 2017-02-05 DIAGNOSIS — M7121 Synovial cyst of popliteal space [Baker], right knee: Secondary | ICD-10-CM

## 2017-02-05 DIAGNOSIS — M25561 Pain in right knee: Secondary | ICD-10-CM

## 2017-02-05 NOTE — ED Provider Notes (Signed)
[place]    CSN: [id number] Arrival date & time: 02/05/17  1322     History   Chief Complaint Chief Complaint  Patient presents with  . Knee Pain    HPI Felicia Hunter is a 76 y.o. female.   HPI   She is presenting today with complaints of right knee pain.  Patient states that she was diagnosed with a Baker's cyst approximately 1 year ago that she did not have treated.  Patient states that she "drank a lot of water" and felt like it just "got better".  Patient has a history of osteoarthritis in both knees with a total left knee replacement.  Patient states that her orthopedist is Dr. Alvan Dame and that she came here today in hopes that she could get an injection or have it drained as she was going on a bus trip in a few days.  Past Medical History:  Diagnosis Date  . Alopecia   . Arthritis   . Atrial fibrillation ([place])   . Breast cancer ([place])    double mastectomy 2011  . Clavicle fracture    right clavicle-hardware retained  . Clotting disorder ([place])    with CVA- 2013, 2014  . Cough    PERSISTANT COUGH X 20 YRS  . Difficulty sleeping    takes Ambien  . Disorders of bilirubin excretion   . Diverticulosis   . Environmental and seasonal allergies    "dry cough", related to ? allergies  . GERD (gastroesophageal reflux disease)   . Hyperlipidemia   . MVP (mitral valve prolapse)   . Osteopenia   . Osteoporosis   . Stroke ([place]) 06/07/11   x2 episodes-"funny feeling"" equilibrium off x24 hours, x1 speech affected- no residuallast 1 yr ago.  . Tubulovillous adenoma of colon 08/2003    Patient Active Problem List   Diagnosis Date Noted  . Right leg pain 03/01/2016  . S/P left THA, AA 12/31/2014  . Cerebral infarction due to embolism of precerebral artery ([place]) 12/20/2013  . History of stroke 12/20/2013  . Expressive aphasia 11/05/2013  . Expected blood loss anemia 04/11/2013  . Hyponatremia 04/11/2013  . Other and unspecified hyperlipidemia 06/15/2012  .  Lacunar infarction 06/15/2012  . CVA (cerebral infarction)-mid/inf cerebellar infarct 3/25 06/08/2011  . Near syncope 06/07/2011  . 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 DEPRESSION 10/28/2006  . ALLERGIC RHINITIS 10/28/2006  . ASTHMA 10/28/2006    Past Surgical History:  Procedure Laterality Date  . ABDOMINAL HYSTERECTOMY  1993  . BREAST CAPSULOTOMY     after bilat mastectomies-implants removed  . BREAST ENHANCEMENT SURGERY     x3-then removed 2011 for bilat mastectomies  . BREAST SURGERY  june 2011   double mastectomy nodes from rt  . CHEST WALL TUMOR EXCISION  5/12   noduals removed-neg  . collarbone  september 2011   pinned and plated, right  . COLONOSCOPY    . JOINT REPLACEMENT  july 9th, 2012   left knee replacement   . KNEE RECONSTRUCTION  7/12   revision lt total knee  . LOOP RECORDER IMPLANT N/A 11/07/2013   Procedure: LOOP RECORDER IMPLANT;  Surgeon: Evans Lance, MD;  [facility] C[facility] LAB;  Service: Cardiovascular;  Laterality: N/A;  . MASS EXCISION Left 06/19/2012   Procedure: EXCISION OF LARGE MASS LEFT NECK/SHOULDER WITH PLASTIC CLOSURE;  Surgeon: Cristine Polio, MD;  Location: [place];  Service: Plastics;  Laterality: Left;  . NASAL SEPTUM  SURGERY    . TEE WITHOUT CARDIOVERSION N/A 11/07/2013   Procedure: TRANSESOPHAGEAL ECHOCARDIOGRAM (TEE);  Surgeon: Dorothy Spark, MD;  Location: [place];  Service: Cardiovascular;  Laterality: N/A;  . TONSILLECTOMY    . TOTAL HIP ARTHROPLASTY Right 04/10/2013   Procedure: RIGHT TOTAL HIP ARTHROPLASTY ANTERIOR APPROACH;  Surgeon: [name], MD;  Location: [facility];  Service: Orthopedics;  Laterality: Right;  . TOTAL HIP ARTHROPLASTY Left 12/31/2014   Procedure: LEFT TOTAL HIP ARTHROPLASTY ANTERIOR APPROACH;  Surgeon: Paralee Cancel, MD;  Location: [facility];  Service: Orthopedics;  Laterality: Left;  . UPPER GASTROINTESTINAL ENDOSCOPY    . VEIN SURGERY  1974   left leg    OB  History    No data available       Home Medications    Prior to Admission medications   Medication Sig Start Date End Date Taking? Authorizing Provider  alendronate (FOSAMAX) 70 MG tablet once a week. Take as directed 11/24/15   [provider]  atorvastatin (LIPITOR) 20 MG tablet Take 20 mg by mouth every evening.  06/07/12   [provider]  BIOTIN PO Take 1 tablet by mouth daily.    [provider]  calcium carbonate (CALCIUM 600) 600 MG TABS Take 600 mg by mouth daily.     [provider]  cholecalciferol (VITAMIN D) 1000 UNITS tablet Take 1,000 Units by mouth daily.    [provider]  Multiple Vitamins-Minerals (MULTIVITAMIN & MINERAL PO) Take 1 capsule by mouth as directed. Only takes occasionally    [provider]  omega-3 acid ethyl esters (LOVAZA) 1 G capsule Take 1 g by mouth 2 (two) times daily.     [provider]  polyvinyl alcohol (LIQUIFILM TEARS) 1.4 % ophthalmic solution Place 1 drop into both eyes as needed (Use as directed).     [provider]  rivaroxaban (XARELTO) 20 MG TABS tablet Take 1 tablet (20 mg total) by mouth daily with supper. 05/03/16   Evans Lance, MD  vitamin C (ASCORBIC ACID) 500 MG tablet Take 500 mg by mouth as needed (Cold symptoms). Take as directed    [provider]  zolpidem (AMBIEN) 10 MG tablet Take 10 mg by mouth at bedtime as needed for sleep.  01/05/16   [provider]    Family History Family History  Problem Relation Age of Onset  . Aneurysm Father   . Colon cancer Neg Hx   . Esophageal cancer Neg Hx   . Rectal cancer Neg Hx   . Stomach cancer Neg Hx     Social History Social History   Tobacco Use  . Smoking status: Never Smoker  . Smokeless tobacco: Never Used  Substance Use Topics  . Alcohol use: Yes    Alcohol/week: 0.6 oz    Types: 1 Glasses of wine per week    Comment: 1 glass of wine per night  . Drug use: No      Allergies   Sulfonamide derivatives   Review of Systems Review of Systems  Constitutional: Negative.  Negative for chills, fatigue and fever.  HENT: Negative.   Eyes: Negative.  Negative for pain and visual disturbance.  Respiratory: Negative.  Negative for cough and shortness of breath.   Cardiovascular: Negative.  Negative for chest pain and leg swelling.  Gastrointestinal: Negative.   Endocrine: Negative.   Genitourinary: Negative.  Negative for dysuria and hematuria.  Musculoskeletal: Positive for joint swelling. Negative for arthralgias, back pain,  gait problem and neck stiffness.  Skin: Negative.  Negative for color change and rash.  Allergic/Immunologic: Negative.   Neurological: Negative.  Negative for dizziness, seizures, syncope and headaches.  Hematological: Negative.   Psychiatric/Behavioral: Negative.   All other systems reviewed and are negative.    Physical Exam Triage Vital Signs ED Triage Vitals  Enc Vitals Group     BP 02/05/17 1420 (!) 148/70     Pulse Rate 02/05/17 1420 69     Resp 02/05/17 1420 20     Temp 02/05/17 1420 98 F (36.7 C)     Temp Source 02/05/17 1420 Oral     SpO2 02/05/17 1420 98 %     Weight --      Height --      Head Circumference --      Peak Flow --      Pain Score 02/05/17 1417 3     Pain Loc --      Pain Edu? --      Excl. in [place]? --    No data found.  Updated Vital Signs BP (!) 148/70 (BP Location: Left Arm)   Pulse 69   Temp 98 F (36.7 C) (Oral)   Resp 20   SpO2 98%   Visual Acuity Right Eye Distance:   Left Eye Distance:   Bilateral Distance:    Right Eye Near:   Left Eye Near:    Bilateral Near:     Physical Exam  Constitutional: She is oriented to person, place, and time. She appears well-developed and well-nourished. No distress.  Eyes: Conjunctivae are normal. Pupils are equal, round, and reactive to light. Right eye exhibits no discharge. Left eye exhibits no discharge. No scleral icterus.   Neck: Normal range of motion. Neck supple. No thyromegaly present.  Cardiovascular: Normal rate, regular rhythm, normal heart sounds and intact distal pulses. Exam reveals no gallop and no friction rub.  No murmur heard. Pulmonary/Chest: Effort normal and breath sounds normal. No respiratory distress. She has no wheezes. She has no rales. She exhibits no tenderness.  Musculoskeletal: Normal range of motion. She exhibits edema. She exhibits no tenderness or deformity.  The patient is able to ambulate to treatment area without assistance and is able to get up and down from exam table independently.  No acute distress.  No surface trauma or bruising noted.   Swollen popliteal area more firm and pronounced with standing.  No erythema or warmth, but appreciable visible swelling present.  There is obvious asymmetry of the right as compared to the left secondary to popliteal edema. No inguinal lymphadenopathy, ROM unlimited and without pain.  Normal flexion to chest (135 degrees), extension (30 degrees), abduction (45 degrees), adduction (across midline, internal/external rotation (45 degrees). Distal motor and neurovascular status is intact.  Crepitus noted with active ROM.    Neurological: She is alert and oriented to person, place, and time.  Skin: Skin is warm and dry. She is not diaphoretic.  Nursing note and vitals reviewed.    UC Treatments / Results  Labs (all labs ordered are listed, but only abnormal results are displayed) Labs Reviewed - No data to display  EKG  EKG Interpretation None       Radiology No results found.  Procedures Procedures (including critical care time)  Medications Ordered in UC Medications - No data to display   Initial Impression / Assessment and Plan / UC Course  I have reviewed the triage vital signs and the nursing notes.  Pertinent labs & imaging results that were available during my care of the patient were reviewed by me and considered in my  medical decision making (see chart for details).     Discussed with patient the probable plan of care would be drainage and possible glucocorticoid injection of the joint.  Patient agrees and understands that she is to follow-up with an orthopedist.  Informed patient if she is unable to be seen by Dr. Alvan Dame that she could potentially visit the [facility] across the street.    Final Clinical Impressions(s) / UC Diagnoses   Final diagnoses:  Chronic pain of right knee  Baker's cyst of knee, right    ED Discharge Orders    None      Controlled Substance Prescriptions [place] Controlled Substance Registry consulted? No   Patient refuses sleeve or knee immobilizer.  In addition patient states that over-the-counter pain medication is sufficient at this time. The usual and customary discharge instructions and warnings were given.  The patient verbalizes understanding and agrees to plan of care.      Nehemiah Settle, NP 02/05/17 1556

## 2017-02-05 NOTE — Discharge Instructions (Signed)
Follow up with Dr. Alvan Dame or Orthopedist of your choice.  Rest and take tylenol or ibuprofen for discomfort.

## 2017-02-05 NOTE — ED Triage Notes (Signed)
Right knee pain, patient says she is concerned for a "bakers cyst" per patient .  Noticed 2 weeks ago.

## 2017-02-09 DIAGNOSIS — H43813 Vitreous degeneration, bilateral: Secondary | ICD-10-CM | POA: Diagnosis not present

## 2017-02-09 DIAGNOSIS — H25813 Combined forms of age-related cataract, bilateral: Secondary | ICD-10-CM | POA: Diagnosis not present

## 2017-02-09 DIAGNOSIS — H5213 Myopia, bilateral: Secondary | ICD-10-CM | POA: Diagnosis not present

## 2017-02-10 DIAGNOSIS — M1711 Unilateral primary osteoarthritis, right knee: Secondary | ICD-10-CM | POA: Diagnosis not present

## 2017-02-10 DIAGNOSIS — M25561 Pain in right knee: Secondary | ICD-10-CM | POA: Diagnosis not present

## 2017-02-21 ENCOUNTER — Ambulatory Visit (INDEPENDENT_AMBULATORY_CARE_PROVIDER_SITE_OTHER): Payer: Medicare Other | Admitting: *Deleted

## 2017-02-21 DIAGNOSIS — I6310 Cerebral infarction due to embolism of unspecified precerebral artery: Secondary | ICD-10-CM

## 2017-02-21 NOTE — Progress Notes (Signed)
Carelink Summary Report / Loop Recorder 

## 2017-02-24 ENCOUNTER — Telehealth: Payer: Self-pay | Admitting: *Deleted

## 2017-02-24 DIAGNOSIS — Z0000 Encounter for general adult medical examination without abnormal findings: Secondary | ICD-10-CM | POA: Diagnosis not present

## 2017-02-24 DIAGNOSIS — E559 Vitamin D deficiency, unspecified: Secondary | ICD-10-CM | POA: Diagnosis not present

## 2017-02-24 DIAGNOSIS — I10 Essential (primary) hypertension: Secondary | ICD-10-CM | POA: Diagnosis not present

## 2017-02-24 NOTE — Telephone Encounter (Signed)
Spoke with patient, advised of LINQ at [facility] (as of 02/12/17).  Patient is due for 52yrf/u with Dr. TLovena Lein February.  She is agreeable to scheduling an appointment with him on 04/26/17 at 1:45pm for f/u and to discuss plan for loop recorder.  Patient is agreeable to receiving [facility] monitor return kit and is aware that we will discontinue home monitoring.  Patient is appreciative of call and denies additional questions or concerns at this time.

## 2017-02-28 LAB — CUP PACEART REMOTE DEVICE CHECK
Implantable Pulse Generator Implant Date: 20150826
MDC IDC SESS DTM: 20181209233907

## 2017-03-01 DIAGNOSIS — K5790 Diverticulosis of intestine, part unspecified, without perforation or abscess without bleeding: Secondary | ICD-10-CM | POA: Diagnosis not present

## 2017-03-01 DIAGNOSIS — K219 Gastro-esophageal reflux disease without esophagitis: Secondary | ICD-10-CM | POA: Diagnosis not present

## 2017-03-01 DIAGNOSIS — L659 Nonscarring hair loss, unspecified: Secondary | ICD-10-CM | POA: Diagnosis not present

## 2017-03-01 DIAGNOSIS — I341 Nonrheumatic mitral (valve) prolapse: Secondary | ICD-10-CM | POA: Diagnosis not present

## 2017-03-01 DIAGNOSIS — M159 Polyosteoarthritis, unspecified: Secondary | ICD-10-CM | POA: Diagnosis not present

## 2017-03-01 DIAGNOSIS — I8390 Asymptomatic varicose veins of unspecified lower extremity: Secondary | ICD-10-CM | POA: Diagnosis not present

## 2017-03-01 DIAGNOSIS — L80 Vitiligo: Secondary | ICD-10-CM | POA: Diagnosis not present

## 2017-03-01 DIAGNOSIS — E7800 Pure hypercholesterolemia, unspecified: Secondary | ICD-10-CM | POA: Diagnosis not present

## 2017-03-01 DIAGNOSIS — R05 Cough: Secondary | ICD-10-CM | POA: Diagnosis not present

## 2017-03-01 DIAGNOSIS — R9431 Abnormal electrocardiogram [ECG] [EKG]: Secondary | ICD-10-CM | POA: Diagnosis not present

## 2017-03-01 DIAGNOSIS — M810 Age-related osteoporosis without current pathological fracture: Secondary | ICD-10-CM | POA: Diagnosis not present

## 2017-04-20 ENCOUNTER — Encounter: Payer: Self-pay | Admitting: Internal Medicine

## 2017-04-26 ENCOUNTER — Encounter: Payer: Self-pay | Admitting: Internal Medicine

## 2017-04-26 ENCOUNTER — Ambulatory Visit (INDEPENDENT_AMBULATORY_CARE_PROVIDER_SITE_OTHER): Payer: Medicare Other | Admitting: Internal Medicine

## 2017-04-26 VITALS — BP 132/66 | HR 84 | Ht 65.0 in | Wt 141.0 lb

## 2017-04-26 DIAGNOSIS — I480 Paroxysmal atrial fibrillation: Secondary | ICD-10-CM | POA: Diagnosis not present

## 2017-04-26 DIAGNOSIS — Z8673 Personal history of transient ischemic attack (TIA), and cerebral infarction without residual deficits: Secondary | ICD-10-CM | POA: Diagnosis not present

## 2017-04-26 LAB — CUP PACEART INCLINIC DEVICE CHECK
Implantable Pulse Generator Implant Date: 20150826
MDC IDC SESS DTM: 20190212161205

## 2017-04-26 NOTE — Progress Notes (Signed)
HPI Felicia Hunter returns today for followup. She has a h/o cryptogenic stroke, underwent insertion of an ILR, and was found to have had atrial fib. She has been placed on xarelto. She feels well. She denies chest pain or sob. No palpitations.  Allergies  Allergen Reactions  . Sulfonamide Derivatives Rash     Current Outpatient Medications  Medication Sig Dispense Refill  . alendronate (FOSAMAX) 70 MG tablet once a week. Take as directed    . atorvastatin (LIPITOR) 20 MG tablet Take 20 mg by mouth every evening.     Marland Kitchen BIOTIN PO Take 1 tablet by mouth daily.    . calcium carbonate (CALCIUM 600) 600 MG TABS Take 600 mg by mouth daily.     . cholecalciferol (VITAMIN D) 1000 UNITS tablet Take 1,000 Units by mouth daily.    . Multiple Vitamins-Minerals (MULTIVITAMIN & MINERAL PO) Take 1 capsule by mouth as directed. Only takes occasionally    . omega-3 acid ethyl esters (LOVAZA) 1 G capsule Take 1 g by mouth 2 (two) times daily.     . polyvinyl alcohol (LIQUIFILM TEARS) 1.4 % ophthalmic solution Place 1 drop into both eyes as needed (Use as directed).     . rivaroxaban (XARELTO) 20 MG TABS tablet Take 1 tablet (20 mg total) by mouth daily with supper. 90 tablet 3  . vitamin C (ASCORBIC ACID) 500 MG tablet Take 500 mg by mouth as needed (Cold symptoms). Take as directed    . zolpidem (AMBIEN) 10 MG tablet Take 10 mg by mouth at bedtime as needed for sleep.      Current Facility-Administered Medications  Medication Dose Route Frequency Provider Last Rate Last Dose  . 0.9 %  sodium chloride infusion  500 mL Intravenous Continuous Lucio Edward T, MD      . 0.9 %  sodium chloride infusion  500 mL Intravenous Continuous Ladene Artist, MD         Past Medical History:  Diagnosis Date  . Alopecia   . Arthritis   . Atrial fibrillation ([place])   . Breast cancer ([place])    double mastectomy 2011  . Clavicle fracture    right clavicle-hardware retained  . Clotting disorder ([place])    with CVA- 2013, 2014  . Cough    PERSISTANT COUGH X 20 YRS  . Difficulty sleeping    takes Ambien  . Disorders of bilirubin excretion   . Diverticulosis   . Environmental and seasonal allergies    "dry cough", related to ? allergies  . GERD (gastroesophageal reflux disease)   . Hyperlipidemia   . MVP (mitral valve prolapse)   . Osteopenia   . Osteoporosis   . Stroke ([place]) 06/07/11   x2 episodes-"funny feeling"" equilibrium off x24 hours, x1 speech affected- no residuallast 1 yr ago.  . Tubulovillous adenoma of colon 08/2003    ROS:   All systems reviewed and negative except as noted in the HPI.   Past Surgical History:  Procedure Laterality Date  . ABDOMINAL HYSTERECTOMY  1993  . BREAST CAPSULOTOMY     after bilat mastectomies-implants removed  . BREAST ENHANCEMENT SURGERY     x3-then removed 2011 for bilat mastectomies  . BREAST SURGERY  june 2011   double mastectomy nodes from rt  . CHEST WALL TUMOR EXCISION  5/12   noduals removed-neg  . collarbone  september 2011   pinned and plated, right  . COLONOSCOPY    . JOINT REPLACEMENT  july 9th, 2012   left knee replacement   . KNEE RECONSTRUCTION  7/12   revision lt total knee  . LOOP RECORDER IMPLANT N/A 11/07/2013   Procedure: LOOP RECORDER IMPLANT;  Surgeon: Evans Lance, MD;  Location: [facility] CATH LAB;  Service: Cardiovascular;  Laterality: N/A;  . MASS EXCISION Left 06/19/2012   Procedure: EXCISION OF LARGE MASS LEFT NECK/SHOULDER WITH PLASTIC CLOSURE;  Surgeon: Cristine Polio, MD;  Location: [place];  Service: Plastics;  Laterality: Left;  . NASAL SEPTUM SURGERY    . TEE WITHOUT CARDIOVERSION N/A 11/07/2013   Procedure: TRANSESOPHAGEAL ECHOCARDIOGRAM (TEE);  Surgeon: Dorothy Spark, MD;  Location: [place];  Service: Cardiovascular;  Laterality: N/A;  . TONSILLECTOMY    . TOTAL HIP ARTHROPLASTY Right 04/10/2013   Procedure: RIGHT TOTAL HIP ARTHROPLASTY ANTERIOR APPROACH;  Surgeon: [name], MD;  [facility];  Service: Orthopedics;  Laterality: Right;  . TOTAL HIP ARTHROPLASTY Left 12/31/2014   Procedure: LEFT TOTAL HIP ARTHROPLASTY ANTERIOR APPROACH;  Surgeon: Paralee Cancel, MD;  [facility];  Service: Orthopedics;  Laterality: Left;  . UPPER GASTROINTESTINAL ENDOSCOPY    . VEIN SURGERY  1974   left leg     Family History  Problem Relation Age of Onset  . Aneurysm Father   . Colon cancer Neg Hx   . Esophageal cancer Neg Hx   . Rectal cancer Neg Hx   . Stomach cancer Neg Hx      Social History   Socioeconomic History  . Marital status: Widowed    Spouse name: Not on file  . Number of children: 2  . Years of education: college  . Highest education level: Not on file  Social Needs  . Financial resource strain: Not on file  . Food insecurity - worry: Not on file  . Food insecurity - inability: Not on file  . Transportation needs - medical: Not on file  . Transportation needs - non-medical: Not on file  Occupational History  . Occupation: retired    Fish farm manager: Retired    Fish farm manager: RETIRED  Tobacco Use  . Smoking status: Never Smoker  . Smokeless tobacco: Never Used  Substance and Sexual Activity  . Alcohol use: Yes    Alcohol/week: 0.6 oz    Types: 1 Glasses of wine per week    Comment: 1 glass of wine per night  . Drug use: No  . Sexual activity: Not on file  Other Topics Concern  . Not on file  Social History Narrative   Patient is single with 2 children.    Patient is left handed.   Patient has college education.   Patient drinks coffee daily     BP 132/66   Pulse 84   Ht 5\' 5"  (1.651 m)   Wt 141 lb (64 kg)   BMI 23.46 kg/m   Physical Exam:  Well appearing 77 yo woman, NAD HEENT: Unremarkable Neck:  6 cm JVD, no thyromegally Lymphatics:  No adenopathy Back:  No CVA tenderness Lungs:  Clear with no wheezes HEART:  Regular rate rhythm, no murmurs, no rubs, no clicks Abd:  soft, positive bowel sounds, no organomegally, no  rebound, no guarding Ext:  2 plus pulses, no edema, no cyanosis, no clubbing Skin:  No rashes no nodules Neuro:  CN II through XII intact, motor grossly intact  EKG - none  DEVICE  Normal device function.  See PaceArt for details. EOS.  Assess/Plan: 1. Cryptogenic stroke -  she has had no more since being found to have atrial fib and being placed on Xarelto. 2. ILR - her device is at end of service. She would like to have it removed. We will schedule. 3. Dyslipidemia - she will continue her statin.  Mikle [email or web address].D.

## 2017-04-26 NOTE — Patient Instructions (Signed)
Medication Instructions:  Your physician recommends that you continue on your current medications as directed. Please refer to the Current Medication list given to you today.  Labwork: None ordered.  Testing/Procedures: You will have your loop recorder removed.   Arrive at Admitting at the [facility] at 6:30 am on May 16, 2017.  Follow-Up:  You will follow up with the device clinic 10-14 days after your loop removal for a wound check.  Your physician wants you to follow-up as needed with Dr. Lovena Le.  Any Other Special Instructions Will Be Listed Below (If Applicable).  There are no special instructions. You may have breakfast. You may take your normal AM medications. You may drive yourself home after the procedure.   If you need a refill on your cardiac medications before your next appointment, please call your pharmacy.

## 2017-04-26 NOTE — H&P (View-Only) (Signed)
HPI Mrs. Mathias returns today for followup. She has a h/o cryptogenic stroke, underwent insertion of an ILR, and was found to have had atrial fib. She has been placed on xarelto. She feels well. She denies chest pain or sob. No palpitations.  Allergies  Allergen Reactions  . Sulfonamide Derivatives Rash     Current Outpatient Medications  Medication Sig Dispense Refill  . alendronate (FOSAMAX) 70 MG tablet once a week. Take as directed    . atorvastatin (LIPITOR) 20 MG tablet Take 20 mg by mouth every evening.     Marland Kitchen BIOTIN PO Take 1 tablet by mouth daily.    . calcium carbonate (CALCIUM 600) 600 MG TABS Take 600 mg by mouth daily.     . cholecalciferol (VITAMIN D) 1000 UNITS tablet Take 1,000 Units by mouth daily.    . Multiple Vitamins-Minerals (MULTIVITAMIN & MINERAL PO) Take 1 capsule by mouth as directed. Only takes occasionally    . omega-3 acid ethyl esters (LOVAZA) 1 G capsule Take 1 g by mouth 2 (two) times daily.     . polyvinyl alcohol (LIQUIFILM TEARS) 1.4 % ophthalmic solution Place 1 drop into both eyes as needed (Use as directed).     . rivaroxaban (XARELTO) 20 MG TABS tablet Take 1 tablet (20 mg total) by mouth daily with supper. 90 tablet 3  . vitamin C (ASCORBIC ACID) 500 MG tablet Take 500 mg by mouth as needed (Cold symptoms). Take as directed    . zolpidem (AMBIEN) 10 MG tablet Take 10 mg by mouth at bedtime as needed for sleep.      Current Facility-Administered Medications  Medication Dose Route Frequency Provider Last Rate Last Dose  . 0.9 %  sodium chloride infusion  500 mL Intravenous Continuous Lucio Edward T, MD      . 0.9 %  sodium chloride infusion  500 mL Intravenous Continuous Ladene Artist, MD         Past Medical History:  Diagnosis Date  . Alopecia   . Arthritis   . Atrial fibrillation ([place])   . Breast cancer ([place])    double mastectomy 2011  . Clavicle fracture    right clavicle-hardware retained  . Clotting disorder ([place])    with CVA- 2013, 2014  . Cough    PERSISTANT COUGH X 20 YRS  . Difficulty sleeping    takes Ambien  . Disorders of bilirubin excretion   . Diverticulosis   . Environmental and seasonal allergies    "dry cough", related to ? allergies  . GERD (gastroesophageal reflux disease)   . Hyperlipidemia   . MVP (mitral valve prolapse)   . Osteopenia   . Osteoporosis   . Stroke ([place]) 06/07/11   x2 episodes-"funny feeling"" equilibrium off x24 hours, x1 speech affected- no residuallast 1 yr ago.  . Tubulovillous adenoma of colon 08/2003    ROS:   All systems reviewed and negative except as noted in the HPI.   Past Surgical History:  Procedure Laterality Date  . ABDOMINAL HYSTERECTOMY  1993  . BREAST CAPSULOTOMY     after bilat mastectomies-implants removed  . BREAST ENHANCEMENT SURGERY     x3-then removed 2011 for bilat mastectomies  . BREAST SURGERY  june 2011   double mastectomy nodes from rt  . CHEST WALL TUMOR EXCISION  5/12   noduals removed-neg  . collarbone  september 2011   pinned and plated, right  . COLONOSCOPY    . JOINT REPLACEMENT  july 9th, 2012   left knee replacement   . KNEE RECONSTRUCTION  7/12   revision lt total knee  . LOOP RECORDER IMPLANT N/A 11/07/2013   Procedure: LOOP RECORDER IMPLANT;  Surgeon: Evans Lance, MD;  Location: [facility] CATH LAB;  Service: Cardiovascular;  Laterality: N/A;  . MASS EXCISION Left 06/19/2012   Procedure: EXCISION OF LARGE MASS LEFT NECK/SHOULDER WITH PLASTIC CLOSURE;  Surgeon: Cristine Polio, MD;  Location: [place];  Service: Plastics;  Laterality: Left;  . NASAL SEPTUM SURGERY    . TEE WITHOUT CARDIOVERSION N/A 11/07/2013   Procedure: TRANSESOPHAGEAL ECHOCARDIOGRAM (TEE);  Surgeon: Dorothy Spark, MD;  Location: [place];  Service: Cardiovascular;  Laterality: N/A;  . TONSILLECTOMY    . TOTAL HIP ARTHROPLASTY Right 04/10/2013   Procedure: RIGHT TOTAL HIP ARTHROPLASTY ANTERIOR APPROACH;  Surgeon: [name], MD;  Location: [facility];  Service: Orthopedics;  Laterality: Right;  . TOTAL HIP ARTHROPLASTY Left 12/31/2014   Procedure: LEFT TOTAL HIP ARTHROPLASTY ANTERIOR APPROACH;  Surgeon: Paralee Cancel, MD;  Location: [facility];  Service: Orthopedics;  Laterality: Left;  . UPPER GASTROINTESTINAL ENDOSCOPY    . VEIN SURGERY  1974   left leg     Family History  Problem Relation Age of Onset  . Aneurysm Father   . Colon cancer Neg Hx   . Esophageal cancer Neg Hx   . Rectal cancer Neg Hx   . Stomach cancer Neg Hx      Social History   Socioeconomic History  . Marital status: Widowed    Spouse name: Not on file  . Number of children: 2  . Years of education: college  . Highest education level: Not on file  Social Needs  . Financial resource strain: Not on file  . Food insecurity - worry: Not on file  . Food insecurity - inability: Not on file  . Transportation needs - medical: Not on file  . Transportation needs - non-medical: Not on file  Occupational History  . Occupation: retired    Fish farm manager: Retired    Fish farm manager: RETIRED  Tobacco Use  . Smoking status: Never Smoker  . Smokeless tobacco: Never Used  Substance and Sexual Activity  . Alcohol use: Yes    Alcohol/week: 0.6 oz    Types: 1 Glasses of wine per week    Comment: 1 glass of wine per night  . Drug use: No  . Sexual activity: Not on file  Other Topics Concern  . Not on file  Social History Narrative   Patient is single with 2 children.    Patient is left handed.   Patient has college education.   Patient drinks coffee daily     BP 132/66   Pulse 84   Ht 5\' 5"  (1.651 m)   Wt 141 lb (64 kg)   BMI 23.46 kg/m   Physical Exam:  Well appearing 77 yo woman, NAD HEENT: Unremarkable Neck:  6 cm JVD, no thyromegally Lymphatics:  No adenopathy Back:  No CVA tenderness Lungs:  Clear with no wheezes HEART:  Regular rate rhythm, no murmurs, no rubs, no clicks Abd:  soft, positive bowel sounds, no organomegally, no  rebound, no guarding Ext:  2 plus pulses, no edema, no cyanosis, no clubbing Skin:  No rashes no nodules Neuro:  CN II through XII intact, motor grossly intact  EKG - none  DEVICE  Normal device function.  See PaceArt for details. EOS.  Assess/Plan: 1. Cryptogenic stroke -  she has had no more since being found to have atrial fib and being placed on Xarelto. 2. ILR - her device is at end of service. She would like to have it removed. We will schedule. 3. Dyslipidemia - she will continue her statin.  Mikle Bosworth.D.

## 2017-05-06 ENCOUNTER — Other Ambulatory Visit: Payer: Self-pay | Admitting: Internal Medicine

## 2017-05-16 ENCOUNTER — Encounter (HOSPITAL_COMMUNITY): Payer: Self-pay | Admitting: Internal Medicine

## 2017-05-16 ENCOUNTER — Encounter (HOSPITAL_COMMUNITY)
Admission: RE | Disposition: A | Discharge status: Home / Self Care | Payer: Self-pay | Source: Ambulatory Visit | Attending: Internal Medicine

## 2017-05-16 ENCOUNTER — Ambulatory Visit (HOSPITAL_COMMUNITY)
Admission: RE | Admit: 2017-05-16 | Discharge: 2017-05-16 | Disposition: A | Discharge status: Home / Self Care | Payer: Medicare Other | Source: Ambulatory Visit | Attending: Internal Medicine | Admitting: Internal Medicine

## 2017-05-16 DIAGNOSIS — Z7901 Long term (current) use of anticoagulants: Secondary | ICD-10-CM | POA: Insufficient documentation

## 2017-05-16 DIAGNOSIS — E785 Hyperlipidemia, unspecified: Secondary | ICD-10-CM | POA: Diagnosis not present

## 2017-05-16 DIAGNOSIS — Z882 Allergy status to sulfonamides status: Secondary | ICD-10-CM | POA: Diagnosis not present

## 2017-05-16 DIAGNOSIS — K219 Gastro-esophageal reflux disease without esophagitis: Secondary | ICD-10-CM | POA: Diagnosis not present

## 2017-05-16 DIAGNOSIS — I341 Nonrheumatic mitral (valve) prolapse: Secondary | ICD-10-CM | POA: Diagnosis not present

## 2017-05-16 DIAGNOSIS — Z8673 Personal history of transient ischemic attack (TIA), and cerebral infarction without residual deficits: Secondary | ICD-10-CM

## 2017-05-16 DIAGNOSIS — M810 Age-related osteoporosis without current pathological fracture: Secondary | ICD-10-CM | POA: Insufficient documentation

## 2017-05-16 DIAGNOSIS — Z4509 Encounter for adjustment and management of other cardiac device: Secondary | ICD-10-CM | POA: Diagnosis not present

## 2017-05-16 DIAGNOSIS — I6389 Other cerebral infarction: Secondary | ICD-10-CM | POA: Diagnosis not present

## 2017-05-16 DIAGNOSIS — I4891 Unspecified atrial fibrillation: Secondary | ICD-10-CM | POA: Insufficient documentation

## 2017-05-16 HISTORY — PX: LOOP RECORDER REMOVAL: EP1215

## 2017-05-16 SURGERY — LOOP RECORDER REMOVAL

## 2017-05-16 MED ORDER — LIDOCAINE-EPINEPHRINE 1 %-1:100000 IJ SOLN
INTRAMUSCULAR | Status: AC
  Filled 2017-05-16: qty 1

## 2017-05-16 MED ORDER — LIDOCAINE-EPINEPHRINE 1 %-1:100000 IJ SOLN
INTRAMUSCULAR | Status: DC | PRN
  Administered 2017-05-16: 30 mL

## 2017-05-16 SURGICAL SUPPLY — 1 items: PACK LOOP INSERTION (CUSTOM PROCEDURE TRAY) ×3 IMPLANT

## 2017-05-16 NOTE — Interval H&P Note (Signed)
History and Physical Interval Note:  05/16/2017 7:07 AM  Renate S Sweney  has presented today for surgery, with the diagnosis of eri  The various methods of treatment have been discussed with the patient and family. After consideration of risks, benefits and other options for treatment, the patient has consented to  Procedure(s): LOOP RECORDER REMOVAL (N/A) as a surgical intervention .  The patient's history has been reviewed, patient examined, no change in status, stable for surgery.  I have reviewed the patient's chart and labs.  Questions were answered to the patient's satisfaction.     Cristopher Peru

## 2017-05-19 ENCOUNTER — Other Ambulatory Visit: Payer: Self-pay | Admitting: Internal Medicine

## 2017-06-02 ENCOUNTER — Ambulatory Visit (INDEPENDENT_AMBULATORY_CARE_PROVIDER_SITE_OTHER): Payer: Self-pay | Admitting: *Deleted

## 2017-06-02 DIAGNOSIS — I639 Cerebral infarction, unspecified: Secondary | ICD-10-CM

## 2017-06-02 NOTE — Progress Notes (Signed)
Wound check in clinic, s/p ILR explant on 05/16/17. Steri-strips removed. No redness or edema noted. Incision edges approximated and well healed. Patient aware to monitor for signs/symptoms of infection. ROV with GT PRN.

## 2017-07-12 DIAGNOSIS — M25561 Pain in right knee: Secondary | ICD-10-CM | POA: Diagnosis not present

## 2017-07-12 DIAGNOSIS — M179 Osteoarthritis of knee, unspecified: Secondary | ICD-10-CM | POA: Diagnosis not present

## 2017-08-03 DIAGNOSIS — M25561 Pain in right knee: Secondary | ICD-10-CM | POA: Diagnosis not present

## 2017-08-03 DIAGNOSIS — M1711 Unilateral primary osteoarthritis, right knee: Secondary | ICD-10-CM | POA: Diagnosis not present

## 2017-08-17 ENCOUNTER — Telehealth: Payer: Self-pay

## 2017-08-17 ENCOUNTER — Telehealth: Payer: Self-pay | Admitting: Internal Medicine

## 2017-08-17 NOTE — Telephone Encounter (Signed)
Walk in pt form-Clearance dropped off placed in Triage box.

## 2017-08-17 NOTE — Telephone Encounter (Signed)
   [facility] Pre-operative Risk Assessment    Request for surgical clearance:  1. What type of surgery is being performed? Right Knee replacement    2. When is this surgery scheduled? 09/13/17   3. What type of clearance is required (medical clearance vs. Pharmacy clearance to hold med vs. Both)? Medical and pharmacy  4. Are there any medications that need to be held prior to surgery and how long? Xarelto?   5. Practice name and name of physician performing surgery? [facility]/ Dr. Alvan Dame   6. What is your office phone number [phone]    7.   What is your office fax number        [phone]  8.   Anesthesia type (None, local, MAC, general) ? Spinal

## 2017-08-18 NOTE — Telephone Encounter (Signed)
   Primary Cardiologist:Gregg Lovena Le, MD  Chart reviewed as part of pre-operative protocol coverage. H/o cryptogenic strokes then found to have afib on LINQ, breast CA, arthritis, GERD, HLD, osteoprosis. MVP listed in chart but last echo 2015 does not suggest this, only mild MR by TEE. Last OV 04/2017 with Dr. Lovena Le, doing well. RCRI 0.9% indicating low risk of procedure. Will route to pharm for input on Xarelto given h/o CVAs, then can assess pt via phone for symptoms. Charlie Pitter, PA-C 08/18/2017, 4:21 PM

## 2017-08-19 DIAGNOSIS — M25561 Pain in right knee: Secondary | ICD-10-CM | POA: Diagnosis not present

## 2017-08-19 DIAGNOSIS — M179 Osteoarthritis of knee, unspecified: Secondary | ICD-10-CM | POA: Diagnosis not present

## 2017-08-19 DIAGNOSIS — N390 Urinary tract infection, site not specified: Secondary | ICD-10-CM | POA: Diagnosis not present

## 2017-08-19 DIAGNOSIS — Z01818 Encounter for other preprocedural examination: Secondary | ICD-10-CM | POA: Diagnosis not present

## 2017-08-19 DIAGNOSIS — Z8673 Personal history of transient ischemic attack (TIA), and cerebral infarction without residual deficits: Secondary | ICD-10-CM | POA: Diagnosis not present

## 2017-08-19 DIAGNOSIS — Z79899 Other long term (current) drug therapy: Secondary | ICD-10-CM | POA: Diagnosis not present

## 2017-08-19 DIAGNOSIS — Z7901 Long term (current) use of anticoagulants: Secondary | ICD-10-CM | POA: Diagnosis not present

## 2017-08-19 DIAGNOSIS — Z7902 Long term (current) use of antithrombotics/antiplatelets: Secondary | ICD-10-CM | POA: Diagnosis not present

## 2017-08-19 NOTE — Telephone Encounter (Signed)
   Primary Cardiologist: Cristopher Peru, MD  Chart reviewed and patient interviewed over the phone as part of pre-operative protocol coverage. Given past medical history and time since last visit, based on ACC/AHA guidelines, Felicia Hunter would be at acceptable risk for the planned procedure without further cardiovascular testing.   OK to hold Xarelto 3 days pre op if needed. Resume full dose anticoagulation post op when Dr Alvan Dame feels it is appropriate.   I will route this recommendation to the requesting party via Epic fax function and remove from pre-op pool.  Please call with questions.  Kerin Ransom, PA-C 08/19/2017, 2:10 PM

## 2017-08-19 NOTE — Telephone Encounter (Signed)
Patient with diagnosis of atrial fibrillation on Xarelto for anticoagulation.    Procedure: right knee replacement Date of procedure: 09/13/17  CHADS2-VASc score of  5 (, AGE, , stroke/tia x 2, , AGE, female)  CrCl 52.9 Platelet count 219  Per chart patient stroke history is x 3.   Hold Xarelto x 3 days due to need for spinal anesthesia.    For orthopedic procedures please be sure to resume therapeutic (not prophylactic) dosing as soon as authorized by Psychologist, sport and exercise.

## 2017-09-05 NOTE — Progress Notes (Addendum)
Clearance 08-17-17 chart Dr. Lovena Le cardiology   Clearance Dr. Shelia Media 08-10-17 chart  EKG 12-181-8 chart  cxr 11-09-16 chart  Cbc,cmp 08-19-17 chart and pt,ptt, hgba1c, ua  lov Dr Shelia Media 08-19-17 chart

## 2017-09-05 NOTE — H&P (Signed)
TOTAL KNEE ADMISSION H&P  Patient is being admitted for right total knee arthroplasty.  Subjective:  Chief Complaint:    Right knee primary OA / pain  HPI: Felicia Hunter, 77 y.o. female, has a history of pain and functional disability in the right knee due to arthritis and has failed non-surgical conservative treatments for greater than 12 weeks to include NSAID's and/or analgesics, corticosteriod injections and activity modification.  Onset of symptoms was gradual, starting ~1 years ago with gradually worsening course since that time. The patient noted no past surgery on the right knee(s).  Patient currently rates pain in the right knee(s) at 8 out of 10 with activity. Patient has worsening of pain with activity and weight bearing, pain that interferes with activities of daily living, pain with passive range of motion, crepitus and joint swelling.  Patient has evidence of periarticular osteophytes and joint space narrowing by imaging studies.  There is no active infection.   Risks, benefits and expectations were discussed with the patient.  Risks including but not limited to the risk of anesthesia, blood clots, nerve damage, blood vessel damage, failure of the prosthesis, infection and up to and including death.  Patient understand the risks, benefits and expectations and wishes to proceed with surgery.   PCP: Deland Pretty, MD  D/C Plans:       Home   Post-op Meds:       No Rx given   Tranexamic Acid:      To be given - IV   Decadron:      Is to be given  FYI:     Xarelto (on pre-op)  Norco  DME:     Pt already has equipment  PT:   OPPT Rx given   Patient Active Problem List   Diagnosis Date Noted  . Right leg pain 03/01/2016  . S/P left THA, AA 12/31/2014  . Cerebral infarction due to embolism of precerebral artery ([place]) 12/20/2013  . History of stroke 12/20/2013  . Expressive aphasia 11/05/2013  . Expected blood loss anemia 04/11/2013  . Hyponatremia 04/11/2013  . Other and  unspecified hyperlipidemia 06/15/2012  . Lacunar infarction ([place]) 06/15/2012  . CVA (cerebral infarction)-mid/inf cerebellar infarct 3/25 06/08/2011  . Near syncope 06/07/2011  . 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 DEPRESSION 10/28/2006  . ALLERGIC RHINITIS 10/28/2006  . ASTHMA 10/28/2006   Past Medical History:  Diagnosis Date  . Alopecia   . Arthritis   . Atrial fibrillation ([place])   . Breast cancer ([place])    double mastectomy 2011  . Clavicle fracture    right clavicle-hardware retained  . Clotting disorder ([place])    with CVA- 2013, 2014  . Cough    PERSISTANT COUGH X 20 YRS  . Difficulty sleeping    takes Ambien  . Disorders of bilirubin excretion   . Diverticulosis   . Environmental and seasonal allergies    "dry cough", related to ? allergies  . GERD (gastroesophageal reflux disease)   . Hyperlipidemia   . MVP (mitral valve prolapse)   . Osteopenia   . Osteoporosis   . Stroke ([place]) 06/07/11   x2 episodes-"funny feeling"" equilibrium off x24 hours, x1 speech affected- no residuallast 1 yr ago.  . Tubulovillous adenoma of colon 08/2003    Past Surgical History:  Procedure Laterality Date  . ABDOMINAL HYSTERECTOMY  1993  . BREAST CAPSULOTOMY     after bilat mastectomies-implants removed  . BREAST ENHANCEMENT SURGERY  x3-then removed 2011 for bilat mastectomies  . BREAST SURGERY  june 2011   double mastectomy nodes from rt  . CHEST WALL TUMOR EXCISION  5/12   noduals removed-neg  . collarbone  september 2011   pinned and plated, right  . COLONOSCOPY    . JOINT REPLACEMENT  july 9th, 2012   left knee replacement   . KNEE RECONSTRUCTION  7/12   revision lt total knee  . LOOP RECORDER IMPLANT N/A 11/07/2013   Procedure: LOOP RECORDER IMPLANT;  Surgeon: Evans Lance, MD;  Location: [facility] CATH LAB;  Service: Cardiovascular;  Laterality: N/A;  . LOOP RECORDER REMOVAL N/A 05/16/2017   Procedure: LOOP RECORDER REMOVAL;  Surgeon: Evans Lance, MD;  Location: [place] CV LAB;  Service: Cardiovascular;  Laterality: N/A;  . MASS EXCISION Left 06/19/2012   Procedure: EXCISION OF LARGE MASS LEFT NECK/SHOULDER WITH PLASTIC CLOSURE;  Surgeon: Cristine Polio, MD;  Location: [place];  Service: Plastics;  Laterality: Left;  . NASAL SEPTUM SURGERY    . TEE WITHOUT CARDIOVERSION N/A 11/07/2013   Procedure: TRANSESOPHAGEAL ECHOCARDIOGRAM (TEE);  Surgeon: Dorothy Spark, MD;  Location: [place];  Service: Cardiovascular;  Laterality: N/A;  . TONSILLECTOMY    . TOTAL HIP ARTHROPLASTY Right 04/10/2013   Procedure: RIGHT TOTAL HIP ARTHROPLASTY ANTERIOR APPROACH;  Surgeon: [name], MD;  Location: [facility];  Service: Orthopedics;  Laterality: Right;  . TOTAL HIP ARTHROPLASTY Left 12/31/2014   Procedure: LEFT TOTAL HIP ARTHROPLASTY ANTERIOR APPROACH;  Surgeon: Paralee Cancel, MD;  Location: [facility];  Service: Orthopedics;  Laterality: Left;  . UPPER GASTROINTESTINAL ENDOSCOPY    . VEIN SURGERY  1974   left leg    Current Facility-Administered Medications  Medication Dose Route Frequency Provider Last Rate Last Dose  . 0.9 %  sodium chloride infusion  500 mL Intravenous Continuous Lucio Edward T, MD      . 0.9 %  sodium chloride infusion  500 mL Intravenous Continuous Ladene Artist, MD       Current Outpatient Medications  Medication Sig Dispense Refill Last Dose  . alendronate (FOSAMAX) 70 MG tablet Take 70 mg by mouth every Sunday.    Taking  . atorvastatin (LIPITOR) 20 MG tablet Take 20 mg by mouth every evening.    Taking  . BIOTIN PO Take 1 tablet by mouth daily.   Taking  . Calcium Carb-Cholecalciferol (CALCIUM 600/VITAMIN D3 PO) Take 1 tablet by mouth daily.   Taking  . cholecalciferol (VITAMIN D) 1000 UNITS tablet Take 1,000 Units by mouth daily.   Taking  . Omega-3 Fatty Acids (FISH OIL PO) Take 1 capsule by mouth 2 (two) times daily.   Taking  . polyvinyl alcohol (LIQUIFILM TEARS) 1.4 % ophthalmic  solution Place 1 drop into both eyes as needed for dry eyes.    Taking  . vitamin C (ASCORBIC ACID) 500 MG tablet Take 500 mg by mouth daily as needed (for cold symptoms).    Taking  . XARELTO 20 MG TABS tablet TAKE 1 TABLET ONCE DAILY WITH SUPPER. 30 tablet 5 Taking  . zolpidem (AMBIEN) 10 MG tablet Take 10 mg by mouth at bedtime as needed for sleep.    Taking   Allergies  Allergen Reactions  . Sulfonamide Derivatives Rash    Social History   Tobacco Use  . Smoking status: Never Smoker  . Smokeless tobacco: Never Used  Substance Use Topics  . Alcohol use: Yes  Alcohol/week: 0.6 oz    Types: 1 Glasses of wine per week    Comment: 1 glass of wine per night    Family History  Problem Relation Age of Onset  . Aneurysm Father   . Colon cancer Neg Hx   . Esophageal cancer Neg Hx   . Rectal cancer Neg Hx   . Stomach cancer Neg Hx      Review of Systems  Constitutional: Negative.   HENT: Negative.   Eyes: Negative.   Respiratory: Negative.   Cardiovascular: Negative.   Gastrointestinal: Negative.   Genitourinary: Positive for urgency.  Musculoskeletal: Positive for joint pain.  Skin: Negative.   Neurological: Negative.   Endo/Heme/Allergies: Positive for environmental allergies.  Psychiatric/Behavioral: The patient has insomnia.     Objective:  Physical Exam  Constitutional: She is oriented to person, place, and time. She appears well-developed.  HENT:  Head: Normocephalic.  Eyes: Pupils are equal, round, and reactive to light.  Neck: Neck supple. No JVD present. No tracheal deviation present. No thyromegaly present.  Cardiovascular: Normal rate, regular rhythm and intact distal pulses.  Respiratory: Effort normal and breath sounds normal. No respiratory distress. She has no wheezes.  GI: Soft. There is no tenderness. There is no guarding.  Musculoskeletal:       Right knee: She exhibits decreased range of motion, swelling and bony tenderness. She exhibits no  ecchymosis, no deformity, no laceration and no erythema. Tenderness found.  Lymphadenopathy:    She has no cervical adenopathy.  Neurological: She is alert and oriented to person, place, and time.  Skin: Skin is warm and dry.  Psychiatric: She has a normal mood and affect.     Labs:  Estimated body mass index is 22.6 kg/m as calculated from the following:   Height as of 05/16/17: 5\' 6"  (1.676 m).   Weight as of 05/16/17: 63.5 kg (140 lb).   Imaging Review Plain radiographs demonstrate severe degenerative joint disease of the right knee(s). The overall alignment isneutral. The bone quality appears to be good for age and reported activity level.   Preoperative templating of the joint replacement has been completed, documented, and submitted to the Operating Room personnel in order to optimize intra-operative equipment management.   Anticipated LOS equal to or greater than 2 midnights due to - Age 62 and older with one or more of the following:  - Obesity  - Expected need for hospital services (PT, OT, Nursing) required for safe  discharge  - Active co-morbidities: Stroke    Assessment/Plan:  End stage arthritis, right knee   The patient history, physical examination, clinical judgment of the provider and imaging studies are consistent with end stage degenerative joint disease of the right knee(s) and total knee arthroplasty is deemed medically necessary. The treatment options including medical management, injection therapy arthroscopy and arthroplasty were discussed at length. The risks and benefits of total knee arthroplasty were presented and reviewed. The risks due to aseptic loosening, infection, stiffness, patella tracking problems, thromboembolic complications and other imponderables were discussed. The patient acknowledged the explanation, agreed to proceed with the plan and consent was signed. Patient is being admitted for inpatient treatment for surgery, pain control, PT, OT,  prophylactic antibiotics, VTE prophylaxis, progressive ambulation and ADL's and discharge planning. The patient is planning to be discharged home.     [place] Sherronda Sweigert   PA-C  09/05/2017, 11:47 AM

## 2017-09-05 NOTE — Patient Instructions (Signed)
Felicia Hunter  09/05/2017   Your procedure is scheduled on: 09-13-17  Report to [facility] Main  Entrance              Report to admitting at     09-13-17 AM    Call this number if you have problems the morning of surgery [phone]    Remember: Do not eat food or drink liquids :After Midnight.     Take these medicines the morning of surgery with A SIP OF WATER: none                                You may not have any metal on your body including hair pins and              piercings  Do not wear jewelry, make-up, lotions, powders or perfumes, deodorant             Do not wear nail polish.  Do not shave  48 hours prior to surgery.             Do not bring valuables to the hospital. [place].  Contacts, dentures or bridgework may not be worn into surgery.  Leave suitcase in the car. After surgery it may be brought to your room.                 Please read over the following fact sheets you were given: _____________________________________________________________________          [facility] - Preparing for Surgery Before surgery, you can play an important role.  Because skin is not sterile, your skin needs to be as free of germs as possible.  You can reduce the number of germs on your skin by washing with CHG (chlorahexidine gluconate) soap before surgery.  CHG is an antiseptic cleaner which kills germs and bonds with the skin to continue killing germs even after washing. Please DO NOT use if you have an allergy to CHG or antibacterial soaps.  If your skin becomes reddened/irritated stop using the CHG and inform your nurse when you arrive at Short Stay. Do not shave (including legs and underarms) for at least 48 hours prior to the first CHG shower.  You may shave your face/neck. Please follow these instructions carefully:  1.  Shower with CHG Soap the night before surgery and the  morning of Surgery.  2.  If  you choose to wash your hair, wash your hair first as usual with your  normal  shampoo.  3.  After you shampoo, rinse your hair and body thoroughly to remove the  shampoo.                           4.  Use CHG as you would any other liquid soap.  You can apply chg directly  to the skin and wash                       Gently with a scrungie or clean washcloth.  5.  Apply the CHG Soap to your body ONLY FROM THE NECK DOWN.   Do not use on face/ open  Wound or open sores. Avoid contact with eyes, ears mouth and genitals (private parts).                       Wash face,  Genitals (private parts) with your normal soap.             6.  Wash thoroughly, paying special attention to the area where your surgery  will be performed.  7.  Thoroughly rinse your body with warm water from the neck down.  8.  DO NOT shower/wash with your normal soap after using and rinsing off  the CHG Soap.                9.  Pat yourself dry with a clean towel.            10.  Wear clean pajamas.            11.  Place clean sheets on your bed the night of your first shower and do not  sleep with pets. Day of Surgery : Do not apply any lotions/deodorants the morning of surgery.  Please wear clean clothes to the hospital/surgery center.  FAILURE TO FOLLOW THESE INSTRUCTIONS MAY RESULT IN THE CANCELLATION OF YOUR SURGERY PATIENT SIGNATURE_________________________________  NURSE SIGNATURE__________________________________  ________________________________________________________________________  WHAT IS A BLOOD TRANSFUSION? Blood Transfusion Information  A transfusion is the replacement of blood or some of its parts. Blood is made up of multiple cells which provide different functions.  Red blood cells carry oxygen and are used for blood loss replacement.  White blood cells fight against infection.  Platelets control bleeding.  Plasma helps clot blood.  Other blood products are available for  specialized needs, such as hemophilia or other clotting disorders. BEFORE THE TRANSFUSION  Who gives blood for transfusions?   Healthy volunteers who are fully evaluated to make sure their blood is safe. This is blood bank blood. Transfusion therapy is the safest it has ever been in the practice of medicine. Before blood is taken from a donor, a complete history is taken to make sure that person has no history of diseases nor engages in risky social behavior (examples are intravenous drug use or sexual activity with multiple partners). The donor's travel history is screened to minimize risk of transmitting infections, such as malaria. The donated blood is tested for signs of infectious diseases, such as HIV and hepatitis. The blood is then tested to be sure it is compatible with you in order to minimize the chance of a transfusion reaction. If you or a relative donates blood, this is often done in anticipation of surgery and is not appropriate for emergency situations. It takes many days to process the donated blood. RISKS AND COMPLICATIONS Although transfusion therapy is very safe and saves many lives, the main dangers of transfusion include:   Getting an infectious disease.  Developing a transfusion reaction. This is an allergic reaction to something in the blood you were given. Every precaution is taken to prevent this. The decision to have a blood transfusion has been considered carefully by your caregiver before blood is given. Blood is not given unless the benefits outweigh the risks. AFTER THE TRANSFUSION  Right after receiving a blood transfusion, you will usually feel much better and more energetic. This is especially true if your red blood cells have gotten low (anemic). The transfusion raises the level of the red blood cells which carry oxygen, and this usually causes an energy increase.  The  nurse administering the transfusion will monitor you carefully for complications. HOME CARE  INSTRUCTIONS  No special instructions are needed after a transfusion. You may find your energy is better. Speak with your caregiver about any limitations on activity for underlying diseases you may have. SEEK MEDICAL CARE IF:   Your condition is not improving after your transfusion.  You develop redness or irritation at the intravenous (IV) site. SEEK IMMEDIATE MEDICAL CARE IF:  Any of the following symptoms occur over the next 12 hours:  Shaking chills.  You have a temperature by mouth above 102 F (38.9 C), not controlled by medicine.  Chest, back, or muscle pain.  People around you feel you are not acting correctly or are confused.  Shortness of breath or difficulty breathing.  Dizziness and fainting.  You get a rash or develop hives.  You have a decrease in urine output.  Your urine turns a dark color or changes to pink, red, or brown. Any of the following symptoms occur over the next 10 days:  You have a temperature by mouth above 102 F (38.9 C), not controlled by medicine.  Shortness of breath.  Weakness after normal activity.  The white part of the eye turns yellow (jaundice).  You have a decrease in the amount of urine or are urinating less often.  Your urine turns a dark color or changes to pink, red, or brown. Document Released: 02/27/2000 Document Revised: 05/24/2011 Document Reviewed: 10/16/2007 ExitCare Patient Information 2014 [place].  _______________________________________________________________________  Incentive Spirometer  An incentive spirometer is a tool that can help keep your lungs clear and active. This tool measures how well you are filling your lungs with each breath. Taking long deep breaths may help reverse or decrease the chance of developing breathing (pulmonary) problems (especially infection) following:  A long period of time when you are unable to move or be active. BEFORE THE PROCEDURE   If the spirometer includes an  indicator to show your best effort, your nurse or respiratory therapist will set it to a desired goal.  If possible, sit up straight or lean slightly forward. Try not to slouch.  Hold the incentive spirometer in an upright position. INSTRUCTIONS FOR USE  1. Sit on the edge of your bed if possible, or sit up as far as you can in bed or on a chair. 2. Hold the incentive spirometer in an upright position. 3. Breathe out normally. 4. Place the mouthpiece in your mouth and seal your lips tightly around it. 5. Breathe in slowly and as deeply as possible, raising the piston or the ball toward the top of the column. 6. Hold your breath for 3-5 seconds or for as long as possible. Allow the piston or ball to fall to the bottom of the column. 7. Remove the mouthpiece from your mouth and breathe out normally. 8. Rest for a few seconds and repeat Steps 1 through 7 at least 10 times every 1-2 hours when you are awake. Take your time and take a few normal breaths between deep breaths. 9. The spirometer may include an indicator to show your best effort. Use the indicator as a goal to work toward during each repetition. 10. After each set of 10 deep breaths, practice coughing to be sure your lungs are clear. If you have an incision (the cut made at the time of surgery), support your incision when coughing by placing a pillow or rolled up towels firmly against it. Once you are able to get  out of bed, walk around indoors and cough well. You may stop using the incentive spirometer when instructed by your caregiver.  RISKS AND COMPLICATIONS  Take your time so you do not get dizzy or light-headed.  If you are in pain, you may need to take or ask for pain medication before doing incentive spirometry. It is harder to take a deep breath if you are having pain. AFTER USE  Rest and breathe slowly and easily.  It can be helpful to keep track of a log of your progress. Your caregiver can provide you with a simple table  to help with this. If you are using the spirometer at home, follow these instructions: [place] IF:   You are having difficultly using the spirometer.  You have trouble using the spirometer as often as instructed.  Your pain medication is not giving enough relief while using the spirometer.  You develop fever of 100.5 F (38.1 C) or higher. SEEK IMMEDIATE MEDICAL CARE IF:   You cough up bloody sputum that had not been present before.  You develop fever of 102 F (38.9 C) or greater.  You develop worsening pain at or near the incision site. MAKE SURE YOU:   Understand these instructions.  Will watch your condition.  Will get help right away if you are not doing well or get worse. Document Released: 07/12/2006 Document Revised: 05/24/2011 Document Reviewed: 09/12/2006 [facility] Patient Information 2014 [place], [place].   ________________________________________________________________________

## 2017-09-06 ENCOUNTER — Encounter (HOSPITAL_COMMUNITY)
Admission: RE | Admit: 2017-09-06 | Discharge: 2017-09-06 | Disposition: A | Discharge status: Home / Self Care | Payer: Medicare Other | Source: Ambulatory Visit | Attending: Orthopedic Surgery | Admitting: Orthopedic Surgery

## 2017-09-06 ENCOUNTER — Other Ambulatory Visit: Payer: Self-pay

## 2017-09-06 ENCOUNTER — Encounter (HOSPITAL_COMMUNITY): Payer: Self-pay

## 2017-09-06 DIAGNOSIS — Z01812 Encounter for preprocedural laboratory examination: Secondary | ICD-10-CM | POA: Insufficient documentation

## 2017-09-06 DIAGNOSIS — M1711 Unilateral primary osteoarthritis, right knee: Secondary | ICD-10-CM | POA: Diagnosis not present

## 2017-09-06 DIAGNOSIS — M25561 Pain in right knee: Secondary | ICD-10-CM | POA: Diagnosis not present

## 2017-09-06 LAB — SURGICAL PCR SCREEN
MRSA, PCR: NEGATIVE
Staphylococcus aureus: NEGATIVE

## 2017-09-06 NOTE — Progress Notes (Signed)
Chart left for Felicia Hunter for follow up

## 2017-09-07 ENCOUNTER — Other Ambulatory Visit: Payer: Self-pay | Admitting: Orthopedic Surgery

## 2017-09-07 NOTE — Care Plan (Signed)
Ortho Bundle R TKA scheduled on 09-13-17 DCP:  Home with dtr.  2 story/2 ste. DME:  Has a RW and 3-in-1 PT:  [facility].  Eval scheduled for 09-19-17.

## 2017-09-12 MED ORDER — TRANEXAMIC ACID 1000 MG/10ML IV SOLN
1000.0000 mg | INTRAVENOUS | Status: AC
  Administered 2017-09-13: 1000 mg via INTRAVENOUS
  Filled 2017-09-12: qty 1100

## 2017-09-13 ENCOUNTER — Other Ambulatory Visit: Payer: Self-pay

## 2017-09-13 ENCOUNTER — Encounter (HOSPITAL_COMMUNITY): Payer: Self-pay

## 2017-09-13 ENCOUNTER — Inpatient Hospital Stay (HOSPITAL_COMMUNITY): Payer: Medicare Other | Admitting: Anesthesiology

## 2017-09-13 ENCOUNTER — Encounter (HOSPITAL_COMMUNITY)
Admission: RE | Disposition: A | Discharge status: Home / Self Care | Payer: Self-pay | Source: Ambulatory Visit | Attending: Orthopedic Surgery

## 2017-09-13 ENCOUNTER — Observation Stay (HOSPITAL_COMMUNITY)
Admission: RE | Admit: 2017-09-13 | Discharge: 2017-09-14 | Disposition: A | Discharge status: Home / Self Care | Payer: Medicare Other | Source: Ambulatory Visit | Attending: Orthopedic Surgery | Admitting: Orthopedic Surgery

## 2017-09-13 DIAGNOSIS — M1711 Unilateral primary osteoarthritis, right knee: Principal | ICD-10-CM | POA: Insufficient documentation

## 2017-09-13 DIAGNOSIS — Z96652 Presence of left artificial knee joint: Secondary | ICD-10-CM | POA: Diagnosis not present

## 2017-09-13 DIAGNOSIS — G8918 Other acute postprocedural pain: Secondary | ICD-10-CM | POA: Diagnosis not present

## 2017-09-13 DIAGNOSIS — Z79899 Other long term (current) drug therapy: Secondary | ICD-10-CM | POA: Insufficient documentation

## 2017-09-13 DIAGNOSIS — Z9011 Acquired absence of right breast and nipple: Secondary | ICD-10-CM | POA: Insufficient documentation

## 2017-09-13 DIAGNOSIS — Z96651 Presence of right artificial knee joint: Secondary | ICD-10-CM

## 2017-09-13 DIAGNOSIS — Z96659 Presence of unspecified artificial knee joint: Secondary | ICD-10-CM

## 2017-09-13 DIAGNOSIS — Z853 Personal history of malignant neoplasm of breast: Secondary | ICD-10-CM | POA: Insufficient documentation

## 2017-09-13 DIAGNOSIS — Z8673 Personal history of transient ischemic attack (TIA), and cerebral infarction without residual deficits: Secondary | ICD-10-CM | POA: Insufficient documentation

## 2017-09-13 DIAGNOSIS — J45909 Unspecified asthma, uncomplicated: Secondary | ICD-10-CM | POA: Diagnosis not present

## 2017-09-13 DIAGNOSIS — Z96643 Presence of artificial hip joint, bilateral: Secondary | ICD-10-CM | POA: Insufficient documentation

## 2017-09-13 DIAGNOSIS — Z7901 Long term (current) use of anticoagulants: Secondary | ICD-10-CM | POA: Diagnosis not present

## 2017-09-13 DIAGNOSIS — M659 Synovitis and tenosynovitis, unspecified: Secondary | ICD-10-CM | POA: Diagnosis not present

## 2017-09-13 DIAGNOSIS — M25761 Osteophyte, right knee: Secondary | ICD-10-CM | POA: Insufficient documentation

## 2017-09-13 DIAGNOSIS — F329 Major depressive disorder, single episode, unspecified: Secondary | ICD-10-CM | POA: Diagnosis not present

## 2017-09-13 DIAGNOSIS — Z7983 Long term (current) use of bisphosphonates: Secondary | ICD-10-CM | POA: Diagnosis not present

## 2017-09-13 DIAGNOSIS — E785 Hyperlipidemia, unspecified: Secondary | ICD-10-CM | POA: Diagnosis not present

## 2017-09-13 DIAGNOSIS — M25461 Effusion, right knee: Secondary | ICD-10-CM | POA: Insufficient documentation

## 2017-09-13 HISTORY — PX: TOTAL KNEE ARTHROPLASTY: SHX125

## 2017-09-13 LAB — TYPE AND SCREEN
ABO/RH(D): A POS
ANTIBODY SCREEN: NEGATIVE

## 2017-09-13 SURGERY — ARTHROPLASTY, KNEE, TOTAL
Anesthesia: Spinal | Site: Knee | Laterality: Right

## 2017-09-13 MED ORDER — HYDROCODONE-ACETAMINOPHEN 7.5-325 MG PO TABS: 1.0000 | ORAL_TABLET | ORAL | 0 refills | Status: DC | PRN

## 2017-09-13 MED ORDER — PHENYLEPHRINE 40 MCG/ML (10ML) SYRINGE FOR IV PUSH (FOR BLOOD PRESSURE SUPPORT)
PREFILLED_SYRINGE | INTRAVENOUS | Status: AC
  Filled 2017-09-13: qty 10

## 2017-09-13 MED ORDER — HYDROCODONE-ACETAMINOPHEN 5-325 MG PO TABS: 1.0000 | ORAL_TABLET | ORAL | Status: DC | PRN

## 2017-09-13 MED ORDER — PROPOFOL 500 MG/50ML IV EMUL
INTRAVENOUS | Status: DC | PRN
  Administered 2017-09-13: 120 ug/kg/min via INTRAVENOUS

## 2017-09-13 MED ORDER — ONDANSETRON HCL 4 MG/2ML IJ SOLN
4.0000 mg | Freq: Four times a day (QID) | INTRAMUSCULAR | Status: DC | PRN
  Administered 2017-09-13: 4 mg via INTRAVENOUS
  Filled 2017-09-13: qty 2

## 2017-09-13 MED ORDER — TRANEXAMIC ACID 1000 MG/10ML IV SOLN
1000.0000 mg | Freq: Once | INTRAVENOUS | Status: AC
  Administered 2017-09-13: 1000 mg via INTRAVENOUS
  Filled 2017-09-13: qty 1100

## 2017-09-13 MED ORDER — POLYETHYLENE GLYCOL 3350 17 G PO PACK: 17.0000 g | PACK | Freq: Two times a day (BID) | ORAL | 0 refills | Status: DC

## 2017-09-13 MED ORDER — TRAMADOL HCL 50 MG PO TABS
50.0000 mg | ORAL_TABLET | Freq: Four times a day (QID) | ORAL | Status: DC
  Administered 2017-09-13 – 2017-09-14 (×5): 50 mg via ORAL
  Filled 2017-09-13 (×5): qty 1

## 2017-09-13 MED ORDER — METHOCARBAMOL 500 MG PO TABS
500.0000 mg | ORAL_TABLET | Freq: Four times a day (QID) | ORAL | Status: DC | PRN
  Administered 2017-09-13 – 2017-09-14 (×2): 500 mg via ORAL
  Filled 2017-09-13 (×2): qty 1

## 2017-09-13 MED ORDER — PHENOL 1.4 % MT LIQD: 1.0000 | OROMUCOSAL | Status: DC | PRN

## 2017-09-13 MED ORDER — FENTANYL CITRATE (PF) 100 MCG/2ML IJ SOLN
INTRAMUSCULAR | Status: AC
  Filled 2017-09-13: qty 2

## 2017-09-13 MED ORDER — LIDOCAINE 2% (20 MG/ML) 5 ML SYRINGE
INTRAMUSCULAR | Status: AC
  Filled 2017-09-13: qty 5

## 2017-09-13 MED ORDER — PROPOFOL 10 MG/ML IV BOLUS
INTRAVENOUS | Status: DC | PRN
  Administered 2017-09-13 (×7): 20 mg via INTRAVENOUS

## 2017-09-13 MED ORDER — DOCUSATE SODIUM 100 MG PO CAPS
100.0000 mg | ORAL_CAPSULE | Freq: Two times a day (BID) | ORAL | Status: DC
  Administered 2017-09-13 – 2017-09-14 (×2): 100 mg via ORAL
  Filled 2017-09-13 (×2): qty 1

## 2017-09-13 MED ORDER — MIDAZOLAM HCL 2 MG/2ML IJ SOLN
INTRAMUSCULAR | Status: AC
  Filled 2017-09-13: qty 2

## 2017-09-13 MED ORDER — FENTANYL CITRATE (PF) 100 MCG/2ML IJ SOLN: 25.0000 ug | INTRAMUSCULAR | Status: DC | PRN

## 2017-09-13 MED ORDER — MENTHOL 3 MG MT LOZG: 1.0000 | LOZENGE | OROMUCOSAL | Status: DC | PRN

## 2017-09-13 MED ORDER — SODIUM CHLORIDE 0.9 % IJ SOLN
INTRAMUSCULAR | Status: DC | PRN
  Administered 2017-09-13: 30 mL

## 2017-09-13 MED ORDER — METHOCARBAMOL 500 MG PO TABS: 500.0000 mg | ORAL_TABLET | Freq: Four times a day (QID) | ORAL | 0 refills | Status: DC | PRN

## 2017-09-13 MED ORDER — ONDANSETRON HCL 4 MG/2ML IJ SOLN
INTRAMUSCULAR | Status: AC
  Filled 2017-09-13: qty 2

## 2017-09-13 MED ORDER — KETOROLAC TROMETHAMINE 30 MG/ML IJ SOLN
INTRAMUSCULAR | Status: DC | PRN
  Administered 2017-09-13: 30 mg

## 2017-09-13 MED ORDER — HYDROMORPHONE HCL 2 MG/ML IJ SOLN
INTRAMUSCULAR | Status: AC
  Filled 2017-09-13: qty 1

## 2017-09-13 MED ORDER — HYDROMORPHONE HCL 1 MG/ML IJ SOLN
INTRAMUSCULAR | Status: DC | PRN
  Administered 2017-09-13 (×5): .4 mg via INTRAVENOUS

## 2017-09-13 MED ORDER — BUPIVACAINE-EPINEPHRINE (PF) 0.25% -1:200000 IJ SOLN
INTRAMUSCULAR | Status: DC | PRN
  Administered 2017-09-13: 30 mL

## 2017-09-13 MED ORDER — ALUM & MAG HYDROXIDE-SIMETH 200-200-20 MG/5ML PO SUSP: 15.0000 mL | ORAL | Status: DC | PRN

## 2017-09-13 MED ORDER — ATORVASTATIN CALCIUM 20 MG PO TABS
20.0000 mg | ORAL_TABLET | Freq: Every evening | ORAL | Status: DC
  Filled 2017-09-13: qty 1

## 2017-09-13 MED ORDER — DOCUSATE SODIUM 100 MG PO CAPS: 100.0000 mg | ORAL_CAPSULE | Freq: Two times a day (BID) | ORAL | 0 refills | Status: DC

## 2017-09-13 MED ORDER — LIDOCAINE 2% (20 MG/ML) 5 ML SYRINGE
INTRAMUSCULAR | Status: DC | PRN
  Administered 2017-09-13 (×2): 100 mg via INTRAVENOUS

## 2017-09-13 MED ORDER — FENTANYL CITRATE (PF) 100 MCG/2ML IJ SOLN
50.0000 ug | Freq: Once | INTRAMUSCULAR | Status: AC
  Administered 2017-09-13 (×2): 50 ug via INTRAVENOUS

## 2017-09-13 MED ORDER — MAGNESIUM CITRATE PO SOLN: 1.0000 | Freq: Once | ORAL | Status: DC | PRN

## 2017-09-13 MED ORDER — STERILE WATER FOR IRRIGATION IR SOLN
Status: DC | PRN
  Administered 2017-09-13: 2000 mL

## 2017-09-13 MED ORDER — DEXAMETHASONE SODIUM PHOSPHATE 10 MG/ML IJ SOLN
INTRAMUSCULAR | Status: AC
  Filled 2017-09-13: qty 1

## 2017-09-13 MED ORDER — ONDANSETRON HCL 4 MG/2ML IJ SOLN
INTRAMUSCULAR | Status: DC | PRN
  Administered 2017-09-13: 4 mg via INTRAVENOUS

## 2017-09-13 MED ORDER — HYDROCODONE-ACETAMINOPHEN 7.5-325 MG PO TABS
1.0000 | ORAL_TABLET | ORAL | Status: DC | PRN
  Administered 2017-09-13 – 2017-09-14 (×2): 1 via ORAL
  Filled 2017-09-13 (×2): qty 1

## 2017-09-13 MED ORDER — ACETAMINOPHEN 325 MG PO TABS
325.0000 mg | ORAL_TABLET | Freq: Four times a day (QID) | ORAL | Status: DC | PRN
  Administered 2017-09-14: 650 mg via ORAL
  Filled 2017-09-13: qty 2

## 2017-09-13 MED ORDER — 0.9 % SODIUM CHLORIDE (POUR BTL) OPTIME
TOPICAL | Status: DC | PRN
  Administered 2017-09-13: 1000 mL

## 2017-09-13 MED ORDER — SODIUM CHLORIDE 0.9 % IJ SOLN
INTRAMUSCULAR | Status: AC
  Filled 2017-09-13: qty 50

## 2017-09-13 MED ORDER — SODIUM CHLORIDE 0.9 % IV SOLN
INTRAVENOUS | Status: DC
  Administered 2017-09-13 – 2017-09-14 (×2): via INTRAVENOUS

## 2017-09-13 MED ORDER — PHENYLEPHRINE 40 MCG/ML (10ML) SYRINGE FOR IV PUSH (FOR BLOOD PRESSURE SUPPORT)
PREFILLED_SYRINGE | INTRAVENOUS | Status: DC | PRN
  Administered 2017-09-13: 80 ug via INTRAVENOUS

## 2017-09-13 MED ORDER — FERROUS SULFATE 325 (65 FE) MG PO TABS: 325.0000 mg | ORAL_TABLET | Freq: Three times a day (TID) | ORAL | 3 refills | Status: DC

## 2017-09-13 MED ORDER — CEFAZOLIN SODIUM-DEXTROSE 2-4 GM/100ML-% IV SOLN
2.0000 g | Freq: Four times a day (QID) | INTRAVENOUS | Status: AC
  Administered 2017-09-13 (×2): 2 g via INTRAVENOUS
  Filled 2017-09-13 (×2): qty 100

## 2017-09-13 MED ORDER — ASPIRIN 81 MG PO CHEW
81.0000 mg | CHEWABLE_TABLET | Freq: Two times a day (BID) | ORAL | Status: DC
  Administered 2017-09-13: 81 mg via ORAL
  Filled 2017-09-13 (×2): qty 1

## 2017-09-13 MED ORDER — PROPOFOL 10 MG/ML IV BOLUS
INTRAVENOUS | Status: AC
  Filled 2017-09-13: qty 60

## 2017-09-13 MED ORDER — BUPIVACAINE-EPINEPHRINE (PF) 0.25% -1:200000 IJ SOLN
INTRAMUSCULAR | Status: AC
  Filled 2017-09-13: qty 30

## 2017-09-13 MED ORDER — PROPOFOL 500 MG/50ML IV EMUL: INTRAVENOUS | Status: DC | PRN

## 2017-09-13 MED ORDER — SODIUM CHLORIDE 0.9 % IR SOLN
Status: DC | PRN
  Administered 2017-09-13: 1000 mL

## 2017-09-13 MED ORDER — CHLORHEXIDINE GLUCONATE 4 % EX LIQD: 60.0000 mL | Freq: Once | CUTANEOUS | Status: DC

## 2017-09-13 MED ORDER — ONDANSETRON HCL 4 MG PO TABS
4.0000 mg | ORAL_TABLET | Freq: Four times a day (QID) | ORAL | Status: DC | PRN
  Administered 2017-09-14: 4 mg via ORAL
  Filled 2017-09-13: qty 1

## 2017-09-13 MED ORDER — BISACODYL 10 MG RE SUPP: 10.0000 mg | Freq: Every day | RECTAL | Status: DC | PRN

## 2017-09-13 MED ORDER — METOCLOPRAMIDE HCL 5 MG/ML IJ SOLN
5.0000 mg | Freq: Three times a day (TID) | INTRAMUSCULAR | Status: DC | PRN
  Administered 2017-09-13: 10 mg via INTRAVENOUS
  Filled 2017-09-13: qty 2

## 2017-09-13 MED ORDER — LACTATED RINGERS IV SOLN
INTRAVENOUS | Status: DC
  Administered 2017-09-13 (×2): via INTRAVENOUS

## 2017-09-13 MED ORDER — DEXAMETHASONE SODIUM PHOSPHATE 10 MG/ML IJ SOLN
10.0000 mg | Freq: Once | INTRAMUSCULAR | Status: AC
  Administered 2017-09-13: 10 mg via INTRAVENOUS

## 2017-09-13 MED ORDER — DIPHENHYDRAMINE HCL 12.5 MG/5ML PO ELIX: 12.5000 mg | ORAL_SOLUTION | ORAL | Status: DC | PRN

## 2017-09-13 MED ORDER — DEXAMETHASONE SODIUM PHOSPHATE 10 MG/ML IJ SOLN
10.0000 mg | Freq: Once | INTRAMUSCULAR | Status: AC
  Administered 2017-09-14: 10 mg via INTRAVENOUS
  Filled 2017-09-13: qty 1

## 2017-09-13 MED ORDER — METHOCARBAMOL 1000 MG/10ML IJ SOLN
500.0000 mg | Freq: Four times a day (QID) | INTRAVENOUS | Status: DC | PRN
  Administered 2017-09-13: 500 mg via INTRAVENOUS
  Filled 2017-09-13: qty 550

## 2017-09-13 MED ORDER — ZOLPIDEM TARTRATE 5 MG PO TABS: 5.0000 mg | ORAL_TABLET | Freq: Every evening | ORAL | Status: DC | PRN

## 2017-09-13 MED ORDER — KETOROLAC TROMETHAMINE 30 MG/ML IJ SOLN
INTRAMUSCULAR | Status: AC
  Filled 2017-09-13: qty 1

## 2017-09-13 MED ORDER — MIDAZOLAM HCL 2 MG/2ML IJ SOLN
1.0000 mg | Freq: Once | INTRAMUSCULAR | Status: AC
  Administered 2017-09-13: 1 mg via INTRAVENOUS

## 2017-09-13 MED ORDER — POLYETHYLENE GLYCOL 3350 17 G PO PACK
17.0000 g | PACK | Freq: Two times a day (BID) | ORAL | Status: DC
  Administered 2017-09-13 – 2017-09-14 (×2): 17 g via ORAL
  Filled 2017-09-13 (×2): qty 1

## 2017-09-13 MED ORDER — MORPHINE SULFATE (PF) 2 MG/ML IV SOLN: 0.5000 mg | INTRAVENOUS | Status: DC | PRN

## 2017-09-13 MED ORDER — FERROUS SULFATE 325 (65 FE) MG PO TABS
325.0000 mg | ORAL_TABLET | Freq: Two times a day (BID) | ORAL | Status: DC
  Administered 2017-09-14: 325 mg via ORAL
  Filled 2017-09-13 (×2): qty 1

## 2017-09-13 MED ORDER — METOCLOPRAMIDE HCL 5 MG PO TABS: 5.0000 mg | ORAL_TABLET | Freq: Three times a day (TID) | ORAL | Status: DC | PRN

## 2017-09-13 MED ORDER — CEFAZOLIN SODIUM-DEXTROSE 2-4 GM/100ML-% IV SOLN
2.0000 g | INTRAVENOUS | Status: AC
  Administered 2017-09-13: 2 g via INTRAVENOUS
  Filled 2017-09-13: qty 100

## 2017-09-13 SURGICAL SUPPLY — 57 items
ADH SKN CLS APL DERMABOND .7 (GAUZE/BANDAGES/DRESSINGS) ×1
BAG SPEC THK2 15X12 ZIP CLS (MISCELLANEOUS)
BAG ZIPLOCK 12X15 (MISCELLANEOUS) IMPLANT
BANDAGE ACE 6X5 VEL STRL LF (GAUZE/BANDAGES/DRESSINGS) ×3 IMPLANT
BLADE SAW SGTL 11.0X1.19X90.0M (BLADE) ×3 IMPLANT
BLADE SAW SGTL 13.0X1.19X90.0M (BLADE) ×3 IMPLANT
BOWL SMART MIX CTS (DISPOSABLE) ×3 IMPLANT
CAPT KNEE TOTAL 3 ATTUNE ×3 IMPLANT
CEMENT HV SMART SET (Cement) ×6 IMPLANT
COVER SURGICAL LIGHT HANDLE (MISCELLANEOUS) ×3 IMPLANT
CUFF TOURN SGL QUICK 34 (TOURNIQUET CUFF) ×3
CUFF TRNQT CYL 34X4X40X1 (TOURNIQUET CUFF) ×1 IMPLANT
DECANTER SPIKE VIAL GLASS SM (MISCELLANEOUS) ×6 IMPLANT
DERMABOND ADVANCED (GAUZE/BANDAGES/DRESSINGS) ×2
DERMABOND ADVANCED .7 DNX12 (GAUZE/BANDAGES/DRESSINGS) ×1 IMPLANT
DRAPE U-SHAPE 47X51 STRL (DRAPES) ×3 IMPLANT
DRESSING AQUACEL AG SP 3.5X10 (GAUZE/BANDAGES/DRESSINGS) ×1 IMPLANT
DRSG AQUACEL AG SP 3.5X10 (GAUZE/BANDAGES/DRESSINGS) ×3
DURAPREP 26ML APPLICATOR (WOUND CARE) ×6 IMPLANT
ELECT REM PT RETURN 15FT ADLT (MISCELLANEOUS) ×3 IMPLANT
GLOVE BIO SURGEON STRL SZ7.5 (GLOVE) ×2 IMPLANT
GLOVE BIOGEL M 7.0 STRL (GLOVE) IMPLANT
GLOVE BIOGEL PI IND STRL 7.0 (GLOVE) IMPLANT
GLOVE BIOGEL PI IND STRL 7.5 (GLOVE) ×1 IMPLANT
GLOVE BIOGEL PI IND STRL 8.5 (GLOVE) ×1 IMPLANT
GLOVE BIOGEL PI IND STRL 9 (GLOVE) IMPLANT
GLOVE BIOGEL PI INDICATOR 7.0 (GLOVE) ×6
GLOVE BIOGEL PI INDICATOR 7.5 (GLOVE) ×4
GLOVE BIOGEL PI INDICATOR 8.5 (GLOVE) ×2
GLOVE BIOGEL PI INDICATOR 9 (GLOVE) ×2
GLOVE ECLIPSE 8.0 STRL XLNG CF (GLOVE) ×3 IMPLANT
GLOVE ECLIPSE 8.5 STRL (GLOVE) ×2 IMPLANT
GLOVE ORTHO TXT STRL SZ7.5 (GLOVE) ×6 IMPLANT
GOWN SPEC L3 XXLG W/TWL (GOWN DISPOSABLE) ×2 IMPLANT
GOWN STRL REUS W/TWL 2XL LVL3 (GOWN DISPOSABLE) ×3 IMPLANT
GOWN STRL REUS W/TWL LRG LVL3 (GOWN DISPOSABLE) ×3 IMPLANT
GOWN STRL REUS W/TWL XL LVL3 (GOWN DISPOSABLE) ×4 IMPLANT
HANDPIECE INTERPULSE COAX TIP (DISPOSABLE) ×3
HOLDER FOLEY CATH W/STRAP (MISCELLANEOUS) IMPLANT
MANIFOLD NEPTUNE II (INSTRUMENTS) ×3 IMPLANT
NDL SAFETY ECLIPSE 18X1.5 (NEEDLE) IMPLANT
NEEDLE HYPO 18GX1.5 SHARP (NEEDLE)
PACK TOTAL KNEE CUSTOM (KITS) ×3 IMPLANT
POSITIONER SURGICAL ARM (MISCELLANEOUS) ×3 IMPLANT
SET HNDPC FAN SPRY TIP SCT (DISPOSABLE) ×1 IMPLANT
SET PAD KNEE POSITIONER (MISCELLANEOUS) ×3 IMPLANT
SUT MNCRL AB 4-0 PS2 18 (SUTURE) ×3 IMPLANT
SUT STRATAFIX PDS+ 0 24IN (SUTURE) ×3 IMPLANT
SUT VIC AB 1 CT1 36 (SUTURE) ×3 IMPLANT
SUT VIC AB 2-0 CT1 27 (SUTURE) ×9
SUT VIC AB 2-0 CT1 TAPERPNT 27 (SUTURE) ×3 IMPLANT
SYRINGE 3CC LL L/F (MISCELLANEOUS) IMPLANT
TRAY FOLEY CATH 14FRSI W/METER (CATHETERS) ×2 IMPLANT
TRAY FOLEY MTR SLVR 16FR STAT (SET/KITS/TRAYS/PACK) ×1 IMPLANT
WATER STERILE IRR 1000ML POUR (IV SOLUTION) ×3 IMPLANT
WRAP KNEE MAXI GEL POST OP (GAUZE/BANDAGES/DRESSINGS) ×3 IMPLANT
YANKAUER SUCT BULB TIP 10FT TU (MISCELLANEOUS) ×3 IMPLANT

## 2017-09-13 NOTE — Anesthesia Procedure Notes (Signed)
Date/Time: 09/13/2017 8:33 AM Performed by: Sharlette Dense, CRNA Oxygen Delivery Method: Simple face mask

## 2017-09-13 NOTE — Progress Notes (Signed)
Care Plan Notes 06/15/2017 to 09/13/2017       Care Plan by Leonides Grills A at 09/07/2017 10:43 AM    Date of Service   Author Author Type Status Note Type File Time  09/07/2017  Felicia Hunter  Signed Care Plan 09/07/2017             Ortho Bundle R TKA scheduled on 09-13-17 DCP:  Home with dtr.  2 story/2 ste. DME:  Has a RW and 3-in-1 PT:  [facility].  Eval scheduled for 09-19-17.

## 2017-09-13 NOTE — Anesthesia Procedure Notes (Signed)
Date/Time: 09/13/2017 7:44 AM Performed by: Sharlette Dense, CRNA Oxygen Delivery Method: Nasal cannula

## 2017-09-13 NOTE — Anesthesia Preprocedure Evaluation (Signed)
Anesthesia Evaluation  Patient identified by MRN, date of birth, ID band Patient awake    Reviewed: Allergy & Precautions, NPO status   Airway Mallampati: II  TM Distance: >3 FB     Dental   Pulmonary neg pulmonary ROS,    breath sounds clear to auscultation       Cardiovascular + dysrhythmias Atrial Fibrillation (-) Valvular Problems/Murmurs Rhythm:Regular Rate:Normal     Neuro/Psych    GI/Hepatic Neg liver ROS, GERD  ,  Endo/Other  negative endocrine ROS  Renal/GU negative Renal ROS     Musculoskeletal  (+) Arthritis ,   Abdominal   Peds  Hematology  (+) anemia ,   Anesthesia Other Findings   Reproductive/Obstetrics                             Anesthesia Physical Anesthesia Plan  ASA: III  Anesthesia Plan: Spinal   Post-op Pain Management:  Regional for Post-op pain   Induction: Intravenous  PONV Risk Score and Plan: Treatment may vary due to age or medical condition, Ondansetron, Dexamethasone and Midazolam  Airway Management Planned: Nasal Cannula and Simple Face Mask  Additional Equipment:   Intra-op Plan:   Post-operative Plan:   Informed Consent: I have reviewed the patients History and Physical, chart, labs and discussed the procedure including the risks, benefits and alternatives for the proposed anesthesia with the patient or authorized representative who has indicated his/her understanding and acceptance.   Dental advisory given  Plan Discussed with: CRNA and Anesthesiologist  Anesthesia Plan Comments:         Anesthesia Quick Evaluation

## 2017-09-13 NOTE — Op Note (Signed)
NAME:  [name] RECORD NO.:  [id number]                             FACILITY:  [facility]      PHYSICIAN:  Pietro Cassis. Alvan Dame, M.D.  DATE OF BIRTH:  June 27, 1940      DATE OF PROCEDURE:  09/13/2017                                     OPERATIVE REPORT         PREOPERATIVE DIAGNOSIS:  Right knee osteoarthritis.      POSTOPERATIVE DIAGNOSIS:  Right knee osteoarthritis.      FINDINGS:  The patient was noted to have complete loss of cartilage and   bone-on-bone arthritis with associated osteophytes in the medial and patellofemoral compartments of   the knee with a significant synovitis and associated effusion.  The patient had failed months of conservative treatment including medications, injection therapy, activity modification.     PROCEDURE:  Right total knee replacement.      COMPONENTS USED:  DePuy Attune rotating platform posterior stabilized knee   system, a size 5N femur, 4 tibia, size 10 mm PS AOX insert, and 38 anatomic patellar   button.      SURGEON:  Pietro Cassis. Alvan Dame, M.D.      ASSISTANT:  Felicia Orleans, PA-C.      ANESTHESIA:  Regional and Spinal.      SPECIMENS:  None.      COMPLICATION:  None.      DRAINS:  None.  EBL: <100      TOURNIQUET TIME:   Total Tourniquet Time Documented: Thigh (Right) - 23 minutes Total: Thigh (Right) - 23 minutes  .      The patient was stable to the recovery room.      INDICATION FOR PROCEDURE:  Felicia Hunter is a 77 y.o. female patient of   mine.  The patient had been seen, evaluated, and treated for months conservatively in the   office with medication, activity modification, and injections.  The patient had   radiographic changes of bone-on-bone arthritis with endplate sclerosis and osteophytes noted.  Based on the radiographic changes and failed conservative measures, the patient   decided to proceed with definitive treatment, total knee replacement.  Risks of infection, DVT, component failure,  need for revision surgery, neurovascular injury were reviewed in the office setting.  The postop course was reviewed stressing the efforts to maximize post-operative satisfaction and function.  Consent was obtained for benefit of pain   relief.      PROCEDURE IN DETAIL:  The patient was brought to the operative theater.   Once adequate anesthesia, preoperative antibiotics, 2 gm of Ancef,1 gm of Tranexamic Acid, and 10 mg of Decadron administered, the patient was positioned supine with a right thigh tourniquet placed.  The  right lower extremity was prepped and draped in sterile fashion.  A time-   out was performed identifying the patient, planned procedure, and the appropriate extremity.      The right lower extremity was placed in the [facility] leg holder.  The leg was   exsanguinated, tourniquet elevated to 250 mmHg.  A midline incision was   made  followed by median parapatellar arthrotomy.  Following initial   exposure, attention was first directed to the patella.  Precut   measurement was noted to be 21 mm.  I resected down to 13 mm and used a   38 anatomic patellar button to restore patellar height as well as cover the cut surface.      The lug holes were drilled and a metal shim was placed to protect the   patella from retractors and saw blade during the procedure.      At this point, attention was now directed to the femur.  The femoral   canal was opened with a drill, irrigated to try to prevent fat emboli.  An   intramedullary rod was passed at 5 degrees valgus, 9 mm of bone was   resected off the distal femur.  Following this resection, the tibia was   subluxated anteriorly.  Using the extramedullary guide, 2 mm of bone was resected off   the proximal medial tibia.  We confirmed the gap would be   stable medially and laterally with a size 6 spacer block as well as confirmed that the tibial cut was perpendicular in the coronal plane, checking with an alignment rod.      Once this was  done, I sized the femur to be a size 5 in the anterior-   posterior dimension, chose a narrow component based on medial and   lateral dimension.  The size 5 rotation block was then pinned in   position anterior referenced using the C-clamp to set rotation.  The   anterior, posterior, and  chamfer cuts were made without difficulty nor   notching making certain that I was along the anterior cortex to help   with flexion gap stability.      The final box cut was made off the lateral aspect of distal femur.      At this point, the tibia was sized to be a size 4.  The size 4 tray was   then pinned in position through the medial third of the tubercle,   drilled, and keel punched.  Trial reduction was now carried with a 5 femur,  4 tibia, up to the size 10 mm PS insert, and the 38 anatomic patella botton.  The knee was brought to full extension with good flexion stability with the patella   tracking through the trochlea without application of pressure.  Given   all these findings the trial components removed.  Final components were   opened and cement was mixed.  The knee was irrigated with normal saline solution and pulse lavage.  The synovial lining was   then injected with 30 cc of 0.25% Marcaine with epinephrine, 1 cc of Toradol and 30 cc of NS for a total of 61 cc.     Final implants were then cemented onto cleaned and dried cut surfaces of bone with the knee brought to extension with a size 10 mm PS trial insert.      Once the cement had fully cured, excess cement was removed   throughout the knee.  I confirmed that I was satisfied with the range of   motion and stability, and the final size 10 mm PS AOX insert was chosen.  It was   placed into the knee.      The tourniquet had been let down at 23 minutes.  No significant   hemostasis was required.  The extensor mechanism was then reapproximated using #1  Vicryl and #1 Stratafix sutures with the knee   in flexion.  The   remaining wound  was closed with 2-0 Vicryl and running 4-0 Monocryl.   The knee was cleaned, dried, dressed sterilely using Dermabond and   Aquacel dressing.  The patient was then   brought to recovery room in stable condition, tolerating the procedure   well.   Please note that Physician Assistant, Felicia Orleans, PA-C was present for the entirety of the case, and was utilized for pre-operative positioning, peri-operative retractor management, general facilitation of the procedure and for primary wound closure at the end of the case.              Pietro Cassis Alvan Dame, M.D.    09/13/2017 10:17 AM

## 2017-09-13 NOTE — Anesthesia Procedure Notes (Signed)
Anesthesia Regional Block: Adductor canal block   Pre-Anesthetic Checklist: ,, timeout performed, Correct Patient, Correct Site, Correct Laterality, Correct Procedure, Correct Position, site marked, Risks and benefits discussed, Surgical consent,  Pre-op evaluation,  At surgeon's request  Laterality: Right  Prep: chloraprep       Needles:   Needle Type: Stimulator Needle - 80          Additional Needles:   Procedures: Doppler guided, nerve stimulator,,, ultrasound used (permanent image in chart),,,,  Narrative:  Start time: 09/13/2017 7:55 AM End time: 09/13/2017 8:10 AM Injection made incrementally with aspirations every 5 mL.  Performed by: Personally  Anesthesiologist: Belinda Block, MD

## 2017-09-13 NOTE — Discharge Instructions (Signed)

## 2017-09-13 NOTE — Transfer of Care (Signed)
Immediate Anesthesia Transfer of Care Note  Patient: Felicia Hunter  Procedure(s) Performed: RIGHT TOTAL KNEE ARTHROPLASTY (Right Knee)  Patient Location: PACU  Anesthesia Type:Spinal  Level of Consciousness: awake, alert  and oriented  Airway & Oxygen Therapy: Patient Spontanous Breathing and Patient connected to face mask oxygen  Post-op Assessment: Report given to RN and Post -op Vital signs reviewed and stable  Post vital signs: Reviewed and stable  Last Vitals:  Vitals Value Taken Time  BP 117/69 09/13/2017 10:20 AM  Temp    Pulse 70 09/13/2017 10:22 AM  Resp 19 09/13/2017 10:22 AM  SpO2 100 % 09/13/2017 10:22 AM  Vitals shown include unvalidated device data.  Last Pain:  Vitals:   09/13/17 0656  TempSrc:   PainSc: 0-No pain         Complications: No apparent anesthesia complications

## 2017-09-13 NOTE — Anesthesia Procedure Notes (Signed)
Anesthesia Regional Block: Narrative:       

## 2017-09-13 NOTE — Anesthesia Postprocedure Evaluation (Signed)
Anesthesia Post Note  Patient: Felicia Hunter  Procedure(s) Performed: RIGHT TOTAL KNEE ARTHROPLASTY (Right Knee)     Patient location during evaluation: PACU Anesthesia Type: Spinal Level of consciousness: awake Pain management: pain level controlled Vital Signs Assessment: post-procedure vital signs reviewed and stable Respiratory status: spontaneous breathing Cardiovascular status: stable Anesthetic complications: no    Last Vitals:  Vitals:   09/13/17 1757 09/13/17 2049  BP: 121/76 129/80  Pulse: (!) 52 (!) 57  Resp: 10 16  Temp: (!) 36.3 C (!) 36.3 C  SpO2: 99% 96%    Last Pain:  Vitals:   09/13/17 2049  TempSrc: Oral  PainSc:                  [name]

## 2017-09-13 NOTE — Evaluation (Signed)
Physical Therapy Evaluation Patient Details Name: Felicia Hunter MRN: [id number] DOB: Aug 14, 1940 Today's Date: 09/13/2017   History of Present Illness  R TKA, h/o bilateral  DATHA  Clinical Impression  The patient tolerated very well. Ambulated x 50'. [name] Pt will benefit from skilled PT to increase their independence and safety with mobility to allow discharge to the venue listed below.       Follow Up Recommendations Follow surgeon's recommendation for DC plan and follow-up therapies;Outpatient PT    Equipment Recommendations  None recommended by PT    Recommendations for Other Services       Precautions / Restrictions Precautions Precautions: Knee;Fall      Mobility  Bed Mobility Overal bed mobility: Needs Assistance Bed Mobility: Supine to Sit     Supine to sit: Min assist     General bed mobility comments: support the right leg  Transfers Overall transfer level: Needs assistance Equipment used: Rolling walker (2 wheeled) Transfers: Sit to/from Stand Sit to Stand: Min assist         General transfer comment: cues for Hand and right leg position  Ambulation/Gait Ambulation/Gait assistance: Min assist Gait Distance (Feet): 50 Feet Assistive device: Rolling walker (2 wheeled) Gait Pattern/deviations: Step-to pattern;Step-through pattern;Antalgic;Decreased stance time - right     General Gait Details: cues for sequence.  Stairs            Wheelchair Mobility    Modified Rankin (Stroke Patients Only)       Balance                                             Pertinent Vitals/Pain Pain Assessment: 0-10 Pain Score: 4  Pain Location: right knee Pain Descriptors / Indicators: Aching;Discomfort;Grimacing Pain Intervention(s): Monitored during session;Patient requesting pain meds-RN notified;Ice  applied;Premedicated before session    Home Living Family/patient expects to be discharged to:: Private residence Living Arrangements: Children;Other relatives Available Help at Discharge: Family Type of Home: House Home Access: Stairs to enter Entrance Stairs-Rails: Right Entrance Stairs-Number of Steps: 2-3 Home Layout: Two level;Able to live on main level with bedroom/bathroom Home Equipment: Gilford Rile - 2 wheels;Cane - single point;Bedside commode Additional Comments: gransddaughter and daughter to stay    Prior Function Level of Independence: Independent               Hand Dominance        Extremity/Trunk Assessment   Upper Extremity Assessment Upper Extremity Assessment: Overall WFL for tasks assessed    Lower Extremity Assessment Lower Extremity Assessment: RLE deficits/detail RLE Deficits / Details: able to perform SLR knee flexionto 60    Cervical / Trunk Assessment Cervical / Trunk Assessment: Normal  Communication   Communication: No difficulties  Cognition Arousal/Alertness: Awake/alert Behavior During Therapy: WFL for tasks assessed/performed Overall Cognitive Status: Within Functional Limits for tasks assessed                                        General Comments      Exercises     Assessment/Plan    PT Assessment Patient needs continued PT services  PT Problem List Decreased strength;Decreased range of motion;Decreased knowledge of  use of DME;Decreased activity tolerance;Decreased safety awareness;Decreased knowledge of precautions;Decreased mobility;Pain       PT Treatment Interventions DME instruction;Therapeutic exercise;Gait training;Stair training;Functional mobility training;Therapeutic activities;Patient/family education    PT Goals (Current goals can be found in the Care Plan section)  Acute Rehab PT Goals Patient Stated Goal: to go home PT Goal Formulation: With patient Time For Goal Achievement:  09/17/17 Potential to Achieve Goals: Good    Frequency 7X/week   Barriers to discharge        Co-evaluation               AM-PAC PT "6 Clicks" Daily Activity  Outcome Measure Difficulty turning over in bed (including adjusting bedclothes, sheets and blankets)?: A Lot Difficulty moving from lying on back to sitting on the side of the bed? : A Lot Difficulty sitting down on and standing up from a chair with arms (e.g., wheelchair, bedside commode, etc,.)?: A Lot Help needed moving to and from a bed to chair (including a wheelchair)?: A Lot Help needed walking in hospital room?: A Lot Help needed climbing 3-5 steps with a railing? : Total 6 Click Score: 11    End of Session Equipment Utilized During Treatment: Gait belt Activity Tolerance: Patient tolerated treatment well Patient left: in chair;with call bell/phone within reach Nurse Communication: Mobility status PT Visit Diagnosis: Unsteadiness on feet (R26.81);Difficulty in walking, not elsewhere classified (R26.2);Pain Pain - Right/Left: Right Pain - part of body: Knee    Time: 1550-1601 PT Time Calculation (min) (ACUTE ONLY): 11 min   Charges:   PT Evaluation $PT Eval Low Complexity: 1 Low     PT G CodesTresa Endo PT [phone]   Claretha Cooper 09/13/2017, 5:46 PM

## 2017-09-13 NOTE — Interval H&P Note (Signed)
History and Physical Interval Note:  09/13/2017 7:29 AM  Felicia Hunter  has presented today for surgery, with the diagnosis of Right knee osteoarthritis  The various methods of treatment have been discussed with the patient and family. After consideration of risks, benefits and other options for treatment, the patient has consented to  Procedure(s) with comments: RIGHT TOTAL KNEE ARTHROPLASTY (Right) - 70 mins as a surgical intervention .  The patient's history has been reviewed, patient examined, no change in status, stable for surgery.  I have reviewed the patient's chart and labs.  Questions were answered to the patient's satisfaction.     [name]

## 2017-09-14 ENCOUNTER — Encounter (HOSPITAL_COMMUNITY): Payer: Self-pay | Admitting: Orthopedic Surgery

## 2017-09-14 DIAGNOSIS — Z853 Personal history of malignant neoplasm of breast: Secondary | ICD-10-CM | POA: Diagnosis not present

## 2017-09-14 DIAGNOSIS — M25461 Effusion, right knee: Secondary | ICD-10-CM | POA: Diagnosis not present

## 2017-09-14 DIAGNOSIS — Z8673 Personal history of transient ischemic attack (TIA), and cerebral infarction without residual deficits: Secondary | ICD-10-CM | POA: Diagnosis not present

## 2017-09-14 DIAGNOSIS — M659 Synovitis and tenosynovitis, unspecified: Secondary | ICD-10-CM | POA: Diagnosis not present

## 2017-09-14 DIAGNOSIS — M25761 Osteophyte, right knee: Secondary | ICD-10-CM | POA: Diagnosis not present

## 2017-09-14 DIAGNOSIS — M1711 Unilateral primary osteoarthritis, right knee: Secondary | ICD-10-CM | POA: Diagnosis not present

## 2017-09-14 LAB — CBC
HEMATOCRIT: 36.4 % (ref 36.0–46.0)
Hemoglobin: 11.6 g/dL — ABNORMAL LOW (ref 12.0–15.0)
MCH: 31.3 pg (ref 26.0–34.0)
MCHC: 31.9 g/dL (ref 30.0–36.0)
MCV: 98.1 fL (ref 78.0–100.0)
Platelets: 165 10*3/uL (ref 150–400)
RBC: 3.71 MIL/uL — ABNORMAL LOW (ref 3.87–5.11)
RDW: 13.1 % (ref 11.5–15.5)
WBC: 10.2 10*3/uL (ref 4.0–10.5)

## 2017-09-14 LAB — BASIC METABOLIC PANEL WITH GFR
Anion gap: 9 (ref 5–15)
BUN: 9 mg/dL (ref 8–23)
CO2: 21 mmol/L — ABNORMAL LOW (ref 22–32)
Calcium: 7.8 mg/dL — ABNORMAL LOW (ref 8.9–10.3)
Chloride: 100 mmol/L (ref 98–111)
Creatinine, Ser: 0.49 mg/dL (ref 0.44–1.00)
GFR calc Af Amer: >60 mL/min
GFR calc non Af Amer: >60 mL/min
Glucose, Bld: 99 mg/dL (ref 70–99)
Potassium: 3.7 mmol/L (ref 3.5–5.1)
Sodium: 130 mmol/L — ABNORMAL LOW (ref 135–145)

## 2017-09-14 MED ORDER — KETOROLAC TROMETHAMINE 15 MG/ML IJ SOLN: 7.5000 mg | Freq: Four times a day (QID) | INTRAMUSCULAR | Status: DC

## 2017-09-14 MED ORDER — TRAMADOL HCL 50 MG PO TABS: 50.0000 mg | ORAL_TABLET | Freq: Four times a day (QID) | ORAL | 0 refills | Status: DC | PRN

## 2017-09-14 MED ORDER — ONDANSETRON HCL 4 MG PO TABS: 4.0000 mg | ORAL_TABLET | Freq: Four times a day (QID) | ORAL | 0 refills | Status: DC | PRN

## 2017-09-14 MED ORDER — RIVAROXABAN 10 MG PO TABS
10.0000 mg | ORAL_TABLET | Freq: Every day | ORAL | Status: DC
  Administered 2017-09-14: 10 mg via ORAL
  Filled 2017-09-14: qty 1

## 2017-09-14 NOTE — Care Management Obs Status (Signed)
[place] NOTIFICATION   Patient Details  Name: AUDRIS SPEAKER MRN: [id number] Date of Birth: [birth year]   Medicare Observation Status Notification Given:  Yes    Guadalupe Maple, RN 09/14/2017, 9:22 AM

## 2017-09-14 NOTE — Progress Notes (Signed)
RN reviewed discharge instructions with patient and family. All questions answered.   Paperwork and prescriptions given.   NT rolled patient down with granddaughter to family car.

## 2017-09-14 NOTE — Progress Notes (Signed)
Patient ID: Felicia Hunter, female   DOB: September 19, 1940, 77 y.o.   MRN: [id number] Subjective: 1 Day Post-Op Procedure(s) (LRB): RIGHT TOTAL KNEE ARTHROPLASTY (Right)    Patient reports pain as moderate associated with nausea, unfortunately.  Recalls this from past experience  Objective:   VITALS:   Vitals:   09/14/17 0211 09/14/17 0543  BP: 118/79 (!) 132/92  Pulse: (!) 56 (!) 103  Resp: 15 14  Temp: 97.6 F (36.4 C) (!) 97.5 F (36.4 C)  SpO2: 98% 100%    Neurovascular intact Incision: dressing C/D/I  LABS Recent Labs    09/14/17 0524  HGB 11.6*  HCT 36.4  WBC 10.2  PLT 165    No results for input(s): NA, K, BUN, CREATININE, GLUCOSE in the last 72 hours.  No results for input(s): LABPT, INR in the last 72 hours.   Assessment/Plan: 1 Day Post-Op Procedure(s) (LRB): RIGHT TOTAL KNEE ARTHROPLASTY (Right)   Advance diet Up with therapy   Will add some Toradol 7.5mg  today while here to help with pain D/C on celebrex, tramadol, tylenol, zofran RTC in 2 weeks Reviewed goals

## 2017-09-14 NOTE — Progress Notes (Signed)
Physical Therapy Treatment Patient Details Name: Felicia Hunter MRN: [id number] DOB: 23-Nov-1940 Today's Date: 09/14/2017    History of Present Illness R TKA, h/o bilateral  DATHA    PT Comments    Patient is progressing well. Plans DC after PM PT and steps are [practiced.  Follow Up Recommendations  Follow surgeon's recommendation for DC plan and follow-up therapies;Outpatient PT     Equipment Recommendations  None recommended by PT    Recommendations for Other Services       Precautions / Restrictions Precautions Precautions: Knee;Fall    Mobility  Bed Mobility Overal bed mobility: Needs Assistance Bed Mobility: Supine to Sit     Supine to sit: Supervision     General bed mobility comments: patient self assisted  rt leg  Transfers   Equipment used: Rolling walker (2 wheeled) Transfers: Sit to/from Stand Sit to Stand: Min guard         General transfer comment: cues for Hand and right leg position  Ambulation/Gait Ambulation/Gait assistance: Min assist Gait Distance (Feet): 150 Feet Assistive device: Rolling walker (2 wheeled) Gait Pattern/deviations: Step-to pattern;Step-through pattern;Antalgic;Decreased stance time - right     General Gait Details: cues for sequence.   Stairs             Wheelchair Mobility    Modified Rankin (Stroke Patients Only)       Balance                                            Cognition Arousal/Alertness: Awake/alert                                            Exercises Total Joint Exercises Ankle Circles/Pumps: AROM;Both;10 reps Quad Sets: AROM;Both;10 reps Short Arc QuadSinclair Ship;Right;10 reps Heel Slides: AAROM;Right;10 reps Hip ABduction/ADduction: AAROM;Right;10 reps Straight Leg Raises: AAROM;Right;10 reps    General Comments        Pertinent Vitals/Pain Pain Score: 4  Pain Location: right knee Pain Descriptors / Indicators:  Aching;Discomfort;Grimacing Pain Intervention(s): Monitored during session;Premedicated before session;Ice applied    Home Living                      Prior Function            PT Goals (current goals can now be found in the care plan section) Progress towards PT goals: Progressing toward goals    Frequency           PT Plan Current plan remains appropriate    Co-evaluation              AM-PAC PT "6 Clicks" Daily Activity  Outcome Measure  Difficulty turning over in bed (including adjusting bedclothes, sheets and blankets)?: A Little Difficulty moving from lying on back to sitting on the side of the bed? : A Little Difficulty sitting down on and standing up from a chair with arms (e.g., wheelchair, bedside commode, etc,.)?: A Little Help needed moving to and from a bed to chair (including a wheelchair)?: A Little Help needed walking in hospital room?: A Little Help needed climbing 3-5 steps with a railing? : A Lot 6 Click Score: 17    End of Session   Activity Tolerance: Patient tolerated treatment  well Patient left: in chair;with call bell/phone within reach Nurse Communication: Mobility status PT Visit Diagnosis: Unsteadiness on feet (R26.81);Difficulty in walking, not elsewhere classified (R26.2);Pain Pain - Right/Left: Right     Time: 1030-1109 PT Time Calculation (min) (ACUTE ONLY): 39 min  Charges:  $Gait Training: 8-22 mins $Therapeutic Exercise: 8-22 mins $Self Care/Home Management: 8-22                    G CodesTresa Endo PT [phone]    Claretha Cooper 09/14/2017, 12:25 PM

## 2017-09-14 NOTE — Progress Notes (Signed)
Physical Therapy Treatment Patient Details Name: BRIDGITTE FELICETTI MRN: [id number] DOB: 08-May-1940 Today's Date: 09/14/2017    History of Present Illness R TKA, h/o bilateral  DATHA    PT Comments    Patient is ready for DC. PT goals met.   Follow Up Recommendations  Follow surgeon's recommendation for DC plan and follow-up therapies;Outpatient PT     Equipment Recommendations  None recommended by PT    Recommendations for Other Services       Precautions / Restrictions Precautions Precautions: Knee;Fall    Mobility  Bed Mobility Overal bed mobility: Needs Assistance Bed Mobility: Supine to Sit;Sit to Supine     Supine to sit: Supervision Sit to supine: Supervision   General bed mobility comments: patient self assisted  rt leg  Transfers   Equipment used: Rolling walker (2 wheeled) Transfers: Sit to/from Stand Sit to Stand: Supervision         General transfer comment: cues for Hand and right leg position  Ambulation/Gait Ambulation/Gait assistance: Supervision Gait Distance (Feet): 60 Feet Assistive device: Rolling walker (2 wheeled) Gait Pattern/deviations: Step-to pattern;Step-through pattern;Antalgic;Decreased stance time - right     General Gait Details: cues for sequence.   Stairs Stairs: Yes Stairs assistance: Min assist Stair Management: One rail Right;Step to pattern;Forwards Number of Stairs: 2 General stair comments: cues for safety and  family assist   Wheelchair Mobility    Modified Rankin (Stroke Patients Only)       Balance                                            Cognition Arousal/Alertness: Awake/alert                                            Exercises Total Joint Exercises Ankle Circles/Pumps: AROM;Both;10 reps Quad Sets: AROM;Both;10 reps Short Arc QuadSinclair Ship;Right;10 reps Heel Slides: AAROM;Right;10 reps Hip ABduction/ADduction: AAROM;Right;10 reps Straight Leg Raises:  AAROM;Right;10 reps    General Comments        Pertinent Vitals/Pain Pain Score: 4  Pain Location: right knee Pain Descriptors / Indicators: Aching;Discomfort;Grimacing Pain Intervention(s): Patient requesting pain meds-RN notified;Ice applied    Home Living                      Prior Function            PT Goals (current goals can now be found in the care plan section) Progress towards PT goals: Progressing toward goals    Frequency           PT Plan Current plan remains appropriate    Co-evaluation              AM-PAC PT "6 Clicks" Daily Activity  Outcome Measure  Difficulty turning over in bed (including adjusting bedclothes, sheets and blankets)?: A Little Difficulty moving from lying on back to sitting on the side of the bed? : A Little Difficulty sitting down on and standing up from a chair with arms (e.g., wheelchair, bedside commode, etc,.)?: A Little Help needed moving to and from a bed to chair (including a wheelchair)?: A Little Help needed walking in hospital room?: A Little Help needed climbing 3-5 steps with a railing? : A Little 6 Click  Score: 18    End of Session   Activity Tolerance: Patient tolerated treatment well Patient left: in bed Nurse Communication: Mobility status PT Visit Diagnosis: Unsteadiness on feet (R26.81);Difficulty in walking, not elsewhere classified (R26.2);Pain Pain - Right/Left: Right     Time: 9611-6435 PT Time Calculation (min) (ACUTE ONLY): 26 min  Charges:  $Gait Training: 23-37 mins $Therapeutic Exercise: 8-22 mins $Self Care/Home Management: 2022/12/03                    G Codes:          Claretha Cooper 09/14/2017, 3:31 PM

## 2017-09-19 DIAGNOSIS — M25661 Stiffness of right knee, not elsewhere classified: Secondary | ICD-10-CM | POA: Diagnosis not present

## 2017-09-19 NOTE — Discharge Summary (Signed)
Physician Discharge Summary  Patient ID: Felicia Hunter MRN: [id number] DOB/AGE: 09-07-1940 77 y.o.  Admit date: 09/13/2017 Discharge date: 09/14/2017   Procedures:  Procedure(s) (LRB): RIGHT TOTAL KNEE ARTHROPLASTY (Right)  Attending Physician:  Dr. Paralee Cancel   Admission Diagnoses:   Right knee primary OA / pain  Discharge Diagnoses:  Principal Problem:   S/P right TKA Active Problems:   S/P total knee replacement  Past Medical History:  Diagnosis Date  . Alopecia   . Arthritis   . Atrial fibrillation ([place])   . Breast cancer ([place])    double mastectomy 2011  . Clavicle fracture    right clavicle-hardware retained  . Clotting disorder ([place])    with CVA- 2013, 2014  . Cough    PERSISTANT COUGH X 20 YRS  . Difficulty sleeping    takes Ambien  . Disorders of bilirubin excretion   . Diverticulosis   . Environmental and seasonal allergies    "dry cough", related to ? allergies  . GERD (gastroesophageal reflux disease)   . Hyperlipidemia   . MVP (mitral valve prolapse)   . Osteopenia   . Osteoporosis   . Stroke ([place]) 06/07/11   x2 episodes-"funny feeling"" equilibrium off x24 hours, x1 speech affected- no residuallast 1 yr ago.  . Tubulovillous adenoma of colon 08/2003    HPI:    Felicia Hunter, 77 y.o. female, has a history of pain and functional disability in the right knee due to arthritis and has failed non-surgical conservative treatments for greater than 12 weeks to include NSAID's and/or analgesics, corticosteriod injections and activity modification.  Onset of symptoms was gradual, starting ~1 years ago with gradually worsening course since that time. The patient noted no past surgery on the right knee(s).  Patient currently rates pain in the right knee(s) at 8 out of 10 with activity. Patient has worsening of pain with activity and weight bearing, pain that interferes with activities of daily living, pain with passive range of motion, crepitus and joint swelling.   Patient has evidence of periarticular osteophytes and joint space narrowing by imaging studies.  There is no active infection.   Risks, benefits and expectations were discussed with the patient.  Risks including but not limited to the risk of anesthesia, blood clots, nerve damage, blood vessel damage, failure of the prosthesis, infection and up to and including death.  Patient understand the risks, benefits and expectations and wishes to proceed with surgery.   PCP: Deland Pretty, MD   Discharged Condition: good  Hospital Course:  Patient underwent the above stated procedure on 09/13/2017. Patient tolerated the procedure well and brought to the recovery room in good condition and subsequently to the floor.  POD #1 BP: 132/92 ; Pulse: 103 ; Temp: 97.5 F (36.4 C) ; Resp: 14 Patient reports pain as moderate associated with nausea, unfortunately.  Recalls this from past experience. Neurovascular intact and incision: dressing C/D/I.   LABS  Basename    HGB     11.6  HCT     36.4    Discharge Exam: General appearance: alert, cooperative and no distress Extremities: Homans sign is negative, no sign of DVT, no edema, redness or tenderness in the calves or thighs and no ulcers, gangrene or trophic changes  Disposition:  Home with follow up in 2 weeks   Follow-up Information    Paralee Cancel, MD. Schedule an appointment as soon as possible for a visit in 2 weeks.   Specialty:  Orthopedic Surgery  Contact information: [address] [place] [address] [phone]           Discharge Instructions    Call MD / Call 911   Complete by:  As directed    If you experience chest pain or shortness of breath, CALL 911 and be transported to the hospital emergency room.  If you develope a fever above 101 F, pus (white drainage) or increased drainage or redness at the wound, or calf pain, call your surgeon's office.   Change dressing   Complete by:  As directed    Maintain  surgical dressing until follow up in the clinic. If the edges start to pull up, may reinforce with tape. If the dressing is no longer working, may remove and cover with gauze and tape, but must keep the area dry and clean.  Call with any questions or concerns.   Constipation Prevention   Complete by:  As directed    Drink plenty of fluids.  Prune juice may be helpful.  You may use a stool softener, such as Colace (over the counter) 100 mg twice a day.  Use MiraLax (over the counter) for constipation as needed.   Diet - low sodium heart healthy   Complete by:  As directed    Discharge instructions   Complete by:  As directed    Maintain surgical dressing until follow up in the clinic. If the edges start to pull up, may reinforce with tape. If the dressing is no longer working, may remove and cover with gauze and tape, but must keep the area dry and clean.  Follow [facility] in 2 [facility]. Call with any questions or concerns.   Increase activity slowly as tolerated   Complete by:  As directed    Weight bearing as tolerated with assist device (walker, cane, etc) as directed, use it as long as suggested by your surgeon or therapist, typically at least 4-6 weeks.   TED hose   Complete by:  As directed    Use stockings (TED hose) for 2 weeks on both leg(s).  You may remove them at night for sleeping.      Allergies as of 09/14/2017      Reactions   Sulfonamide Derivatives Rash      Medication List    TAKE these medications   alendronate 70 MG tablet Commonly known as:  FOSAMAX Take 70 mg by mouth every Sunday.   atorvastatin 20 MG tablet Commonly known as:  LIPITOR Take 20 mg by mouth every evening.   BIOTIN PO Take 1 tablet by mouth daily.   CALCIUM 600/VITAMIN D3 PO Take 1 tablet by mouth daily.   cholecalciferol 1000 units tablet Commonly known as:  VITAMIN D Take 1,000 Units by mouth daily.   docusate sodium 100 MG capsule Commonly known as:  COLACE Take 1  capsule (100 mg total) by mouth 2 (two) times daily.   ferrous sulfate 325 (65 FE) MG tablet Commonly known as:  FERROUSUL Take 1 tablet (325 mg total) by mouth 3 (three) times daily with meals.   FISH OIL PO Take 1 capsule by mouth 2 (two) times daily.   methocarbamol 500 MG tablet Commonly known as:  ROBAXIN Take 1 tablet (500 mg total) by mouth every 6 (six) hours as needed for muscle spasms.   ondansetron 4 MG tablet Commonly known as:  ZOFRAN Take 1 tablet (4 mg total) by mouth every 6 (six) hours as needed for  nausea.   polyethylene glycol packet Commonly known as:  MIRALAX / GLYCOLAX Take 17 g by mouth 2 (two) times daily. Notes to patient:  Laxative powder   polyvinyl alcohol 1.4 % ophthalmic solution Commonly known as:  LIQUIFILM TEARS Place 1 drop into both eyes as needed for dry eyes.   traMADol 50 MG tablet Commonly known as:  ULTRAM Take 1-2 tablets (50-100 mg total) by mouth every 6 (six) hours as needed for moderate pain or severe pain.   vitamin C 500 MG tablet Commonly known as:  ASCORBIC ACID Take 500 mg by mouth daily as needed (for cold symptoms).   XARELTO 20 MG Tabs tablet Generic drug:  rivaroxaban TAKE 1 TABLET ONCE DAILY WITH SUPPER.   zolpidem 10 MG tablet Commonly known as:  AMBIEN Take 10 mg by mouth at bedtime as needed for sleep.            Discharge Care Instructions  (From admission, onward)        Start     Ordered   09/14/17 0000  Change dressing    Comments:  Maintain surgical dressing until follow up in the clinic. If the edges start to pull up, may reinforce with tape. If the dressing is no longer working, may remove and cover with gauze and tape, but must keep the area dry and clean.  Call with any questions or concerns.   09/14/17 0745       Signed: [place] Pugh. Aariana Shankland   PA-C  09/19/2017, 12:14 PM

## 2017-09-21 DIAGNOSIS — M25661 Stiffness of right knee, not elsewhere classified: Secondary | ICD-10-CM | POA: Diagnosis not present

## 2017-09-23 DIAGNOSIS — M25661 Stiffness of right knee, not elsewhere classified: Secondary | ICD-10-CM | POA: Diagnosis not present

## 2017-09-26 DIAGNOSIS — M25661 Stiffness of right knee, not elsewhere classified: Secondary | ICD-10-CM | POA: Diagnosis not present

## 2017-09-28 DIAGNOSIS — M25661 Stiffness of right knee, not elsewhere classified: Secondary | ICD-10-CM | POA: Diagnosis not present

## 2017-09-30 DIAGNOSIS — M25561 Pain in right knee: Secondary | ICD-10-CM | POA: Diagnosis not present

## 2017-10-03 DIAGNOSIS — M25661 Stiffness of right knee, not elsewhere classified: Secondary | ICD-10-CM | POA: Diagnosis not present

## 2017-10-05 DIAGNOSIS — M25661 Stiffness of right knee, not elsewhere classified: Secondary | ICD-10-CM | POA: Diagnosis not present

## 2017-10-11 DIAGNOSIS — M25661 Stiffness of right knee, not elsewhere classified: Secondary | ICD-10-CM | POA: Diagnosis not present

## 2017-10-13 DIAGNOSIS — M25661 Stiffness of right knee, not elsewhere classified: Secondary | ICD-10-CM | POA: Diagnosis not present

## 2017-10-18 DIAGNOSIS — M25661 Stiffness of right knee, not elsewhere classified: Secondary | ICD-10-CM | POA: Diagnosis not present

## 2017-10-20 DIAGNOSIS — M25661 Stiffness of right knee, not elsewhere classified: Secondary | ICD-10-CM | POA: Diagnosis not present

## 2017-10-21 ENCOUNTER — Inpatient Hospital Stay (HOSPITAL_BASED_OUTPATIENT_CLINIC_OR_DEPARTMENT_OTHER): Payer: Medicare Other | Admitting: Family

## 2017-10-21 ENCOUNTER — Other Ambulatory Visit: Payer: Self-pay

## 2017-10-21 ENCOUNTER — Inpatient Hospital Stay: Payer: Medicare Other | Attending: Hematology & Oncology

## 2017-10-21 VITALS — BP 130/68 | HR 69 | Temp 98.3°F | Resp 18 | Wt 134.0 lb

## 2017-10-21 DIAGNOSIS — Z79899 Other long term (current) drug therapy: Secondary | ICD-10-CM

## 2017-10-21 DIAGNOSIS — Z853 Personal history of malignant neoplasm of breast: Secondary | ICD-10-CM

## 2017-10-21 DIAGNOSIS — Z7901 Long term (current) use of anticoagulants: Secondary | ICD-10-CM | POA: Diagnosis not present

## 2017-10-21 DIAGNOSIS — Z8673 Personal history of transient ischemic attack (TIA), and cerebral infarction without residual deficits: Secondary | ICD-10-CM | POA: Diagnosis not present

## 2017-10-21 DIAGNOSIS — Z86000 Personal history of in-situ neoplasm of breast: Secondary | ICD-10-CM | POA: Diagnosis not present

## 2017-10-21 DIAGNOSIS — Z9013 Acquired absence of bilateral breasts and nipples: Secondary | ICD-10-CM | POA: Insufficient documentation

## 2017-10-21 LAB — CBC WITH DIFFERENTIAL (CANCER CENTER ONLY)
BASOS ABS: 0 10*3/uL (ref 0.0–0.1)
Basophils Relative: 0 %
Eosinophils Absolute: 0.1 10*3/uL (ref 0.0–0.5)
Eosinophils Relative: 1 %
HEMATOCRIT: 41.2 % (ref 34.8–46.6)
Hemoglobin: 13.1 g/dL (ref 11.6–15.9)
LYMPHS ABS: 1.3 10*3/uL (ref 0.9–3.3)
Lymphocytes Relative: 20 %
MCH: 31.6 pg (ref 26.0–34.0)
MCHC: 31.8 g/dL — AB (ref 32.0–36.0)
MCV: 99.5 fL (ref 81.0–101.0)
MONO ABS: 0.5 10*3/uL (ref 0.1–0.9)
MONOS PCT: 7 %
NEUTROS ABS: 4.9 10*3/uL (ref 1.5–6.5)
Neutrophils Relative %: 72 %
Platelet Count: 249 10*3/uL (ref 145–400)
RBC: 4.14 MIL/uL (ref 3.70–5.32)
RDW: 13.5 % (ref 11.1–15.7)
WBC Count: 6.8 10*3/uL (ref 3.9–10.0)

## 2017-10-21 LAB — CMP (CANCER CENTER ONLY)
ALT: 15 U/L (ref 0–44)
AST: 18 U/L (ref 15–41)
Albumin: 4.3 g/dL (ref 3.5–5.0)
Alkaline Phosphatase: 41 U/L (ref 38–126)
Anion gap: 10 (ref 5–15)
BUN: 13 mg/dL (ref 8–23)
CO2: 25 mmol/L (ref 22–32)
Calcium: 9.4 mg/dL (ref 8.9–10.3)
Chloride: 105 mmol/L (ref 98–111)
Creatinine: 0.85 mg/dL (ref 0.44–1.00)
GFR, Est AFR Am: >60 mL/min (ref >60)
GFR, Estimated: >60 mL/min (ref >60)
Glucose, Bld: 103 mg/dL — ABNORMAL HIGH (ref 70–99)
POTASSIUM: 4.2 mmol/L (ref 3.5–5.1)
Sodium: 140 mmol/L (ref 135–145)
TOTAL PROTEIN: 7 g/dL (ref 6.5–8.1)
Total Bilirubin: 1.6 mg/dL — ABNORMAL HIGH (ref 0.3–1.2)

## 2017-10-21 NOTE — Progress Notes (Signed)
Hematology and Oncology Follow Up Visit  Felicia Hunter [id number] [birth year] 77 y.o. 10/21/2017   Principle Diagnosis:  DCIS of the right breast Cortical stroke of the left MCA-no residual deficit  Current Therapy:   Observation Clinical trial for the CVA   Interim History:  [name] is here today for follow-up. She is doing quite well and has no complaints at this time.  Her chest exam today was negative. Bilateral mastectomies intact, no mass, lesion or rash noted.  She has had no issue with infections. No fever, chills, n/v, cough, rash, dizziness, SOB, chest pain, palpitations, abdominal pain or changes in bowel or bladder habits.  No swelling, tenderness, numbness or tingling in her extremities. No c/o pain. No lymphadenopathy noted on exam.  No episodes of bleeding, no bruising or petechiae.  She has maintained a good appetite and is staying well hydrated. Her weight is stable.   ECOG Performance Status: 0 - Asymptomatic  Medications:  Allergies as of 10/21/2017      Reactions   Sulfonamide Derivatives Rash      Medication List        Accurate as of 10/21/17  3:27 PM. Always use your most recent med list.          alendronate 70 MG tablet Commonly known as:  FOSAMAX Take 70 mg by mouth every Sunday.   atorvastatin 20 MG tablet Commonly known as:  LIPITOR Take 20 mg by mouth every evening.   BIOTIN PO Take 1 tablet by mouth daily.   CALCIUM 600/VITAMIN D3 PO Take 1 tablet by mouth daily.   cholecalciferol 1000 units tablet Commonly known as:  VITAMIN D Take 1,000 Units by mouth daily.   FISH OIL PO Take 1 capsule by mouth 2 (two) times daily.   multivitamin capsule Take 1 capsule by mouth daily.   polyvinyl alcohol 1.4 % ophthalmic solution Commonly known as:  LIQUIFILM TEARS Place 1 drop into both eyes as needed for dry eyes.   vitamin C 500 MG tablet Commonly known as:  ASCORBIC ACID Take 500 mg by mouth daily as needed (for cold symptoms).     XARELTO 20 MG Tabs tablet Generic drug:  rivaroxaban TAKE 1 TABLET ONCE DAILY WITH SUPPER.   zolpidem 10 MG tablet Commonly known as:  AMBIEN Take 10 mg by mouth at bedtime as needed for sleep.       Allergies:  Allergies  Allergen Reactions  . Sulfonamide Derivatives Rash    Past Medical History, Surgical history, Social history, and Family History were reviewed and updated.  Review of Systems: All other 10 point review of systems is negative.   Physical Exam:  weight is 134 lb (60.8 kg). Her oral temperature is 98.3 F (36.8 C). Her blood pressure is 130/68 and her pulse is 69. Her respiration is 18 and oxygen saturation is 98%.   Wt Readings from Last 3 Encounters:  10/21/17 134 lb (60.8 kg)  09/13/17 134 lb 6 oz (61 kg)  05/16/17 140 lb (63.5 kg)    Ocular: Sclerae unicteric, pupils equal, round and reactive to light Ear-nose-throat: Oropharynx clear, dentition fair Lymphatic: No cervical, supraclavicular or axillary adenopathy Lungs no rales or rhonchi, good excursion bilaterally Heart regular rate and rhythm, no murmur appreciated Abd soft, nontender, positive bowel sounds, no liver or spleen tip palpated on exam, no fluid wave  MSK no focal spinal tenderness, no joint edema Neuro: non-focal, well-oriented, appropriate affect Breasts: No change. Bilater mastectomies intact, no mass,  lesion or rash noted.   Lab Results  Component Value Date   WBC 6.8 10/21/2017   HGB 13.1 10/21/2017   HCT 41.2 10/21/2017   MCV 99.5 10/21/2017   PLT 249 10/21/2017   No results found for: FERRITIN, IRON, TIBC, UIBC, IRONPCTSAT Lab Results  Component Value Date   RBC 4.14 10/21/2017   No results found for: KPAFRELGTCHN, LAMBDASER, KAPLAMBRATIO No results found for: IGGSERUM, IGA, IGMSERUM No results found for: Kathrynn Ducking, MSPIKE, SPEI   Chemistry      Component Value Date/Time   NA 130 (L) 09/14/2017 0524   NA 139  10/21/2016 1335   K 3.7 09/14/2017 0524   K 4.1 10/21/2016 1335   CL 100 09/14/2017 0524   CL 106 10/21/2016 1335   CO2 21 (L) 09/14/2017 0524   CO2 26 10/21/2016 1335   BUN 9 09/14/2017 0524   BUN 16 10/21/2016 1335   CREATININE 0.49 09/14/2017 0524   CREATININE 0.9 10/21/2016 1335      Component Value Date/Time   CALCIUM 7.8 (L) 09/14/2017 0524   CALCIUM 9.4 10/21/2016 1335   ALKPHOS 40 10/21/2016 1335   AST 24 10/21/2016 1335   ALT 24 10/21/2016 1335   BILITOT 2.00 (H) 10/21/2016 1335      Impression and Plan: [name] is a very pleasant 77 yo caucasian female with history of ductal carcinoma in situ of the right breast diagnosed in 2011 with bilateral mastectomies. She continues to do well and has no complaints at this time.  We will plan to see her back in another year for follow-up.  She will contact our office with any questions or concerns. We can certainly see her sooner if need be.   [name], NP 8/9/20193:27 PM

## 2017-10-26 DIAGNOSIS — Z471 Aftercare following joint replacement surgery: Secondary | ICD-10-CM | POA: Diagnosis not present

## 2017-10-26 DIAGNOSIS — Z96651 Presence of right artificial knee joint: Secondary | ICD-10-CM | POA: Diagnosis not present

## 2017-12-24 ENCOUNTER — Other Ambulatory Visit: Payer: Self-pay | Admitting: Internal Medicine

## 2017-12-26 NOTE — Telephone Encounter (Signed)
Pt last saw Dr Lovena Le 04/26/17, last labs 10/21/17 Creat 0.85, age 77, weight 60.8kg, CrCl 53.2, based on CrCl pt is on appropriate dosage of Xarelto 20mg  QD.  Will refill rx.

## 2017-12-30 DIAGNOSIS — Z23 Encounter for immunization: Secondary | ICD-10-CM | POA: Diagnosis not present

## 2018-01-09 DIAGNOSIS — H35363 Drusen (degenerative) of macula, bilateral: Secondary | ICD-10-CM | POA: Diagnosis not present

## 2018-01-09 DIAGNOSIS — H43813 Vitreous degeneration, bilateral: Secondary | ICD-10-CM | POA: Diagnosis not present

## 2018-01-09 DIAGNOSIS — H5213 Myopia, bilateral: Secondary | ICD-10-CM | POA: Diagnosis not present

## 2018-01-09 DIAGNOSIS — H25813 Combined forms of age-related cataract, bilateral: Secondary | ICD-10-CM | POA: Diagnosis not present

## 2018-02-24 ENCOUNTER — Emergency Department (HOSPITAL_COMMUNITY): Payer: Medicare Other

## 2018-02-24 ENCOUNTER — Emergency Department (HOSPITAL_COMMUNITY)
Admission: EM | Admit: 2018-02-24 | Discharge: 2018-02-24 | Disposition: A | Discharge status: Home / Self Care | Payer: Medicare Other | Attending: Emergency Medicine | Admitting: Emergency Medicine

## 2018-02-24 ENCOUNTER — Encounter (HOSPITAL_COMMUNITY): Payer: Self-pay | Admitting: Emergency Medicine

## 2018-02-24 DIAGNOSIS — Y929 Unspecified place or not applicable: Secondary | ICD-10-CM | POA: Insufficient documentation

## 2018-02-24 DIAGNOSIS — I4891 Unspecified atrial fibrillation: Secondary | ICD-10-CM | POA: Insufficient documentation

## 2018-02-24 DIAGNOSIS — W1830XA Fall on same level, unspecified, initial encounter: Secondary | ICD-10-CM | POA: Insufficient documentation

## 2018-02-24 DIAGNOSIS — S199XXA Unspecified injury of neck, initial encounter: Secondary | ICD-10-CM | POA: Diagnosis not present

## 2018-02-24 DIAGNOSIS — R51 Headache: Secondary | ICD-10-CM | POA: Insufficient documentation

## 2018-02-24 DIAGNOSIS — M542 Cervicalgia: Secondary | ICD-10-CM | POA: Insufficient documentation

## 2018-02-24 DIAGNOSIS — I63542 Cerebral infarction due to unspecified occlusion or stenosis of left cerebellar artery: Secondary | ICD-10-CM | POA: Diagnosis not present

## 2018-02-24 DIAGNOSIS — Y999 Unspecified external cause status: Secondary | ICD-10-CM | POA: Diagnosis not present

## 2018-02-24 DIAGNOSIS — Y939 Activity, unspecified: Secondary | ICD-10-CM | POA: Insufficient documentation

## 2018-02-24 DIAGNOSIS — Z7901 Long term (current) use of anticoagulants: Secondary | ICD-10-CM | POA: Diagnosis not present

## 2018-02-24 DIAGNOSIS — Z853 Personal history of malignant neoplasm of breast: Secondary | ICD-10-CM | POA: Insufficient documentation

## 2018-02-24 DIAGNOSIS — S0083XA Contusion of other part of head, initial encounter: Secondary | ICD-10-CM | POA: Diagnosis not present

## 2018-02-24 DIAGNOSIS — W19XXXA Unspecified fall, initial encounter: Secondary | ICD-10-CM

## 2018-02-24 DIAGNOSIS — Z79899 Other long term (current) drug therapy: Secondary | ICD-10-CM | POA: Insufficient documentation

## 2018-02-24 NOTE — ED Notes (Signed)
Pt transported to CT ?

## 2018-02-24 NOTE — ED Triage Notes (Signed)
Pt here after a trip and fall hitting the left side of head , no loc , pt is on blood thinners

## 2018-02-24 NOTE — Discharge Instructions (Addendum)
X-rays showed no broken bones.  You will be sore for several days.  Ice pack.  Tylenol for pain.

## 2018-02-27 NOTE — ED Provider Notes (Signed)
[facility] EMERGENCY DEPARTMENT Provider Note   CSN: [id number] Arrival date & time: 02/24/18  1100     History   Chief Complaint Chief Complaint  Patient presents with  . Fall    HPI Felicia Hunter is a 77 y.o. female.  Accidental mechanical fall just prior to emergency visit resulting in hematoma to left forehead and neck pain.  No loss of consciousness or neurological deficits.  Patient is on Xarelto.  Severity of pain is mild to moderate.  Daughter reports normal behavior.     Past Medical History:  Diagnosis Date  . Alopecia   . Arthritis   . Atrial fibrillation ([place])   . Breast cancer ([place])    double mastectomy 2011  . Clavicle fracture    right clavicle-hardware retained  . Clotting disorder ([place])    with CVA- 2013, 2014  . Cough    PERSISTANT COUGH X 20 YRS  . Difficulty sleeping    takes Ambien  . Disorders of bilirubin excretion   . Diverticulosis   . Environmental and seasonal allergies    "dry cough", related to ? allergies  . GERD (gastroesophageal reflux disease)   . Hyperlipidemia   . MVP (mitral valve prolapse)   . Osteopenia   . Osteoporosis   . Stroke ([place]) 06/07/11   x2 episodes-"funny feeling"" equilibrium off x24 hours, x1 speech affected- no residuallast 1 yr ago.  . Tubulovillous adenoma of colon 08/2003    Patient Active Problem List   Diagnosis Date Noted  . S/P right TKA 09/13/2017  . S/P total knee replacement 09/13/2017  . Right leg pain 03/01/2016  . S/P left THA, AA 12/31/2014  . Cerebral infarction due to embolism of precerebral artery ([place]) 12/20/2013  . History of stroke 12/20/2013  . Expressive aphasia 11/05/2013  . Expected blood loss anemia 04/11/2013  . Hyponatremia 04/11/2013  . Other and unspecified hyperlipidemia 06/15/2012  . Lacunar infarction ([place]) 06/15/2012  . CVA (cerebral infarction)-mid/inf cerebellar infarct 3/25 06/08/2011  . Near syncope 06/07/2011  . breast cancer, right DCIS s/p  R mastectomy 08/20/09 02/24/2011  . DEPRESSION 10/28/2006  . ALLERGIC RHINITIS 10/28/2006  . ASTHMA 10/28/2006    Past Surgical History:  Procedure Laterality Date  . ABDOMINAL HYSTERECTOMY  1993  . BREAST CAPSULOTOMY     after bilat mastectomies-implants removed  . BREAST ENHANCEMENT SURGERY     x3-then removed 2011 for bilat mastectomies  . BREAST SURGERY  june 2011   double mastectomy nodes from rt  . CHEST WALL TUMOR EXCISION  5/12   noduals removed-neg  . collarbone  september 2011   pinned and plated, right  . COLONOSCOPY    . JOINT REPLACEMENT  july 9th, 2012   left knee replacement , Right knee replacement 09-13-17 Dr. Alvan Dame  . KNEE RECONSTRUCTION  09/2010   revision lt total knee   left  . LOOP RECORDER IMPLANT N/A 11/07/2013   Procedure: LOOP RECORDER IMPLANT;  Surgeon: Evans Lance, MD;  Location: [facility] CATH LAB;  Service: Cardiovascular;  Laterality: N/A;  . LOOP RECORDER REMOVAL N/A 05/16/2017   Procedure: LOOP RECORDER REMOVAL;  Surgeon: Evans Lance, MD;  Location: [place] CV LAB;  Service: Cardiovascular;  Laterality: N/A;  . MASS EXCISION Left 06/19/2012   Procedure: EXCISION OF LARGE MASS LEFT NECK/SHOULDER WITH PLASTIC CLOSURE;  Surgeon: Cristine Polio, MD;  Location: [facility];  Service: Plastics;  Laterality: Left;  . NASAL SEPTUM SURGERY    .  TEE WITHOUT CARDIOVERSION N/A 11/07/2013   Procedure: TRANSESOPHAGEAL ECHOCARDIOGRAM (TEE);  Surgeon: Dorothy Spark, MD;  Location: [facility];  Service: Cardiovascular;  Laterality: N/A;  . TONSILLECTOMY    . TOTAL HIP ARTHROPLASTY Right 04/10/2013   Procedure: RIGHT TOTAL HIP ARTHROPLASTY ANTERIOR APPROACH;  Surgeon: [name], MD;  Location: [facility];  Service: Orthopedics;  Laterality: Right;  . TOTAL HIP ARTHROPLASTY Left 12/31/2014   Procedure: LEFT TOTAL HIP ARTHROPLASTY ANTERIOR APPROACH;  Surgeon: Paralee Cancel, MD;  Location: [facility];  Service: Orthopedics;  Laterality: Left;  . TOTAL KNEE  ARTHROPLASTY Right 09/13/2017   Procedure: RIGHT TOTAL KNEE ARTHROPLASTY;  Surgeon: Paralee Cancel, MD;  Location: [facility];  Service: Orthopedics;  Laterality: Right;  70 mins  . UPPER GASTROINTESTINAL ENDOSCOPY    . VEIN SURGERY  1974   left leg     OB History   No obstetric history on file.      Home Medications    Prior to Admission medications   Medication Sig Start Date End Date Taking? Authorizing Provider  acetaminophen (TYLENOL) 500 MG tablet Take 1,000 mg by mouth as needed for mild pain.   Yes [provider]  alendronate (FOSAMAX) 70 MG tablet Take 70 mg by mouth every Sunday.  11/24/15  Yes [provider]  atorvastatin (LIPITOR) 20 MG tablet Take 20 mg by mouth every evening.  06/07/12  Yes [provider]  BIOTIN PO Take 1 tablet by mouth daily.   Yes [provider]  Calcium Carb-Cholecalciferol (CALCIUM 600/VITAMIN D3 PO) Take 1 tablet by mouth daily.   Yes [provider]  cholecalciferol (VITAMIN D) 1000 UNITS tablet Take 1,000 Units by mouth daily.   Yes [provider]  Multiple Vitamin (MULTIVITAMIN) capsule Take 1 capsule by mouth daily.   Yes [provider]  Omega-3 Fatty Acids (FISH OIL PO) Take 1 capsule by mouth 2 (two) times daily.   Yes [provider]  polyvinyl alcohol (LIQUIFILM TEARS) 1.4 % ophthalmic solution Place 1 drop into both eyes as needed for dry eyes.    Yes [provider]  vitamin C (ASCORBIC ACID) 500 MG tablet Take 500 mg by mouth daily as needed (for cold symptoms).    Yes [provider]  XARELTO 20 MG TABS tablet TAKE 1 TABLET ONCE DAILY WITH SUPPER. Patient taking differently: Take 20 mg by mouth daily with supper.  12/26/17  Yes Evans Lance, MD  zolpidem (AMBIEN) 10 MG tablet Take 10 mg by mouth at bedtime as needed for sleep.  01/05/16  Yes [provider]    Family History Family History  Problem Relation Age of Onset  . Aneurysm  Father   . Colon cancer Neg Hx   . Esophageal cancer Neg Hx   . Rectal cancer Neg Hx   . Stomach cancer Neg Hx     Social History Social History   Tobacco Use  . Smoking status: Never Smoker  . Smokeless tobacco: Never Used  Substance Use Topics  . Alcohol use: Yes    Alcohol/week: 1.0 standard drinks    Types: 1 Glasses of wine per week    Comment: 1 glass of wine per night  . Drug use: No     Allergies   Sulfonamide derivatives   Review of Systems Review of Systems  All other systems reviewed and are negative.    Physical Exam Updated Vital Signs BP (!) 163/91 (BP Location: Right Arm)   Pulse  63   Temp 97.7 F (36.5 C) (Oral)   Resp 16   SpO2 100%   Physical Exam Vitals signs and nursing note reviewed.  Constitutional:      Appearance: She is well-developed.  HENT:     Head: Normocephalic.     Comments: 2.5 cm hematoma left forehead Eyes:     Conjunctiva/sclera: Conjunctivae normal.  Neck:     Musculoskeletal: Neck supple.     Comments: Minimal posterior cervical tenderness. Cardiovascular:     Rate and Rhythm: Normal rate and regular rhythm.  Pulmonary:     Effort: Pulmonary effort is normal.     Breath sounds: Normal breath sounds.  Abdominal:     General: Bowel sounds are normal.     Palpations: Abdomen is soft.  Musculoskeletal: Normal range of motion.  Skin:    General: Skin is warm and dry.  Neurological:     Mental Status: She is alert and oriented to person, place, and time.  Psychiatric:        Behavior: Behavior normal.      ED Treatments / Results  Labs (all labs ordered are listed, but only abnormal results are displayed) Labs Reviewed - No data to display  EKG None  Radiology No results found.  Procedures Procedures (including critical care time)  Medications Ordered in ED Medications - No data to display   Initial Impression / Assessment and Plan / ED Course  I have reviewed the triage vital signs and the  nursing notes.  Pertinent labs & imaging results that were available during my care of the patient were reviewed by me and considered in my medical decision making (see chart for details).     Patient presents with accidental fall striking her left forehead.  CT head and CT cervical spine negative for acute findings..  Patient is alert and oriented at discharge and ambulatory.  Final Clinical Impressions(s) / ED Diagnoses   Final diagnoses:  Fall, initial encounter  Traumatic hematoma of forehead, initial encounter  Neck pain    ED Discharge Orders    None       Nat Christen, MD 02/27/18 1435

## 2018-03-06 DIAGNOSIS — E7800 Pure hypercholesterolemia, unspecified: Secondary | ICD-10-CM | POA: Diagnosis not present

## 2018-03-09 DIAGNOSIS — Z8673 Personal history of transient ischemic attack (TIA), and cerebral infarction without residual deficits: Secondary | ICD-10-CM | POA: Diagnosis not present

## 2018-03-09 DIAGNOSIS — E7800 Pure hypercholesterolemia, unspecified: Secondary | ICD-10-CM | POA: Diagnosis not present

## 2018-03-09 DIAGNOSIS — M159 Polyosteoarthritis, unspecified: Secondary | ICD-10-CM | POA: Diagnosis not present

## 2018-03-09 DIAGNOSIS — M810 Age-related osteoporosis without current pathological fracture: Secondary | ICD-10-CM | POA: Diagnosis not present

## 2018-03-09 DIAGNOSIS — I8390 Asymptomatic varicose veins of unspecified lower extremity: Secondary | ICD-10-CM | POA: Diagnosis not present

## 2018-03-09 DIAGNOSIS — R05 Cough: Secondary | ICD-10-CM | POA: Diagnosis not present

## 2018-03-09 DIAGNOSIS — K219 Gastro-esophageal reflux disease without esophagitis: Secondary | ICD-10-CM | POA: Diagnosis not present

## 2018-03-09 DIAGNOSIS — Z23 Encounter for immunization: Secondary | ICD-10-CM | POA: Diagnosis not present

## 2018-03-09 DIAGNOSIS — Z0000 Encounter for general adult medical examination without abnormal findings: Secondary | ICD-10-CM | POA: Diagnosis not present

## 2018-03-10 ENCOUNTER — Other Ambulatory Visit: Payer: Self-pay | Admitting: Internal Medicine

## 2018-03-13 ENCOUNTER — Other Ambulatory Visit: Payer: Self-pay | Admitting: Internal Medicine

## 2018-03-13 DIAGNOSIS — R918 Other nonspecific abnormal finding of lung field: Secondary | ICD-10-CM

## 2018-03-23 ENCOUNTER — Other Ambulatory Visit: Payer: Medicare Other

## 2018-03-27 ENCOUNTER — Ambulatory Visit
Admission: RE | Admit: 2018-03-27 | Discharge: 2018-03-27 | Disposition: A | Discharge status: Home / Self Care | Payer: Medicare Other | Source: Ambulatory Visit | Attending: Internal Medicine | Admitting: Internal Medicine

## 2018-03-27 DIAGNOSIS — R918 Other nonspecific abnormal finding of lung field: Secondary | ICD-10-CM | POA: Diagnosis not present

## 2018-03-27 MED ORDER — IOPAMIDOL (ISOVUE-300) INJECTION 61%
75.0000 mL | Freq: Once | INTRAVENOUS | Status: AC | PRN
  Administered 2018-03-27: 75 mL via INTRAVENOUS

## 2018-03-30 ENCOUNTER — Ambulatory Visit (INDEPENDENT_AMBULATORY_CARE_PROVIDER_SITE_OTHER): Payer: Medicare Other | Admitting: Pulmonary Disease

## 2018-03-30 ENCOUNTER — Institutional Professional Consult (permissible substitution): Payer: Medicare Other | Admitting: Pulmonary Disease

## 2018-03-30 ENCOUNTER — Encounter: Payer: Self-pay | Admitting: Pulmonary Disease

## 2018-03-30 DIAGNOSIS — R911 Solitary pulmonary nodule: Secondary | ICD-10-CM | POA: Diagnosis not present

## 2018-03-30 NOTE — Patient Instructions (Signed)
We discussed various options including wait and watch and bronchoscopy.  Suggest-take Mucinex 600 mg twice daily for 2 weeks. Use flutter valve daily  Schedule PET scan in 4 weeks

## 2018-03-30 NOTE — Assessment & Plan Note (Addendum)
Right lower lobe lobulated nodule, that was not present on last chest x-ray 10/2016 Differential diagnosis includes endobronchial mass or inspissated mucus  We discussed various options including wait and watch and bronchoscopy.  Suggest-take Mucinex 600 mg twice daily for 2 weeks. Use flutter valve daily  Schedule PET scan in 4 weeks  Greater than 50% time x 40 mins was spent in counseling and coordination of care with the patient

## 2018-03-30 NOTE — Progress Notes (Signed)
Subjective:    Patient ID: Felicia Hunter, female    DOB: April 20, 1940, 78 y.o.   MRN: [id number]  HPI Chief Complaint  Patient presents with  . Pulm Consult    Referred by PCP Dr. Deland Pretty for abnormal CT scan. Denies any breathing issues.     78 year old never smoker referred for evaluation of abnormal CT scan showing right lower lobe lung nodule. She reports a chronic cough for more than 20 years for which she has had a detailed evaluation including consultation at [facility] and was told that this may be a combination of postnasal drip and reflux.  She was told at one point that this may be cough variant asthma.  At any rate medications have not helped and she has just learned to live with this cough.  She denies any seasonal variation or diurnal variation.  Cough generally does not wake her up at night.  She presented to PCP in her usual state of health and chest x-ray was obtained which showed right lower lobe nodule CT chest without contrast was obtained and 03/27/2018 and I reviewed these images with the patient and her daughter.  This showed 22 x 18 mm inferior right middle lobe lobulated nodule with a tiny airway leading to it suggestive of mucoid impaction versus endobronchial lesion and postobstructive consolidation.  Borderline right axillary adenopathy was noted as also external breast prosthesis.  She reports a history of breast cancer in 2011 status post bilateral mastectomy and prosthesis. She is maintained on anticoagulation for TIAs which were felt to be embolic after placement of loop recorder. I was able to review a chest x-ray from 10/2016 which does not show similar nodule no prior CT scan is available  She denies wheezing, hemoptysis, weight loss or loss of appetite There is no history of fevers or recent acute illness, she denies productive sputum   Past Medical History:  Diagnosis Date  . Alopecia   . Arthritis   . Atrial fibrillation ([place])   . Breast cancer ([place])      double mastectomy 2011  . Clavicle fracture    right clavicle-hardware retained  . Clotting disorder ([place])    with CVA- 2013, 2014  . Cough    PERSISTANT COUGH X 20 YRS  . Difficulty sleeping    takes Ambien  . Disorders of bilirubin excretion   . Diverticulosis   . Environmental and seasonal allergies    "dry cough", related to ? allergies  . GERD (gastroesophageal reflux disease)   . Hyperlipidemia   . MVP (mitral valve prolapse)   . Osteopenia   . Osteoporosis   . Stroke ([place]) 06/07/11   x2 episodes-"funny feeling"" equilibrium off x24 hours, x1 speech affected- no residuallast 1 yr ago.  . Tubulovillous adenoma of colon 08/2003   Past Surgical History:  Procedure Laterality Date  . ABDOMINAL HYSTERECTOMY  1993  . BREAST CAPSULOTOMY     after bilat mastectomies-implants removed  . BREAST ENHANCEMENT SURGERY     x3-then removed 2011 for bilat mastectomies  . BREAST SURGERY  june 2011   double mastectomy nodes from rt  . CHEST WALL TUMOR EXCISION  5/12   noduals removed-neg  . collarbone  september 2011   pinned and plated, right  . COLONOSCOPY    . JOINT REPLACEMENT  july 9th, 2012   left knee replacement , Right knee replacement 09-13-17 Dr. Alvan Dame  . KNEE RECONSTRUCTION  09/2010   revision lt total knee   left  .  LOOP RECORDER IMPLANT N/A 11/07/2013   Procedure: LOOP RECORDER IMPLANT;  Surgeon: Evans Lance, MD;  Location: [facility] CATH LAB;  Service: Cardiovascular;  Laterality: N/A;  . LOOP RECORDER REMOVAL N/A 05/16/2017   Procedure: LOOP RECORDER REMOVAL;  Surgeon: Evans Lance, MD;  Location: [place] CV LAB;  Service: Cardiovascular;  Laterality: N/A;  . MASS EXCISION Left 06/19/2012   Procedure: EXCISION OF LARGE MASS LEFT NECK/SHOULDER WITH PLASTIC CLOSURE;  Surgeon: Cristine Polio, MD;  Location: [place];  Service: Plastics;  Laterality: Left;  . NASAL SEPTUM SURGERY    . TEE WITHOUT CARDIOVERSION N/A 11/07/2013   Procedure: TRANSESOPHAGEAL  ECHOCARDIOGRAM (TEE);  Surgeon: Dorothy Spark, MD;  Location: [place];  Service: Cardiovascular;  Laterality: N/A;  . TONSILLECTOMY    . TOTAL HIP ARTHROPLASTY Right 04/10/2013   Procedure: RIGHT TOTAL HIP ARTHROPLASTY ANTERIOR APPROACH;  Surgeon: [name], MD;  Location: [facility];  Service: Orthopedics;  Laterality: Right;  . TOTAL HIP ARTHROPLASTY Left 12/31/2014   Procedure: LEFT TOTAL HIP ARTHROPLASTY ANTERIOR APPROACH;  Surgeon: Paralee Cancel, MD;  Location: [facility];  Service: Orthopedics;  Laterality: Left;  . TOTAL KNEE ARTHROPLASTY Right 09/13/2017   Procedure: RIGHT TOTAL KNEE ARTHROPLASTY;  Surgeon: Paralee Cancel, MD;  Location: [facility];  Service: Orthopedics;  Laterality: Right;  70 mins  . UPPER GASTROINTESTINAL ENDOSCOPY    . VEIN SURGERY  1974   left leg    Allergies  Allergen Reactions  . Sulfonamide Derivatives Rash    Social History   Socioeconomic History  . Marital status: Widowed    Spouse name: Not on file  . Number of children: 2  . Years of education: college  . Highest education level: Not on file  Occupational History  . Occupation: retired    Fish farm manager: Retired    Fish farm manager: RETIRED  Social Needs  . Financial resource strain: Not on file  . Food insecurity:    Worry: Not on file    Inability: Not on file  . Transportation needs:    Medical: Not on file    Non-medical: Not on file  Tobacco Use  . Smoking status: Never Smoker  . Smokeless tobacco: Never Used  Substance and Sexual Activity  . Alcohol use: Yes    Alcohol/week: 1.0 standard drinks    Types: 1 Glasses of wine per week    Comment: 1 glass of wine per night  . Drug use: No  . Sexual activity: Not Currently  Lifestyle  . Physical activity:    Days per week: Not on file    Minutes per session: Not on file  . Stress: Not on file  Relationships  . Social connections:    Talks on phone: Not on file    Gets together: Not on file    Attends religious service: Not on file     Active member of club or organization: Not on file    Attends meetings of clubs or organizations: Not on file    Relationship status: Not on file  . Intimate partner violence:    Fear of current or ex partner: Not on file    Emotionally abused: Not on file    Physically abused: Not on file    Forced sexual activity: Not on file  Other Topics Concern  . Not on file  Social History Narrative   Patient is single with 2 children.    Patient is left handed.   Patient has college education.  Patient drinks coffee daily     Family History  Problem Relation Age of Onset  . Aneurysm Father   . Colon cancer Neg Hx   . Esophageal cancer Neg Hx   . Rectal cancer Neg Hx   . Stomach cancer Neg Hx       Review of Systems Constitutional: negative for anorexia, fevers and sweats  Eyes: negative for irritation, redness and visual disturbance  Ears, nose, mouth, throat, and face: negative for earaches, epistaxis, nasal congestion and sore throat  Respiratory: negative for  dyspnea on exertion, sputum and wheezing  Cardiovascular: negative for chest pain, dyspnea, lower extremity edema, orthopnea, palpitations and syncope  Gastrointestinal: negative for abdominal pain, constipation, diarrhea, melena, nausea and vomiting  Genitourinary:negative for dysuria, frequency and hematuria  Hematologic/lymphatic: negative for bleeding, easy bruising and lymphadenopathy  Musculoskeletal:negative for arthralgias, muscle weakness and stiff joints  Neurological: negative for coordination problems, gait problems, headaches and weakness  Endocrine: negative for diabetic symptoms including polydipsia, polyuria and weight loss     Objective:   Physical Exam   Gen. Pleasant, well-nourished, elderly,in no distress, normal affect ENT - no pallor,icterus, no post nasal drip Neck: No JVD, no thyromegaly, no carotid bruits Lungs: no use of accessory muscles, no dullness to percussion, clear without rales or  rhonchi  Cardiovascular: Rhythm regular, heart sounds  normal, no murmurs or gallops, no peripheral edema Abdomen: soft and non-tender, no hepatosplenomegaly, BS normal. Musculoskeletal: No deformities, no cyanosis or clubbing Neuro:  alert, non focal        Assessment & Plan:

## 2018-04-18 DIAGNOSIS — E7800 Pure hypercholesterolemia, unspecified: Secondary | ICD-10-CM | POA: Diagnosis not present

## 2018-04-18 DIAGNOSIS — Z1212 Encounter for screening for malignant neoplasm of rectum: Secondary | ICD-10-CM | POA: Diagnosis not present

## 2018-04-18 DIAGNOSIS — M810 Age-related osteoporosis without current pathological fracture: Secondary | ICD-10-CM | POA: Diagnosis not present

## 2018-04-18 DIAGNOSIS — Z01419 Encounter for gynecological examination (general) (routine) without abnormal findings: Secondary | ICD-10-CM | POA: Diagnosis not present

## 2018-04-27 ENCOUNTER — Ambulatory Visit (HOSPITAL_COMMUNITY)
Admission: RE | Admit: 2018-04-27 | Discharge: 2018-04-27 | Disposition: A | Discharge status: Home / Self Care | Payer: Medicare Other | Source: Ambulatory Visit | Attending: Pulmonary Disease | Admitting: Pulmonary Disease

## 2018-04-27 DIAGNOSIS — R911 Solitary pulmonary nodule: Secondary | ICD-10-CM | POA: Insufficient documentation

## 2018-04-27 LAB — GLUCOSE, CAPILLARY: GLUCOSE-CAPILLARY: 94 mg/dL (ref 70–99)

## 2018-04-27 MED ORDER — FLUDEOXYGLUCOSE F - 18 (FDG) INJECTION
6.9000 | Freq: Once | INTRAVENOUS | Status: AC
  Administered 2018-04-27: 6.9 via INTRAVENOUS

## 2018-04-28 ENCOUNTER — Telehealth: Payer: Self-pay | Admitting: Pulmonary Disease

## 2018-04-28 NOTE — Telephone Encounter (Signed)
Notes recorded by Madolyn Frieze, LPN on 2/48/1859 at 0:93 PM EST Left message for patient will call back. ------  Notes recorded by Rigoberto Noel, MD on 04/28/2018 at 1:41 PM EST Nodule appears smaller and only low-level uptake indicating benign cause. We will discuss more at follow-up visit  Called and spoke with pt letting her know the results of PET and that RA will further discuss scan at next OV. Pt expressed understanding.nothing further needed.

## 2018-05-04 ENCOUNTER — Ambulatory Visit (INDEPENDENT_AMBULATORY_CARE_PROVIDER_SITE_OTHER): Payer: Medicare Other | Admitting: Pulmonary Disease

## 2018-05-04 ENCOUNTER — Inpatient Hospital Stay: Admission: RE | Admit: 2018-05-04 | Payer: Medicare Other | Source: Ambulatory Visit

## 2018-05-04 ENCOUNTER — Encounter: Payer: Self-pay | Admitting: Pulmonary Disease

## 2018-05-04 VITALS — BP 130/86 | HR 60 | Ht 65.0 in | Wt 144.2 lb

## 2018-05-04 DIAGNOSIS — R911 Solitary pulmonary nodule: Secondary | ICD-10-CM

## 2018-05-04 NOTE — Assessment & Plan Note (Signed)
Appears to be mucoid impaction due to decrease in size and lobulation. She will decrease Mucinex to once daily and then stop. We will follow-up with chest x-ray in 3 months I doubt that repeat CT imaging necessary here She may have some mild bibasilar bronchiectasis

## 2018-05-04 NOTE — Patient Instructions (Signed)
CXR next visit Decrease mucinex to once daily x 2 weeks then STOP

## 2018-05-04 NOTE — Progress Notes (Signed)
   Subjective:    Patient ID: Felicia Hunter, female    DOB: 06/26/1940, 78 y.o.   MRN: [id number]  HPI  78 year old never smoker for FU of  right lower lobe lung nodule. She has a chronic cough for more than 20 years for which she had a detailed evaluation  at [facility] and was told that this may be a combination of postnasal drip and reflux.  She was told at one point that this may be cough variant asthma.  Medications have not helped and she has just learned to live with this cough.    PMH - breast cancer in 2011 status post bilateral mastectomy and prosthesis.  TIAs ? embolic s/p loop recorder, on xarelto  Chest x-ray showed right lower lobe nodule, clarified on CT to be lobulated suggesting mucoid impaction.  We treated her with Mucinex  We repeated PET scan 04/27/2018 which showed decrease in size and lobulation and low-grade hypermetabolism suggesting benign cause  No new symptoms since last visit   Significant tests/ events reviewed  CT chest without contrast 03/27/2018 >> showed 22 x 18 mm inferior right middle lobe lobulated nodule with a tiny airway leading to it suggestive of mucoid impaction versus endobronchial lesion and postobstructive consolidation.  Borderline right axillary adenopathy was noted as also external breast prosthesis. Review of Systems Patient denies significant dyspnea,cough, hemoptysis,  chest pain, palpitations, pedal edema, orthopnea, paroxysmal nocturnal dyspnea, lightheadedness, nausea, vomiting, abdominal or  leg pains      Objective:   Physical Exam  Gen. Pleasant, well-nourished, in no distress ENT - no thrush, no pallor/icterus,no post nasal drip Neck: No JVD, no thyromegaly, no carotid bruits Lungs: no use of accessory muscles, no dullness to percussion, clear without rales or rhonchi  Cardiovascular: Rhythm regular, heart sounds  normal, no murmurs or gallops, no peripheral edema Musculoskeletal: No deformities, no cyanosis or clubbing          Assessment & Plan:

## 2018-06-08 DIAGNOSIS — R300 Dysuria: Secondary | ICD-10-CM | POA: Diagnosis not present

## 2018-06-08 DIAGNOSIS — N390 Urinary tract infection, site not specified: Secondary | ICD-10-CM | POA: Diagnosis not present

## 2018-06-08 DIAGNOSIS — R358 Other polyuria: Secondary | ICD-10-CM | POA: Diagnosis not present

## 2018-06-20 ENCOUNTER — Encounter: Payer: Self-pay | Admitting: Adult Health

## 2018-06-26 ENCOUNTER — Telehealth: Payer: Self-pay | Admitting: Adult Health

## 2018-06-26 NOTE — Telephone Encounter (Signed)
error 

## 2018-06-27 ENCOUNTER — Other Ambulatory Visit: Payer: Self-pay | Admitting: Internal Medicine

## 2018-06-27 NOTE — Telephone Encounter (Signed)
Pt last saw GT 04/26/17, no follow-up appt scheduled.  Last labs 10/21/17 Creat 0.85, age 78, weight 65.4kg, CrCl 56.32, based on CrCl pt is on appropriate dosage of Xarelto 20mg  QD.  Pt needs to schedule f/u appt with Dr Lovena Le and will refill rx to get pt to appt.  Message sent to Lorenda Hatchet to please call pt and schedule f/u appt. Once schedu;led will refill rx.

## 2018-06-28 NOTE — Telephone Encounter (Signed)
Will refill while wait on appt due to [place].

## 2018-07-13 ENCOUNTER — Telehealth (INDEPENDENT_AMBULATORY_CARE_PROVIDER_SITE_OTHER): Payer: Medicare Other | Admitting: Internal Medicine

## 2018-07-13 ENCOUNTER — Other Ambulatory Visit: Payer: Self-pay

## 2018-07-13 DIAGNOSIS — I6310 Cerebral infarction due to embolism of unspecified precerebral artery: Secondary | ICD-10-CM | POA: Diagnosis not present

## 2018-07-13 DIAGNOSIS — I480 Paroxysmal atrial fibrillation: Secondary | ICD-10-CM | POA: Diagnosis not present

## 2018-07-13 NOTE — Progress Notes (Signed)
Electrophysiology TeleHealth Note   Due to national recommendations of social distancing due to COVID 19, an audio/video telehealth visit is felt to be most appropriate for this patient at this time.  See MyChart message from today for the patient's consent to telehealth for [facility].   Date:  07/13/2018   ID:  Felicia Hunter, DOB July 06, 1940, MRN [id number]  Location: patient's home  Provider location: [address], [place]  Evaluation Performed: Follow-up visit  PCP:  Deland Pretty, MD  Cardiologist:  Cristopher Peru, MD  Electrophysiologist:  Dr Lovena Le  Chief Complaint:  "I've been doing alright"  History of Present Illness:    Felicia Hunter is a 78 y.o. female who presents via audio/video conferencing for a telehealth visit today. She has a h/o cryptogenic stroke, s/p ILR, with the development of atrial fib noted. She has tolerated her anti-coagulation. She remains active working in her yard. Since last being seen in our clinic, the patient reports doing very well.  Today, she denies symptoms of palpitations, chest pain, shortness of breath,  lower extremity edema, dizziness, presyncope, or syncope.  The patient is otherwise without complaint today.  The patient denies symptoms of fevers, chills, cough, or new SOB worrisome for COVID 19.  Past Medical History:  Diagnosis Date  . Alopecia   . Arthritis   . Atrial fibrillation ([place])   . Breast cancer ([place])    double mastectomy 2011  . Clavicle fracture    right clavicle-hardware retained  . Clotting disorder ([place])    with CVA- 2013, 2014  . Cough    PERSISTANT COUGH X 20 YRS  . Difficulty sleeping    takes Ambien  . Disorders of bilirubin excretion   . Diverticulosis   . Environmental and seasonal allergies    "dry cough", related to ? allergies  . GERD (gastroesophageal reflux disease)   . Hyperlipidemia   . MVP (mitral valve prolapse)   . Osteopenia   . Osteoporosis   . Stroke ([place]) 06/07/11   x2 episodes-"funny feeling"" equilibrium off x24 hours, x1 speech affected- no residuallast 1 yr ago.  . Tubulovillous adenoma of colon 08/2003    Past Surgical History:  Procedure Laterality Date  . ABDOMINAL HYSTERECTOMY  1993  . BREAST CAPSULOTOMY     after bilat mastectomies-implants removed  . BREAST ENHANCEMENT SURGERY     x3-then removed 2011 for bilat mastectomies  . BREAST SURGERY  june 2011   double mastectomy nodes from rt  . CHEST WALL TUMOR EXCISION  5/12   noduals removed-neg  . collarbone  september 2011   pinned and plated, right  . COLONOSCOPY    . JOINT REPLACEMENT  july 9th, 2012   left knee replacement , Right knee replacement 09-13-17 Dr. Alvan Dame  . KNEE RECONSTRUCTION  09/2010   revision lt total knee   left  . LOOP RECORDER IMPLANT N/A 11/07/2013   Procedure: LOOP RECORDER IMPLANT;  Surgeon: Evans Lance, MD;  Location: [facility] CATH LAB;  Service: Cardiovascular;  Laterality: N/A;  . LOOP RECORDER REMOVAL N/A 05/16/2017   Procedure: LOOP RECORDER REMOVAL;  Surgeon: Evans Lance, MD;  Location: [facility] CV LAB;  Service: Cardiovascular;  Laterality: N/A;  . MASS EXCISION Left 06/19/2012   Procedure: EXCISION OF LARGE MASS LEFT NECK/SHOULDER WITH PLASTIC CLOSURE;  Surgeon: Cristine Polio, MD;  Location: [facility];  Service: Plastics;  Laterality: Left;  . NASAL SEPTUM SURGERY    .  TEE WITHOUT CARDIOVERSION N/A 11/07/2013   Procedure: TRANSESOPHAGEAL ECHOCARDIOGRAM (TEE);  Surgeon: Dorothy Spark, MD;  Location: [place];  Service: Cardiovascular;  Laterality: N/A;  . TONSILLECTOMY    . TOTAL HIP ARTHROPLASTY Right 04/10/2013   Procedure: RIGHT TOTAL HIP ARTHROPLASTY ANTERIOR APPROACH;  Surgeon: [name], MD;  Location: [facility];  Service: Orthopedics;  Laterality: Right;  . TOTAL HIP ARTHROPLASTY Left 12/31/2014   Procedure: LEFT TOTAL HIP ARTHROPLASTY ANTERIOR APPROACH;  Surgeon: Paralee Cancel, MD;  Location: [facility];  Service: Orthopedics;   Laterality: Left;  . TOTAL KNEE ARTHROPLASTY Right 09/13/2017   Procedure: RIGHT TOTAL KNEE ARTHROPLASTY;  Surgeon: Paralee Cancel, MD;  Location: [facility];  Service: Orthopedics;  Laterality: Right;  70 mins  . UPPER GASTROINTESTINAL ENDOSCOPY    . VEIN SURGERY  1974   left leg    Current Outpatient Medications  Medication Sig Dispense Refill  . acetaminophen (TYLENOL) 500 MG tablet Take 1,000 mg by mouth as needed for mild pain.    Marland Kitchen alendronate (FOSAMAX) 70 MG tablet Take 70 mg by mouth every Sunday.     Marland Kitchen atorvastatin (LIPITOR) 20 MG tablet Take 20 mg by mouth every evening.     Marland Kitchen BIOTIN PO Take 1 tablet by mouth daily.    . Calcium Carb-Cholecalciferol (CALCIUM 600/VITAMIN D3 PO) Take 1 tablet by mouth daily.    . cholecalciferol (VITAMIN D) 1000 UNITS tablet Take 1,000 Units by mouth daily.    . Multiple Vitamin (MULTIVITAMIN) capsule Take 1 capsule by mouth daily.    . Omega-3 Fatty Acids (FISH OIL PO) Take 1 capsule by mouth 2 (two) times daily.    . polyvinyl alcohol (LIQUIFILM TEARS) 1.4 % ophthalmic solution Place 1 drop into both eyes as needed for dry eyes.     . vitamin C (ASCORBIC ACID) 500 MG tablet Take 500 mg by mouth daily as needed (for cold symptoms).     Alveda Reasons 20 MG TABS tablet TAKE 1 TABLET ONCE DAILY WITH SUPPER. 90 tablet 0  . zolpidem (AMBIEN) 10 MG tablet Take 10 mg by mouth at bedtime as needed for sleep.      No current facility-administered medications for this visit.     Allergies:   Sulfonamide derivatives   Social History:  The patient  reports that she has never smoked. She has never used smokeless tobacco. She reports current alcohol use of about 1.0 standard drinks of alcohol per week. She reports that she does not use drugs.   Family History:  The patient's  family history includes Aneurysm in her father.   ROS:  Please see the history of present illness.   All other systems are personally reviewed and negative.    Exam:    Vital Signs:  BP -  135/85   Labs/Other Tests and Data Reviewed:    Recent Labs: 10/21/2017: ALT 15; BUN 13; Creatinine 0.85; Hemoglobin 13.1; Platelet Count 249; Potassium 4.2; Sodium 140   Wt Readings from Last 3 Encounters:  05/04/18 144 lb 3.2 oz (65.4 kg)  03/30/18 139 lb 3.2 oz (63.1 kg)  10/21/17 134 lb (60.8 kg)     Other studies personally reviewed: none   ASSESSMENT & PLAN:    1.  PAF - she is asymptomatic, and will continue her current meds including Xarelto.  2. Dyslipidemia - she will continue her statin. 3. COVID 19 screen The patient denies symptoms of COVID 19 at this time.  The importance of social distancing  was discussed today.  Follow-up:  prn Next remote: n/a - ILR has been removed  Current medicines are reviewed at length with the patient today.   The patient does not have concerns regarding her medicines.  The following changes were made today:  none  Labs/ tests ordered today include: none No orders of the defined types were placed in this encounter.    Patient Risk:  after full review of this patients clinical status, I feel that they are at moderate risk at this time.  Today, I have spent 15 minutes with the patient with telehealth technology discussing all of above.    Signed, Cristopher Peru, MD  07/13/2018 9:56 AM     [facility] [address] [place] [address] [phone] (office) [phone] (fax)

## 2018-08-03 ENCOUNTER — Ambulatory Visit: Payer: Medicare Other | Admitting: Adult Health

## 2018-08-08 ENCOUNTER — Ambulatory Visit (INDEPENDENT_AMBULATORY_CARE_PROVIDER_SITE_OTHER): Payer: Medicare Other

## 2018-08-08 ENCOUNTER — Encounter: Payer: Self-pay | Admitting: Adult Health

## 2018-08-08 ENCOUNTER — Other Ambulatory Visit: Payer: Self-pay

## 2018-08-08 ENCOUNTER — Ambulatory Visit (INDEPENDENT_AMBULATORY_CARE_PROVIDER_SITE_OTHER): Payer: Medicare Other | Admitting: Adult Health

## 2018-08-08 VITALS — BP 124/76 | HR 65 | Temp 97.7°F | Ht 65.0 in | Wt 142.8 lb

## 2018-08-08 DIAGNOSIS — J984 Other disorders of lung: Secondary | ICD-10-CM | POA: Diagnosis not present

## 2018-08-08 DIAGNOSIS — R05 Cough: Secondary | ICD-10-CM | POA: Diagnosis not present

## 2018-08-08 DIAGNOSIS — I6310 Cerebral infarction due to embolism of unspecified precerebral artery: Secondary | ICD-10-CM

## 2018-08-08 DIAGNOSIS — R053 Chronic cough: Secondary | ICD-10-CM | POA: Insufficient documentation

## 2018-08-08 DIAGNOSIS — R911 Solitary pulmonary nodule: Secondary | ICD-10-CM | POA: Diagnosis not present

## 2018-08-08 NOTE — Patient Instructions (Signed)
Mucinex As needed  For cough  Follow up with Dr. Elsworth Soho  In 1 year and As needed

## 2018-08-08 NOTE — Assessment & Plan Note (Signed)
Chronic cough felt secondary to probable postnasal drainage.  Currently stable  Plan  Patient Instructions  Mucinex As needed  For cough  Follow up with Dr. Elsworth Soho  In 1 year and As needed

## 2018-08-08 NOTE — Progress Notes (Signed)
@Patient  ID: Felicia Hunter, female    DOB: 1941/01/17, 78 y.o.   MRN: [id number]  Chief Complaint  Patient presents with  . Follow-up    Lung nodule    Referring provider: Deland Pretty, MD  HPI: 78 yo female never smoker followed for RLL lung nodule and Chronic cough  Medical History significant for Breast Cancer in 2011 s/p bilateral mastectomy , TIA , on Xarelto   TEST/EVENTS :  CT chest without contrast 03/27/2018 >> showed 22 x 18 mm inferior right middle lobe lobulated nodule with a tiny airway leading to it suggestive of mucoid impaction versus endobronchial lesion and postobstructive consolidation. Borderline right axillary adenopathy was noted as also external breast prosthesis.  PET scan February 2020 right middle lobe opacity decrease in interval, main linear component has resolved completely.  Area with only low level uptake likely related to airway impaction/infectious inflammatory etiology no suspicious or unexpected hypermetabolic disease noted in neck, abdomen or pelvis  08/08/2018 Follow up : Lung nodule  Patient returns for a 60-month follow-up.  Patient was seen last visit for follow-up of a right middle lobe nodule.  This initially was seen on a CT chest March 27, 2018 that showed a 22 x 18 mm right middle lobe lobulated nodule.  A subsequent PET scan on February 2020 showed decreased size of right middle lobe opacity with linear component resolved.  The area only had low level uptake.  Was felt to be a infectious/inflammatory etiology.  Patient had a follow-up chest x-ray today that showed further decrease in size of the right middle lobe nodule.. Patient denies any unintentional weight loss, hemoptysis chest pain or increased edema.  Patient does have a chronic cough that is been evaluated at [facility] in the past.  Felt to be related to probable postnasal drip.  Patient says this is been stable.  She denies any flare cough.  No fever or discolored mucus    Allergies   Allergen Reactions  . Sulfonamide Derivatives Rash    Immunization History  Administered Date(s) Administered  . Influenza Whole 12/14/2002  . Influenza, High Dose Seasonal PF 12/24/2016  . Influenza-Unspecified 01/01/2018    Past Medical History:  Diagnosis Date  . Alopecia   . Arthritis   . Atrial fibrillation ([place])   . Breast cancer ([place])    double mastectomy 2011  . Clavicle fracture    right clavicle-hardware retained  . Clotting disorder ([place])    with CVA- 2013, 2014  . Cough    PERSISTANT COUGH X 20 YRS  . Difficulty sleeping    takes Ambien  . Disorders of bilirubin excretion   . Diverticulosis   . Environmental and seasonal allergies    "dry cough", related to ? allergies  . GERD (gastroesophageal reflux disease)   . Hyperlipidemia   . MVP (mitral valve prolapse)   . Osteopenia   . Osteoporosis   . Stroke ([place]) 06/07/11   x2 episodes-"funny feeling"" equilibrium off x24 hours, x1 speech affected- no residuallast 1 yr ago.  . Tubulovillous adenoma of colon 08/2003    Tobacco History: Social History   Tobacco Use  Smoking Status Never Smoker  Smokeless Tobacco Never Used   Counseling given: Not Answered   Outpatient Medications Prior to Visit  Medication Sig Dispense Refill  . acetaminophen (TYLENOL) 500 MG tablet Take 1,000 mg by mouth as needed for mild pain.    Marland Kitchen alendronate (FOSAMAX) 70 MG tablet Take 70 mg by mouth every Sunday.     Marland Kitchen  atorvastatin (LIPITOR) 20 MG tablet Take 20 mg by mouth every evening.     Marland Kitchen BIOTIN PO Take 1 tablet by mouth daily.    . Calcium Carb-Cholecalciferol (CALCIUM 600/VITAMIN D3 PO) Take 1 tablet by mouth daily.    . cholecalciferol (VITAMIN D) 1000 UNITS tablet Take 1,000 Units by mouth daily.    . Multiple Vitamin (MULTIVITAMIN) capsule Take 1 capsule by mouth daily.    . Omega-3 Fatty Acids (FISH OIL PO) Take 1 capsule by mouth 2 (two) times daily.    . polyvinyl alcohol (LIQUIFILM TEARS) 1.4 % ophthalmic solution  Place 1 drop into both eyes as needed for dry eyes.     . vitamin C (ASCORBIC ACID) 500 MG tablet Take 500 mg by mouth daily as needed (for cold symptoms).     Alveda Reasons 20 MG TABS tablet TAKE 1 TABLET ONCE DAILY WITH SUPPER. 90 tablet 0  . zolpidem (AMBIEN) 10 MG tablet Take 10 mg by mouth at bedtime as needed for sleep.      No facility-administered medications prior to visit.      Review of Systems:   Constitutional:   No  weight loss, night sweats,  Fevers, chills, fatigue, or  lassitude.  HEENT:   No headaches,  Difficulty swallowing,  Tooth/dental problems, or  Sore throat,                No sneezing, itching, ear ache, +nasal congestion, post nasal drip,   CV:  No chest pain,  Orthopnea, PND, swelling in lower extremities, anasarca, dizziness, palpitations, syncope.   GI  No heartburn, indigestion, abdominal pain, nausea, vomiting, diarrhea, change in bowel habits, loss of appetite, bloody stools.   Resp   No chest wall deformity  Skin: no rash or lesions.  GU: no dysuria, change in color of urine, no urgency or frequency.  No flank pain, no hematuria   MS:  No joint pain or swelling.  No decreased range of motion.  No back pain.    Physical Exam  BP 124/76 (BP Location: Left Arm, Cuff Size: Normal)   Pulse 65   Temp 97.7 F (36.5 C) (Oral)   Ht 5\' 5"  (1.651 m)   Wt 142 lb 12.8 oz (64.8 kg)   SpO2 95%   BMI 23.76 kg/m   GEN: A/Ox3; pleasant , NAD, well nourished    HEENT:  [place]/AT,  EACs-clear, TMs-wnl, NOSE-clear, THROAT-clear, no lesions, no postnasal drip or exudate noted.   NECK:  Supple w/ fair ROM; no JVD; normal carotid impulses w/o bruits; no thyromegaly or nodules palpated; no lymphadenopathy.    RESP  Clear  P & A; w/o, wheezes/ rales/ or rhonchi. no accessory muscle use, no dullness to percussion  CARD:  RRR, no m/r/g, no peripheral edema, pulses intact, no cyanosis or clubbing.  GI:   Soft & nt; nml bowel sounds; no organomegaly or masses  detected.   Musco: Warm bil, no deformities or joint swelling noted.   Neuro: alert, no focal deficits noted.    Skin: Warm, no lesions or rashes    Lab Results:  CBC  BMET   BNP No results found for: BNP  ProBNP    Component Value Date/Time   PROBNP 83.2 06/07/2011 1716    Imaging: Dg Chest 2 View  Result Date: 08/08/2018 CLINICAL DATA:  Follow up right middle lobe nodule. History of atrial fibrillation and breast cancer. EXAM: CHEST - 2 VIEW COMPARISON:  Radiographs 03/09/2018. CT 03/27/2018 and  PET-CT 04/27/2018. FINDINGS: The heart size and mediastinal contours are stable with aortic tortuosity and atherosclerosis. Small nodular density at the right lung base appears improved from the prior radiographs, likely postinflammatory scarring as correlated with the PET-CT from 3 months ago. No new or enlarging nodules are identified. There is mild linear atelectasis or scarring at both lung bases. No significant pleural effusion. There are stable degenerative changes throughout the spine associated with a thoracolumbar scoliosis. There are stable postsurgical changes along the distal right clavicle and advanced right-sided glenohumeral degenerative changes. IMPRESSION: 1. The small nodular density in the right middle lobe appears improved from the radiographs of 5 months ago, probably postinflammatory scarring based on subsequent studies. Continued follow-up recommended to document longer term stability. 2. No new or enlarging nodules.  No acute findings. Electronically Signed   By: Richardean Sale M.D.   On: 08/08/2018 15:08      No flowsheet data found.  No results found for: NITRICOXIDE      Assessment & Plan:   Solitary pulmonary nodule Right middle lobe nodule first noted on CT chest January 2020, subsequent PET scan with decrease in size suggesting a benign etiology.  Follow-up chest x-ray today shows further decrease it on size.  Consistent with a benign etiology.  No  further follow-up is indicated.    Chronic cough Chronic cough felt secondary to probable postnasal drainage.  Currently stable  Plan  Patient Instructions  Mucinex As needed  For cough  Follow up with Dr. Elsworth Soho  In 1 year and As needed           Rexene Edison, NP 08/08/2018

## 2018-08-08 NOTE — Assessment & Plan Note (Signed)
Right middle lobe nodule first noted on CT chest January 2020, subsequent PET scan with decrease in size suggesting a benign etiology.  Follow-up chest x-ray today shows further decrease it on size.  Consistent with a benign etiology.  No further follow-up is indicated.

## 2018-09-26 ENCOUNTER — Other Ambulatory Visit: Payer: Self-pay | Admitting: Internal Medicine

## 2018-09-26 NOTE — Telephone Encounter (Signed)
Prescription refill request for Xarelto received.   Last office visit: Dr. Lovena Le (07-13-2018) Weight:  64.8 kg (08-08-2018) Age: 78 y.o. Scr: 0.85 (10-21-2017) CrCl: 56 ml/min  Prescription refill sent.

## 2018-09-27 ENCOUNTER — Telehealth: Payer: Self-pay | Admitting: Family

## 2018-09-27 NOTE — Telephone Encounter (Signed)
Called and spoke with patient regarding appointments being r/s due to provider out of office

## 2018-10-19 ENCOUNTER — Encounter: Payer: Self-pay | Admitting: Family

## 2018-10-19 ENCOUNTER — Telehealth: Payer: Self-pay | Admitting: Family

## 2018-10-19 ENCOUNTER — Inpatient Hospital Stay: Payer: Medicare Other | Attending: Family

## 2018-10-19 ENCOUNTER — Inpatient Hospital Stay (HOSPITAL_BASED_OUTPATIENT_CLINIC_OR_DEPARTMENT_OTHER): Payer: Medicare Other | Admitting: Family

## 2018-10-19 ENCOUNTER — Other Ambulatory Visit: Payer: Self-pay

## 2018-10-19 VITALS — BP 154/72 | HR 63 | Temp 97.8°F | Resp 17

## 2018-10-19 DIAGNOSIS — Z853 Personal history of malignant neoplasm of breast: Secondary | ICD-10-CM

## 2018-10-19 DIAGNOSIS — Z79899 Other long term (current) drug therapy: Secondary | ICD-10-CM | POA: Insufficient documentation

## 2018-10-19 DIAGNOSIS — Z86000 Personal history of in-situ neoplasm of breast: Secondary | ICD-10-CM | POA: Diagnosis not present

## 2018-10-19 DIAGNOSIS — I6310 Cerebral infarction due to embolism of unspecified precerebral artery: Secondary | ICD-10-CM | POA: Diagnosis not present

## 2018-10-19 DIAGNOSIS — Z8673 Personal history of transient ischemic attack (TIA), and cerebral infarction without residual deficits: Secondary | ICD-10-CM | POA: Insufficient documentation

## 2018-10-19 DIAGNOSIS — R911 Solitary pulmonary nodule: Secondary | ICD-10-CM | POA: Insufficient documentation

## 2018-10-19 LAB — CMP (CANCER CENTER ONLY)
ALT: 16 U/L (ref 0–44)
AST: 20 U/L (ref 15–41)
Albumin: 4.3 g/dL (ref 3.5–5.0)
Alkaline Phosphatase: 36 U/L — ABNORMAL LOW (ref 38–126)
Anion gap: 8 (ref 5–15)
BUN: 14 mg/dL (ref 8–23)
CO2: 30 mmol/L (ref 22–32)
Calcium: 9.3 mg/dL (ref 8.9–10.3)
Chloride: 103 mmol/L (ref 98–111)
Creatinine: 0.88 mg/dL (ref 0.44–1.00)
GFR, Est AFR Am: >60 mL/min (ref >60)
GFR, Estimated: >60 mL/min (ref >60)
Glucose, Bld: 104 mg/dL — ABNORMAL HIGH (ref 70–99)
Potassium: 4.2 mmol/L (ref 3.5–5.1)
Sodium: 141 mmol/L (ref 135–145)
Total Bilirubin: 1.6 mg/dL — ABNORMAL HIGH (ref 0.3–1.2)
Total Protein: 6.4 g/dL — ABNORMAL LOW (ref 6.5–8.1)

## 2018-10-19 LAB — CBC WITH DIFFERENTIAL (CANCER CENTER ONLY)
Abs Immature Granulocytes: 0.02 10*3/uL (ref 0.00–0.07)
Basophils Absolute: 0.1 10*3/uL (ref 0.0–0.1)
Basophils Relative: 1 %
Eosinophils Absolute: 0.1 10*3/uL (ref 0.0–0.5)
Eosinophils Relative: 1 %
HCT: 44.8 % (ref 36.0–46.0)
Hemoglobin: 14.3 g/dL (ref 12.0–15.0)
Immature Granulocytes: 0 %
Lymphocytes Relative: 24 %
Lymphs Abs: 1.3 10*3/uL (ref 0.7–4.0)
MCH: 31 pg (ref 26.0–34.0)
MCHC: 31.9 g/dL (ref 30.0–36.0)
MCV: 97 fL (ref 80.0–100.0)
Monocytes Absolute: 0.4 10*3/uL (ref 0.1–1.0)
Monocytes Relative: 7 %
Neutro Abs: 3.6 10*3/uL (ref 1.7–7.7)
Neutrophils Relative %: 67 %
Platelet Count: 227 10*3/uL (ref 150–400)
RBC: 4.62 MIL/uL (ref 3.87–5.11)
RDW: 13.1 % (ref 11.5–15.5)
WBC Count: 5.5 10*3/uL (ref 4.0–10.5)
nRBC: 0 % (ref 0.0–0.2)

## 2018-10-19 NOTE — Telephone Encounter (Signed)
No follow-up needed. I have referred her to the breast survivorship program but she is always welcome to follow-up with [place] as needed for any heme/onc issues that may arise in the future. Per 8/6 los

## 2018-10-19 NOTE — Progress Notes (Signed)
Hematology and Oncology Follow Up Visit  Felicia Hunter [id number] 10-Oct-1940 78 y.o. 10/19/2018   Principle Diagnosis:  DCIS of the right breast Cortical stroke of the left MCA-no residual deficit  Current Therapy:   Observation Clinical trial for the CVA   Interim History:  Felicia Hunter is here today for her annual follow-up. She continues to do well and has no complaints at this time.  Bilateral chest exam today was unremarkable.  No lymphadenopathy noted.  No fever, chills, n/v, rash, dizziness, SOB, chest pain, palpitations, abdominal pain or changes in bowel or bladder habits.  She had a PET scan earlier this year to re-evaluate a nodule in the right middle lobe of her lung and this appeared to have improved and was felt to be inflammatory.  She states that she has a dry cough due to allergies and post nasal drip that has not changes in over 25 years.  No swelling, tenderness, numbness or tingling in her extremities.  She is eating well and staying hydrated. Her weight is stable.  She is going to the [facility] and doing her work out regularly.   ECOG Performance Status: 1 - Symptomatic but completely ambulatory  Medications:  Allergies as of 10/19/2018      Reactions   Sulfonamide Derivatives Rash      Medication List       Accurate as of October 19, 2018  1:33 PM. If you have any questions, ask your nurse or doctor.        acetaminophen 500 MG tablet Commonly known as: TYLENOL Take 1,000 mg by mouth as needed for mild pain.   alendronate 70 MG tablet Commonly known as: FOSAMAX Take 70 mg by mouth every Sunday.   atorvastatin 20 MG tablet Commonly known as: LIPITOR Take 20 mg by mouth every evening.   BIOTIN PO Take 1 tablet by mouth daily.   CALCIUM 600/VITAMIN D3 PO Take 1 tablet by mouth daily.   cholecalciferol 1000 units tablet Commonly known as: VITAMIN D Take 1,000 Units by mouth daily.   FISH OIL PO Take 1 capsule by mouth 2 (two) times daily.    multivitamin capsule Take 1 capsule by mouth daily.   polyvinyl alcohol 1.4 % ophthalmic solution Commonly known as: LIQUIFILM TEARS Place 1 drop into both eyes as needed for dry eyes.   vitamin C 500 MG tablet Commonly known as: ASCORBIC ACID Take 500 mg by mouth daily as needed (for cold symptoms).   Xarelto 20 MG Tabs tablet Generic drug: rivaroxaban TAKE 1 TABLET ONCE DAILY WITH SUPPER.   zolpidem 10 MG tablet Commonly known as: AMBIEN Take 10 mg by mouth at bedtime as needed for sleep.       Allergies:  Allergies  Allergen Reactions  . Sulfonamide Derivatives Rash    Past Medical History, Surgical history, Social history, and Family History were reviewed and updated.  Review of Systems: All other 10 point review of systems is negative.   Physical Exam:  vitals were not taken for this visit.   Wt Readings from Last 3 Encounters:  08/08/18 142 lb 12.8 oz (64.8 kg)  05/04/18 144 lb 3.2 oz (65.4 kg)  03/30/18 139 lb 3.2 oz (63.1 kg)    Ocular: Sclerae unicteric, pupils equal, round and reactive to light Ear-nose-throat: Oropharynx clear, dentition fair Lymphatic: No axillary, cervical or supraclavicular adenopathy Lungs no rales or rhonchi, good excursion bilaterally Heart regular rate and rhythm, no murmur appreciated Abd soft, nontender, positive bowel sounds,  no liver or spleen tip palpated on exam, no fluid wave  MSK no focal spinal tenderness, no joint edema Neuro: non-focal, well-oriented, appropriate affect Breasts: Bilateral mastectomies intact, no mass, lesion or rash noted on exam.   Lab Results  Component Value Date   WBC 6.8 10/21/2017   HGB 13.1 10/21/2017   HCT 41.2 10/21/2017   MCV 99.5 10/21/2017   PLT 249 10/21/2017   No results found for: FERRITIN, IRON, TIBC, UIBC, IRONPCTSAT Lab Results  Component Value Date   RBC 4.14 10/21/2017   No results found for: KPAFRELGTCHN, LAMBDASER, KAPLAMBRATIO No results found for: IGGSERUM, IGA,  IGMSERUM No results found for: Odetta Pink, SPEI   Chemistry      Component Value Date/Time   NA 140 10/21/2017 1351   NA 139 10/21/2016 1335   K 4.2 10/21/2017 1351   K 4.1 10/21/2016 1335   CL 105 10/21/2017 1351   CL 106 10/21/2016 1335   CO2 25 10/21/2017 1351   CO2 26 10/21/2016 1335   BUN 13 10/21/2017 1351   BUN 16 10/21/2016 1335   CREATININE 0.85 10/21/2017 1351   CREATININE 0.9 10/21/2016 1335      Component Value Date/Time   CALCIUM 9.4 10/21/2017 1351   CALCIUM 9.4 10/21/2016 1335   ALKPHOS 41 10/21/2017 1351   ALKPHOS 40 10/21/2016 1335   AST 18 10/21/2017 1351   ALT 15 10/21/2017 1351   ALT 24 10/21/2016 1335   BILITOT 1.6 (H) 10/21/2017 1351       Impression and Plan:  Felicia Hunter is a very pleasant 78 yo caucasian female with history of ductal carcinoma in situ of the right breast diagnosed in 2011 with bilateral mastectomies. She continues to do well and there has been no evidence of recurrence.  She is now 9 years our and we discussed referring her to the survivorship program. She would like to do this so I have placed the referral.  We will now let her go from our office and follow-up with her only if needed for any future heme/onc issues that may arise.  She will contact our office with any questions or concerns.   [name], NP 8/6/20201:33 PM

## 2018-10-20 ENCOUNTER — Other Ambulatory Visit: Payer: Medicare Other

## 2018-10-20 ENCOUNTER — Ambulatory Visit: Payer: Medicare Other | Admitting: Family

## 2018-10-23 ENCOUNTER — Encounter: Payer: Self-pay | Admitting: Gastroenterology

## 2018-11-16 DIAGNOSIS — H43813 Vitreous degeneration, bilateral: Secondary | ICD-10-CM | POA: Diagnosis not present

## 2018-11-16 DIAGNOSIS — H5213 Myopia, bilateral: Secondary | ICD-10-CM | POA: Diagnosis not present

## 2018-11-16 DIAGNOSIS — H25813 Combined forms of age-related cataract, bilateral: Secondary | ICD-10-CM | POA: Diagnosis not present

## 2018-11-16 DIAGNOSIS — H04223 Epiphora due to insufficient drainage, bilateral lacrimal glands: Secondary | ICD-10-CM | POA: Diagnosis not present

## 2018-12-09 DIAGNOSIS — Z23 Encounter for immunization: Secondary | ICD-10-CM | POA: Diagnosis not present

## 2019-01-17 ENCOUNTER — Other Ambulatory Visit: Payer: Self-pay

## 2019-01-17 DIAGNOSIS — Z20822 Contact with and (suspected) exposure to covid-19: Secondary | ICD-10-CM

## 2019-01-17 DIAGNOSIS — Z20828 Contact with and (suspected) exposure to other viral communicable diseases: Secondary | ICD-10-CM | POA: Diagnosis not present

## 2019-01-18 LAB — NOVEL CORONAVIRUS, NAA: SARS-CoV-2, NAA: NOT DETECTED

## 2019-03-20 ENCOUNTER — Telehealth: Payer: Self-pay | Admitting: Adult Health

## 2019-03-20 NOTE — Telephone Encounter (Signed)
Received call from patient who wished to cancel survivorship appt 03/22/19. Patient did not want to attend appointment at this time. Will call back to resch in the spring

## 2019-03-22 ENCOUNTER — Inpatient Hospital Stay: Payer: Medicare Other | Admitting: Adult Health

## 2019-03-25 ENCOUNTER — Ambulatory Visit: Payer: Medicare Other | Attending: Internal Medicine

## 2019-03-25 ENCOUNTER — Other Ambulatory Visit: Payer: Self-pay

## 2019-03-25 DIAGNOSIS — Z23 Encounter for immunization: Secondary | ICD-10-CM | POA: Insufficient documentation

## 2019-03-25 NOTE — Progress Notes (Signed)
   Covid-19 Vaccination Clinic  Name:  Felicia Hunter    MRN: [id number] DOB: [birth year]  03/25/2019  [name] was observed post Covid-19 immunization for 30 minutes based on pre-vaccination screening without incidence. She was provided with Vaccine Information Sheet and instruction to access the V-Safe system.   [name] was instructed to call 911 with any severe reactions post vaccine: Marland Kitchen Difficulty breathing  . Swelling of your face and throat  . A fast heartbeat  . A bad rash all over your body  . Dizziness and weakness    Immunizations Administered    Name Date Dose VIS Date Route   Pfizer COVID-19 Vaccine 03/25/2019 12:34 PM 0.3 mL 02/23/2019 Intramuscular   Manufacturer: [place]   Lot: [id number]   [place]: [id number]

## 2019-03-28 ENCOUNTER — Telehealth: Payer: Self-pay | Admitting: Adult Health

## 2019-03-28 NOTE — Telephone Encounter (Signed)
Called pt per staff message from [place] - pt request to reschedule 1/7 appt . No answer left message for patient to call back for reschedule

## 2019-04-11 ENCOUNTER — Telehealth: Payer: Self-pay | Admitting: Adult Health

## 2019-04-11 NOTE — Telephone Encounter (Signed)
Rescheduled per 1/26 sch msg, pts req. Called and left a msg. Mailing printout 

## 2019-04-15 ENCOUNTER — Ambulatory Visit: Payer: Medicare Other | Attending: Internal Medicine

## 2019-04-15 DIAGNOSIS — Z23 Encounter for immunization: Secondary | ICD-10-CM

## 2019-04-15 NOTE — Progress Notes (Signed)
   Covid-19 Vaccination Clinic  Name:  JOLEI SCHMIED    MRN: [id number] DOB: 1941/01/29  04/15/2019  Ms. Andrus was observed post Covid-19 immunization for 15 minutes without incidence. She was provided with Vaccine Information Sheet and instruction to access the V-Safe system.   Ms. Mineo was instructed to call 911 with any severe reactions post vaccine: Marland Kitchen Difficulty breathing  . Swelling of your face and throat  . A fast heartbeat  . A bad rash all over your body  . Dizziness and weakness    Immunizations Administered    Name Date Dose VIS Date Route   Pfizer COVID-19 Vaccine 04/15/2019 11:35 AM 0.3 mL 02/23/2019 Intramuscular   Manufacturer: [place]   Lot: [id number]   [place]: [id number]

## 2019-04-19 DIAGNOSIS — Z01419 Encounter for gynecological examination (general) (routine) without abnormal findings: Secondary | ICD-10-CM | POA: Diagnosis not present

## 2019-04-19 DIAGNOSIS — Z1212 Encounter for screening for malignant neoplasm of rectum: Secondary | ICD-10-CM | POA: Diagnosis not present

## 2019-04-19 DIAGNOSIS — M791 Myalgia, unspecified site: Secondary | ICD-10-CM | POA: Diagnosis not present

## 2019-04-24 DIAGNOSIS — M7989 Other specified soft tissue disorders: Secondary | ICD-10-CM | POA: Diagnosis not present

## 2019-04-24 DIAGNOSIS — M791 Myalgia, unspecified site: Secondary | ICD-10-CM | POA: Diagnosis not present

## 2019-04-24 DIAGNOSIS — M79641 Pain in right hand: Secondary | ICD-10-CM | POA: Diagnosis not present

## 2019-04-24 DIAGNOSIS — M19041 Primary osteoarthritis, right hand: Secondary | ICD-10-CM | POA: Diagnosis not present

## 2019-04-24 DIAGNOSIS — M353 Polymyalgia rheumatica: Secondary | ICD-10-CM | POA: Diagnosis not present

## 2019-04-24 DIAGNOSIS — M79642 Pain in left hand: Secondary | ICD-10-CM | POA: Diagnosis not present

## 2019-04-24 DIAGNOSIS — M810 Age-related osteoporosis without current pathological fracture: Secondary | ICD-10-CM | POA: Diagnosis not present

## 2019-04-24 DIAGNOSIS — M1990 Unspecified osteoarthritis, unspecified site: Secondary | ICD-10-CM | POA: Diagnosis not present

## 2019-04-24 DIAGNOSIS — R700 Elevated erythrocyte sedimentation rate: Secondary | ICD-10-CM | POA: Diagnosis not present

## 2019-04-24 DIAGNOSIS — M2550 Pain in unspecified joint: Secondary | ICD-10-CM | POA: Diagnosis not present

## 2019-05-01 ENCOUNTER — Telehealth: Payer: Self-pay | Admitting: Gastroenterology

## 2019-05-01 NOTE — Telephone Encounter (Signed)
Patient advised that based on the guidelines she is due for a recall colonoscopy due to her history of polyps.  She wants to "think about it" and will call back when she is ready to schedule.

## 2019-05-01 NOTE — Telephone Encounter (Signed)
Pt called requesting if Dr. Fuller Plan could review her case. She was due for colon last year but she states that she has been feeling fine and she is wondering if she still needs to have colonoscopies so often giving her age. Pls call her.

## 2019-05-09 NOTE — Progress Notes (Signed)
CLINIC:  Survivorship   REASON FOR VISIT:  Routine follow-up for history of breast cancer.   BRIEF ONCOLOGIC HISTORY:  DCIS diagnosed in 2011, s/p bilateral mastectomies   INTERVAL HISTORY:  [name] presents to the [facility] today for routine follow-up for her history of breast cancer.  Overall, she reports feeling quite well.   Felicia notes she was diagnosed with polymyalgia rheumatica.  She is taking prednisone and her pain is much improved.  She is seeing Dr. Elsworth Soho for this.  She is seeing her PCP regularly.  She is exercising regularly.  She occasionally sees dermatology.  She has h/o vitiligo.      REVIEW OF SYSTEMS:  Review of Systems  Constitutional: Negative for appetite change, chills, fatigue, fever and unexpected weight change.  HENT:   Negative for hearing loss, lump/mass, sore throat and trouble swallowing.   Eyes: Negative for eye problems and icterus.  Respiratory: Negative for chest tightness, cough and shortness of breath.   Cardiovascular: Negative for chest pain, leg swelling and palpitations.  Gastrointestinal: Negative for abdominal distention, abdominal pain, constipation, diarrhea, nausea and vomiting.  Endocrine: Negative for hot flashes.  Genitourinary: Negative for difficulty urinating.   Skin: Negative for itching and rash.  Neurological: Negative for dizziness, extremity weakness, headaches and numbness.  Hematological: Negative for adenopathy.  Psychiatric/Behavioral: Negative for depression. The patient is not nervous/anxious.    Breast: Denies any new nodularity, masses, tenderness, nipple changes, or nipple discharge.       PAST MEDICAL/SURGICAL HISTORY:  Past Medical History:  Diagnosis Date  . Alopecia   . Arthritis   . Atrial fibrillation ([place])   . Breast cancer ([place])    double mastectomy 2011  . Clavicle fracture    right clavicle-hardware retained  . Clotting disorder ([place])    with CVA- 2013, 2014  . Cough     PERSISTANT COUGH X 20 YRS  . Difficulty sleeping    takes Ambien  . Disorders of bilirubin excretion   . Diverticulosis   . Environmental and seasonal allergies    "dry cough", related to ? allergies  . GERD (gastroesophageal reflux disease)   . Hyperlipidemia   . MVP (mitral valve prolapse)   . Osteopenia   . Osteoporosis   . Stroke ([place]) 06/07/11   x2 episodes-"funny feeling"" equilibrium off x24 hours, x1 speech affected- no residuallast 1 yr ago.  . Tubulovillous adenoma of colon 08/2003   Past Surgical History:  Procedure Laterality Date  . ABDOMINAL HYSTERECTOMY  1993  . BREAST CAPSULOTOMY     after bilat mastectomies-implants removed  . BREAST ENHANCEMENT SURGERY     x3-then removed 2011 for bilat mastectomies  . BREAST SURGERY  june 2011   double mastectomy nodes from rt  . CHEST WALL TUMOR EXCISION  5/12   noduals removed-neg  . collarbone  september 2011   pinned and plated, right  . COLONOSCOPY    . JOINT REPLACEMENT  july 9th, 2012   left knee replacement , Right knee replacement 09-13-17 Dr. Alvan Dame  . KNEE RECONSTRUCTION  09/2010   revision lt total knee   left  . LOOP RECORDER IMPLANT N/A 11/07/2013   Procedure: LOOP RECORDER IMPLANT;  Surgeon: Evans Lance, MD;  Location[facility] C[facility] LAB;  Service: Cardiovascular;  Laterality: N/A;  . LOOP RECORDER REMOVAL N/A 05/16/2017   Procedure: LOOP RECORDER REMOVAL;  Surgeon: Evans Lance, MD;  Location: [facility] CV LAB;  Service: Cardiovascular;  Laterality: N/A;  .  MASS EXCISION Left 06/19/2012   Procedure: EXCISION OF LARGE MASS LEFT NECK/SHOULDER WITH PLASTIC CLOSURE;  Surgeon: Cristine Polio, MD;  Location: [facility];  Service: Plastics;  Laterality: Left;  . NASAL SEPTUM SURGERY    . TEE WITHOUT CARDIOVERSION N/A 11/07/2013   Procedure: TRANSESOPHAGEAL ECHOCARDIOGRAM (TEE);  Surgeon: Dorothy Spark, MD;  Location: [facility];  Service: Cardiovascular;  Laterality: N/A;  . TONSILLECTOMY    . TOTAL  HIP ARTHROPLASTY Right 04/10/2013   Procedure: RIGHT TOTAL HIP ARTHROPLASTY ANTERIOR APPROACH;  Surgeon: [name], MD;  Location: [facility];  Service: Orthopedics;  Laterality: Right;  . TOTAL HIP ARTHROPLASTY Left 12/31/2014   Procedure: LEFT TOTAL HIP ARTHROPLASTY ANTERIOR APPROACH;  Surgeon: Paralee Cancel, MD;  Location: [facility];  Service: Orthopedics;  Laterality: Left;  . TOTAL KNEE ARTHROPLASTY Right 09/13/2017   Procedure: RIGHT TOTAL KNEE ARTHROPLASTY;  Surgeon: Paralee Cancel, MD;  Location: [facility];  Service: Orthopedics;  Laterality: Right;  70 mins  . UPPER GASTROINTESTINAL ENDOSCOPY    . VEIN SURGERY  1974   left leg     ALLERGIES:  Allergies  Allergen Reactions  . Sulfonamide Derivatives Rash     CURRENT MEDICATIONS:  Outpatient Encounter Medications as of 05/10/2019  Medication Sig  . acetaminophen (TYLENOL) 500 MG tablet Take 1,000 mg by mouth as needed for mild pain.  Marland Kitchen alendronate (FOSAMAX) 70 MG tablet Take 70 mg by mouth every Sunday.   Marland Kitchen BIOTIN PO Take 1 tablet by mouth daily.  . Calcium Carb-Cholecalciferol (CALCIUM 600/VITAMIN D3 PO) Take 1 tablet by mouth daily.  . cholecalciferol (VITAMIN D) 1000 UNITS tablet Take 1,000 Units by mouth daily.  . Multiple Vitamin (MULTIVITAMIN) capsule Take 1 capsule by mouth daily.  . Omega-3 Fatty Acids (FISH OIL PO) Take 1 capsule by mouth 2 (two) times daily.  . polyvinyl alcohol (LIQUIFILM TEARS) 1.4 % ophthalmic solution Place 1 drop into both eyes as needed for dry eyes.   . predniSONE (DELTASONE) 5 MG tablet Take 20 mg by mouth daily.  . vitamin C (ASCORBIC ACID) 500 MG tablet Take 500 mg by mouth daily as needed (for cold symptoms).   Felicia Hunter 20 MG TABS tablet TAKE 1 TABLET ONCE DAILY WITH SUPPER.  Marland Kitchen zolpidem (AMBIEN) 10 MG tablet Take 10 mg by mouth at bedtime as needed for sleep.   . [DISCONTINUED] amoxicillin (AMOXIL) 500 MG capsule   . [DISCONTINUED] atorvastatin (LIPITOR) 20 MG tablet Take 20 mg by mouth every  evening.   . [DISCONTINUED] nitrofurantoin (MACRODANTIN) 100 MG capsule    No facility-administered encounter medications on file as of 05/10/2019.     ONCOLOGIC FAMILY HISTORY:  Family History  Problem Relation Age of Onset  . Aneurysm Father   . Colon cancer Neg Hx   . Esophageal cancer Neg Hx   . Rectal cancer Neg Hx   . Stomach cancer Neg Hx     GENETIC COUNSELING/TESTING: Not at this time  SOCIAL HISTORY:  Social History   Socioeconomic History  . Marital status: Widowed    Spouse name: Not on file  . Number of children: 2  . Years of education: college  . Highest education level: Not on file  Occupational History  . Occupation: retired    Fish farm manager: Retired    Fish farm manager: RETIRED  Tobacco Use  . Smoking status: Never Smoker  . Smokeless tobacco: Never Used  Substance and Sexual Activity  . Alcohol use: Yes    Alcohol/week: 1.0  standard drinks    Types: 1 Glasses of wine per week    Comment: 1 glass of wine per night  . Drug use: No  . Sexual activity: Not Currently  Other Topics Concern  . Not on file  Social History Narrative   Patient is single with 2 children.    Patient is left handed.   Patient has college education.   Patient drinks coffee daily   Social Determinants of Health   Financial Resource Strain:   . Difficulty of Paying Living Expenses: Not on file  Food Insecurity:   . Worried About Charity fundraiser in the Last Year: Not on file  . Ran Out of Food in the Last Year: Not on file  Transportation Needs:   . Lack of Transportation (Medical): Not on file  . Lack of Transportation (Non-Medical): Not on file  Physical Activity:   . Days of Exercise per Week: Not on file  . Minutes of Exercise per Session: Not on file  Stress:   . Feeling of Stress : Not on file  Social Connections:   . Frequency of Communication with Friends and Family: Not on file  . Frequency of Social Gatherings with Friends and Family: Not on file  . Attends  Religious Services: Not on file  . Active Member of Clubs or Organizations: Not on file  . Attends Archivist Meetings: Not on file  . Marital Status: Not on file  Intimate Partner Violence:   . Fear of Current or Ex-Partner: Not on file  . Emotionally Abused: Not on file  . Physically Abused: Not on file  . Sexually Abused: Not on file     PHYSICAL EXAMINATION:  Vital Signs: Vitals:   05/10/19 1117  BP: (!) 146/84  Pulse: 82  Resp: 20  Temp: 98.2 F (36.8 C)  SpO2: 97%   Filed Weights   05/10/19 1117  Weight: 139 lb 6.4 oz (63.2 kg)   General: Well-nourished, well-appearing female in no acute distress.  Unaccompanied today.   HEENT: Head is normocephalic.  Pupils equal and reactive to light. Conjunctivae clear without exudate.  Sclerae anicteric. Oral mucosa is pink, moist.  Oropharynx is pink without lesions or erythema.  Lymph: No cervical, supraclavicular, or infraclavicular lymphadenopathy noted on palpation.  Cardiovascular: Regular rate and rhythm.Marland Kitchen Respiratory: Clear to auscultation bilaterally. Chest expansion symmetric; breathing non-labored.  Breast Exam:  -s/p bilateral mastectomies.  No sign of local recurrence. -Axilla: No axillary adenopathy bilaterally.  GI: Abdomen soft and round; non-tender, non-distended. Bowel sounds normoactive. No hepatosplenomegaly.   GU: Deferred.  Neuro: No focal deficits. Steady gait.  Psych: Mood and affect normal and appropriate for situation.  MSK: No focal spinal tenderness to palpation, full range of motion in bilateral upper extremities Extremities: No edema. Skin: Warm and dry.  LABORATORY DATA:  None for this visit   DIAGNOSTIC IMAGING:  Most recent mammogram: Not applicable, s/p bilateral mastectomies    ASSESSMENT AND PLAN:  Ms.. Felland is a pleasant 79 y.o. female with history of Stage 0 right breast DCIS, diagnosed in 2011, treated with bilateral mastectomies She presents to the [facility] for  surveillance and routine follow-up.   1. History of breast cancer:  Ms. Goeringer is currently clinically and radiographically without evidence of disease or recurrence of breast cancer. She will return in one year for LTS follow up. I encouraged her to call me with any questions or concerns before her next visit at the cancer center,  and I would be happy to see her sooner, if needed.    2. Bone health:  Deferred to patient PCP.  She was given education on specific food and activities to promote bone health.  3. Cancer screening:  Due to Ms. Castrogiovanni's history and her age, she should receive screening for skin cancers, colon cancer.   She was encouraged to follow-up with her PCP for appropriate cancer screenings.   4. Health maintenance and wellness promotion: Ms. Strohmeier was encouraged to consume 5-7 servings of fruits and vegetables per day. She was also encouraged to engage in moderate to vigorous exercise for 30 minutes per day most days of the week. She was instructed to limit her alcohol consumption and continue to abstain from tobacco use.  Dispo:  -Return to cancer center in one year for LTS follow up   Total encounter time: 30 minutes*  Wilber Bihari, NP 05/10/19 11:46 AM Medical Oncology and Hematology [facility] [place], [place] [address] Tel. [phone]    Fax. [phone]    Note: PRIMARY CARE PROVIDER Deland Pretty, [place] [id number]  *Total Encounter Time as defined by the Centers for Medicare and Medicaid Services includes, in addition to the face-to-face time of a patient visit (documented in the note above) non-face-to-face time: obtaining and reviewing outside history, ordering and reviewing medications, tests or procedures, care coordination (communications with other health care professionals or caregivers) and documentation in the medical record.

## 2019-05-10 ENCOUNTER — Inpatient Hospital Stay: Payer: Medicare Other | Attending: Adult Health | Admitting: Adult Health

## 2019-05-10 ENCOUNTER — Encounter: Payer: Self-pay | Admitting: Adult Health

## 2019-05-10 ENCOUNTER — Other Ambulatory Visit: Payer: Self-pay

## 2019-05-10 VITALS — BP 146/84 | HR 82 | Temp 98.2°F | Resp 20 | Ht 65.0 in | Wt 139.4 lb

## 2019-05-10 DIAGNOSIS — Z7901 Long term (current) use of anticoagulants: Secondary | ICD-10-CM | POA: Insufficient documentation

## 2019-05-10 DIAGNOSIS — M129 Arthropathy, unspecified: Secondary | ICD-10-CM | POA: Insufficient documentation

## 2019-05-10 DIAGNOSIS — Z8719 Personal history of other diseases of the digestive system: Secondary | ICD-10-CM | POA: Insufficient documentation

## 2019-05-10 DIAGNOSIS — I4891 Unspecified atrial fibrillation: Secondary | ICD-10-CM | POA: Diagnosis not present

## 2019-05-10 DIAGNOSIS — I341 Nonrheumatic mitral (valve) prolapse: Secondary | ICD-10-CM | POA: Insufficient documentation

## 2019-05-10 DIAGNOSIS — Z8673 Personal history of transient ischemic attack (TIA), and cerebral infarction without residual deficits: Secondary | ICD-10-CM | POA: Diagnosis not present

## 2019-05-10 DIAGNOSIS — Z853 Personal history of malignant neoplasm of breast: Secondary | ICD-10-CM | POA: Diagnosis not present

## 2019-05-10 DIAGNOSIS — E785 Hyperlipidemia, unspecified: Secondary | ICD-10-CM | POA: Insufficient documentation

## 2019-05-10 DIAGNOSIS — Z79899 Other long term (current) drug therapy: Secondary | ICD-10-CM | POA: Insufficient documentation

## 2019-05-10 DIAGNOSIS — Z9012 Acquired absence of left breast and nipple: Secondary | ICD-10-CM | POA: Insufficient documentation

## 2019-05-10 DIAGNOSIS — M353 Polymyalgia rheumatica: Secondary | ICD-10-CM | POA: Diagnosis not present

## 2019-05-10 DIAGNOSIS — M810 Age-related osteoporosis without current pathological fracture: Secondary | ICD-10-CM | POA: Diagnosis not present

## 2019-05-10 DIAGNOSIS — K219 Gastro-esophageal reflux disease without esophagitis: Secondary | ICD-10-CM | POA: Insufficient documentation

## 2019-05-11 ENCOUNTER — Telehealth: Payer: Self-pay | Admitting: Adult Health

## 2019-05-11 NOTE — Telephone Encounter (Signed)
I talk with patient regarding schedule  

## 2019-05-14 DIAGNOSIS — M353 Polymyalgia rheumatica: Secondary | ICD-10-CM | POA: Diagnosis not present

## 2019-05-14 DIAGNOSIS — R700 Elevated erythrocyte sedimentation rate: Secondary | ICD-10-CM | POA: Diagnosis not present

## 2019-05-14 DIAGNOSIS — M1990 Unspecified osteoarthritis, unspecified site: Secondary | ICD-10-CM | POA: Diagnosis not present

## 2019-05-14 DIAGNOSIS — M810 Age-related osteoporosis without current pathological fracture: Secondary | ICD-10-CM | POA: Diagnosis not present

## 2019-05-14 DIAGNOSIS — M791 Myalgia, unspecified site: Secondary | ICD-10-CM | POA: Diagnosis not present

## 2019-05-14 DIAGNOSIS — M2550 Pain in unspecified joint: Secondary | ICD-10-CM | POA: Diagnosis not present

## 2019-05-14 DIAGNOSIS — Z7952 Long term (current) use of systemic steroids: Secondary | ICD-10-CM | POA: Diagnosis not present

## 2019-06-21 IMAGING — CT CT HEAD W/O CM
4 series · 16 of 47 positions shown, 18 images · non-contrast
Comparison: March 21, 2015

CLINICAL DATA: Pain following fall

EXAM:
CT HEAD WITHOUT CONTRAST
TECHNIQUE: Contiguous axial images were obtained from the base of the skull
through the vertex without intravenous contrast.

[Series 3: head without · axial · non-contrast · 0.39mm/px · z∈[-74,+46]mm · 7 of 32 slices shown, 9 images]
[im 4/32  brain]
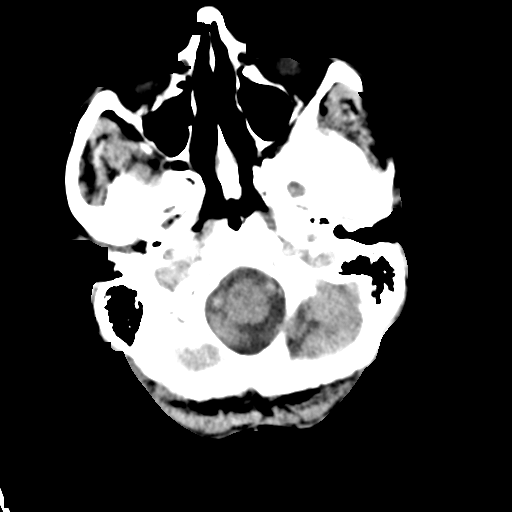
[im 4/32  bone]
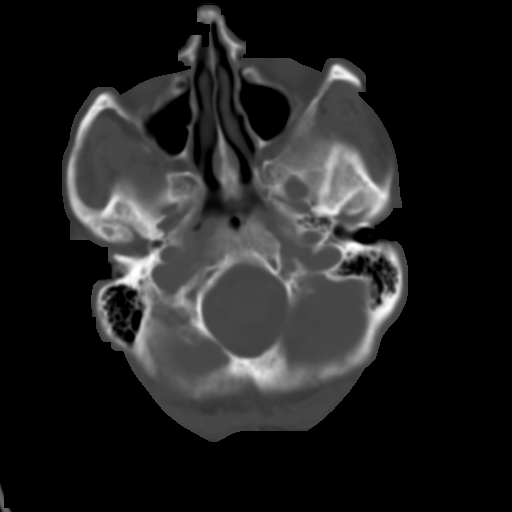
[im 8/32  brain]
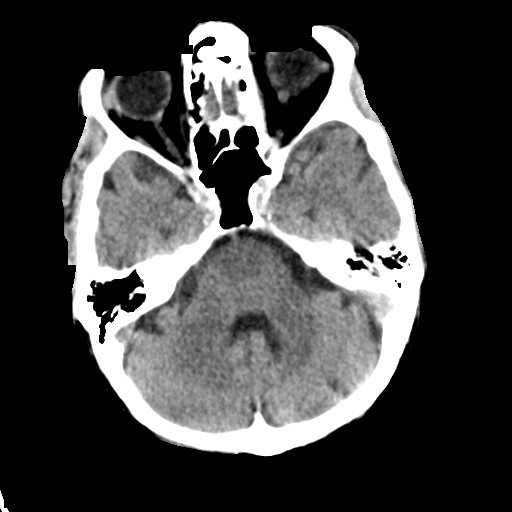
[im 12/32  brain]
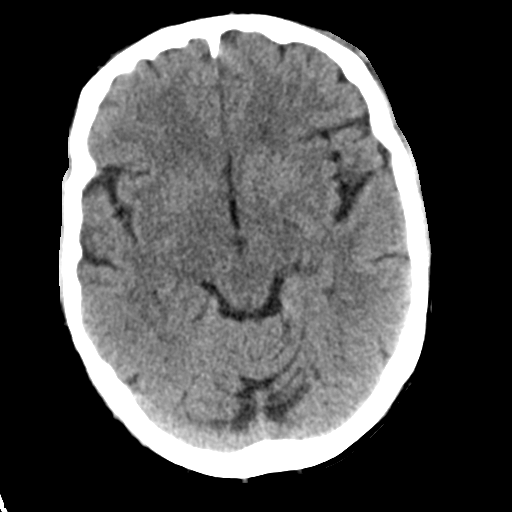
[im 16/32  brain]
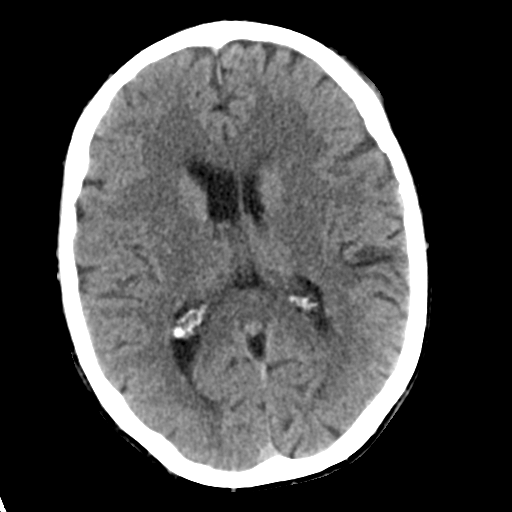
[im 20/32  brain]
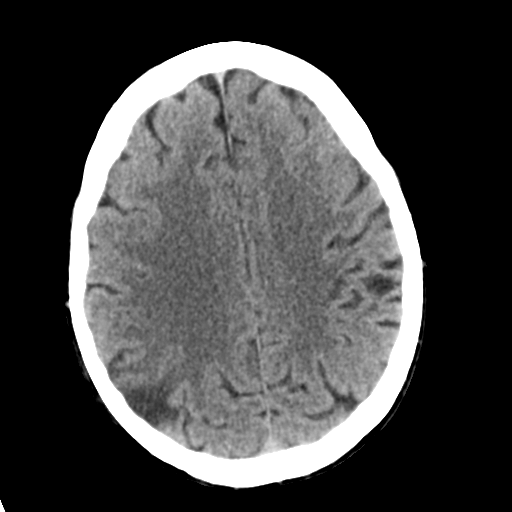
[im 20/32  bone]
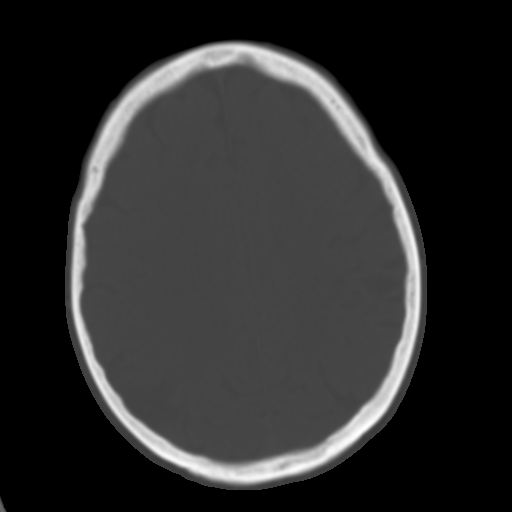
[im 24/32  brain]
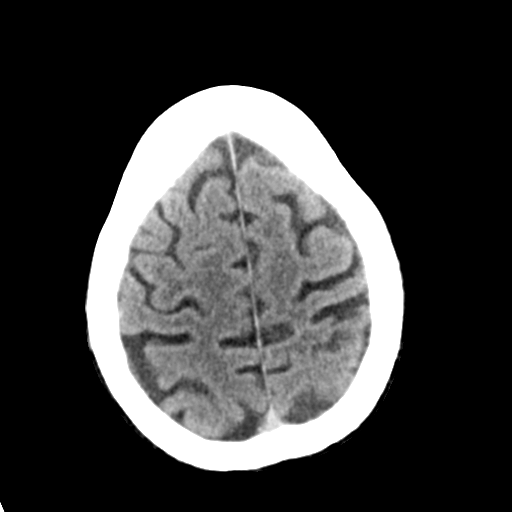
[im 28/32  brain]
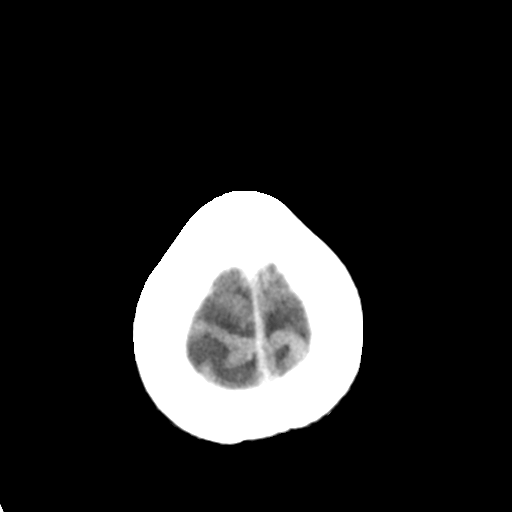

[Series 4: head bone · axial · 0.39mm/px · z∈[-75,-43]mm · 3 of 79 slices shown]
[im 8/79  bone]
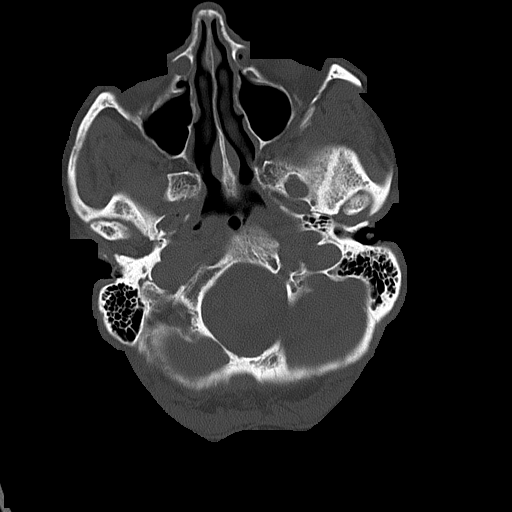
[im 16/79  bone]
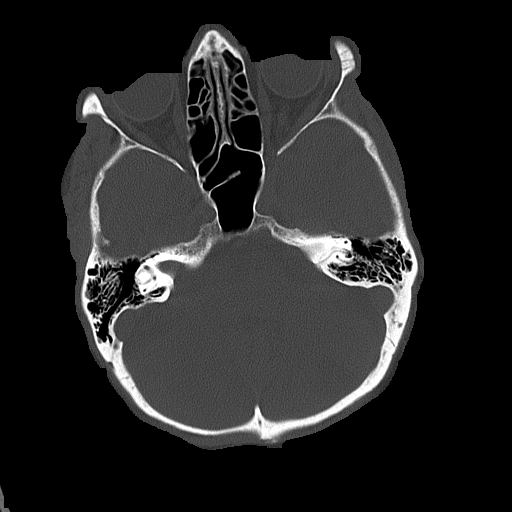
[im 24/79  bone]
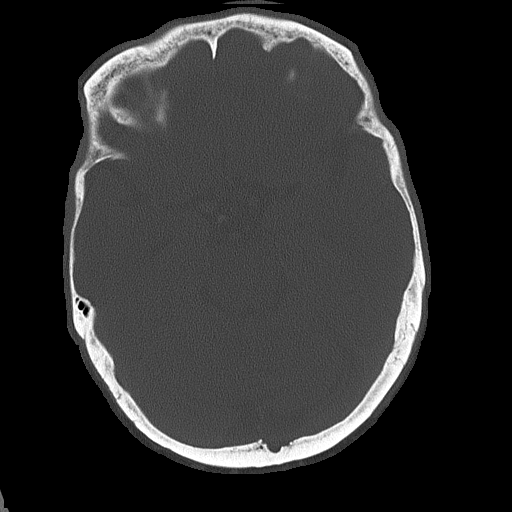

[Series 5: head without cor · coronal · non-contrast · 0.30mm/px · 3 of 67 slices shown]
[im 23/67  brain]
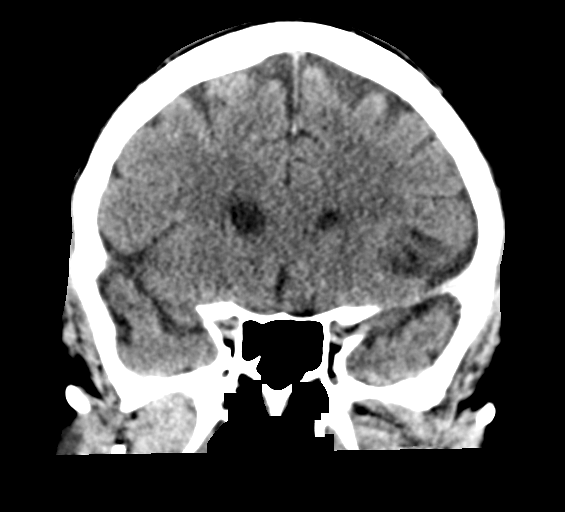
[im 30/67  brain]
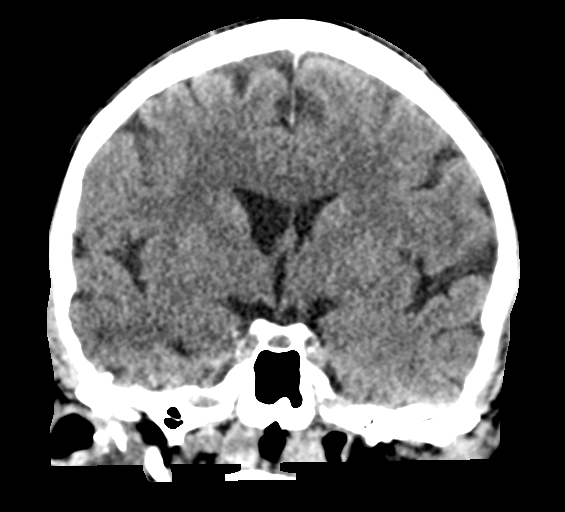
[im 37/67  brain]
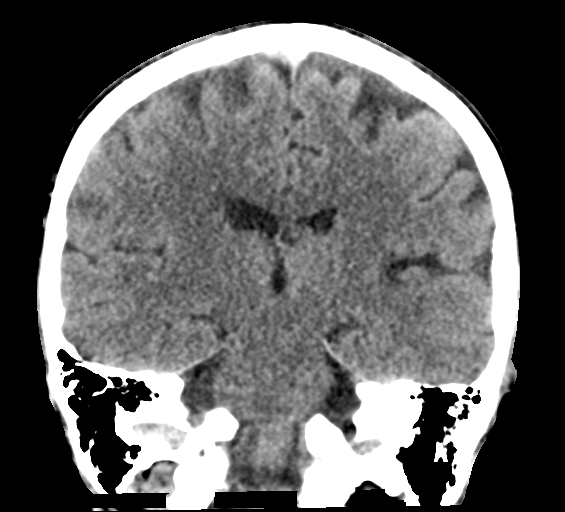

[Series 6: head without sag · sagittal · non-contrast · 0.31mm/px · 3 of 57 slices shown]
[im 19/57  brain]
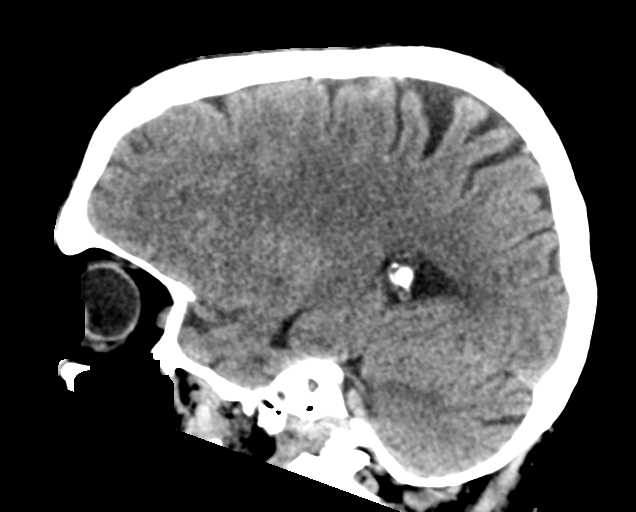
[im 29/57  brain]
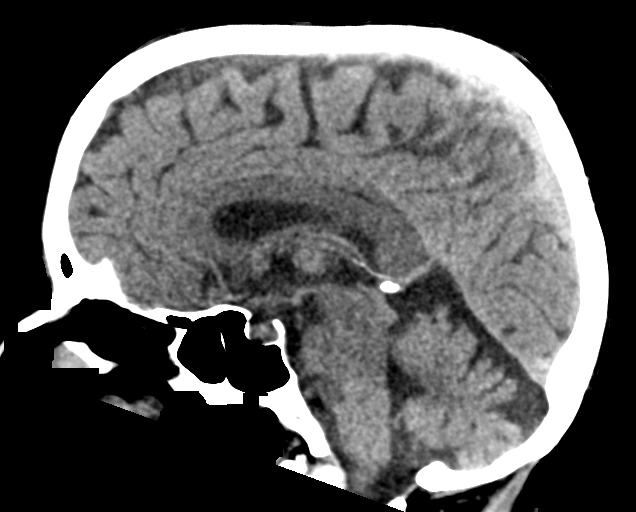
[im 38/57  brain]
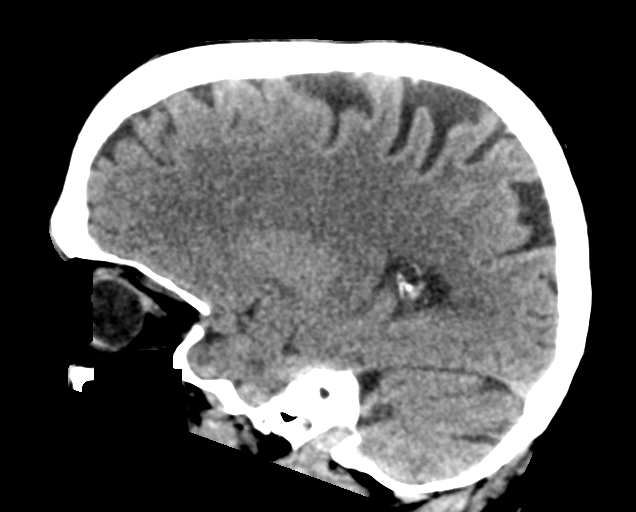

[16 of 47 positions shown; findings below may reference images not displayed]

FINDINGS: Brain: The ventricles and sulci are within normal limits for age.
The right lateral ventricle is slightly larger than the left lateral
ventricle, a presumed stable anatomic variant. There is no
intracranial mass, hemorrhage, extra-axial fluid collection, or
midline shift. There is an infarct in the posterior inferior left
cerebellum. There is evidence of a prior infarct at the left
frontal-parietal junction as well as an infarct in the anterior
right parietal lobe, stable. There is slight small vessel disease in
the centra semiovale bilaterally. No acute appearing infarct is
demonstrable.

Vascular: There is no hyperdense vessel. There are foci of
calcification in each carotid siphon region.

Skull: The bony calvarium appears intact. There is a left frontal
scalp hematoma.

Sinuses/Orbits: There is mucosal thickening in several ethmoid air
cells. Other visualized paranasal sinuses are clear. Orbits appear
symmetric bilaterally.

Other: Visualized mastoid air cells are clear. There is debris in
each external auditory canal.
IMPRESSION: 1. Prior infarcts in the inferior left cerebellum, right parietal
lobe, and left frontal-parietal junction regions. Mild
periventricular small vessel disease. No acute infarct. No mass or
hemorrhage.

2.  Left frontal scalp hematoma.  No fracture evident.

3.  There are foci of arterial vascular calcification.

4.  There is mucosal thickening in several ethmoid air cells.

5.  There is probable cerumen in each external auditory canal.

## 2019-07-16 DIAGNOSIS — M1990 Unspecified osteoarthritis, unspecified site: Secondary | ICD-10-CM | POA: Diagnosis not present

## 2019-07-16 DIAGNOSIS — Z7952 Long term (current) use of systemic steroids: Secondary | ICD-10-CM | POA: Diagnosis not present

## 2019-07-16 DIAGNOSIS — M353 Polymyalgia rheumatica: Secondary | ICD-10-CM | POA: Diagnosis not present

## 2019-07-16 DIAGNOSIS — M2550 Pain in unspecified joint: Secondary | ICD-10-CM | POA: Diagnosis not present

## 2019-07-16 DIAGNOSIS — R700 Elevated erythrocyte sedimentation rate: Secondary | ICD-10-CM | POA: Diagnosis not present

## 2019-07-16 DIAGNOSIS — M810 Age-related osteoporosis without current pathological fracture: Secondary | ICD-10-CM | POA: Diagnosis not present

## 2019-07-16 DIAGNOSIS — M549 Dorsalgia, unspecified: Secondary | ICD-10-CM | POA: Diagnosis not present

## 2019-07-16 DIAGNOSIS — M791 Myalgia, unspecified site: Secondary | ICD-10-CM | POA: Diagnosis not present

## 2019-07-19 ENCOUNTER — Other Ambulatory Visit: Payer: Self-pay | Admitting: Internal Medicine

## 2019-07-19 NOTE — Telephone Encounter (Signed)
Called and spoke to pt and made her aware that to continue refilling Xarelto pt will need to set up an appointment to see one of our cardiologist. Transferred pt to the scheduler to make appointment.

## 2019-07-19 NOTE — Telephone Encounter (Signed)
Pt scheduled to see Oda Kilts tomorrow. Prescription refill sent.

## 2019-07-19 NOTE — Telephone Encounter (Addendum)
Prescription refill request for Xarelto received.   Last office visit: Felicia Hunter 07/13/2018 Weight: 63.2 kg Age: 79 y.o. Scr: 0.88, 10/19/2018 CrCl: 51.72 ml/min   Pt overdue for an office visit.

## 2019-07-20 ENCOUNTER — Ambulatory Visit (INDEPENDENT_AMBULATORY_CARE_PROVIDER_SITE_OTHER): Payer: Medicare Other | Admitting: Student

## 2019-07-20 ENCOUNTER — Telehealth: Payer: Self-pay

## 2019-07-20 ENCOUNTER — Encounter: Payer: Self-pay | Admitting: Student

## 2019-07-20 ENCOUNTER — Other Ambulatory Visit: Payer: Self-pay

## 2019-07-20 VITALS — BP 144/80 | HR 68 | Ht 65.0 in | Wt 143.0 lb

## 2019-07-20 DIAGNOSIS — I159 Secondary hypertension, unspecified: Secondary | ICD-10-CM | POA: Diagnosis not present

## 2019-07-20 DIAGNOSIS — I480 Paroxysmal atrial fibrillation: Secondary | ICD-10-CM | POA: Diagnosis not present

## 2019-07-20 DIAGNOSIS — Z8673 Personal history of transient ischemic attack (TIA), and cerebral infarction without residual deficits: Secondary | ICD-10-CM

## 2019-07-20 MED ORDER — RIVAROXABAN 20 MG PO TABS: 20.0000 mg | ORAL_TABLET | Freq: Every day | ORAL | 3 refills | Status: DC

## 2019-07-20 NOTE — Progress Notes (Signed)
PCP:  Deland Pretty, MD Primary Cardiologist: Cristopher Peru, MD Electrophysiologist: Cristopher Peru, MD   Felicia Hunter is a 79 y.o. female seen today for Cristopher Peru, MD for routine electrophysiology followup.  Since last being seen in our clinic the patient reports doing very well overall. She was started on chronic prednisone for polymyalgia rheumatica since last visit, and states this has helped her symptoms greatly. She does not monitor her BP at home currently.  she denies exertional chest pain, palpitations, dyspnea, PND, orthopnea, nausea, vomiting, dizziness, syncope, edema, weight gain, bleeding on Xarelto, or early satiety.  Device history ILR implanted for cryptogenic stroke 10/2013, PAF identified -> removed 2019  Past Medical History:  Diagnosis Date  . Alopecia   . Arthritis   . Atrial fibrillation ([place])   . Breast cancer ([place])    double mastectomy 2011  . Clavicle fracture    right clavicle-hardware retained  . Clotting disorder ([place])    with CVA- 2013, 2014  . Cough    PERSISTANT COUGH X 20 YRS  . Difficulty sleeping    takes Ambien  . Disorders of bilirubin excretion   . Diverticulosis   . Environmental and seasonal allergies    "dry cough", related to ? allergies  . GERD (gastroesophageal reflux disease)   . Hyperlipidemia   . MVP (mitral valve prolapse)   . Osteopenia   . Osteoporosis   . Stroke ([place]) 06/07/11   x2 episodes-"funny feeling"" equilibrium off x24 hours, x1 speech affected- no residuallast 1 yr ago.  . Tubulovillous adenoma of colon 08/2003   Past Surgical History:  Procedure Laterality Date  . ABDOMINAL HYSTERECTOMY  1993  . BREAST CAPSULOTOMY     after bilat mastectomies-implants removed  . BREAST ENHANCEMENT SURGERY     x3-then removed 2011 for bilat mastectomies  . BREAST SURGERY  june 2011   double mastectomy nodes from rt  . CHEST WALL TUMOR EXCISION  5/12   noduals removed-neg  . collarbone  september 2011   pinned and plated,  right  . COLONOSCOPY    . JOINT REPLACEMENT  july 9th, 2012   left knee replacement , Right knee replacement 09-13-17 Dr. Alvan Dame  . KNEE RECONSTRUCTION  09/2010   revision lt total knee   left  . LOOP RECORDER IMPLANT N/A 11/07/2013   Procedure: LOOP RECORDER IMPLANT;  Surgeon: Evans Lance, MD;  Location: [facility] CATH LAB;  Service: Cardiovascular;  Laterality: N/A;  . LOOP RECORDER REMOVAL N/A 05/16/2017   Procedure: LOOP RECORDER REMOVAL;  Surgeon: Evans Lance, MD;  Location: [place] CV LAB;  Service: Cardiovascular;  Laterality: N/A;  . MASS EXCISION Left 06/19/2012   Procedure: EXCISION OF LARGE MASS LEFT NECK/SHOULDER WITH PLASTIC CLOSURE;  Surgeon: Cristine Polio, MD;  Location: [place];  Service: Plastics;  Laterality: Left;  . NASAL SEPTUM SURGERY    . TEE WITHOUT CARDIOVERSION N/A 11/07/2013   Procedure: TRANSESOPHAGEAL ECHOCARDIOGRAM (TEE);  Surgeon: Dorothy Spark, MD;  Location: [place];  Service: Cardiovascular;  Laterality: N/A;  . TONSILLECTOMY    . TOTAL HIP ARTHROPLASTY Right 04/10/2013   Procedure: RIGHT TOTAL HIP ARTHROPLASTY ANTERIOR APPROACH;  Surgeon: [name], MD;  [facility];  Service: Orthopedics;  Laterality: Right;  . TOTAL HIP ARTHROPLASTY Left 12/31/2014   Procedure: LEFT TOTAL HIP ARTHROPLASTY ANTERIOR APPROACH;  Surgeon: Paralee Cancel, MD;  Location: [facility];  Service: Orthopedics;  Laterality: Left;  . TOTAL KNEE ARTHROPLASTY Right 09/13/2017  Procedure: RIGHT TOTAL KNEE ARTHROPLASTY;  Surgeon: Paralee Cancel, MD;  [facility];  Service: Orthopedics;  Laterality: Right;  70 mins  . UPPER GASTROINTESTINAL ENDOSCOPY    . VEIN SURGERY  1974   left leg    Current Outpatient Medications  Medication Sig Dispense Refill  . acetaminophen (TYLENOL) 500 MG tablet Take 1,000 mg by mouth as needed for mild pain.    Marland Kitchen alendronate (FOSAMAX) 70 MG tablet Take 70 mg by mouth every Sunday.     Marland Kitchen BIOTIN PO Take 1 tablet by mouth daily.     . Calcium Carb-Cholecalciferol (CALCIUM 600/VITAMIN D3 PO) Take 1 tablet by mouth daily.    . cholecalciferol (VITAMIN D) 1000 UNITS tablet Take 1,000 Units by mouth daily.    Marland Kitchen ipratropium (ATROVENT) 0.06 % nasal spray as needed.    . Multiple Vitamin (MULTIVITAMIN) capsule Take 1 capsule by mouth daily.    . Omega-3 Fatty Acids (FISH OIL PO) Take 1 capsule by mouth 2 (two) times daily.    . polyvinyl alcohol (LIQUIFILM TEARS) 1.4 % ophthalmic solution Place 1 drop into both eyes as needed for dry eyes.     . predniSONE (DELTASONE) 5 MG tablet Take 10 mg by mouth daily.     . vitamin C (ASCORBIC ACID) 500 MG tablet Take 500 mg by mouth daily as needed (for cold symptoms).     Alveda Reasons 20 MG TABS tablet TAKE 1 TABLET ONCE DAILY WITH SUPPER. 90 tablet 1  . zolpidem (AMBIEN) 10 MG tablet Take 10 mg by mouth at bedtime as needed for sleep.      No current facility-administered medications for this visit.    Allergies  Allergen Reactions  . Sulfonamide Derivatives Rash    Social History   Socioeconomic History  . Marital status: Widowed    Spouse name: Not on file  . Number of children: 2  . Years of education: college  . Highest education level: Not on file  Occupational History  . Occupation: retired    Fish farm manager: Retired    Fish farm manager: RETIRED  Tobacco Use  . Smoking status: Never Smoker  . Smokeless tobacco: Never Used  Substance and Sexual Activity  . Alcohol use: Yes    Alcohol/week: 1.0 standard drinks    Types: 1 Glasses of wine per week    Comment: 1 glass of wine per night  . Drug use: No  . Sexual activity: Not Currently  Other Topics Concern  . Not on file  Social History Narrative   Patient is single with 2 children.    Patient is left handed.   Patient has college education.   Patient drinks coffee daily   Social Determinants of Health   Financial Resource Strain:   . Difficulty of Paying Living Expenses:   Food Insecurity:   . Worried About Paediatric nurse in the Last Year:   . Arboriculturist in the Last Year:   Transportation Needs:   . Film/video editor (Medical):   Marland Kitchen Lack of Transportation (Non-Medical):   Physical Activity:   . Days of Exercise per Week:   . Minutes of Exercise per Session:   Stress:   . Feeling of Stress :   Social Connections:   . Frequency of Communication with Friends and Family:   . Frequency of Social Gatherings with Friends and Family:   . Attends Religious Services:   . Active Member of Clubs or Organizations:   .  Attends Archivist Meetings:   Marland Kitchen Marital Status:   Intimate Partner Violence:   . Fear of Current or Ex-Partner:   . Emotionally Abused:   Marland Kitchen Physically Abused:   . Sexually Abused:      Review of Systems: General: No chills, fever, night sweats or weight changes  Cardiovascular:  No chest pain, dyspnea on exertion, edema, orthopnea, palpitations, paroxysmal nocturnal dyspnea Dermatological: No rash, lesions or masses Respiratory: No cough, dyspnea Urologic: No hematuria, dysuria Abdominal: No nausea, vomiting, diarrhea, bright red blood per rectum, melena, or hematemesis Neurologic: No visual changes, weakness, changes in mental status All other systems reviewed and are otherwise negative except as noted above.  Physical Exam: Vitals:   07/20/19 0859  BP: (!) 144/80  Pulse: 68  SpO2: 98%  Weight: 143 lb (64.9 kg)  Height: 5\' 5"  (1.651 m)    GEN- The patient is well appearing, alert and oriented x 3 today.   HEENT: normocephalic, atraumatic; sclera clear, conjunctiva pink; hearing intact; oropharynx clear; neck supple, no JVP Lymph- no cervical lymphadenopathy Lungs- Clear to ausculation bilaterally, normal work of breathing.  No wheezes, rales, rhonchi Heart- Regular rate and rhythm, no murmurs, rubs or gallops, PMI not laterally displaced GI- soft, non-tender, non-distended, bowel sounds present, no hepatosplenomegaly Extremities- no clubbing,  cyanosis, or edema; DP/PT/radial pulses 2+ bilaterally MS- no significant deformity or atrophy Skin- warm and dry, no rash or lesion Psych- euthymic mood, full affect Neuro- strength and sensation are intact  EKG is ordered. Personal review of EKG from today shows NSR at 68 bpm with normal intervals  Additional studies reviewed include: Previous office notes, Previous EP notes, Most recent labwork.  Assessment and Plan:  1. Paroxysmal AF Asymptomatic. NSR today by EKG Continue Xarelto for CHA2DS2VASC of at least 5 . Denies bleeding Will get recent bloodwork from PCP  2. HLD Continue statin  3. HTN Discussed this at length today.  She would prefer to monitor at home for the time being. She will keep a BP log and take to her next visit with PCP who she follows with regularly. She knows to let us know if regularly over [id number] systolic at home.   4. Polymyalgia rheumatica Symptoms greatly improved on chronic prednisone per rheumatologist. This is tapering to an unspecified chronic dose.   RTC annually. Sooner with issues.  Shirley Friar, PA-C  07/20/19 9:00 AM

## 2019-07-20 NOTE — Patient Instructions (Signed)
Medication Instructions:  none *If you need a refill on your cardiac medications before your next appointment, please call your pharmacy*   Lab Work: none If you have labs (blood work) drawn today and your tests are completely normal, you will receive your results only by: . MyChart Message (if you have MyChart) OR . A paper copy in the mail If you have any lab test that is abnormal or we need to change your treatment, we will call you to review the results.   Testing/Procedures: none   Follow-Up: At [facility], you and your health needs are our priority.  As part of our continuing mission to provide you with exceptional heart care, we have created designated Provider Care Teams.  These Care Teams include your primary Cardiologist (physician) and Advanced Practice Providers (APPs -  Physician Assistants and Nurse Practitioners) who all work together to provide you with the care you need, when you need it.    Your next appointment:   1 year(s)  The format for your next appointment:   Either In Person or Virtual  Provider:   Dr Taylor   Other Instructions  

## 2019-07-20 NOTE — Telephone Encounter (Signed)
Left message for PCP to fax recent lab work. 5/7

## 2019-08-16 DIAGNOSIS — H25813 Combined forms of age-related cataract, bilateral: Secondary | ICD-10-CM | POA: Diagnosis not present

## 2019-08-16 DIAGNOSIS — H43813 Vitreous degeneration, bilateral: Secondary | ICD-10-CM | POA: Diagnosis not present

## 2019-08-16 DIAGNOSIS — H35363 Drusen (degenerative) of macula, bilateral: Secondary | ICD-10-CM | POA: Diagnosis not present

## 2019-08-16 DIAGNOSIS — H5213 Myopia, bilateral: Secondary | ICD-10-CM | POA: Diagnosis not present

## 2019-10-29 DIAGNOSIS — M549 Dorsalgia, unspecified: Secondary | ICD-10-CM | POA: Diagnosis not present

## 2019-10-29 DIAGNOSIS — Z7952 Long term (current) use of systemic steroids: Secondary | ICD-10-CM | POA: Diagnosis not present

## 2019-10-29 DIAGNOSIS — R700 Elevated erythrocyte sedimentation rate: Secondary | ICD-10-CM | POA: Diagnosis not present

## 2019-10-29 DIAGNOSIS — M810 Age-related osteoporosis without current pathological fracture: Secondary | ICD-10-CM | POA: Diagnosis not present

## 2019-10-29 DIAGNOSIS — M353 Polymyalgia rheumatica: Secondary | ICD-10-CM | POA: Diagnosis not present

## 2019-10-29 DIAGNOSIS — M2550 Pain in unspecified joint: Secondary | ICD-10-CM | POA: Diagnosis not present

## 2019-10-29 DIAGNOSIS — M791 Myalgia, unspecified site: Secondary | ICD-10-CM | POA: Diagnosis not present

## 2019-10-29 DIAGNOSIS — M1990 Unspecified osteoarthritis, unspecified site: Secondary | ICD-10-CM | POA: Diagnosis not present

## 2019-11-20 ENCOUNTER — Ambulatory Visit: Payer: Medicare Other

## 2019-12-19 DIAGNOSIS — Z23 Encounter for immunization: Secondary | ICD-10-CM | POA: Diagnosis not present

## 2020-01-29 DIAGNOSIS — M549 Dorsalgia, unspecified: Secondary | ICD-10-CM | POA: Diagnosis not present

## 2020-01-29 DIAGNOSIS — M1990 Unspecified osteoarthritis, unspecified site: Secondary | ICD-10-CM | POA: Diagnosis not present

## 2020-01-29 DIAGNOSIS — Z7952 Long term (current) use of systemic steroids: Secondary | ICD-10-CM | POA: Diagnosis not present

## 2020-01-29 DIAGNOSIS — M353 Polymyalgia rheumatica: Secondary | ICD-10-CM | POA: Diagnosis not present

## 2020-01-29 DIAGNOSIS — M791 Myalgia, unspecified site: Secondary | ICD-10-CM | POA: Diagnosis not present

## 2020-01-29 DIAGNOSIS — M810 Age-related osteoporosis without current pathological fracture: Secondary | ICD-10-CM | POA: Diagnosis not present

## 2020-01-29 DIAGNOSIS — R700 Elevated erythrocyte sedimentation rate: Secondary | ICD-10-CM | POA: Diagnosis not present

## 2020-01-29 DIAGNOSIS — M2550 Pain in unspecified joint: Secondary | ICD-10-CM | POA: Diagnosis not present

## 2020-03-03 ENCOUNTER — Encounter: Payer: Self-pay | Admitting: Podiatry

## 2020-03-03 ENCOUNTER — Ambulatory Visit (INDEPENDENT_AMBULATORY_CARE_PROVIDER_SITE_OTHER): Payer: Medicare Other | Admitting: Podiatry

## 2020-03-03 ENCOUNTER — Ambulatory Visit (INDEPENDENT_AMBULATORY_CARE_PROVIDER_SITE_OTHER): Payer: Medicare Other

## 2020-03-03 ENCOUNTER — Other Ambulatory Visit: Payer: Self-pay

## 2020-03-03 DIAGNOSIS — M79672 Pain in left foot: Secondary | ICD-10-CM

## 2020-03-03 DIAGNOSIS — S92355A Nondisplaced fracture of fifth metatarsal bone, left foot, initial encounter for closed fracture: Secondary | ICD-10-CM

## 2020-03-05 ENCOUNTER — Other Ambulatory Visit: Payer: Self-pay | Admitting: Podiatry

## 2020-03-05 DIAGNOSIS — S92355A Nondisplaced fracture of fifth metatarsal bone, left foot, initial encounter for closed fracture: Secondary | ICD-10-CM

## 2020-03-05 NOTE — Progress Notes (Signed)
Subjective:   Patient ID: Felicia Hunter, female   DOB: 79 y.o.   MRN: [id number]   HPI Patient is developed a lot of pain in the outside the left foot over the last week and states she does not remember stepping wrong but it is severely tender and feels like she is walking on a broken bone.  Patient does not smoke likes to be active   Review of Systems  All other systems reviewed and are negative.       Objective:  Physical Exam Vitals and nursing note reviewed.  Constitutional:      Appearance: She is well-developed and well-nourished.  Cardiovascular:     Pulses: Intact distal pulses.  Pulmonary:     Effort: Pulmonary effort is normal.  Musculoskeletal:        General: Normal range of motion.  Skin:    General: Skin is warm.  Neurological:     Mental Status: She is alert.     Neurovascular status found to be intact muscle strength found to be adequate range of motion adequate.  Patient does have swelling in the outside of the left foot around the fifth metatarsal base with fluid buildup and mild varicosities of the ankle bilateral with negative Homans' sign.  Patient is found at good digit perfusion well oriented x3 and     Assessment:  Laboratory changes outside fifth metatarsal left with possibility for fracture despite no injury     Plan:  H&P x-rays reviewed discussed fracture of the Jones nature left and advised this patient on ice and placed into an air fracture walker to mobilize.  Patient will be seen back and understands ultimately she may need surgery and I made her aware of this and educated her on surgical intervention in this particular case  X-rays indicate that there is a Jones fracture left with separation

## 2020-03-10 ENCOUNTER — Telehealth: Payer: Self-pay | Admitting: Podiatry

## 2020-03-10 NOTE — Telephone Encounter (Signed)
Dont have another solution. Hopefully can reduce wearing it over next week

## 2020-03-10 NOTE — Telephone Encounter (Signed)
Patient has requested call back, wants to know if she could get a boot that doesn't cover the leg, difficulty to walk, Please advise

## 2020-03-11 DIAGNOSIS — M810 Age-related osteoporosis without current pathological fracture: Secondary | ICD-10-CM | POA: Diagnosis not present

## 2020-03-11 DIAGNOSIS — Z7982 Long term (current) use of aspirin: Secondary | ICD-10-CM | POA: Diagnosis not present

## 2020-03-11 DIAGNOSIS — E7800 Pure hypercholesterolemia, unspecified: Secondary | ICD-10-CM | POA: Diagnosis not present

## 2020-03-18 DIAGNOSIS — M791 Myalgia, unspecified site: Secondary | ICD-10-CM | POA: Diagnosis not present

## 2020-03-18 DIAGNOSIS — S92812A Other fracture of left foot, initial encounter for closed fracture: Secondary | ICD-10-CM | POA: Diagnosis not present

## 2020-03-18 DIAGNOSIS — I700 Atherosclerosis of aorta: Secondary | ICD-10-CM | POA: Diagnosis not present

## 2020-03-18 DIAGNOSIS — M353 Polymyalgia rheumatica: Secondary | ICD-10-CM | POA: Diagnosis not present

## 2020-03-18 DIAGNOSIS — Z0000 Encounter for general adult medical examination without abnormal findings: Secondary | ICD-10-CM | POA: Diagnosis not present

## 2020-03-18 DIAGNOSIS — M1990 Unspecified osteoarthritis, unspecified site: Secondary | ICD-10-CM | POA: Diagnosis not present

## 2020-03-18 DIAGNOSIS — R700 Elevated erythrocyte sedimentation rate: Secondary | ICD-10-CM | POA: Diagnosis not present

## 2020-03-18 DIAGNOSIS — I83813 Varicose veins of bilateral lower extremities with pain: Secondary | ICD-10-CM | POA: Diagnosis not present

## 2020-03-18 DIAGNOSIS — Z7952 Long term (current) use of systemic steroids: Secondary | ICD-10-CM | POA: Diagnosis not present

## 2020-03-18 DIAGNOSIS — M549 Dorsalgia, unspecified: Secondary | ICD-10-CM | POA: Diagnosis not present

## 2020-03-18 DIAGNOSIS — Z853 Personal history of malignant neoplasm of breast: Secondary | ICD-10-CM | POA: Diagnosis not present

## 2020-03-18 DIAGNOSIS — I480 Paroxysmal atrial fibrillation: Secondary | ICD-10-CM | POA: Diagnosis not present

## 2020-03-18 DIAGNOSIS — M47816 Spondylosis without myelopathy or radiculopathy, lumbar region: Secondary | ICD-10-CM | POA: Diagnosis not present

## 2020-03-18 DIAGNOSIS — M810 Age-related osteoporosis without current pathological fracture: Secondary | ICD-10-CM | POA: Diagnosis not present

## 2020-03-18 DIAGNOSIS — R059 Cough, unspecified: Secondary | ICD-10-CM | POA: Diagnosis not present

## 2020-03-18 DIAGNOSIS — M2550 Pain in unspecified joint: Secondary | ICD-10-CM | POA: Diagnosis not present

## 2020-03-18 DIAGNOSIS — D6869 Other thrombophilia: Secondary | ICD-10-CM | POA: Diagnosis not present

## 2020-03-18 DIAGNOSIS — Z8673 Personal history of transient ischemic attack (TIA), and cerebral infarction without residual deficits: Secondary | ICD-10-CM | POA: Diagnosis not present

## 2020-03-18 DIAGNOSIS — K219 Gastro-esophageal reflux disease without esophagitis: Secondary | ICD-10-CM | POA: Diagnosis not present

## 2020-03-31 ENCOUNTER — Ambulatory Visit: Payer: Medicare Other | Admitting: Podiatry

## 2020-04-02 ENCOUNTER — Other Ambulatory Visit: Payer: Self-pay

## 2020-04-02 ENCOUNTER — Ambulatory Visit (INDEPENDENT_AMBULATORY_CARE_PROVIDER_SITE_OTHER): Payer: Medicare Other | Admitting: Podiatry

## 2020-04-02 ENCOUNTER — Ambulatory Visit (INDEPENDENT_AMBULATORY_CARE_PROVIDER_SITE_OTHER): Payer: Medicare Other

## 2020-04-02 ENCOUNTER — Encounter: Payer: Self-pay | Admitting: Podiatry

## 2020-04-02 DIAGNOSIS — S92355A Nondisplaced fracture of fifth metatarsal bone, left foot, initial encounter for closed fracture: Secondary | ICD-10-CM | POA: Diagnosis not present

## 2020-04-02 DIAGNOSIS — M79672 Pain in left foot: Secondary | ICD-10-CM

## 2020-04-02 NOTE — Progress Notes (Signed)
Subjective:   Patient ID: Felicia Hunter, female   DOB: 80 y.o.   MRN: [id number]   HPI Patient states her pain has reduced quite a bit and while she still having some discomfort it is improved over where it was   ROS      Objective:  Physical Exam  Neurovascular status intact significant diminishment of discomfort within the shaft of the fifth metatarsal left with mild swelling but no other pathology noted     Assessment:  And well post fracture left fifth metatarsal     Plan:  H&P x-rays reviewed and I have recommended the continuation of immobilization ice anti-inflammatories and patient will be seen back if symptoms were to get worse  X-rays indicate that there is signs of secondary bone healing around the fifth metatarsal

## 2020-04-07 ENCOUNTER — Ambulatory Visit: Payer: Medicare Other | Admitting: Nurse Practitioner

## 2020-04-08 ENCOUNTER — Other Ambulatory Visit: Payer: Self-pay | Admitting: Nurse Practitioner

## 2020-04-08 ENCOUNTER — Ambulatory Visit (INDEPENDENT_AMBULATORY_CARE_PROVIDER_SITE_OTHER): Payer: Medicare Other | Admitting: Nurse Practitioner

## 2020-04-08 ENCOUNTER — Telehealth: Payer: Self-pay

## 2020-04-08 ENCOUNTER — Encounter: Payer: Self-pay | Admitting: Nurse Practitioner

## 2020-04-08 VITALS — BP 100/62 | HR 76 | Ht 65.0 in | Wt 136.0 lb

## 2020-04-08 DIAGNOSIS — I4891 Unspecified atrial fibrillation: Secondary | ICD-10-CM

## 2020-04-08 DIAGNOSIS — Z86010 Personal history of colonic polyps: Secondary | ICD-10-CM | POA: Diagnosis not present

## 2020-04-08 MED ORDER — SUTAB 1479-225-188 MG PO TABS: 1.0000 | ORAL_TABLET | ORAL | 0 refills | Status: DC

## 2020-04-08 NOTE — Progress Notes (Signed)
04/09/2020 Montrice Montuori Popoff [id number] 03/07/1941   CHIEF COMPLAINT: Schedule a colonoscopy   HISTORY OF PRESENT ILLNESS:  Felicia Hunter is a 80 year old female with a past medical history of arthritis, MVP, atrial fibrillation on Xarelto, CVA x 2 in 2013 and 2014, breast cancer s/p bilateral mastectomy 2011, polymyalgia rheumatica on tapering course of Prednisone, GERD and colon polyps. S/P hysterectomy, hip and total knee replacement surgery. She presents to our office today as referred by Dr. Deland Pretty to schedule a colonoscopy.  She has a prior history of tubulovillous colon polyps.  Her most recent colonoscopy was 12/10/2015, a 10 mm tubulovillous adenomatous polyp was removed by piecemeal from the rectum.  A smaller plastic polyp was removed from the sigmoid colon.  Mild diverticulosis to the sigmoid and and descending colon were noted.  A follow-up flexible sigmoidoscopy was completed 04/09/2016 without residual polyp from the prior rectal polypectomy, two 4 to 5 mm polyps were removed from the distal rectum and biopsy showed a prolapse polyp. She was advised to schedule a repeat colonoscopy 11/2018 which was not done. Currently, she reports doing well.  She denies having any upper or lower abdominal pain.  She is passing a normal formed brown bowel movement most days.  No rectal bleeding or black stools.  No GERD symptoms.  Her appetite is good and her energy level is fair.  No weight loss.  No chest pain or shortness of breath.  She remains on Xarelto for prior history of atrial fibrillation, past CVA x 2.  She was recently diagnosed with polymyalgia rheumatica treated with Prednisone which is being tapered.  No  complaints at this time. Labs 07/16/2019 showed a Hemoglobin 13.5. Hematocrit 42.9. Platelet 298.  Flexible sigmoidoscopy 04/09/2016: - Two 4 to 5 mm polyps in the distal rectum, removed with a hot biopsy forceps. Resected and retrieved. - Scar in the distal rectum. - Mild  diverticulosis in the sigmoid colon. - Colonoscopy 11/2018 recommended Surgical [P], distal rectal, polyp (2) - MUCOSAL PROLAPSE POLYP AND MUCUS. - NO ADENOMATOUS CHANGE OR EVIDENCE OF MALIGNANCY.  Colonoscopy 12/10/2015: One 10 mm polyp in the rectum, removed piecemeal using a hot snare. Resected and retrieved. - Internal hemorrhoids. - One 4 mm polyp in the sigmoid colon, removed with a cold biopsy forceps. Resected and retrieved. - Mild diverticulosis in the sigmoid colon and in the ascending colon. There was no evidence of diverticular bleeding. - The examination was otherwise normal on direct and retroflexion views. Biopsy report: 1. Surgical [P], sigmoid, polyp - HYPERPLASTIC POLYP. - NO ADENOMATOUS EPITHELIUM OR MALIGNANCY IDENTIFIED. 2. Surgical [P], rectum, polyp - TUBULOVILLOUS ADENOMA. - NO HIGH GRADE DYSPLASIA OR INVASIVE MALIGNANCY IDENTIFIED.   Past Medical History:  Diagnosis Date  . Alopecia   . Arthritis   . Atrial fibrillation ([place])   . Breast cancer ([place])    double mastectomy 2011  . Clavicle fracture    right clavicle-hardware retained  . Clotting disorder ([place])    with CVA- 2013, 2014  . Cough    PERSISTANT COUGH X 20 YRS  . Difficulty sleeping    takes Ambien  . Disorders of bilirubin excretion   . Diverticulosis   . Environmental and seasonal allergies    "dry cough", related to ? allergies  . GERD (gastroesophageal reflux disease)   . Hyperlipidemia   . MVP (mitral valve prolapse)   . Osteopenia   . Osteoporosis   . Stroke [facility]) 06/07/11  x2 episodes-"funny feeling"" equilibrium off x24 hours, x1 speech affected- no residuallast 1 yr ago.  . Tubulovillous adenoma of colon 08/2003   Past Surgical History:  Procedure Laterality Date  . ABDOMINAL HYSTERECTOMY  1993  . BREAST CAPSULOTOMY     after bilat mastectomies-implants removed  . BREAST ENHANCEMENT SURGERY     x3-then removed 2011 for bilat mastectomies  . BREAST SURGERY  june 2011    double mastectomy nodes from rt  . CHEST WALL TUMOR EXCISION  5/12   noduals removed-neg  . collarbone  september 2011   pinned and plated, right  . COLONOSCOPY    . JOINT REPLACEMENT  july 9th, 2012   left knee replacement , Right knee replacement 09-13-17 Dr. Alvan Dame  . KNEE RECONSTRUCTION  09/2010   revision lt total knee   left  . LOOP RECORDER IMPLANT N/A 11/07/2013   Procedure: LOOP RECORDER IMPLANT;  Surgeon: Evans Lance, MD;  [facility] CATH LAB;  Service: Cardiovascular;  Laterality: N/A;  . LOOP RECORDER REMOVAL N/A 05/16/2017   Procedure: LOOP RECORDER REMOVAL;  Surgeon: Evans Lance, MD;  Location: [place] CV LAB;  Service: Cardiovascular;  Laterality: N/A;  . MASS EXCISION Left 06/19/2012   Procedure: EXCISION OF LARGE MASS LEFT NECK/SHOULDER WITH PLASTIC CLOSURE;  Surgeon: Cristine Polio, MD;  Location: [place];  Service: Plastics;  Laterality: Left;  . NASAL SEPTUM SURGERY    . TEE WITHOUT CARDIOVERSION N/A 11/07/2013   Procedure: TRANSESOPHAGEAL ECHOCARDIOGRAM (TEE);  Surgeon: Dorothy Spark, MD;  Location: [place];  Service: Cardiovascular;  Laterality: N/A;  . TONSILLECTOMY    . TOTAL HIP ARTHROPLASTY Right 04/10/2013   Procedure: RIGHT TOTAL HIP ARTHROPLASTY ANTERIOR APPROACH;  Surgeon: [name], MD;  [facility];  Service: Orthopedics;  Laterality: Right;  . TOTAL HIP ARTHROPLASTY Left 12/31/2014   Procedure: LEFT TOTAL HIP ARTHROPLASTY ANTERIOR APPROACH;  Surgeon: Paralee Cancel, MD;  [facility];  Service: Orthopedics;  Laterality: Left;  . TOTAL KNEE ARTHROPLASTY Right 09/13/2017   Procedure: RIGHT TOTAL KNEE ARTHROPLASTY;  Surgeon: Paralee Cancel, MD;  [facility];  Service: Orthopedics;  Laterality: Right;  70 mins  . UPPER GASTROINTESTINAL ENDOSCOPY    . VEIN SURGERY  1974   left leg   Social History: She lives alone, daughter lives nearby. Nonsmoker. No alcohol or drug use.   Family History:  Father with history of  stomach ulcers, died from an aortic aneurysm age 26. Mother died from old age at 5. Maternal aunt with history of breast cancer. No family history of esophageal, gastric or colon cancer.   Allergies  Allergen Reactions  . Sulfonamide Derivatives Rash  . Sulfacetamide Sodium-Sulfur Rash      Outpatient Encounter Medications as of 04/08/2020  Medication Sig  . acetaminophen (TYLENOL) 500 MG tablet Take 1,000 mg by mouth as needed for mild pain.  Marland Kitchen alendronate (FOSAMAX) 70 MG tablet Take 70 mg by mouth every Sunday.   Marland Kitchen atorvastatin (LIPITOR) 10 MG tablet Take 10 mg by mouth daily.  Marland Kitchen BIOTIN PO Take 1 tablet by mouth daily.  . Calcium Carb-Cholecalciferol (CALCIUM 600/VITAMIN D3 PO) Take 1 tablet by mouth daily.  . cholecalciferol (VITAMIN D) 1000 UNITS tablet Take 1,000 Units by mouth daily.  . Multiple Vitamin (MULTIVITAMIN) capsule Take 1 capsule by mouth daily.  . Omega-3 Fatty Acids (FISH OIL PO) Take 1 capsule by mouth 2 (two) times daily.  [place] Calcium 500 MG TABS  1 tablet  . polyvinyl alcohol (LIQUIFILM TEARS) 1.4 % ophthalmic solution Place 1 drop into both eyes as needed for dry eyes.   . predniSONE (DELTASONE) 5 MG tablet Take 4 mg by mouth daily.  . rivaroxaban (XARELTO) 20 MG TABS tablet Take 1 tablet (20 mg total) by mouth daily with supper.  . Sodium Sulfate-Mag Sulfate-KCl (SUTAB) [phone] MG TABS Take 1 kit by mouth as directed.  . vitamin C (ASCORBIC ACID) 500 MG tablet Take 500 mg by mouth daily as needed (for cold symptoms).   . zolpidem (AMBIEN) 10 MG tablet Take 10 mg by mouth at bedtime as needed for sleep.   . [DISCONTINUED] atorvastatin (LIPITOR) 10 MG tablet 1 tablet  . [DISCONTINUED] ipratropium (ATROVENT) 0.06 % nasal spray as needed.   No facility-administered encounter medications on file as of 04/08/2020.     REVIEW OF SYSTEMS: Gen: Denies fever, sweats or chills. No weight loss.  CV: Denies chest pain, palpitations or edema. Resp: Denies  cough, shortness of breath of hemoptysis.  GI: See HPI. GU : Denies urinary burning, blood in urine, increased urinary frequency or incontinence. MS: + arthritis.  Derm: Denies rash, itchiness, skin lesions or unhealing ulcers. Psych: Denies depression, anxiety or memory loss.  Heme: Denies bruising, bleeding. Neuro:  Denies headaches, dizziness or paresthesias. Endo:  Denies any problems with DM, thyroid or adrenal function.  PHYSICAL EXAM: BP 100/62   Pulse 76   Ht '5\' 5"'  (1.651 m)   Wt 136 lb (61.7 kg)   BMI 22.63 kg/m  General: 80 year old female in no acute distress. Head: Normocephalic and atraumatic. Eyes:  Sclerae non-icteric, conjunctive pink. Ears: Normal auditory acuity. Mouth: Dentition intact. No ulcers or lesions.  Neck: Supple, no lymphadenopathy or thyromegaly.  Lungs: Clear bilaterally to auscultation without wheezes, crackles or rhonchi. Heart: Regular rate and rhythm. No murmur, rub or gallop appreciated.  Abdomen: Soft, nontender, non distended. No masses. No hepatosplenomegaly. Normoactive bowel sounds x 4 quadrants.  Rectal: Deferred. Musculoskeletal: Symmetrical with no gross deformities. Skin: Warm and dry. No rash or lesions on visible extremities. Extremities: No edema. Neurological: Alert oriented x 4, no focal deficits.  Psychological:  Alert and cooperative. Normal mood and affect.  ASSESSMENT AND PLAN:  18. 80 year old female with a history of colon polyps including tubulovillous adenomatous polyps. See Colonoscopy 2017 and flexible sigmoidoscopy 2018 reports above.  -Colonoscopy benefits and risks discussed including risk with sedation, risk of bleeding, perforation and infection  -Hopefully this will be her last colonoscopy.  -Our office will contact cardiologist Dr. Cristopher Peru to verify Xarelto instructions prior to proceeding with a colonoscopy -Further follow-up to be determined after colonoscopy completed  2. History of atrial fibrillation  on Xarelto.   3. History of CVA x 2  4. History of breast cancer 2011 s/p bilateral mastectomies without recurrence.         CC:  Deland Pretty, MD

## 2020-04-08 NOTE — Telephone Encounter (Signed)
   Primary Cardiologist: Cristopher Peru, MD  Chart reviewed as part of pre-operative protocol coverage.  Patient with diagnosis of afib on Xarelto for anticoagulation.    Procedure: colonoscopy Date of procedure: 04/23/20  CHA2DS2-VASc Score = 5  This indicates a 7.2% annual risk of stroke. The patient's score is based upon: CHF History: No HTN History: No Diabetes History: No Stroke History: Yes Vascular Disease History: No Age Score: 2 Gender Score: 1  CrCl 62mL/min Platelet count 298K  Should not require 3-5 day hold for Xarelto. Typically hold 1-2 days prior to a colonoscopy. In this case due to afib-related stroke, would recommend only holding Xarelto for 1 day prior to colonoscopy. If 2 day hold is required, will need MD input.   I will route this recommendation to the requesting party via Epic fax function and remove from pre-op pool.  Please call with questions.  Jossie Ng. Natalija Mavis NP-C    04/08/2020, 4:26 PM [place] [address] Office [phone] Fax [phone]

## 2020-04-08 NOTE — Patient Instructions (Signed)
If you are age 80 or older, your body mass index should be between 23-30. Your Body mass index is 22.63 kg/m. If this is out of the aforementioned range listed, please consider follow up with your Primary Care Provider.  If you are age 39 or younger, your body mass index should be between 19-25. Your Body mass index is 22.63 kg/m. If this is out of the aformentioned range listed, please consider follow up with your Primary Care Provider.   You will be contacted by our office prior to your procedure for directions on holding your Xarelto.  If you do not hear from our office 1 week prior to your scheduled procedure, please call [phone] to discuss.   You have been scheduled for a colonoscopy. Please follow written instructions given to you at your visit today.  Please pick up your prep supplies at the pharmacy within the next 1-3 days. If you use inhalers (even only as needed), please bring them with you on the day of your procedure.  It was great seeing you today!  Thank you for entrusting me with your care and choosing [facility].  Noralyn Pick, CRNP

## 2020-04-08 NOTE — Telephone Encounter (Signed)
Request for surgical clearance:     Endoscopy Procedure  What type of surgery is being performed?     Colonoscopy  When is this surgery scheduled?     04/23/20  What type of clearance is required ?   Pharmacy  Are there any medications that need to be held prior to surgery and how long? Xarelto 3-5 day  Practice name and name of physician performing surgery?      [facility]  What is your office phone and fax number?      Phone- [phone]  [phone]  Anesthesia type (None, local, MAC, general) ?       MAC

## 2020-04-08 NOTE — Telephone Encounter (Signed)
Patient with diagnosis of afib on Xarelto for anticoagulation.    Procedure: colonoscopy Date of procedure: 04/23/20  CHA2DS2-VASc Score = 5  This indicates a 7.2% annual risk of stroke. The patient's score is based upon: CHF History: No HTN History: No Diabetes History: No Stroke History: Yes Vascular Disease History: No Age Score: 2 Gender Score: 1  CrCl 49mL/min Platelet count 298K  Should not require 3-5 day hold for Xarelto. Typically hold 1-2 days prior to a colonoscopy. In this case due to afib-related stroke, would recommend only holding Xarelto for 1 day prior to colonoscopy. If 2 day hold is required, will need MD input.

## 2020-04-09 NOTE — Progress Notes (Signed)
Reviewed and agree with management plan.  Daphene Chisholm T. Jeneen Doutt, MD FACG [phone]  

## 2020-04-14 NOTE — Telephone Encounter (Signed)
Notified patient of message below to hold for 1 day , patient expressed understanding and agreement, no further questions at this time.

## 2020-04-23 ENCOUNTER — Other Ambulatory Visit: Payer: Self-pay

## 2020-04-23 ENCOUNTER — Encounter: Payer: Self-pay | Admitting: Gastroenterology

## 2020-04-23 ENCOUNTER — Ambulatory Visit (AMBULATORY_SURGERY_CENTER): Payer: Medicare Other | Admitting: Gastroenterology

## 2020-04-23 VITALS — BP 144/71 | HR 61 | Temp 98.4°F | Resp 19 | Ht 65.0 in | Wt 136.0 lb

## 2020-04-23 DIAGNOSIS — D122 Benign neoplasm of ascending colon: Secondary | ICD-10-CM | POA: Diagnosis not present

## 2020-04-23 DIAGNOSIS — Z860100 Personal history of colon polyps, unspecified: Secondary | ICD-10-CM

## 2020-04-23 DIAGNOSIS — D123 Benign neoplasm of transverse colon: Secondary | ICD-10-CM

## 2020-04-23 MED ORDER — SODIUM CHLORIDE 0.9 % IV SOLN: 500.0000 mL | Freq: Once | INTRAVENOUS | Status: DC

## 2020-04-23 NOTE — Patient Instructions (Signed)
Resume Xarelto in 3 days at prior dose.  No aspirin, ibuprofen, naproxen, or other non-steroidal anti-inflammatory drugs for 2 weeks after polyp removed.  Handout on polyps, diverticulosis and hemorrhoids given.    YOU HAD AN ENDOSCOPIC PROCEDURE TODAY AT [facility]:   Refer to the procedure report that was given to you for any specific questions about what was found during the examination.  If the procedure report does not answer your questions, please call your gastroenterologist to clarify.  If you requested that your care partner not be given the details of your procedure findings, then the procedure report has been included in a sealed envelope for you to review at your convenience later.  YOU SHOULD EXPECT: Some feelings of bloating in the abdomen. Passage of more gas than usual.  Walking can help get rid of the air that was put into your GI tract during the procedure and reduce the bloating. If you had a lower endoscopy (such as a colonoscopy or flexible sigmoidoscopy) you may notice spotting of blood in your stool or on the toilet paper. If you underwent a bowel prep for your procedure, you may not have a normal bowel movement for a few days.  Please Note:  You might notice some irritation and congestion in your nose or some drainage.  This is from the oxygen used during your procedure.  There is no need for concern and it should clear up in a day or so.  SYMPTOMS TO REPORT IMMEDIATELY:   Following lower endoscopy (colonoscopy or flexible sigmoidoscopy):  Excessive amounts of blood in the stool  Significant tenderness or worsening of abdominal pains  Swelling of the abdomen that is new, acute  Fever of 100F or higher   For urgent or emergent issues, a gastroenterologist can be reached at any hour by calling [phone]. Do not use MyChart messaging for urgent concerns.    DIET:  We do recommend a small meal at first, but then you may proceed to your regular diet.   Drink plenty of fluids but you should avoid alcoholic beverages for 24 hours.  ACTIVITY:  You should plan to take it easy for the rest of today and you should NOT DRIVE or use heavy machinery until tomorrow (because of the sedation medicines used during the test).    FOLLOW UP: Our staff will call the number listed on your records 48-72 hours following your procedure to check on you and address any questions or concerns that you may have regarding the information given to you following your procedure. If we do not reach you, we will leave a message.  We will attempt to reach you two times.  During this call, we will ask if you have developed any symptoms of COVID 19. If you develop any symptoms (ie: fever, flu-like symptoms, shortness of breath, cough etc.) before then, please call [phone].  If you test positive for Covid 19 in the 2 weeks post procedure, please call and report this information to [place].    If any biopsies were taken you will be contacted by phone or by letter within the next 1-3 weeks.  Please call us at [phone] if you have not heard about the biopsies in 3 weeks.    SIGNATURES/CONFIDENTIALITY: You and/or your care partner have signed paperwork which will be entered into your electronic medical record.  These signatures attest to the fact that that the information above on your After Visit Summary has been reviewed and is  understood.  Full responsibility of the confidentiality of this discharge information lies with you and/or your care-partner. 

## 2020-04-23 NOTE — Op Note (Signed)
[facility] Patient Name: Felicia Hunter Procedure Date: 04/23/2020 1:25 PM MRN: [id number] Endoscopist: Ladene Artist , MD Age: 80 Referring MD:  Date of Birth: 22-Nov-1940 Gender: Female Account #: [id number] Procedure:                Colonoscopy Indications:              Surveillance: Personal history of adenomatous                            polyps on last colonoscopy > 3 years ago Medicines:                Monitored Anesthesia Care Procedure:                Pre-Anesthesia Assessment:                           - Prior to the procedure, a History and Physical                            was performed, and patient medications and                            allergies were reviewed. The patient's tolerance of                            previous anesthesia was also reviewed. The risks                            and benefits of the procedure and the sedation                            options and risks were discussed with the patient.                            All questions were answered, and informed consent                            was obtained. Prior Anticoagulants: The patient has                            taken Xarelto (rivaroxaban), last dose was 2 days                            prior to procedure. ASA Grade Assessment: III - A                            patient with severe systemic disease. After                            reviewing the risks and benefits, the patient was                            deemed in satisfactory condition to undergo the  procedure.                           After obtaining informed consent, the colonoscope                            was passed under direct vision. Throughout the                            procedure, the patient's blood pressure, pulse, and                            oxygen saturations were monitored continuously. The                            Olympus PFC-[id number] (#[id number]) Colonoscope was                             introduced through the anus and advanced to the the                            cecum, identified by appendiceal orifice and                            ileocecal valve. The ileocecal valve, appendiceal                            orifice, and rectum were photographed. The quality                            of the bowel preparation was excellent. The                            colonoscopy was performed without difficulty. The                            patient tolerated the procedure well. Scope In: 1:31:12 PM Scope Out: 1:53:09 PM Scope Withdrawal Time: 0 hours 16 minutes 34 seconds  Total Procedure Duration: 0 hours 21 minutes 57 seconds  Findings:                 The perianal and digital rectal examinations were                            normal.                           Two sessile polyps were found in the ascending                            colon. The polyps were 6 to 7 mm in size. These                            polyps were removed with a cold snare. Resection  and retrieval were complete.                           A 5 mm polyp was found in the transverse colon. The                            polyp was sessile. The polyp was removed with a                            cold biopsy forceps. Resection and retrieval were                            complete.                           Scattered small-mouthed diverticula were found in                            the entire colon, more concentrated in the left                            colon. There was no evidence of diverticular                            bleeding.                           A medium post polypectomy scar was found in the                            distal rectum.                           Internal hemorrhoids were found during                            retroflexion. The hemorrhoids were small and Grade                            I (internal hemorrhoids that do not prolapse).                            The exam was otherwise without abnormality on                            direct and retroflexion views. Complications:            No immediate complications. Estimated blood loss:                            None. Estimated Blood Loss:     Estimated blood loss: none. Impression:               - Two 6 to 7 mm polyps in the ascending colon,  removed with a cold snare. Resected and retrieved.                           - One 5 mm polyp in the transverse colon, removed                            with a cold biopsy forceps. Resected and retrieved.                           - Mild diverticulosis in the entire examined colon.                           - Post-polypectomy scar in the distal rectum.                           - Internal hemorrhoids. Recommendation:           - Repeat colonoscopy, vs no repeat surveillance                            colonoscopy due to age, after studies are complete                            for surveillance based on pathology results.                           - Resume Xarelto (rivaroxaban) in 3 days at prior                            dose. Refer to managing physician for further                            adjustment of therapy.                           - Patient has a contact number available for                            emergencies. The signs and symptoms of potential                            delayed complications were discussed with the                            patient. Return to normal activities tomorrow.                            Written discharge instructions were provided to the                            patient.                           - Resume previous diet.                           -  Continue present medications.                           - Await pathology results.                           - No aspirin, ibuprofen, naproxen, or other                            non-steroidal anti-inflammatory drugs  for 2 weeks                            after polyp removal. Ladene Artist, MD 04/23/2020 1:58:36 PM This report has been signed electronically.

## 2020-04-23 NOTE — Progress Notes (Signed)
To PACU, VSS. Report to [email or web address].tb 

## 2020-04-23 NOTE — Progress Notes (Addendum)
AR - Check-in  CW - VS  Pt has a metal plate right Clavical, Bilateral hip replacements, Bilateral knee replacements.  Gershon Crane, CRNA was advised. maw

## 2020-04-23 NOTE — Progress Notes (Signed)
Called to room to assist during endoscopic procedure.  Patient ID and intended procedure confirmed with present staff. Received instructions for my participation in the procedure from the performing physician.  

## 2020-04-24 ENCOUNTER — Emergency Department (HOSPITAL_COMMUNITY): Payer: Medicare Other

## 2020-04-24 ENCOUNTER — Encounter (HOSPITAL_COMMUNITY)
Admission: EM | Disposition: A | Discharge status: Home / Self Care | Payer: Self-pay | Source: Home / Self Care | Attending: Neurology

## 2020-04-24 ENCOUNTER — Other Ambulatory Visit: Payer: Self-pay

## 2020-04-24 ENCOUNTER — Telehealth: Payer: Self-pay | Admitting: Adult Health

## 2020-04-24 ENCOUNTER — Emergency Department (HOSPITAL_COMMUNITY): Payer: Medicare Other | Admitting: Certified Registered Nurse Anesthetist

## 2020-04-24 ENCOUNTER — Encounter (HOSPITAL_COMMUNITY): Payer: Self-pay | Admitting: Radiology

## 2020-04-24 ENCOUNTER — Inpatient Hospital Stay (HOSPITAL_COMMUNITY)
Admission: EM | Admit: 2020-04-24 | Discharge: 2020-04-26 | DRG: 024 | Disposition: A | Discharge status: Home / Self Care | Payer: Medicare Other | Attending: Neurology | Admitting: Neurology

## 2020-04-24 DIAGNOSIS — M1990 Unspecified osteoarthritis, unspecified site: Secondary | ICD-10-CM | POA: Diagnosis present

## 2020-04-24 DIAGNOSIS — Z20822 Contact with and (suspected) exposure to covid-19: Secondary | ICD-10-CM | POA: Diagnosis present

## 2020-04-24 DIAGNOSIS — I6310 Cerebral infarction due to embolism of unspecified precerebral artery: Secondary | ICD-10-CM | POA: Diagnosis not present

## 2020-04-24 DIAGNOSIS — I480 Paroxysmal atrial fibrillation: Secondary | ICD-10-CM | POA: Diagnosis present

## 2020-04-24 DIAGNOSIS — R29701 NIHSS score 1: Secondary | ICD-10-CM | POA: Diagnosis not present

## 2020-04-24 DIAGNOSIS — R29703 NIHSS score 3: Secondary | ICD-10-CM | POA: Diagnosis not present

## 2020-04-24 DIAGNOSIS — R29702 NIHSS score 2: Secondary | ICD-10-CM | POA: Diagnosis not present

## 2020-04-24 DIAGNOSIS — R471 Dysarthria and anarthria: Secondary | ICD-10-CM | POA: Diagnosis not present

## 2020-04-24 DIAGNOSIS — I6389 Other cerebral infarction: Secondary | ICD-10-CM | POA: Diagnosis not present

## 2020-04-24 DIAGNOSIS — I4891 Unspecified atrial fibrillation: Secondary | ICD-10-CM | POA: Diagnosis not present

## 2020-04-24 DIAGNOSIS — R414 Neurologic neglect syndrome: Secondary | ICD-10-CM | POA: Diagnosis present

## 2020-04-24 DIAGNOSIS — Z7952 Long term (current) use of systemic steroids: Secondary | ICD-10-CM

## 2020-04-24 DIAGNOSIS — R29810 Facial weakness: Secondary | ICD-10-CM | POA: Diagnosis present

## 2020-04-24 DIAGNOSIS — Z853 Personal history of malignant neoplasm of breast: Secondary | ICD-10-CM

## 2020-04-24 DIAGNOSIS — Z8673 Personal history of transient ischemic attack (TIA), and cerebral infarction without residual deficits: Secondary | ICD-10-CM | POA: Diagnosis not present

## 2020-04-24 DIAGNOSIS — I63511 Cerebral infarction due to unspecified occlusion or stenosis of right middle cerebral artery: Secondary | ICD-10-CM | POA: Diagnosis not present

## 2020-04-24 DIAGNOSIS — K219 Gastro-esophageal reflux disease without esophagitis: Secondary | ICD-10-CM | POA: Diagnosis present

## 2020-04-24 DIAGNOSIS — I63311 Cerebral infarction due to thrombosis of right middle cerebral artery: Secondary | ICD-10-CM | POA: Diagnosis not present

## 2020-04-24 DIAGNOSIS — M810 Age-related osteoporosis without current pathological fracture: Secondary | ICD-10-CM | POA: Diagnosis present

## 2020-04-24 DIAGNOSIS — M353 Polymyalgia rheumatica: Secondary | ICD-10-CM | POA: Diagnosis present

## 2020-04-24 DIAGNOSIS — I63411 Cerebral infarction due to embolism of right middle cerebral artery: Secondary | ICD-10-CM | POA: Diagnosis not present

## 2020-04-24 DIAGNOSIS — I10 Essential (primary) hypertension: Secondary | ICD-10-CM | POA: Diagnosis not present

## 2020-04-24 DIAGNOSIS — Z9889 Other specified postprocedural states: Secondary | ICD-10-CM | POA: Diagnosis not present

## 2020-04-24 DIAGNOSIS — G8194 Hemiplegia, unspecified affecting left nondominant side: Secondary | ICD-10-CM | POA: Diagnosis not present

## 2020-04-24 DIAGNOSIS — H518 Other specified disorders of binocular movement: Secondary | ICD-10-CM | POA: Diagnosis not present

## 2020-04-24 DIAGNOSIS — I499 Cardiac arrhythmia, unspecified: Secondary | ICD-10-CM | POA: Diagnosis not present

## 2020-04-24 DIAGNOSIS — I6601 Occlusion and stenosis of right middle cerebral artery: Secondary | ICD-10-CM

## 2020-04-24 DIAGNOSIS — Z96653 Presence of artificial knee joint, bilateral: Secondary | ICD-10-CM | POA: Diagnosis present

## 2020-04-24 DIAGNOSIS — R29818 Other symptoms and signs involving the nervous system: Secondary | ICD-10-CM | POA: Diagnosis not present

## 2020-04-24 DIAGNOSIS — R29709 NIHSS score 9: Secondary | ICD-10-CM | POA: Diagnosis not present

## 2020-04-24 DIAGNOSIS — I639 Cerebral infarction, unspecified: Secondary | ICD-10-CM | POA: Diagnosis not present

## 2020-04-24 DIAGNOSIS — I491 Atrial premature depolarization: Secondary | ICD-10-CM | POA: Diagnosis not present

## 2020-04-24 DIAGNOSIS — E871 Hypo-osmolality and hyponatremia: Secondary | ICD-10-CM | POA: Diagnosis not present

## 2020-04-24 DIAGNOSIS — I959 Hypotension, unspecified: Secondary | ICD-10-CM | POA: Diagnosis not present

## 2020-04-24 DIAGNOSIS — Z9013 Acquired absence of bilateral breasts and nipples: Secondary | ICD-10-CM

## 2020-04-24 DIAGNOSIS — Z90710 Acquired absence of both cervix and uterus: Secondary | ICD-10-CM | POA: Diagnosis not present

## 2020-04-24 DIAGNOSIS — Z79899 Other long term (current) drug therapy: Secondary | ICD-10-CM

## 2020-04-24 DIAGNOSIS — E785 Hyperlipidemia, unspecified: Secondary | ICD-10-CM | POA: Diagnosis present

## 2020-04-24 DIAGNOSIS — Z882 Allergy status to sulfonamides status: Secondary | ICD-10-CM

## 2020-04-24 DIAGNOSIS — Z96643 Presence of artificial hip joint, bilateral: Secondary | ICD-10-CM | POA: Diagnosis present

## 2020-04-24 DIAGNOSIS — Z7901 Long term (current) use of anticoagulants: Secondary | ICD-10-CM

## 2020-04-24 DIAGNOSIS — R0902 Hypoxemia: Secondary | ICD-10-CM | POA: Diagnosis not present

## 2020-04-24 HISTORY — PX: IR PERCUTANEOUS ART THROMBECTOMY/INFUSION INTRACRANIAL INC DIAG ANGIO: IMG6087

## 2020-04-24 HISTORY — PX: IR ANGIO EXTRACRAN SEL COM CAROTID INNOMINATE UNI R MOD SED: IMG5356

## 2020-04-24 HISTORY — PX: RADIOLOGY WITH ANESTHESIA: SHX6223

## 2020-04-24 HISTORY — PX: IR CT HEAD LTD: IMG2386

## 2020-04-24 LAB — PROTIME-INR
INR: 1 (ref 0.8–1.2)
Prothrombin Time: 13.2 seconds (ref 11.4–15.2)

## 2020-04-24 LAB — I-STAT CHEM 8, ED
BUN: 15 mg/dL (ref 8–23)
Calcium, Ion: 1.1 mmol/L — ABNORMAL LOW (ref 1.15–1.40)
Chloride: 104 mmol/L (ref 98–111)
Creatinine, Ser: 0.8 mg/dL (ref 0.44–1.00)
Glucose, Bld: 166 mg/dL — ABNORMAL HIGH (ref 70–99)
HCT: 38 % (ref 36.0–46.0)
Hemoglobin: 12.9 g/dL (ref 12.0–15.0)
Potassium: 3.7 mmol/L (ref 3.5–5.1)
Sodium: 139 mmol/L (ref 135–145)
TCO2: 22 mmol/L (ref 22–32)

## 2020-04-24 LAB — DIFFERENTIAL
Abs Immature Granulocytes: 0.04 10*3/uL (ref 0.00–0.07)
Basophils Absolute: 0 10*3/uL (ref 0.0–0.1)
Basophils Relative: 0 %
Eosinophils Absolute: 0 10*3/uL (ref 0.0–0.5)
Eosinophils Relative: 0 %
Immature Granulocytes: 0 %
Lymphocytes Relative: 8 %
Lymphs Abs: 0.8 10*3/uL (ref 0.7–4.0)
Monocytes Absolute: 0.4 10*3/uL (ref 0.1–1.0)
Monocytes Relative: 4 %
Neutro Abs: 8.5 10*3/uL — ABNORMAL HIGH (ref 1.7–7.7)
Neutrophils Relative %: 88 %

## 2020-04-24 LAB — RAPID URINE DRUG SCREEN, HOSP PERFORMED
Amphetamines: NOT DETECTED
Barbiturates: NOT DETECTED
Benzodiazepines: NOT DETECTED
Cocaine: NOT DETECTED
Opiates: NOT DETECTED
Tetrahydrocannabinol: NOT DETECTED

## 2020-04-24 LAB — COMPREHENSIVE METABOLIC PANEL
ALT: 16 U/L (ref 0–44)
AST: 23 U/L (ref 15–41)
Albumin: 3.7 g/dL (ref 3.5–5.0)
Alkaline Phosphatase: 40 U/L (ref 38–126)
Anion gap: 10 (ref 5–15)
BUN: 14 mg/dL (ref 8–23)
CO2: 24 mmol/L (ref 22–32)
Calcium: 9 mg/dL (ref 8.9–10.3)
Chloride: 106 mmol/L (ref 98–111)
Creatinine, Ser: 0.94 mg/dL (ref 0.44–1.00)
GFR, Estimated: >60 mL/min (ref >60)
Glucose, Bld: 165 mg/dL — ABNORMAL HIGH (ref 70–99)
Potassium: 3.8 mmol/L (ref 3.5–5.1)
Sodium: 140 mmol/L (ref 135–145)
Total Bilirubin: 1.7 mg/dL — ABNORMAL HIGH (ref 0.3–1.2)
Total Protein: 6.3 g/dL — ABNORMAL LOW (ref 6.5–8.1)

## 2020-04-24 LAB — CBC
HCT: 42.2 % (ref 36.0–46.0)
Hemoglobin: 13.1 g/dL (ref 12.0–15.0)
MCH: 30.4 pg (ref 26.0–34.0)
MCHC: 31 g/dL (ref 30.0–36.0)
MCV: 97.9 fL (ref 80.0–100.0)
Platelets: 288 10*3/uL (ref 150–400)
RBC: 4.31 MIL/uL (ref 3.87–5.11)
RDW: 13.2 % (ref 11.5–15.5)
WBC: 9.8 10*3/uL (ref 4.0–10.5)
nRBC: 0 % (ref 0.0–0.2)

## 2020-04-24 LAB — APTT: aPTT: 27 seconds (ref 24–36)

## 2020-04-24 LAB — RESP PANEL BY RT-PCR (FLU A&B, COVID) ARPGX2
Influenza A by PCR: NEGATIVE
Influenza B by PCR: NEGATIVE
SARS Coronavirus 2 by RT PCR: NEGATIVE

## 2020-04-24 LAB — CBG MONITORING, ED: Glucose-Capillary: 151 mg/dL — ABNORMAL HIGH (ref 70–99)

## 2020-04-24 LAB — MRSA PCR SCREENING: MRSA by PCR: NEGATIVE

## 2020-04-24 SURGERY — IR WITH ANESTHESIA
Anesthesia: General

## 2020-04-24 MED ORDER — TICAGRELOR 90 MG PO TABS
ORAL_TABLET | ORAL | Status: AC
  Filled 2020-04-24: qty 2

## 2020-04-24 MED ORDER — POLYVINYL ALCOHOL 1.4 % OP SOLN
1.0000 [drp] | OPHTHALMIC | Status: DC | PRN
  Filled 2020-04-24: qty 15

## 2020-04-24 MED ORDER — EPTIFIBATIDE 20 MG/10ML IV SOLN
INTRAVENOUS | Status: AC
  Filled 2020-04-24: qty 10

## 2020-04-24 MED ORDER — CLEVIDIPINE BUTYRATE 0.5 MG/ML IV EMUL
0.0000 mg/h | INTRAVENOUS | Status: DC
  Administered 2020-04-24: 2 mg/h via INTRAVENOUS

## 2020-04-24 MED ORDER — ACETAMINOPHEN 160 MG/5ML PO SOLN: 650.0000 mg | ORAL | Status: DC | PRN

## 2020-04-24 MED ORDER — DEXAMETHASONE SODIUM PHOSPHATE 10 MG/ML IJ SOLN
INTRAMUSCULAR | Status: DC | PRN
  Administered 2020-04-24: 10 mg via INTRAVENOUS

## 2020-04-24 MED ORDER — ACETAMINOPHEN 650 MG RE SUPP: 650.0000 mg | RECTAL | Status: DC | PRN

## 2020-04-24 MED ORDER — SODIUM CHLORIDE 0.9% FLUSH
3.0000 mL | Freq: Once | INTRAVENOUS | Status: DC
Start: 2020-04-24 — End: 2020-04-26

## 2020-04-24 MED ORDER — NITROGLYCERIN 1 MG/10 ML FOR IR/CATH LAB
INTRA_ARTERIAL | Status: AC
  Filled 2020-04-24: qty 10

## 2020-04-24 MED ORDER — PROPOFOL 10 MG/ML IV BOLUS
INTRAVENOUS | Status: DC | PRN
  Administered 2020-04-24: 100 mg via INTRAVENOUS

## 2020-04-24 MED ORDER — VERAPAMIL HCL 2.5 MG/ML IV SOLN
INTRAVENOUS | Status: AC
  Filled 2020-04-24: qty 2

## 2020-04-24 MED ORDER — ATORVASTATIN CALCIUM 10 MG PO TABS
10.0000 mg | ORAL_TABLET | Freq: Every day | ORAL | Status: DC
  Administered 2020-04-25 – 2020-04-26 (×2): 10 mg via ORAL
  Filled 2020-04-24 (×2): qty 1

## 2020-04-24 MED ORDER — CLOPIDOGREL BISULFATE 300 MG PO TABS
ORAL_TABLET | ORAL | Status: AC
  Filled 2020-04-24: qty 1

## 2020-04-24 MED ORDER — PHENYLEPHRINE 40 MCG/ML (10ML) SYRINGE FOR IV PUSH (FOR BLOOD PRESSURE SUPPORT)
PREFILLED_SYRINGE | INTRAVENOUS | Status: DC | PRN
  Administered 2020-04-24 (×3): 40 ug via INTRAVENOUS

## 2020-04-24 MED ORDER — STROKE: EARLY STAGES OF RECOVERY BOOK
Freq: Once | Status: AC
  Filled 2020-04-24: qty 1

## 2020-04-24 MED ORDER — SENNOSIDES-DOCUSATE SODIUM 8.6-50 MG PO TABS: 1.0000 | ORAL_TABLET | Freq: Every evening | ORAL | Status: DC | PRN

## 2020-04-24 MED ORDER — SUCCINYLCHOLINE CHLORIDE 200 MG/10ML IV SOSY
PREFILLED_SYRINGE | INTRAVENOUS | Status: DC | PRN
  Administered 2020-04-24: 80 mg via INTRAVENOUS

## 2020-04-24 MED ORDER — LIDOCAINE 2% (20 MG/ML) 5 ML SYRINGE
INTRAMUSCULAR | Status: DC | PRN
  Administered 2020-04-24: 50 mg via INTRAVENOUS

## 2020-04-24 MED ORDER — TIROFIBAN HCL IN NACL 5-0.9 MG/100ML-% IV SOLN
INTRAVENOUS | Status: AC
  Filled 2020-04-24: qty 100

## 2020-04-24 MED ORDER — ROCURONIUM BROMIDE 10 MG/ML (PF) SYRINGE
PREFILLED_SYRINGE | INTRAVENOUS | Status: DC | PRN
  Administered 2020-04-24: 60 mg via INTRAVENOUS

## 2020-04-24 MED ORDER — EPHEDRINE SULFATE-NACL 50-0.9 MG/10ML-% IV SOSY
PREFILLED_SYRINGE | INTRAVENOUS | Status: DC | PRN
  Administered 2020-04-24: 5 mg via INTRAVENOUS

## 2020-04-24 MED ORDER — IOHEXOL 300 MG/ML  SOLN
50.0000 mL | Freq: Once | INTRAMUSCULAR | Status: AC | PRN
  Administered 2020-04-24: 25 mL via INTRA_ARTERIAL

## 2020-04-24 MED ORDER — NITROGLYCERIN 1 MG/10 ML FOR IR/CATH LAB
INTRA_ARTERIAL | Status: AC | PRN
  Administered 2020-04-24 (×2): 25 ug via INTRA_ARTERIAL

## 2020-04-24 MED ORDER — PHENYLEPHRINE HCL-NACL 10-0.9 MG/250ML-% IV SOLN
INTRAVENOUS | Status: DC | PRN
  Administered 2020-04-24: 20 ug/min via INTRAVENOUS

## 2020-04-24 MED ORDER — ACETAMINOPHEN 325 MG PO TABS: 650.0000 mg | ORAL_TABLET | ORAL | Status: DC | PRN

## 2020-04-24 MED ORDER — ONDANSETRON HCL 4 MG/2ML IJ SOLN
INTRAMUSCULAR | Status: DC | PRN
  Administered 2020-04-24: 4 mg via INTRAVENOUS

## 2020-04-24 MED ORDER — CANGRELOR TETRASODIUM 50 MG IV SOLR
INTRAVENOUS | Status: AC
  Filled 2020-04-24: qty 50

## 2020-04-24 MED ORDER — CLEVIDIPINE BUTYRATE 0.5 MG/ML IV EMUL
INTRAVENOUS | Status: AC
  Filled 2020-04-24: qty 50

## 2020-04-24 MED ORDER — PREDNISONE 1 MG PO TABS
4.0000 mg | ORAL_TABLET | Freq: Every day | ORAL | Status: DC
Start: 2020-04-24 — End: 2020-04-26
  Administered 2020-04-25 – 2020-04-26 (×2): 4 mg via ORAL
  Filled 2020-04-24 (×3): qty 4

## 2020-04-24 MED ORDER — CHLORHEXIDINE GLUCONATE CLOTH 2 % EX PADS
6.0000 | MEDICATED_PAD | Freq: Every day | CUTANEOUS | Status: DC
  Administered 2020-04-24 – 2020-04-25 (×2): 6 via TOPICAL

## 2020-04-24 MED ORDER — SODIUM CHLORIDE 0.9 % IV SOLN: INTRAVENOUS | Status: DC | PRN

## 2020-04-24 MED ORDER — CEFAZOLIN SODIUM-DEXTROSE 2-4 GM/100ML-% IV SOLN
INTRAVENOUS | Status: AC
  Filled 2020-04-24: qty 100

## 2020-04-24 MED ORDER — SODIUM CHLORIDE 0.9 % IV SOLN: INTRAVENOUS | Status: DC

## 2020-04-24 MED ORDER — FENTANYL CITRATE (PF) 100 MCG/2ML IJ SOLN
INTRAMUSCULAR | Status: AC
  Filled 2020-04-24: qty 2

## 2020-04-24 MED ORDER — ASPIRIN 81 MG PO CHEW
CHEWABLE_TABLET | ORAL | Status: AC
  Filled 2020-04-24: qty 1

## 2020-04-24 MED ORDER — SUGAMMADEX SODIUM 200 MG/2ML IV SOLN
INTRAVENOUS | Status: DC | PRN
  Administered 2020-04-24: 130 mg via INTRAVENOUS

## 2020-04-24 MED ORDER — IOHEXOL 240 MG/ML SOLN
INTRAMUSCULAR | Status: AC
  Filled 2020-04-24: qty 200

## 2020-04-24 MED ORDER — FENTANYL CITRATE (PF) 250 MCG/5ML IJ SOLN
INTRAMUSCULAR | Status: DC | PRN
  Administered 2020-04-24 (×4): 25 ug via INTRAVENOUS

## 2020-04-24 MED ORDER — IOHEXOL 300 MG/ML  SOLN
150.0000 mL | Freq: Once | INTRAMUSCULAR | Status: AC | PRN
  Administered 2020-04-24: 75 mL via INTRA_ARTERIAL

## 2020-04-24 MED ORDER — ORAL CARE MOUTH RINSE
15.0000 mL | Freq: Two times a day (BID) | OROMUCOSAL | Status: DC
  Administered 2020-04-24 – 2020-04-25 (×2): 15 mL via OROMUCOSAL

## 2020-04-24 MED ORDER — CEFAZOLIN SODIUM-DEXTROSE 2-3 GM-%(50ML) IV SOLR
INTRAVENOUS | Status: DC | PRN
Start: 2020-04-24 — End: 2020-04-24
  Administered 2020-04-24: 2 g via INTRAVENOUS

## 2020-04-24 MED ORDER — LABETALOL HCL 5 MG/ML IV SOLN
INTRAVENOUS | Status: DC | PRN
  Administered 2020-04-24 (×2): 10 mg via INTRAVENOUS

## 2020-04-24 MED ORDER — IOHEXOL 350 MG/ML SOLN
60.0000 mL | Freq: Once | INTRAVENOUS | Status: AC | PRN
  Administered 2020-04-24: 60 mL via INTRAVENOUS

## 2020-04-24 NOTE — Progress Notes (Signed)
Pt noted to have constant rhythmic movements of left hand and foot. When brought to her attention, patient would stop moving. Contacted Dr. Estanislado Pandy who delegated to Dr. Cheral Marker. Paged Dr. Cheral Marker and alerted stroke RN Judson Roch) who will follow up.

## 2020-04-24 NOTE — ED Triage Notes (Signed)
Patient BIB [facility] from home. CODE STROKE. Patient was cutting husbands hair and suddenly went weak on left side. VSS.

## 2020-04-24 NOTE — Sedation Documentation (Signed)
Arrival to [facility], patient transported with CRNA. PACU RN Lovena Le at the bedside. Patient hooked up to monitor. Groin site assessed, clean dry and intact. Soft upon palpation, no hematoma noted. No drainage noted from dressing. Intact distal pulses +2. Purewick in place. Stroke screening performed with stroke RN.

## 2020-04-24 NOTE — ED Provider Notes (Signed)
[place] EMERGENCY DEPARTMENT Provider Note   CSN: [id number] Arrival date & time: 04/24/20  1236     History CC:  Left sided weakness  Felicia Hunter is a 80 y.o. female with history of A. fib on Xarelto presented emergency department with concern for left-sided weakness.  Her last known normal was approximately 1130 this morning when the patient was cutting her husband's hair.  She reportedly then became less responsive and was neglecting and not moving her left arm or leg.  Per EMS patient also had a right-sided gaze deviation.  The patient reports she has been compliant with her Xarelto not missed any doses.  HPI     Past Medical History:  Diagnosis Date  . Allergy   . Alopecia   . Arthritis   . Atrial fibrillation ([place])   . Breast cancer ([place])    double mastectomy 2011  . Clavicle fracture    right clavicle-hardware retained  . Clotting disorder ([place])    with CVA- 2013, 2014  . Cough    PERSISTANT COUGH X 20 YRS  . Difficulty sleeping    takes Ambien  . Disorders of bilirubin excretion   . Diverticulosis   . Environmental and seasonal allergies    "dry cough", related to ? allergies  . GERD (gastroesophageal reflux disease)   . Hyperlipidemia   . MVP (mitral valve prolapse)   . Neuromuscular disorder ([place])    Polypmyalgia  Pheumatica  . Osteopenia   . Osteoporosis   . Stroke ([place]) 06/07/11   x2 episodes-"funny feeling"" equilibrium off x24 hours, x1 speech affected- no residuallast 1 yr ago.  . Tubulovillous adenoma of colon 08/2003    Patient Active Problem List   Diagnosis Date Noted  . Chronic cough 08/08/2018  . Solitary pulmonary nodule 03/30/2018  . S/P right TKA 09/13/2017  . S/P total knee replacement 09/13/2017  . Right leg pain 03/01/2016  . S/P left THA, AA 12/31/2014  . Cerebral infarction due to embolism of precerebral artery ([place]) 12/20/2013  . History of stroke 12/20/2013  . Expressive aphasia 11/05/2013  . Expected  blood loss anemia 04/11/2013  . Hyponatremia 04/11/2013  . Other and unspecified hyperlipidemia 06/15/2012  . Lacunar infarction ([place]) 06/15/2012  . CVA (cerebral infarction)-mid/inf cerebellar infarct 3/25 06/08/2011  . Near syncope 06/07/2011  . 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 DEPRESSION 10/28/2006  . ALLERGIC RHINITIS 10/28/2006  . ASTHMA 10/28/2006    Past Surgical History:  Procedure Laterality Date  . ABDOMINAL HYSTERECTOMY  1993  . BREAST CAPSULOTOMY     after bilat mastectomies-implants removed  . BREAST ENHANCEMENT SURGERY     x3-then removed 2011 for bilat mastectomies  . BREAST SURGERY  june 2011   double mastectomy nodes from rt  . CHEST WALL TUMOR EXCISION  5/12   noduals removed-neg  . collarbone  september 2011   pinned and plated, right  . COLONOSCOPY    . JOINT REPLACEMENT  july 9th, 2012   left knee replacement , Right knee replacement 09-13-17 Dr. Alvan Dame  . KNEE RECONSTRUCTION  09/2010   revision lt total knee   left  . LOOP RECORDER IMPLANT N/A 11/07/2013   Procedure: LOOP RECORDER IMPLANT;  Surgeon: Evans Lance, MD;  Location: [facility] CATH LAB;  Service: Cardiovascular;  Laterality: N/A;  . LOOP RECORDER REMOVAL N/A 05/16/2017   Procedure: LOOP RECORDER REMOVAL;  Surgeon: Evans Lance, MD;  Location: [facility] CV LAB;  Service: Cardiovascular;  Laterality: N/A;  . MASS EXCISION Left 06/19/2012   Procedure: EXCISION OF LARGE MASS LEFT NECK/SHOULDER WITH PLASTIC CLOSURE;  Surgeon: Cristine Polio, MD;  Location: [facility];  Service: Plastics;  Laterality: Left;  . NASAL SEPTUM SURGERY    . TEE WITHOUT CARDIOVERSION N/A 11/07/2013   Procedure: TRANSESOPHAGEAL ECHOCARDIOGRAM (TEE);  Surgeon: Dorothy Spark, MD;  Location: [facility];  Service: Cardiovascular;  Laterality: N/A;  . TONSILLECTOMY    . TOTAL HIP ARTHROPLASTY Right 04/10/2013   Procedure: RIGHT TOTAL HIP ARTHROPLASTY ANTERIOR APPROACH;  Surgeon:  [name], MD;  Location: [facility];  Service: Orthopedics;  Laterality: Right;  . TOTAL HIP ARTHROPLASTY Left 12/31/2014   Procedure: LEFT TOTAL HIP ARTHROPLASTY ANTERIOR APPROACH;  Surgeon: Paralee Cancel, MD;  Location: [facility];  Service: Orthopedics;  Laterality: Left;  . TOTAL KNEE ARTHROPLASTY Right 09/13/2017   Procedure: RIGHT TOTAL KNEE ARTHROPLASTY;  Surgeon: Paralee Cancel, MD;  Location: [facility];  Service: Orthopedics;  Laterality: Right;  70 mins  . UPPER GASTROINTESTINAL ENDOSCOPY    . VEIN SURGERY  1974   left leg     OB History   No obstetric history on file.     Family History  Problem Relation Age of Onset  . Aneurysm Father   . Colon cancer Neg Hx   . Rectal cancer Neg Hx   . Stomach cancer Neg Hx   . Esophageal cancer Neg Hx     Social History   Tobacco Use  . Smoking status: Never Smoker  . Smokeless tobacco: Never Used  Vaping Use  . Vaping Use: Never used  Substance Use Topics  . Alcohol use: Yes    Alcohol/week: 1.0 standard drink    Types: 1 Glasses of wine per week    Comment: 1 glass of wine per night  . Drug use: No    Home Medications Prior to Admission medications   Medication Sig Start Date End Date Taking? Authorizing Provider  acetaminophen (TYLENOL) 500 MG tablet Take 1,000 mg by mouth as needed for mild pain.    [provider]  alendronate (FOSAMAX) 70 MG tablet Take 70 mg by mouth every Sunday.  11/24/15   [provider]  atorvastatin (LIPITOR) 10 MG tablet Take 10 mg by mouth daily. 03/18/20   [provider]  BIOTIN PO Take 1 tablet by mouth daily.    [provider]  Calcium Carb-Cholecalciferol (CALCIUM 600/VITAMIN D3 PO) Take 1 tablet by mouth daily.    [provider]  cholecalciferol (VITAMIN D) 1000 UNITS tablet Take 1,000 Units by mouth daily.    [provider]  Multiple Vitamin (MULTIVITAMIN) capsule Take 1 capsule by mouth daily.    [provider]  Omega-3 Fatty  Acids (FISH OIL PO) Take 1 capsule by mouth 2 (two) times daily.    [provider]  [place] Calcium 500 MG TABS 1 tablet    [provider]  polyvinyl alcohol (LIQUIFILM TEARS) 1.4 % ophthalmic solution Place 1 drop into both eyes as needed for dry eyes.     [provider]  predniSONE (DELTASONE) 5 MG tablet Take 4 mg by mouth daily. 04/24/19   [provider]  rivaroxaban (XARELTO) 20 MG TABS tablet Take 1 tablet (20 mg total) by mouth daily with supper. 07/20/19   Shirley Friar, PA-C  vitamin C (ASCORBIC ACID) 500 MG tablet Take 500 mg by mouth daily as needed (for cold symptoms).  [provider]  zolpidem (AMBIEN) 10 MG tablet Take 10 mg by mouth at bedtime as needed for sleep.  01/05/16   [provider]    Allergies    Sulfonamide derivatives and Sulfacetamide sodium-sulfur  Review of Systems   Review of Systems  Unable to perform ROS: Acuity of condition (level 5 caveat)    Physical Exam Updated Vital Signs There were no vitals taken for this visit.  Physical Exam Constitutional:      General: She is not in acute distress. HENT:     Head: Normocephalic and atraumatic.  Cardiovascular:     Rate and Rhythm: Normal rate and regular rhythm.  Pulmonary:     Effort: Pulmonary effort is normal. No respiratory distress.  Abdominal:     General: There is no distension.     Tenderness: There is no abdominal tenderness.  Skin:    General: Skin is warm and dry.  Neurological:     Mental Status: She is alert.     Comments: Right sided gaze preference (deviated right) Left arm and leg weakness compared to right Voice clear     ED Results / Procedures / Treatments   Labs (all labs ordered are listed, but only abnormal results are displayed) Labs Reviewed  CBG MONITORING, ED - Abnormal; Notable for the following components:      Result Value   Glucose-Capillary 151 (*)    All other components within normal  limits    EKG None  Radiology No results found.  Procedures Procedures   Medications Ordered in ED Medications - No data to display  ED Course  I have reviewed the triage vital signs and the nursing notes.  Pertinent labs & imaging results that were available during my care of the patient were reviewed by me and considered in my medical decision making (see chart for details).  80 yo female here as a code stroke, concern for possible LVO based on VAN criteria and initial presentation.  Airway intact and stable. GCS 15 on arrival.  No indication for emergent intubation  Neurology present at bedside on arrival.  Pt taken to CT scan and subsequently to IR suite.  Dr Cheral Marker (neurology) discussed diagnosis with pt and family member.  Clinical Course as of 04/24/20 1656  Thu Apr 24, 2020  1311 Taken to IR [MT]  1311 IMPRESSION: No acute intracranial hemorrhage. Acute infarction involving right temporal lobe and posterior insula. ASPECT score is 8.  Thrombus at the right MCA bifurcation occluding one branch origin and partially occluding the other.  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  Significant motion artifact through the neck. No hemodynamically significant stenosis identified [MT]    Clinical Course User Index [MT] Lacoya Wilbanks, Carola Rhine, MD    Final Clinical Impression(s) / ED Diagnoses CVA  Rx / DC Orders ED Discharge Orders    None       Wyvonnia Dusky, MD 04/24/20 [phone]

## 2020-04-24 NOTE — Telephone Encounter (Signed)
Rescheduled appt per LC schedule change. Left voicemail with cancellation and new appt date and time info.

## 2020-04-24 NOTE — Anesthesia Procedure Notes (Signed)
Arterial Line Insertion Start/End2/12/2020 1:20 PM, 04/24/2020 1:25 PM Performed by: Pervis Hocking, DO, Griffin Dakin, CRNA, anesthesiologist  Patient location: Pre-op. Preanesthetic checklist: patient identified, IV checked, site marked, risks and benefits discussed, surgical consent, monitors and equipment checked, pre-op evaluation, timeout performed and anesthesia consent Lidocaine 1% used for infiltration radial was placed Catheter size: 20 G Hand hygiene performed  and maximum sterile barriers used   Attempts: 1 Procedure performed without using ultrasound guided technique. Following insertion, dressing applied. Post procedure assessment: normal and unchanged  Patient tolerated the procedure well with no immediate complications.

## 2020-04-24 NOTE — Anesthesia Preprocedure Evaluation (Addendum)
Anesthesia Evaluation  Patient identified by MRN, date of birth, ID band Patient awake    Reviewed: Allergy & Precautions, H&P , NPO status Preop documentation limited or incomplete due to emergent nature of procedure.  Airway Mallampati: II  TM Distance: >3 FB Neck ROM: Full    Dental no notable dental hx. (+) Dental Advisory Given   Pulmonary neg pulmonary ROS,    Pulmonary exam normal breath sounds clear to auscultation       Cardiovascular Normal cardiovascular exam+ dysrhythmias (Afib in ambulance per pts daughter, NSR in preop bay) Atrial Fibrillation  Rhythm:Regular Rate:Normal     Neuro/Psych PSYCHIATRIC DISORDERS Depression CODE STROKE- L facial droop, left sided weakness TIA (hx multiple TIAs)CVA, Residual Symptoms    GI/Hepatic Neg liver ROS, GERD  Controlled,  Endo/Other  negative endocrine ROS  Renal/GU negative Renal ROS  negative genitourinary   Musculoskeletal  (+) Arthritis , Osteoarthritis,    Abdominal   Peds negative pediatric ROS (+)  Hematology negative hematology ROS (+)   Anesthesia Other Findings   Reproductive/Obstetrics negative OB ROS                             Anesthesia Physical Anesthesia Plan  ASA: III and emergent  Anesthesia Plan: General   Post-op Pain Management:    Induction: Intravenous and Rapid sequence  PONV Risk Score and Plan: 3 and Treatment may vary due to age or medical condition and Ondansetron  Airway Management Planned: Oral ETT  Additional Equipment: None  Intra-op Plan:   Post-operative Plan: Extubation in OR and Possible Post-op intubation/ventilation  Informed Consent: I have reviewed the patients History and Physical, chart, labs and discussed the procedure including the risks, benefits and alternatives for the proposed anesthesia with the patient or authorized representative who has indicated his/her understanding and  acceptance.     Dental advisory given and Consent reviewed with POA  Plan Discussed with: CRNA  Anesthesia Plan Comments: (D/w daughter)       Anesthesia Quick Evaluation

## 2020-04-24 NOTE — Anesthesia Procedure Notes (Signed)
Procedure Name: Intubation Date/Time: 04/24/2020 1:18 PM Performed by: Griffin Dakin, CRNA Pre-anesthesia Checklist: Patient identified, Emergency Drugs available, Suction available and Patient being monitored Patient Re-evaluated:Patient Re-evaluated prior to induction Oxygen Delivery Method: Circle system utilized Preoxygenation: Pre-oxygenation with 100% oxygen Induction Type: IV induction, Rapid sequence and Cricoid Pressure applied Laryngoscope Size: Glidescope and 3 Grade View: Grade I Tube type: Oral Tube size: 7.0 mm Number of attempts: 1 Airway Equipment and Method: Video-laryngoscopy and Rigid stylet Placement Confirmation: ETT inserted through vocal cords under direct vision,  positive ETCO2,  breath sounds checked- equal and bilateral and CO2 detector Secured at: 21 cm Tube secured with: Tape Dental Injury: Teeth and Oropharynx as per pre-operative assessment

## 2020-04-24 NOTE — CV Procedure (Signed)
INR. 79 Y LH F MRS 0 LSW 11.50 am  New onset Lt sided weakness dysarthria . and Lt gaze deviation. CT Brain No ICH. ASPECTS 8. CTA occluded inf and sup  RT MCA divisions  With pervious thrombus in the distal M1 seg.  Endovascular treatment D/W patient and daughter.Procedure,reasons,and alternatives reviewed. Risks of ICH of 10 %,worsening neuro deficit ,death and inability to revascularize discussed. Daughter expressed understanding and consented to the treatment. S.Treyon Wymore MD

## 2020-04-24 NOTE — Transfer of Care (Signed)
Immediate Anesthesia Transfer of Care Note  Patient: Felicia Hunter  Procedure(s) Performed: IR WITH ANESTHESIA (N/A )  Patient Location: ICU  Anesthesia Type:General  Level of Consciousness: drowsy and patient cooperative  Airway & Oxygen Therapy: Patient Spontanous Breathing and Patient connected to face mask oxygen  Post-op Assessment: Report given to RN and Post -op Vital signs reviewed and stable  Post vital signs: Reviewed and stable  Last Vitals:  Vitals Value Taken Time  BP 137/80 04/24/20 1515  Temp 36.4 C 04/24/20 1515  Pulse 65 04/24/20 1528  Resp 14 04/24/20 1528  SpO2 100 % 04/24/20 1528  Vitals shown include unvalidated device data.  Last Pain:  Vitals:   04/24/20 1515  TempSrc:   PainSc: 0-No pain         Complications: No complications documented.

## 2020-04-24 NOTE — Procedures (Signed)
S//P RT common carotid artreriogram RT CFA approach. S/P complete revascularization of occluded RT MCA distal M! And both divisions with x 1 pass with 1mmx 37 mm embotrap retriever and x 1 pass with 16mmx 40 mm solitaire X retriever and aspiration achieving a TICI 3 revascularization. POst CT No ICH or mass effect. 36F angioseal fotr Rt groin hemostasis. Distal pulses all present. Extubated.  Moving all 4s spontaneously and to command. LUE and LLE 4/5 with a weak Lt hand grip. Speech slurred. Pupils 2 to 3 mm Rt =LT S.Keenon Leitzel MD

## 2020-04-24 NOTE — Code Documentation (Addendum)
Patient from home cutting her husbands hair outside when she had sudden onset of slurred speech and left-sided weakness at 1150. She nearly fell but her husband caught her. Family called 911. [facility] arrived and activated the code stroke. Patient taken to [facility] and met by the stroke team and EDP. Pt cleared for scans. CT/CTA completed. Results below. Pt was off of her Xarelto for one day in prep for a colonoscopy. She does have a hx of CVA's but no baseline deficits. She was in AFIB on the way to the hospital with EMS. On assessment pt has a right-sided forced gaze, facial droop, left-sided weakness and extinction with sensory deficits. Her NIHSS is a 9 (see documentation for more detail). No TPA r/t use of Xarelto. Neurologist called and spoke with Dr. Estanislado Pandy to decide how to procede. Pt's daughter had arrived at this time and decided to give consent for IR procedure after given all of the risks and benefits. Pt was taken to IR Bay 8 and prepped, COVID swab completed, intubated, and taken into IR suite. Family is in the waiting area. MD aware and will update when procedure complete. Hand off with Marylyn Ishihara, RN.   "IMPRESSION: No acute intracranial hemorrhage. Acute infarction involving right temporal lobe and posterior insula. ASPECT score is 8.  Thrombus at the right MCA bifurcation occluding one branch origin and partially occluding the other.  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  Significant motion artifact through the neck. No hemodynamically significant stenosis identified."  Zachary Lovins, Rande Brunt, RN

## 2020-04-24 NOTE — Consult Note (Addendum)
Referring Physician: Dr. Langston Masker    Chief Complaint: Acute onset of left facial droop, left sided weakness, dysarthria and rightward gaze deviation  HPI: Felicia Hunter is an 80 y.o. female with a PMHx of A- fib on Xarelto, hyperlipidemia, hypertension, MVP, CVA* 2 (2013, 2014) no baseline neurological deficits, breast cancer s/p bilateral mastectomy 2011, polymyalgia rheumatica, GERD, colon polyps, s/p hysterectomy, hip and total knee replacement who presented to [facility] after acute onset of slurred speech and left sided weakness.   Patient was in her usual state of health until 11:50 am this morning, when she started developing slurring of speech and left sided weakness, noticed by her husband. She was outside cutting her husband's hair and nearly fell but was caught by her husband.  On presentation she is alert, awake, oriented to person but not to time or place. She has right-sided gaze preference, left sided weakness and dysarthric speech. Able to follow commands and answer questions appropriately with extinction on left and sensory deficits.  She is on anticoagulation with Xarelto 20 mg Q day for A-fib  LSN: 11:50 am tPA Given: No: on anticoagulation Modified ranking scale: 0  NIHSS 1a: 0 1b: 0 1c:0 2:+1 3: 0 4:+1 5A:+1 5B: 0 6A:+1 6B:0 7:+1 8: +2 9:0 10: +1 11: +1 Total: 9  EMT vitals: BP 131/47, RR 22, 96% O2 on room air, in A-fib (80's-120's)  Past Medical History:  Diagnosis Date  . Allergy   . Alopecia   . Arthritis   . Atrial fibrillation ([place])   . Breast cancer ([place])    double mastectomy 2011  . Clavicle fracture    right clavicle-hardware retained  . Clotting disorder ([place])    with CVA- 2013, 2014  . Cough    PERSISTANT COUGH X 20 YRS  . Difficulty sleeping    takes Ambien  . Disorders of bilirubin excretion   . Diverticulosis   . Environmental and seasonal allergies    "dry cough", related to ? allergies  . GERD (gastroesophageal reflux disease)   .  Hyperlipidemia   . MVP (mitral valve prolapse)   . Neuromuscular disorder ([place])    Polypmyalgia  Pheumatica  . Osteopenia   . Osteoporosis   . Stroke ([place]) 06/07/11   x2 episodes-"funny feeling"" equilibrium off x24 hours, x1 speech affected- no residuallast 1 yr ago.  . Tubulovillous adenoma of colon 08/2003    Past Surgical History:  Procedure Laterality Date  . ABDOMINAL HYSTERECTOMY  1993  . BREAST CAPSULOTOMY     after bilat mastectomies-implants removed  . BREAST ENHANCEMENT SURGERY     x3-then removed 2011 for bilat mastectomies  . BREAST SURGERY  june 2011   double mastectomy nodes from rt  . CHEST WALL TUMOR EXCISION  5/12   noduals removed-neg  . collarbone  september 2011   pinned and plated, right  . COLONOSCOPY    . JOINT REPLACEMENT  july 9th, 2012   left knee replacement , Right knee replacement 09-13-17 Dr. Alvan Dame  . KNEE RECONSTRUCTION  09/2010   revision lt total knee   left  . LOOP RECORDER IMPLANT N/A 11/07/2013   Procedure: LOOP RECORDER IMPLANT;  Surgeon: Evans Lance, MD;  Location: [facility] CATH LAB;  Service: Cardiovascular;  Laterality: N/A;  . LOOP RECORDER REMOVAL N/A 05/16/2017   Procedure: LOOP RECORDER REMOVAL;  Surgeon: Evans Lance, MD;  Location: [facility] CV LAB;  Service: Cardiovascular;  Laterality: N/A;  . MASS EXCISION Left 06/19/2012  Procedure: EXCISION OF LARGE MASS LEFT NECK/SHOULDER WITH PLASTIC CLOSURE;  Surgeon: Cristine Polio, MD;  Location: [facility];  Service: Plastics;  Laterality: Left;  . NASAL SEPTUM SURGERY    . TEE WITHOUT CARDIOVERSION N/A 11/07/2013   Procedure: TRANSESOPHAGEAL ECHOCARDIOGRAM (TEE);  Surgeon: Dorothy Spark, MD;  Location: [facility];  Service: Cardiovascular;  Laterality: N/A;  . TONSILLECTOMY    . TOTAL HIP ARTHROPLASTY Right 04/10/2013   Procedure: RIGHT TOTAL HIP ARTHROPLASTY ANTERIOR APPROACH;  Surgeon: [name], MD;  Location: [facility];  Service: Orthopedics;  Laterality: Right;  .  TOTAL HIP ARTHROPLASTY Left 12/31/2014   Procedure: LEFT TOTAL HIP ARTHROPLASTY ANTERIOR APPROACH;  Surgeon: Paralee Cancel, MD;  Location: [facility];  Service: Orthopedics;  Laterality: Left;  . TOTAL KNEE ARTHROPLASTY Right 09/13/2017   Procedure: RIGHT TOTAL KNEE ARTHROPLASTY;  Surgeon: Paralee Cancel, MD;  Location: [facility];  Service: Orthopedics;  Laterality: Right;  70 mins  . UPPER GASTROINTESTINAL ENDOSCOPY    . VEIN SURGERY  1974   left leg    Family History  Problem Relation Age of Onset  . Aneurysm Father   . Colon cancer Neg Hx   . Rectal cancer Neg Hx   . Stomach cancer Neg Hx   . Esophageal cancer Neg Hx    Social History:  reports that she has never smoked. She has never used smokeless tobacco. She reports current alcohol use of about 1.0 standard drink of alcohol per week. She reports that she does not use drugs.  Allergies:  Allergies  Allergen Reactions  . Sulfonamide Derivatives Rash  . Sulfacetamide Sodium-Sulfur Rash    Medications Current Outpatient Medications  Medication Instructions  . acetaminophen (TYLENOL) 1,000 mg, Oral, As needed  . alendronate (FOSAMAX) 70 mg, Oral, Every Sun  . atorvastatin (LIPITOR) 10 mg, Oral, Daily  . BIOTIN PO 1 tablet, Daily  . Calcium Carb-Cholecalciferol (CALCIUM 600/VITAMIN D3 PO) 1 tablet, Oral, Daily  . cholecalciferol (VITAMIN D) 1,000 Units, Daily  . Multiple Vitamin (MULTIVITAMIN) capsule 1 capsule, Oral, Daily  . Omega-3 Fatty Acids (FISH OIL PO) 1 capsule, Oral, 2 times daily  . polyvinyl alcohol (LIQUIFILM TEARS) 1.4 % ophthalmic solution 1 drop, Both Eyes, As needed  . predniSONE (DELTASONE) 5 mg, Oral, Daily  . predniSONE (DELTASONE) 2 mg, Oral, Daily with breakfast  . rivaroxaban (XARELTO) 20 mg, Oral, Daily with supper  . vitamin C (ASCORBIC ACID) 500 mg, Oral, Daily PRN  . zolpidem (AMBIEN) 10 mg, Oral, At bedtime PRN     ROS: As noted in HPI  Physical Examination: Blood pressure 123/88, pulse 62,  temperature 97.6 F (36.4 C), resp. rate 20, SpO2 97 %.   General: Alert, awake, NAD HEENT: [place]/AT, MMM Cardiovascular: Regular rate, irregular rhythm Pulmonary: No respiratory distress Abdominal: Soft, nontender nondistended Extremities: Warm  Neurologic Examination: Mental Status: Alert, awake, oriented to person. Anosognosia with left sided neglect.  Speech is dysphonic and dysarthric but fluent.  Oriented to month, day of week, year, city and state but not to place.  Left hemineglect.  Able to follow simple commands.  Cranial Nerves: II: No blink to threat on left.  Intact blink to threat on right.  PERRL at 2 mm  III,IV, VI: Gaze deviation to the right which can be overcome and able to cross midline.   V,VII: Left facial droop, blinks to eyelid stimulation bilaterally, more briskly on right. VIII: hearing normal bilaterally IX,X: Unremarkable. XI: Head is midlilne XII: Unable to  assess Motor: Right : Upper extremity   5/5    Left:     Upper extremity   4/5 with drift  Lower extremity   5/5     Lower extremity   4/5 with drift Tone and bulk: Left lower extremity with increased flexor tone Sensory: Decreased left-sided sensation  Deep Tendon Reflexes:  Brisk patellar reflex on left lower extremity.  Toes upgoing bilaterally Cerebellar: No ataxia with FNF on right, but unable to perform appropriately on left. Gait: Unable to assess   Results for orders placed or performed during the hospital encounter of 04/24/20 (from the past 48 hour(s))  Protime-INR     Status: None   Collection Time: 04/24/20 12:20 PM  Result Value Ref Range   Prothrombin Time 13.2 11.4 - 15.2 seconds   INR 1.0 0.8 - 1.2    Comment: (NOTE) INR goal varies based on device and disease states. Performed at [facility], [place] [address] [place], [place] [address]   APTT     Status: None   Collection Time: 04/24/20 12:20 PM  Result Value Ref Range   aPTT 27 24 - 36 seconds    Comment: Performed at  [facility] [address] [place], [place] [address]  CBC     Status: None   Collection Time: 04/24/20 12:20 PM  Result Value Ref Range   WBC 9.8 4.0 - 10.5 K/uL   RBC 4.31 3.87 - 5.11 MIL/uL   Hemoglobin 13.1 12.0 - 15.0 g/dL   HCT 42.2 36.0 - 46.0 %   MCV 97.9 80.0 - 100.0 fL   MCH 30.4 26.0 - 34.0 pg   MCHC 31.0 30.0 - 36.0 g/dL   RDW 13.2 11.5 - 15.5 %   Platelets 288 150 - 400 K/uL   nRBC 0.0 0.0 - 0.2 %    Comment: Performed at [facility], [place] [address] [place], [place] [address]  Differential     Status: Abnormal   Collection Time: 04/24/20 12:20 PM  Result Value Ref Range   Neutrophils Relative % 88 %   Neutro Abs 8.5 (H) 1.7 - 7.7 K/uL   Lymphocytes Relative 8 %   Lymphs Abs 0.8 0.7 - 4.0 K/uL   Monocytes Relative 4 %   Monocytes Absolute 0.4 0.1 - 1.0 K/uL   Eosinophils Relative 0 %   Eosinophils Absolute 0.0 0.0 - 0.5 K/uL   Basophils Relative 0 %   Basophils Absolute 0.0 0.0 - 0.1 K/uL   Immature Granulocytes 0 %   Abs Immature Granulocytes 0.04 0.00 - 0.07 K/uL    Comment: Performed at [facility] [address], [place], [place] [address]  Comprehensive metabolic panel     Status: Abnormal   Collection Time: 04/24/20 12:20 PM  Result Value Ref Range   Sodium 140 135 - 145 mmol/L   Potassium 3.8 3.5 - 5.1 mmol/L   Chloride 106 98 - 111 mmol/L   CO2 24 22 - 32 mmol/L   Glucose, Bld 165 (H) 70 - 99 mg/dL    Comment: Glucose reference range applies only to samples taken after fasting for at least 8 hours.   BUN 14 8 - 23 mg/dL   Creatinine, Ser 0.94 0.44 - 1.00 mg/dL   Calcium 9.0 8.9 - 10.3 mg/dL   Total Protein 6.3 (L) 6.5 - 8.1 g/dL   Albumin 3.7 3.5 - 5.0 g/dL   AST 23 15 - 41 U/L  ALT 16 0 - 44 U/L   Alkaline Phosphatase 40 38 - 126 U/L   Total Bilirubin 1.7 (H) 0.3 - 1.2 mg/dL   GFR, Estimated >60 >60 mL/min    Comment: (NOTE) Calculated using the CKD-EPI Creatinine Equation (2021)    Anion gap 10 5 - 15    Comment:  Performed at [facility] [address], [place],  [address]  CBG monitoring, ED     Status: Abnormal   Collection Time: 04/24/20 12:36 PM  Result Value Ref Range   Glucose-Capillary 151 (H) 70 - 99 mg/dL    Comment: Glucose reference range applies only to samples taken after fasting for at least 8 hours.  I-stat chem 8, ED     Status: Abnormal   Collection Time: 04/24/20 12:45 PM  Result Value Ref Range   Sodium 139 135 - 145 mmol/L   Potassium 3.7 3.5 - 5.1 mmol/L   Chloride 104 98 - 111 mmol/L   BUN 15 8 - 23 mg/dL   Creatinine, Ser 0.80 0.44 - 1.00 mg/dL   Glucose, Bld 166 (H) 70 - 99 mg/dL    Comment: Glucose reference range applies only to samples taken after fasting for at least 8 hours.   Calcium, Ion 1.10 (L) 1.15 - 1.40 mmol/L   TCO2 22 22 - 32 mmol/L   Hemoglobin 12.9 12.0 - 15.0 g/dL   HCT 38.0 36.0 - 46.0 %  Resp Panel by RT-PCR (Flu A&B, Covid) Nasopharyngeal Swab     Status: None   Collection Time: 04/24/20 12:56 PM   Specimen: Nasopharyngeal Swab; Nasopharyngeal(NP) swabs in vial transport medium  Result Value Ref Range   SARS Coronavirus 2 by RT PCR NEGATIVE NEGATIVE    Comment: (NOTE) SARS-CoV-2 target nucleic acids are NOT DETECTED.  The SARS-CoV-2 RNA is generally detectable in upper respiratory specimens during the acute phase of infection. The lowest concentration of SARS-CoV-2 viral copies this assay can detect is 138 copies/mL. A negative result does not preclude SARS-Cov-2 infection and should not be used as the sole basis for treatment or other patient management decisions. A negative result may occur with  improper specimen collection/handling, submission of specimen other than nasopharyngeal swab, presence of viral mutation(s) within the areas targeted by this assay, and inadequate number of viral copies(<138 copies/mL). A negative result must be combined with clinical observations, patient history, and epidemiological information. The  expected result is Negative.  Fact Sheet for Patients:  EntrepreneurPulse.com.au  Fact Sheet for Healthcare Providers:  IncredibleEmployment.be  This test is no t yet approved or cleared by the [place] FDA and  has been authorized for detection and/or diagnosis of SARS-CoV-2 by FDA under an Emergency Use Authorization (EUA). This EUA will remain  in effect (meaning this test can be used) for the duration of the COVID-19 declaration under Section 564(b)(1) of the Act, 21 U.S.C.section 360bbb-3(b)(1), unless the authorization is terminated  or revoked sooner.       Influenza A by PCR NEGATIVE NEGATIVE   Influenza B by PCR NEGATIVE NEGATIVE    Comment: (NOTE) The Xpert Xpress SARS-CoV-2/FLU/RSV plus assay is intended as an aid in the diagnosis of influenza from Nasopharyngeal swab specimens and should not be used as a sole basis for treatment. Nasal washings and aspirates are unacceptable for Xpert Xpress SARS-CoV-2/FLU/RSV testing.  Fact Sheet for Patients: EntrepreneurPulse.com.au  Fact Sheet for Healthcare Providers: IncredibleEmployment.be  This test is not yet approved or cleared by the [place] FDA  and has been authorized for detection and/or diagnosis of SARS-CoV-2 by FDA under an Emergency Use Authorization (EUA). This EUA will remain in effect (meaning this test can be used) for the duration of the COVID-19 declaration under Section 564(b)(1) of the Act, 21 U.S.C. section 360bbb-3(b)(1), unless the authorization is terminated or revoked.  Performed at [facility], [place] [address], [place], [place] [address]    Imaging  CT Head, 04/24/2020: No acute intracranial hemorrhage. Acute infarction involving right temporal lobe and posterior insula. ASPECT score is 8.   CT Angio head and neck: Thrombus at the right MCA bifurcation occluding one branch origin and partially occluding the  other. There is a 1 mm inferiorly directed aneurysm or infundibulum of the distal supraclinoid right ICA. Significant motion artifact through the neck. No hemodynamically significant stenosis identified.   Assessment: 80 y.o. female presenting with acute onset of left sided weakness, left facial droop, rightward gaze deviation and left hemineglect. - CT head: No acute intracranial hemorrhage. Acute infarction involving right temporal lobe and posterior insula. ASPECT score is 8. -CTA head and neck: Thrombus at the right MCA bifurcation occluding one branch origin and partially occluding the other. -IV tPA is contraindicated as patient is on anticoagulation with Xarelto. -The patient is a candidate for endovascular thrombectomy.  Discussed extensively the risk/benefit of thrombectomy treatment versus nontreatment with patient's daughter as patient has anosognosia. Risks include an approximate 10% likelihood of SAH, with which there is significant chance for death along with morbidity, versus worse overall outcomes on average in patient within thrombectomy 24-hour time window who do not undergo the procedure.  Overall benefits of thrombectomy regarding long-term prognosis are felt to outweigh risks.  The patient's daughter expressed understanding and wished to proceed with thrombectomy.  Consent form signed.  Case discussed with Dr. Estanislado Pandy.  -Stroke Risk Factors - atrial fibrillation, hyperlipidemia and hypertension  Plan: 1. Following VIR, will admit to the Neuro ICU. 2. No antiplatelet medications or anticoagulants for at least 24 hours following VIR  3. DVT prophylaxis with SCDs  4. Follow up HgbA1c, fasting lipid panel 5. MRI brain for 04/25/2020 6. Follow up UDS 7. PT consult, OT consult, Speech consult 8. Echocardiogram 9. Continue Lipitor 10. Telemetry monitoring 11. Frequent neuro checks and BP management 12.  N.p.o. until passes swallow evaluation 13. Post-VIR BP control per Dr.  Estanislado Pandy  Assisting with this consultation: Honor Junes, MD PGY-1, Resident  60 minutes of critical care time spent in the emergent neurological evaluation and management of this critically ill patient.   @Electronically  signed: Dr. Kerney Elbe  04/24/2020, 4:22 PM

## 2020-04-25 ENCOUNTER — Encounter (HOSPITAL_COMMUNITY): Payer: Self-pay | Admitting: Radiology

## 2020-04-25 ENCOUNTER — Telehealth: Payer: Self-pay

## 2020-04-25 ENCOUNTER — Inpatient Hospital Stay (HOSPITAL_COMMUNITY): Payer: Medicare Other

## 2020-04-25 DIAGNOSIS — I6310 Cerebral infarction due to embolism of unspecified precerebral artery: Secondary | ICD-10-CM

## 2020-04-25 DIAGNOSIS — I6601 Occlusion and stenosis of right middle cerebral artery: Secondary | ICD-10-CM | POA: Diagnosis not present

## 2020-04-25 DIAGNOSIS — I480 Paroxysmal atrial fibrillation: Secondary | ICD-10-CM | POA: Diagnosis not present

## 2020-04-25 DIAGNOSIS — Z8673 Personal history of transient ischemic attack (TIA), and cerebral infarction without residual deficits: Secondary | ICD-10-CM

## 2020-04-25 DIAGNOSIS — I6389 Other cerebral infarction: Secondary | ICD-10-CM

## 2020-04-25 LAB — CBC WITH DIFFERENTIAL/PLATELET
Abs Immature Granulocytes: 0 10*3/uL (ref 0.00–0.07)
Basophils Absolute: 0 10*3/uL (ref 0.0–0.1)
Basophils Relative: 0 %
Eosinophils Absolute: 0 10*3/uL (ref 0.0–0.5)
Eosinophils Relative: 0 %
HCT: 36.9 % (ref 36.0–46.0)
Hemoglobin: 12.2 g/dL (ref 12.0–15.0)
Immature Granulocytes: 0 %
Lymphocytes Relative: 9 %
Lymphs Abs: 0.5 10*3/uL — ABNORMAL LOW (ref 0.7–4.0)
MCH: 31.6 pg (ref 26.0–34.0)
MCHC: 33.1 g/dL (ref 30.0–36.0)
MCV: 95.6 fL (ref 80.0–100.0)
Monocytes Absolute: 0.4 10*3/uL (ref 0.1–1.0)
Monocytes Relative: 8 %
Neutro Abs: 3.9 10*3/uL (ref 1.7–7.7)
Neutrophils Relative %: 83 %
Platelets: 261 10*3/uL (ref 150–400)
RBC: 3.86 MIL/uL — ABNORMAL LOW (ref 3.87–5.11)
RDW: 13.3 % (ref 11.5–15.5)
WBC: 4.8 10*3/uL (ref 4.0–10.5)
nRBC: 0 % (ref 0.0–0.2)

## 2020-04-25 LAB — BASIC METABOLIC PANEL
Anion gap: 10 (ref 5–15)
BUN: 11 mg/dL (ref 8–23)
CO2: 23 mmol/L (ref 22–32)
Calcium: 8.6 mg/dL — ABNORMAL LOW (ref 8.9–10.3)
Chloride: 108 mmol/L (ref 98–111)
Creatinine, Ser: 0.71 mg/dL (ref 0.44–1.00)
GFR, Estimated: >60 mL/min (ref >60)
Glucose, Bld: 114 mg/dL — ABNORMAL HIGH (ref 70–99)
Potassium: 3.9 mmol/L (ref 3.5–5.1)
Sodium: 141 mmol/L (ref 135–145)

## 2020-04-25 LAB — ECHOCARDIOGRAM COMPLETE
AR max vel: 2.12 cm2
AV Area VTI: 2.12 cm2
AV Area mean vel: 2.07 cm2
AV Mean grad: 5 mmHg
AV Peak grad: 9.7 mmHg
Ao pk vel: 1.56 m/s
Area-P 1/2: 2.91 cm2
Height: 65 in
MV VTI: 1.8 cm2
S' Lateral: 2.7 cm
Weight: 2186.96 oz

## 2020-04-25 LAB — HEMOGLOBIN A1C
Hgb A1c MFr Bld: 5.8 % — ABNORMAL HIGH (ref 4.8–5.6)
Mean Plasma Glucose: 119.76 mg/dL

## 2020-04-25 LAB — LIPID PANEL
Cholesterol: 154 mg/dL (ref 0–200)
HDL: 83 mg/dL (ref >40)
LDL Cholesterol: 59 mg/dL (ref 0–99)
Total CHOL/HDL Ratio: 1.9 RATIO
Triglycerides: 59 mg/dL (ref <150)
VLDL: 12 mg/dL (ref 0–40)

## 2020-04-25 MED ORDER — ZOLPIDEM TARTRATE 5 MG PO TABS: 10.0000 mg | ORAL_TABLET | Freq: Every evening | ORAL | Status: DC | PRN

## 2020-04-25 MED ORDER — ADULT MULTIVITAMIN W/MINERALS CH
1.0000 | ORAL_TABLET | Freq: Every day | ORAL | Status: DC
  Administered 2020-04-25 – 2020-04-26 (×2): 1 via ORAL
  Filled 2020-04-25: qty 1

## 2020-04-25 MED ORDER — HYDRALAZINE HCL 20 MG/ML IJ SOLN: 5.0000 mg | INTRAMUSCULAR | Status: DC | PRN

## 2020-04-25 MED ORDER — VITAMIN D 25 MCG (1000 UNIT) PO TABS
1000.0000 [IU] | ORAL_TABLET | Freq: Every day | ORAL | Status: DC
  Administered 2020-04-25 – 2020-04-26 (×2): 1000 [IU] via ORAL
  Filled 2020-04-25: qty 1

## 2020-04-25 MED ORDER — MULTIVITAMINS PO CAPS: 1.0000 | ORAL_CAPSULE | Freq: Every day | ORAL | Status: DC

## 2020-04-25 MED ORDER — ASPIRIN EC 325 MG PO TBEC
325.0000 mg | DELAYED_RELEASE_TABLET | Freq: Every day | ORAL | Status: DC
  Administered 2020-04-25: 325 mg via ORAL
  Filled 2020-04-25: qty 1

## 2020-04-25 NOTE — Evaluation (Addendum)
Occupational Therapy Evaluation Patient Details Name: LEEBA BARBE MRN: [id number] DOB: 01-24-1941 Today's Date: 04/25/2020    History of Present Illness 80 yo female presenting with ED on 2/10 with left sided weakness, slurred speech, and R gaze. MRI showing small acute cortical infarct in the posterolateral R temporal lobe and scattered puncate infarcts in R lentiform, perirolandic cortex. s/p R common carotid arteriogram via R CFA approach with complete revascularization of occluded R MCA on 2/10. PMH including A fib, breast cancer s/p double mastectomy (2011), clavical fx, hyperlipidemia, MVP, CVA (2013), osteoporosis, depression, R TKA, B THA.   Clinical Impression   PTA, pt was living with alone and was independent; enjoys water aerobics and going on hikes. Pt currently requiring Supervision-Min guard A for ADLs and Min guard A for safety functional mobility. Pt presenting with decreased awareness, problem solving, executive functioning, and balance. Pt requiring increased time. Pt would benefit from further acute OT to facilitate safe dc. Recommend dc to home with follow up at OP for further OT to optimize safety, independence with ADLs, and return to PLOF.     Follow Up Recommendations  Outpatient OT;Supervision/Assistance - 24 hour (neuro OP; initial 24/7)    Equipment Recommendations  Tub/shower seat    Recommendations for Other Services       Precautions / Restrictions Precautions Precautions: Fall Restrictions Weight Bearing Restrictions: No      Mobility Bed Mobility Overal bed mobility: Needs Assistance Bed Mobility: Supine to Sit     Supine to sit: Min guard     General bed mobility comments: Min guard A for safety    Transfers Overall transfer level: Needs assistance   Transfers: Sit to/from Stand Sit to Stand: Min guard         General transfer comment: Min guard A for safety    Balance Overall balance assessment: Needs  assistance Sitting-balance support: No upper extremity supported;Feet supported Sitting balance-Leahy Scale: Good     Standing balance support: No upper extremity supported;During functional activity Standing balance-Leahy Scale: Fair Standing balance comment: able to ambulate without AD, mild unsteadiness noted               High Level Balance Comments: WFL: fast/slow walking, picking up object from floor           ADL either performed or assessed with clinical judgement   ADL Overall ADL's : Needs assistance/impaired                                             Vision Baseline Vision/History: Wears glasses Vision Assessment?: No apparent visual deficits     Perception     Praxis      Pertinent Vitals/Pain Pain Assessment: No/denies pain     Hand Dominance Left   Extremity/Trunk Assessment Upper Extremity Assessment Upper Extremity Assessment: Overall WFL for tasks assessed (some moments of inattention to RUE?)   Lower Extremity Assessment Lower Extremity Assessment: Defer to PT evaluation   Cervical / Trunk Assessment Cervical / Trunk Assessment: Normal   Communication Communication Communication: No difficulties   Cognition Arousal/Alertness: Awake/alert Behavior During Therapy: WFL for tasks assessed/performed Overall Cognitive Status: Impaired/Different from baseline Area of Impairment: Safety/judgement;Problem solving                         Safety/Judgement: Decreased awareness of safety;Decreased awareness  of deficits   Problem Solving: Slow processing;Requires verbal cues General Comments: Pt presenting with decreased executive function, problem solving, and progmatic skills. Pt talking about therapist at times and requiring increased time to follow cues. Pt with poor awareness of her deficits and significance of situation. Pt also with moments of laughing that were slightly inappropiate to situation or conversation.  Pt able to name 2/3 animals that started with F and then requiring increased time to name third. Requiring increased time to answer simple money management questions. Pt able to perform simple one step trail making task.   General Comments  Daughter present. VSS on RA    Exercises     Shoulder Instructions      Home Living Family/patient expects to be discharged to:: Private residence Living Arrangements: Alone Available Help at Discharge: Family;Available PRN/intermittently Type of Home: House Home Access: Stairs to enter Entrance Stairs-Number of Steps: 3 Entrance Stairs-Rails:  (uses brick wall) Home Layout: Two level Alternate Level Stairs-Number of Steps: flight Alternate Level Stairs-Rails: Right (switches halfway through) Bathroom Shower/Tub: Occupational psychologist: Standard     Home Equipment: Toilet riser;Walker - 2 wheels;Kasandra Knudsen - single point      Lives With: Alone    Prior Functioning/Environment Level of Independence: Independent        Comments: enjoys playing ukelele, going hiking with her boyfriend [name], and doing pool fitness classes        OT Problem List: Decreased strength;Impaired balance (sitting and/or standing);Decreased cognition;Decreased safety awareness;Decreased knowledge of precautions      OT Treatment/Interventions: Self-care/ADL training;Therapeutic exercise;Energy conservation;DME and/or AE instruction;Therapeutic activities;Patient/family education    OT Goals(Current goals can be found in the care plan section) Acute Rehab OT Goals Patient Stated Goal: go home ASAP OT Goal Formulation: With patient/family Time For Goal Achievement: 05/09/20 Potential to Achieve Goals: Good  OT Frequency: Min 2X/week   Barriers to D/C:            Co-evaluation              AM-PAC OT "6 Clicks" Daily Activity     Outcome Measure Help from another person eating meals?: None Help from another person taking care of personal  grooming?: A Little Help from another person toileting, which includes using toliet, bedpan, or urinal?: A Little Help from another person bathing (including washing, rinsing, drying)?: A Little Help from another person to put on and taking off regular upper body clothing?: A Little Help from another person to put on and taking off regular lower body clothing?: A Little 6 Click Score: 19   End of Session Equipment Utilized During Treatment: Gait belt Nurse Communication: Mobility status  Activity Tolerance: Patient tolerated treatment well Patient left:  (with PT)  OT Visit Diagnosis: Unsteadiness on feet (R26.81);Other abnormalities of gait and mobility (R26.89);Muscle weakness (generalized) (M62.81);Cognitive communication deficit (R41.841);Other symptoms and signs involving cognitive function Symptoms and signs involving cognitive functions: Cerebral infarction                Time: 8563-1497 OT Time Calculation (min): 15 min Charges:  OT General Charges $OT Visit: 1 Visit OT Evaluation $OT Eval Moderate Complexity: Sharon, OTR/L Acute Rehab Pager: [phone] Office: [place] 04/25/2020, 1:15 PM

## 2020-04-25 NOTE — Progress Notes (Signed)
  Echocardiogram 2D Echocardiogram has been performed.  Felicia Hunter 04/25/2020, 9:45 AM

## 2020-04-25 NOTE — Evaluation (Signed)
Speech Language Pathology Evaluation Patient Details Name: Felicia Hunter MRN: [id number] DOB: 05-22-1940 Today's Date: 04/25/2020 Time: 8546-2703 SLP Time Calculation (min) (ACUTE ONLY): 19 min  Problem List:  Patient Active Problem List   Diagnosis Date Noted  . Stroke (cerebrum) ([place]) 04/24/2020  . Middle cerebral artery embolism, right 04/24/2020  . Chronic cough 08/08/2018  . Solitary pulmonary nodule 03/30/2018  . S/P right TKA 09/13/2017  . S/P total knee replacement 09/13/2017  . Right leg pain 03/01/2016  . S/P left THA, AA 12/31/2014  . Cerebral infarction due to embolism of precerebral artery ([place]) 12/20/2013  . History of stroke 12/20/2013  . Expressive aphasia 11/05/2013  . Expected blood loss anemia 04/11/2013  . Hyponatremia 04/11/2013  . Other and unspecified hyperlipidemia 06/15/2012  . Lacunar infarction ([place]) 06/15/2012  . CVA (cerebral infarction)-mid/inf cerebellar infarct 3/25 06/08/2011  . Near syncope 06/07/2011  . 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 DEPRESSION 10/28/2006  . ALLERGIC RHINITIS 10/28/2006  . ASTHMA 10/28/2006   Past Medical History:  Past Medical History:  Diagnosis Date  . Allergy   . Alopecia   . Arthritis   . Atrial fibrillation ([place])   . Breast cancer ([place])    double mastectomy 2011  . Clavicle fracture    right clavicle-hardware retained  . Clotting disorder ([place])    with CVA- 2013, 2014  . Cough    PERSISTANT COUGH X 20 YRS  . Difficulty sleeping    takes Ambien  . Disorders of bilirubin excretion   . Diverticulosis   . Environmental and seasonal allergies    "dry cough", related to ? allergies  . GERD (gastroesophageal reflux disease)   . Hyperlipidemia   . MVP (mitral valve prolapse)   . Neuromuscular disorder ([place])    Polypmyalgia  Pheumatica  . Osteopenia   . Osteoporosis   . Stroke ([place]) 06/07/11   x2 episodes-"funny feeling"" equilibrium off x24 hours, x1 speech affected- no  residuallast 1 yr ago.  . Tubulovillous adenoma of colon 08/2003   Past Surgical History:  Past Surgical History:  Procedure Laterality Date  . ABDOMINAL HYSTERECTOMY  1993  . BREAST CAPSULOTOMY     after bilat mastectomies-implants removed  . BREAST ENHANCEMENT SURGERY     x3-then removed 2011 for bilat mastectomies  . BREAST SURGERY  june 2011   double mastectomy nodes from rt  . CHEST WALL TUMOR EXCISION  5/12   noduals removed-neg  . collarbone  september 2011   pinned and plated, right  . COLONOSCOPY    . JOINT REPLACEMENT  july 9th, 2012   left knee replacement , Right knee replacement 09-13-17 Dr. Alvan Dame  . KNEE RECONSTRUCTION  09/2010   revision lt total knee   left  . LOOP RECORDER IMPLANT N/A 11/07/2013   Procedure: LOOP RECORDER IMPLANT;  Surgeon: Evans Lance, MD;  Location: [facility] CATH LAB;  Service: Cardiovascular;  Laterality: N/A;  . LOOP RECORDER REMOVAL N/A 05/16/2017   Procedure: LOOP RECORDER REMOVAL;  Surgeon: Evans Lance, MD;  Location: [place] CV LAB;  Service: Cardiovascular;  Laterality: N/A;  . MASS EXCISION Left 06/19/2012   Procedure: EXCISION OF LARGE MASS LEFT NECK/SHOULDER WITH PLASTIC CLOSURE;  Surgeon: Cristine Polio, MD;  Location: [facility];  Service: Plastics;  Laterality: Left;  . NASAL SEPTUM SURGERY    . RADIOLOGY WITH ANESTHESIA N/A 04/24/2020   Procedure: IR WITH ANESTHESIA;  Surgeon: Radiologist, Medication, MD;  Location: [facility]  OR;  Service: Radiology;  Laterality: N/A;  . TEE WITHOUT CARDIOVERSION N/A 11/07/2013   Procedure: TRANSESOPHAGEAL ECHOCARDIOGRAM (TEE);  Surgeon: Dorothy Spark, MD;  Location: [place];  Service: Cardiovascular;  Laterality: N/A;  . TONSILLECTOMY    . TOTAL HIP ARTHROPLASTY Right 04/10/2013   Procedure: RIGHT TOTAL HIP ARTHROPLASTY ANTERIOR APPROACH;  Surgeon: [name], MD;  Location: [facility] ORS;  Service: Orthopedics;  Laterality: Right;  . TOTAL HIP ARTHROPLASTY Left 12/31/2014   Procedure:  LEFT TOTAL HIP ARTHROPLASTY ANTERIOR APPROACH;  Surgeon: Paralee Cancel, MD;  Location: [facility] OR[facility];  Service: Orthopedics;  Laterality: Left;  . TOTAL KNEE ARTHROPLASTY Right 09/13/2017   Procedure: RIGHT TOTAL KNEE ARTHROPLASTY;  Surgeon: Paralee Cancel, MD;  Location: [facility];  Service: Orthopedics;  Laterality: Right;  70 mins  . UPPER GASTROINTESTINAL ENDOSCOPY    . VEIN SURGERY  1974   left leg   HPI:  80 y.o. female with a PMHx of A- fib on Xarelto, hyperlipidemia, hypertension, MVP, CVA* 2 (2013, 2014) no baseline neurological deficits, breast cancer s/p bilateral mastectomy 2011, polymyalgia rheumatica, GERD, colon polyps, s/p hysterectomy, hip and total knee replacement who presented to [facility] after acute onset of slurred speech and left sided weakness.  Dx R MCA embolism, S/P complete revascularization 2/10.   Assessment / Plan / Recommendation Clinical Impression  Pt presents with cognitive changes marked by inattention, poor awareness, pragmatic deficits related to turn-taking and social nuances, which her daughter describes as being partially present at baseline but now with some exacerbation. Orientation, short-term recall, reading and recall of paragraph information is WNL.  Recommend SLP f/u to address the deficits described above - OT/PT evals pending.    SLP Assessment  SLP Recommendation/Assessment: Patient needs continued Speech Lanaguage Pathology Services SLP Visit Diagnosis: Cognitive communication deficit (R41.841)    Follow Up Recommendations  Inpatient Rehab    Frequency and Duration min 2x/week  1 week      SLP Evaluation Cognition  Overall Cognitive Status: Impaired/Different from baseline Arousal/Alertness: Awake/alert Orientation Level: Oriented X4 Attention: Sustained Sustained Attention: Impaired Sustained Attention Impairment: Verbal basic Memory: Appears intact Awareness: Appears intact Problem Solving: Impaired Behaviors: Impulsive Safety/Judgment: Impaired        Comprehension  Auditory Comprehension Overall Auditory Comprehension: Appears within functional limits for tasks assessed Visual Recognition/Discrimination Discrimination: Within Function Limits Reading Comprehension Reading Status: Within funtional limits    Expression Expression Primary Mode of Expression: Verbal Verbal Expression Initiation: No impairment Level of Generative/Spontaneous Verbalization: Conversation Pragmatics: Impairment Impairments: Eye contact;Topic maintenance Written Expression Dominant Hand: Left Written Expression: Exceptions to [facility]   Oral / Motor  Oral Motor/Sensory Function Overall Oral Motor/Sensory Function: Within functional limits Motor Speech Overall Motor Speech: Appears within functional limits for tasks assessed   GO                    Juan Quam Laurice 04/25/2020, 11:10 AM  Estill Bamberg L. Tivis Ringer, [place] Office number [phone] Pager [phone]

## 2020-04-25 NOTE — Progress Notes (Addendum)
STROKE TEAM PROGRESS NOTE   INTERVAL HISTORY Patient is evaluated at the bedside.  Daughter is present in the room.  Patient and daughter confirmed that patient took Xarelto last on Monday evening and had her colonoscopy on Wednesday afternoon.  She is off Xarelto since then.  Patient is doing good, denies any complaints.  He states that her symptoms have improved.  On neurological examination, patient is alert and oriented x4.  No speech deficits appreciated, mild weakness observed in left LE.  S/p mechanical thrombectomy of right MCA distal M1 occlusion achieving revascularization by Dr. Estanislado Pandy.  MRI brain this morning showed relatively small acute cortical infarct in the posterior lateral right temporal lobe, and scattered additional punctate infarcts in the right lentiform, perirolandic cortex with Underlying chronic ischemic disease in the bilateral MCA and left PICA territories. MRA is negative for large vessel occlusion or stenosis.  ECHO showed LVEF of 65 to 70%, right atrial size mildly dilated, trace MVR. PT, OT pending.  Patient passed swallow evaluation.  IV fluids stopped.  Patient can be transferred out of ICU.  Discussed with patient about starting Eliquis possible tomorrow afternoon.   Vitals:   04/25/20 0500 04/25/20 0600 04/25/20 0700 04/25/20 0800  BP: 110/66 136/68 126/70 (!) 114/96  Pulse: (!) 53 (!) 59 62 70  Resp: 16 12 (!) 28 15  Temp:    98.1 F (36.7 C)  TempSrc:    Axillary  SpO2: 94% 97% 93% 95%  Weight:      Height:       CBC:  Recent Labs  Lab 04/24/20 1220 04/24/20 1245 04/25/20 0446  WBC 9.8  --  4.8  NEUTROABS 8.5*  --  3.9  HGB 13.1 12.9 12.2  HCT 42.2 38.0 36.9  MCV 97.9  --  95.6  PLT 288  --  716   Basic Metabolic Panel:  Recent Labs  Lab 04/24/20 1220 04/24/20 1245 04/25/20 0446  NA 140 139 141  K 3.8 3.7 3.9  CL 106 104 108  CO2 24  --  23  GLUCOSE 165* 166* 114*  BUN 14 15 11   CREATININE 0.94 0.80 0.71  CALCIUM 9.0  --  8.6*    Lipid Panel:  Recent Labs  Lab 04/25/20 0446  CHOL 154  TRIG 59  HDL 83  CHOLHDL 1.9  VLDL 12  LDLCALC 59   HgbA1c:  Recent Labs  Lab 04/25/20 0446  HGBA1C 5.8*   Urine Drug Screen:  Recent Labs  Lab 04/24/20 1624  LABOPIA NONE DETECTED  COCAINSCRNUR NONE DETECTED  LABBENZ NONE DETECTED  AMPHETMU NONE DETECTED  THCU NONE DETECTED  LABBARB NONE DETECTED    Alcohol Level No results for input(s): ETH in the last 168 hours.  IMAGING past 24 hours MR ANGIO HEAD WO CONTRAST  Result Date: 04/25/2020 CLINICAL DATA:  80 year old female code stroke presentation. Right MCA territory infarct, right MCA bifurcation LVO. Status post endovascular revascularization. EXAM: MRI HEAD WITHOUT CONTRAST MRA HEAD WITHOUT CONTRAST TECHNIQUE: Multiplanar, multiecho pulse sequences of the brain and surrounding structures were obtained without intravenous contrast. Angiographic images of the head were obtained using MRA technique without contrast. COMPARISON:  CTA head and neck, CT head yesterday. Brain MRI and intracranial MRA 11/05/2013. FINDINGS: MRI HEAD FINDINGS Brain: Cortical restricted diffusion in a roughly 3.5 cm area of the posterior right temporal lobe, junction with the lateral occipital lobe (series 9, image 76). Punctate diffusion restriction at the posterior right lentiform on image 77. And additional punctate  scattered cortical infarcts in the right operculum and perirolandic cortex (images 88, 94, 96). Mild associated cytotoxic edema. No convincing acute hemorrhage. No mass effect. No contralateral left hemisphere or posterior fossa diffusion restriction. Superimposed chronic post ischemic encephalomalacia in the posterior right MCA/PCA watershed and posterior left PCA territory similar to but progressed since 2015. Stable chronic left cerebellar PICA territory infarct. Minimal to mild superimposed additional nonspecific white matter signal changes and T2 heterogeneity in the deep gray  nuclei. No midline shift, mass effect, evidence of mass lesion, ventriculomegaly, extra-axial collection. Cervicomedullary junction and pituitary are within normal limits. Vascular: Major intracranial vascular flow voids appear stable since 2015. Skull and upper cervical spine: Mild degenerative spondylolisthesis in the cervical spine. Visualized bone marrow signal is within normal limits. Sinuses/Orbits: Stable, negative. Other: Trace left mastoid effusion is new as since 2015, where mild right mastoid effusion at that time has resolved. Grossly negative face and scalp soft tissues. MRA HEAD FINDINGS Antegrade flow in the posterior circulation. Codominant distal vertebral arteries appear stable since 2015 without significant stenosis. Bilateral PICA origins remain patent. Patent basilar artery, SCA and right PCA origins. Fetal left PCA origin redemonstrated. Right posterior communicating artery diminutive or absent. Bilateral PCA branches remain within normal limits. Tortuous left P1 segment. Highly tortuous distal cervical ICAs again noted. Antegrade flow in both ICA siphons is symmetric and appears stable compared to 2015. Ophthalmic and left posterior communicating artery origin are stable. No ICA siphon stenosis. Patent carotid termini. MCA and ACA origins remain patent. Mild irregularity has developed at the right ACA origin compared to 2015. Diminutive anterior communicating artery is stable. Visible ACA branches are stable. Left MCA bifurcates early without stenosis. Visible left MCA branches are stable. Right MCA M1 segment and bifurcation are patent. Mild irregularity at the origin of the dominant posterior right MCA M2 branch (series 1066, image 4) is new. But otherwise the right MCA branches appear patent and stable. IMPRESSION: 1. Relatively small acute cortical infarct in the posterolateral right temporal lobe, and scattered additional punctate infarcts in the right lentiform, perirolandic cortex. No  associated hemorrhage or mass effect. 2. Underlying chronic ischemic disease in the bilateral MCA and left PICA territories. 3. Intracranial MRA is negative for large vessel occlusion or hemodynamically significant stenosis. Mild new irregularity since 2015 at the right ACA and dominant right posterior M2 branch origins. Electronically Signed   By: Genevie Ann M.D.   On: 04/25/2020 04:36   MR BRAIN WO CONTRAST  Result Date: 04/25/2020 CLINICAL DATA:  80 year old female code stroke presentation. Right MCA territory infarct, right MCA bifurcation LVO. Status post endovascular revascularization. EXAM: MRI HEAD WITHOUT CONTRAST MRA HEAD WITHOUT CONTRAST TECHNIQUE: Multiplanar, multiecho pulse sequences of the brain and surrounding structures were obtained without intravenous contrast. Angiographic images of the head were obtained using MRA technique without contrast. COMPARISON:  CTA head and neck, CT head yesterday. Brain MRI and intracranial MRA 11/05/2013. FINDINGS: MRI HEAD FINDINGS Brain: Cortical restricted diffusion in a roughly 3.5 cm area of the posterior right temporal lobe, junction with the lateral occipital lobe (series 9, image 76). Punctate diffusion restriction at the posterior right lentiform on image 77. And additional punctate scattered cortical infarcts in the right operculum and perirolandic cortex (images 88, 94, 96). Mild associated cytotoxic edema. No convincing acute hemorrhage. No mass effect. No contralateral left hemisphere or posterior fossa diffusion restriction. Superimposed chronic post ischemic encephalomalacia in the posterior right MCA/PCA watershed and posterior left PCA territory similar to but progressed  since 2015. Stable chronic left cerebellar PICA territory infarct. Minimal to mild superimposed additional nonspecific white matter signal changes and T2 heterogeneity in the deep gray nuclei. No midline shift, mass effect, evidence of mass lesion, ventriculomegaly, extra-axial  collection. Cervicomedullary junction and pituitary are within normal limits. Vascular: Major intracranial vascular flow voids appear stable since 2015. Skull and upper cervical spine: Mild degenerative spondylolisthesis in the cervical spine. Visualized bone marrow signal is within normal limits. Sinuses/Orbits: Stable, negative. Other: Trace left mastoid effusion is new as since 2015, where mild right mastoid effusion at that time has resolved. Grossly negative face and scalp soft tissues. MRA HEAD FINDINGS Antegrade flow in the posterior circulation. Codominant distal vertebral arteries appear stable since 2015 without significant stenosis. Bilateral PICA origins remain patent. Patent basilar artery, SCA and right PCA origins. Fetal left PCA origin redemonstrated. Right posterior communicating artery diminutive or absent. Bilateral PCA branches remain within normal limits. Tortuous left P1 segment. Highly tortuous distal cervical ICAs again noted. Antegrade flow in both ICA siphons is symmetric and appears stable compared to 2015. Ophthalmic and left posterior communicating artery origin are stable. No ICA siphon stenosis. Patent carotid termini. MCA and ACA origins remain patent. Mild irregularity has developed at the right ACA origin compared to 2015. Diminutive anterior communicating artery is stable. Visible ACA branches are stable. Left MCA bifurcates early without stenosis. Visible left MCA branches are stable. Right MCA M1 segment and bifurcation are patent. Mild irregularity at the origin of the dominant posterior right MCA M2 branch (series 1066, image 4) is new. But otherwise the right MCA branches appear patent and stable. IMPRESSION: 1. Relatively small acute cortical infarct in the posterolateral right temporal lobe, and scattered additional punctate infarcts in the right lentiform, perirolandic cortex. No associated hemorrhage or mass effect. 2. Underlying chronic ischemic disease in the bilateral  MCA and left PICA territories. 3. Intracranial MRA is negative for large vessel occlusion or hemodynamically significant stenosis. Mild new irregularity since 2015 at the right ACA and dominant right posterior M2 branch origins. Electronically Signed   By: Genevie Ann M.D.   On: 04/25/2020 04:36   CT HEAD CODE STROKE WO CONTRAST  Result Date: 04/24/2020 CLINICAL DATA:  Code stroke.  Left-sided weakness EXAM: CT HEAD WITHOUT CONTRAST CT ANGIOGRAPHY OF THE HEAD AND NECK TECHNIQUE: Contiguous axial images were obtained from the base of the skull through the vertex without intravenous contrast. Multidetector CT imaging of the head and neck was performed using the standard protocol during bolus administration of intravenous contrast. Multiplanar CT image reconstructions and MIPs were obtained to evaluate the vascular anatomy. Carotid stenosis measurements (when applicable) are obtained utilizing NASCET criteria, using the distal internal carotid diameter as the denominator. CONTRAST:  50mL OMNIPAQUE IOHEXOL 350 MG/ML SOLN COMPARISON:  December 2019 head CT, 2015 MRA FINDINGS: CT HEAD Brain: There is no acute intracranial hemorrhage. Suspected loss of gray-white differentiation in the right temporal lobe and posterior insula. Chronic infarcts of the parietal lobes bilaterally. Additional patchy hypoattenuation in the supratentorial white matter probably reflects mild chronic microvascular ischemic changes. There is no hydrocephalus or extra-axial collection. Vascular: Hyperdensity at the right MCA bifurcation. Skull: Calvarium is unremarkable. Sinuses/Orbits: No acute finding. Other: None. ASPECTS ([place] Stroke Program Early CT Score) - Ganglionic level infarction (caudate, lentiform nuclei, internal capsule, insula, M1-M3 cortex): 5 - Supraganglionic infarction (M4-M6 cortex): 3 Total score (0-10 with 10 being normal): 8 Review of the MIP images confirms the above findings CTA NECK Motion artifact  is present. Aortic  arch: Great vessel origins are patent. Right carotid system: Patent. No measurable stenosis at the ICA origin. Left carotid system: Patent. No measurable stenosis at the ICA origin. Vertebral arteries: Patent. Skeleton: Advanced degenerative changes of the cervical spine. Other neck: Negative within above limitation. Upper chest: Negative within above limitation. Review of the MIP images confirms the above findings CTA HEAD Anterior circulation: Intracranial internal carotid arteries are patent with minimal calcified plaque. Right M1 MCA is patent. There is thrombus at the MCA bifurcation occluding one of the branch origins (correlating with prior MRA) and partially occluding the other. Both anterior and left middle cerebral arteries are patent. 1 mm inferiorly directed outpouching from the distal supraclinoid right ICA at the expected origin of a posterior communicating artery. Posterior circulation: Intracranial vertebral arteries, basilar artery, and posterior cerebral arteries are patent. There is fetal origin of the left PCA. Venous sinuses: Patent as allowed by contrast bolus timing. Review of the MIP images confirms the above findings IMPRESSION: No acute intracranial hemorrhage. Acute infarction involving right temporal lobe and posterior insula. ASPECT score is 8. Thrombus at the right MCA bifurcation occluding one branch origin and partially occluding the other. 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 Significant motion artifact through the neck. No hemodynamically significant stenosis identified. Initial results were called by telephone at the time of interpretation on 04/24/2020 at 12:55 pm to provider MATTHEW TRIFAN ; [facility] , who verbally acknowledged hese results. Electronically Signed   By: Macy Mis M.D.   On: 04/24/2020 13:06   CT ANGIO HEAD CODE STROKE  Result Date: 04/24/2020 CLINICAL DATA:  Code stroke.  Left-sided weakness EXAM: CT  HEAD WITHOUT CONTRAST CT ANGIOGRAPHY OF THE HEAD AND NECK TECHNIQUE: Contiguous axial images were obtained from the base of the skull through the vertex without intravenous contrast. Multidetector CT imaging of the head and neck was performed using the standard protocol during bolus administration of intravenous contrast. Multiplanar CT image reconstructions and MIPs were obtained to evaluate the vascular anatomy. Carotid stenosis measurements (when applicable) are obtained utilizing NASCET criteria, using the distal internal carotid diameter as the denominator. CONTRAST:  60mL OMNIPAQUE IOHEXOL 350 MG/ML SOLN COMPARISON:  December 2019 head CT, 2015 MRA FINDINGS: CT HEAD Brain: There is no acute intracranial hemorrhage. Suspected loss of gray-white differentiation in the right temporal lobe and posterior insula. Chronic infarcts of the parietal lobes bilaterally. Additional patchy hypoattenuation in the supratentorial white matter probably reflects mild chronic microvascular ischemic changes. There is no hydrocephalus or extra-axial collection. Vascular: Hyperdensity at the right MCA bifurcation. Skull: Calvarium is unremarkable. Sinuses/Orbits: No acute finding. Other: None. ASPECTS ([place] Stroke Program Early CT Score) - Ganglionic level infarction (caudate, lentiform nuclei, internal capsule, insula, M1-M3 cortex): 5 - Supraganglionic infarction (M4-M6 cortex): 3 Total score (0-10 with 10 being normal): 8 Review of the MIP images confirms the above findings CTA NECK Motion artifact is present. Aortic arch: Great vessel origins are patent. Right carotid system: Patent. No measurable stenosis at the ICA origin. Left carotid system: Patent. No measurable stenosis at the ICA origin. Vertebral arteries: Patent. Skeleton: Advanced degenerative changes of the cervical spine. Other neck: Negative within above limitation. Upper chest: Negative within above limitation. Review of the MIP images confirms the above  findings CTA HEAD Anterior circulation: Intracranial internal carotid arteries are patent with minimal calcified plaque. Right M1 MCA is patent. There is thrombus at the MCA bifurcation occluding one of  the branch origins (correlating with prior MRA) and partially occluding the other. Both anterior and left middle cerebral arteries are patent. 1 mm inferiorly directed outpouching from the distal supraclinoid right ICA at the expected origin of a posterior communicating artery. Posterior circulation: Intracranial vertebral arteries, basilar artery, and posterior cerebral arteries are patent. There is fetal origin of the left PCA. Venous sinuses: Patent as allowed by contrast bolus timing. Review of the MIP images confirms the above findings IMPRESSION: No acute intracranial hemorrhage. Acute infarction involving right temporal lobe and posterior insula. ASPECT score is 8. Thrombus at the right MCA bifurcation occluding one branch origin and partially occluding the other. 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 Significant motion artifact through the neck. No hemodynamically significant stenosis identified. Initial results were called by telephone at the time of interpretation on 04/24/2020 at 12:55 pm to provider MATTHEW TRIFAN ; [facility] , who verbally acknowledged hese results. Electronically Signed   By: Macy Mis M.D.   On: 04/24/2020 13:06   CT ANGIO NECK CODE STROKE  Result Date: 04/24/2020 CLINICAL DATA:  Code stroke.  Left-sided weakness EXAM: CT HEAD WITHOUT CONTRAST CT ANGIOGRAPHY OF THE HEAD AND NECK TECHNIQUE: Contiguous axial images were obtained from the base of the skull through the vertex without intravenous contrast. Multidetector CT imaging of the head and neck was performed using the standard protocol during bolus administration of intravenous contrast. Multiplanar CT image reconstructions and MIPs were obtained to evaluate the vascular  anatomy. Carotid stenosis measurements (when applicable) are obtained utilizing NASCET criteria, using the distal internal carotid diameter as the denominator. CONTRAST:  8mL OMNIPAQUE IOHEXOL 350 MG/ML SOLN COMPARISON:  December 2019 head CT, 2015 MRA FINDINGS: CT HEAD Brain: There is no acute intracranial hemorrhage. Suspected loss of gray-white differentiation in the right temporal lobe and posterior insula. Chronic infarcts of the parietal lobes bilaterally. Additional patchy hypoattenuation in the supratentorial white matter probably reflects mild chronic microvascular ischemic changes. There is no hydrocephalus or extra-axial collection. Vascular: Hyperdensity at the right MCA bifurcation. Skull: Calvarium is unremarkable. Sinuses/Orbits: No acute finding. Other: None. ASPECTS ([place] Stroke Program Early CT Score) - Ganglionic level infarction (caudate, lentiform nuclei, internal capsule, insula, M1-M3 cortex): 5 - Supraganglionic infarction (M4-M6 cortex): 3 Total score (0-10 with 10 being normal): 8 Review of the MIP images confirms the above findings CTA NECK Motion artifact is present. Aortic arch: Great vessel origins are patent. Right carotid system: Patent. No measurable stenosis at the ICA origin. Left carotid system: Patent. No measurable stenosis at the ICA origin. Vertebral arteries: Patent. Skeleton: Advanced degenerative changes of the cervical spine. Other neck: Negative within above limitation. Upper chest: Negative within above limitation. Review of the MIP images confirms the above findings CTA HEAD Anterior circulation: Intracranial internal carotid arteries are patent with minimal calcified plaque. Right M1 MCA is patent. There is thrombus at the MCA bifurcation occluding one of the branch origins (correlating with prior MRA) and partially occluding the other. Both anterior and left middle cerebral arteries are patent. 1 mm inferiorly directed outpouching from the distal supraclinoid  right ICA at the expected origin of a posterior communicating artery. Posterior circulation: Intracranial vertebral arteries, basilar artery, and posterior cerebral arteries are patent. There is fetal origin of the left PCA. Venous sinuses: Patent as allowed by contrast bolus timing. Review of the MIP images confirms the above findings IMPRESSION: No acute intracranial hemorrhage. Acute infarction involving right temporal lobe and posterior  insula. ASPECT score is 8. Thrombus at the right MCA bifurcation occluding one branch origin and partially occluding the other. 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 Significant motion artifact through the neck. No hemodynamically significant stenosis identified. Initial results were called by telephone at the time of interpretation on 04/24/2020 at 12:55 pm to provider MATTHEW TRIFAN ; [facility] , who verbally acknowledged hese results. Electronically Signed   By: Macy Mis M.D.   On: 04/24/2020 13:06    PHYSICAL EXAM GENERAL: Awake, alert in NAD HEENT: - Normocephalic and atraumatic LUNGS - Symmetrical chest rise, No labored breathing noted CV - no JVD, No Peripheral Edema, Normal rate and rhythm on monitor.  ABDOMEN - Soft,  nondistended  Ext: warm, well perfused, intact peripheral pulses, no Peripheral edema  NEURO Exam:   Mental Status: AA&Ox4, Oriented to self, age, place, situation. Good Attention and Concentration Language: speech is fluent. Naming, repetition intact,  fluency, and comprehension intact Cranial Nerves:   CN II Pupils equal and reactive to light, no VF deficits    CN III,IV,VI EOM intact, no gaze preference or deviation, no nystagmus    CN V normal sensation in V1, V2, and V3 segments bilaterally    CN VII no asymmetry, no nasolabial fold flattening    CN VIII normal hearing to speech    CN IX & X normal palatal elevation, no uvular deviation    CN XI 5/5 head turn and 5/5  shoulder shrug bilaterally    CN XII midline tongue protrusion    Motor: No Drift in Upper Extremities, No Drift in LE's. Strength 5/5 in Rt UE, 5/5 in Lt UE, Strength in Rt LE  4/5, Lt LE 5/5 Tone: is normal and bulk is normal Sensation- Intact to light touch bilaterally Coordination: FTN intact bilaterally, no ataxia. Gait- deferred   ASSESSMENT/PLAN Felicia Hunter is a 80 y.o. female with history of with A- fib on Xarelto (off since 04/22/20, Last dose on 04/21/20) , hyperlipidemia, hypertension, MVP, CVA* 2 (2013, 2014) no baseline neurological deficits, breast cancer s/p bilateral mastectomy 2011, polymyalgia rheumatica, GERD, colon polyps, s/p hysterectomy, hip and total knee replacement who presented to [facility] after acute onset of slurred speech and left sided weakness. CT showed acute infarction involving right temporal lobe and posterior insula.CTA Head and Neck showed thrombus at the right MCA bifurcation occluding one branch origin and partially occluding the other. S/P mechanical thrombectomy of right MCA distal M1 occlusion achieving revascularization by Dr. Estanislado Pandy.  MRI brain (04/25/20) showed relatively small acute cortical infarct in the posterior lateral right temporal lobe, and scattered additional punctate infarcts in the right lentiform, perirolandic cortex with Underlying chronic ischemic disease in the bilateral MCA and left PICA territories. MRA is negative for large vessel occlusion or stenosis.  ECHO showed LVEF of 65 to 70%, right atrial size mildly dilated, trace MVR. PT/OT pending.   Stroke - Acute right MCA infarct due to thrombus in the right MCA bifurcation, S/P IR with TICI2C revascularization - Etilogy likely due to PAF off Xeralto   CT Code Stroke No acute intracranial hemorrhage. Acute infarction involving right temporal lobe and posterior insula. ASPECT score is 8.  CTA Head and Neck- Thrombus at the right MCA bifurcation occluding one branch origin and partially  occluding the other.   MRI (04/25/20)- Relatively small acute cortical infarct in the posterolateral right temporal lobe, and scattered additional punctate infarcts in the right lentiform, perirolandic  cortex. No associated hemorrhage or mass effect.   MRA- Intracranial MRA is negative for large vessel occlusion.  2D Echo-  LVEF of 65 to 70%, right atrial size mildly dilated, trace MVR.  LDL 59  HgbA1c 5.8  VTE prophylaxis - SCDs  Xarelto (rivaroxaban) daily prior to admission (off 3 days for colonoscopy), now on aspirin 325 mg daily. Consider to start eliquis tomorrow afternoon    Therapy recommendations:  outpt PT  Disposition:  TBD  Atrial Fibrillation  Was on Xeralto until Monday. Stopped Xeralto for Colonoscopy. Off since then.  Loop recorder diagnosed afib  Pt had Loop recorder which was removed on 05/2017.   Currently Normal sinus rhythm on monitor.   Discussed with Pt about starting Eliquis. Pt agrees.   We'll start Eliquis tomorrow afternoon.   History of stroke  10/2013 admitted for left MCA stroke.  MRI also showed previous chronic left cerebellum, right parietal cortex infarct.  TEE negative. Enrolled in respect ESUS trial.  Loop recorder placed in 2016 and found A. fib around 2018.  Started on Xarelto.  Hypertension  Home meds:  None  Stable  BP goal systolic <147 mmHg.  . Off claviprex . D/c IV fluids.  . Long-term BP goal normotensive  Hyperlipidemia  Home meds:  Lipitor 10 mg, resumed in hospital  LDL 59, goal < 70  No need of high intensity statin due to LDL at goal with current statin dose  Continue statin at discharge  Other Stroke Risk Factors  Advanced Age > 25   Other Active Problems  Polymyalgia rheumatica- Stable. Pt is on  Prednisone 4 mg daily.   Hospital day # 1  This patient is critically ill due to right MCA stroke status post thrombectomy, PAF and at significant risk of neurological worsening, death form recurrent  stroke, hemorrhagic conversion, heart failure. This patient's care requires constant monitoring of vital signs, hemodynamics, respiratory and cardiac monitoring, review of multiple databases, neurological assessment, discussion with family, other specialists and medical decision making of high complexity. I spent 35 minutes of neurocritical care time in the care of this patient. I had long discussion with patient and daughter at bedside, updated pt current condition, treatment plan and potential prognosis, and answered all the questions.  They expressed understanding and appreciation.    Rosalin Hawking, MD PhD Stroke Neurology 04/25/2020 8:31 PM    To contact Stroke Continuity provider, please refer to [email or web address] After hours, contact General Neurology

## 2020-04-25 NOTE — Telephone Encounter (Signed)
  Follow up Call-  Call back number 04/23/2020  Post procedure Call Back phone  # [phone] hm  Permission to leave phone message Yes  Some recent data might be hidden     Patient questions:  Do you have a fever, pain , or abdominal swelling? No. Pain Score  0 *  Have you tolerated food without any problems? Yes.    Have you been able to return to your normal activities? No.  Do you have any questions about your discharge instructions: Diet   No. Medications  No. Follow up visit  No.  Do you have questions or concerns about your Care? No.  Actions: * If pain score is 4 or above: No action needed, pain <4. 1. Have you developed a fever since your procedure? no  2.   Have you had an respiratory symptoms (SOB or cough) since your procedure? no  3.   Have you tested positive for COVID 19 since your procedure no  4.   Have you had any family members/close contacts diagnosed with the COVID 19 since your procedure?  No   Dr. Fuller Plan,  Spoke with this patient during our routine follow up and she was admitted to [facility] yesterday, Feb 10 for a stroke.  She woke up yesterday and felt fine but later started having " an episode".  She went to the ER where the doctors found a clot on her brain and suspect that it was due to her being off of her Xarelto.  She is supposed to be released tomorrow. Patient states that the doctors may change her blood thinner to Eliquis.      If yes to any of these questions please route to Joylene John, RN and Joella Prince, RN

## 2020-04-25 NOTE — TOC Initial Note (Signed)
Transition of Care [facility]) - Initial/Assessment Note    Patient Details  Name: Felicia Hunter MRN: [id number] Date of Birth: 11/17/1940  [facility] CM/SW Contact:    Ella Bodo, RN Phone Number: 04/25/2020, 3:37 PM  Clinical Narrative:   80 yo female presenting with ED on 2/10 with left sided weakness, slurred speech, and R gaze. MRI showing small acute cortical infarct in the posterolateral R temporal lobe and scattered puncate infarcts in R lentiform, perirolandic cortex. s/p R common carotid arteriogram via R CFA approach with complete revascularization of occluded R MCA on 2/10.  PTA, pt independent; lives at home alone.  Boyfriend and family able to assist with care intermittently.  PT/OT recommending OP therapy follow up.  ST recommending CIR.  Will make referrals to [facility] for follow up.  Rehab center will call patient for appointments.                 Expected Discharge Plan: OP Rehab Barriers to Discharge: Continued Medical Work up          Expected Discharge Plan and Services Expected Discharge Plan: OP Rehab   Discharge Planning Services: CM Consult   Living arrangements for the past 2 months: Single Family Home                                      Prior Living Arrangements/Services Living arrangements for the past 2 months: Single Family Home Lives with:: Self Patient language and need for interpreter reviewed:: Yes        Need for Family Participation in Patient Care: Yes (Comment) Care giver support system in place?: Yes (comment)   Criminal Activity/Legal Involvement Pertinent to Current Situation/Hospitalization: No - Comment as needed                 Emotional Assessment Appearance:: Appears stated age Attitude/Demeanor/Rapport: Engaged Affect (typically observed): Accepting Orientation: : Oriented to Self,Oriented to Place,Oriented to  Time,Oriented to Situation      Admission diagnosis:  Stroke [facility])  [I63.9] Stroke (cerebrum) ([place]) [I63.9] Middle cerebral artery embolism, right [I66.01] Patient Active Problem List   Diagnosis Date Noted  . Stroke (cerebrum) ([place]) 04/24/2020  . Middle cerebral artery embolism, right 04/24/2020  . Chronic cough 08/08/2018  . Solitary pulmonary nodule 03/30/2018  . S/P right TKA 09/13/2017  . S/P total knee replacement 09/13/2017  . Right leg pain 03/01/2016  . S/P left THA, AA 12/31/2014  . Cerebral infarction due to embolism of precerebral artery ([place]) 12/20/2013  . History of stroke 12/20/2013  . Expressive aphasia 11/05/2013  . Expected blood loss anemia 04/11/2013  . Hyponatremia 04/11/2013  . Other and unspecified hyperlipidemia 06/15/2012  . Lacunar infarction ([place]) 06/15/2012  . CVA (cerebral infarction)-mid/inf cerebellar infarct 3/25 06/08/2011  . Near syncope 06/07/2011  . 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 DEPRESSION 10/28/2006  . ALLERGIC RHINITIS 10/28/2006  . ASTHMA 10/28/2006   PCP:  Deland Pretty, MD Pharmacy:   [place], [place] [address] Phone: [phone] Fax: [phone]     Social Determinants of Health (SDOH) Interventions    Readmission Risk Interventions No flowsheet data found.  Reinaldo Raddle, RN, BSN  Trauma/Neuro ICU Case Manager [phone]

## 2020-04-25 NOTE — Progress Notes (Signed)
Referring Physician(s): Code stroke- Jeni Salles (neurology)  Supervising Physician: Luanne Bras  Patient Status:  [facility] - In-pt  Chief Complaint: Stroke follow-up  Subjective:  History of acute CVA s/p cerebral arteriogram with emergent mechanical thrombectomy of right MCA distal M1 occlusion achieving a TICI 3 revascularization via right femoral approach 04/24/2020 by Dr. Estanislado Pandy. Patient awake and alert sitting in bed with no complaints at this time. Daughter at bedside. Can spontaneously move all extremities. Right femoral puncture site c/d/i.  MR/MRA brain/head this AM: 1. Relatively small acute cortical infarct in the posterolateral right temporal lobe, and scattered additional punctate infarcts in the right lentiform, perirolandic cortex. No associated hemorrhage or mass effect. 2. Underlying chronic ischemic disease in the bilateral MCA and left PICA territories. 3. Intracranial MRA is negative for large vessel occlusion or hemodynamically significant stenosis. 4. Mild new irregularity since 2015 at the right ACA and dominant right posterior M2 branch origins.   Allergies: Sulfonamide derivatives and Sulfacetamide sodium-sulfur  Medications: Prior to Admission medications   Medication Sig Start Date End Date Taking? Authorizing Provider  acetaminophen (TYLENOL) 500 MG tablet Take 1,000 mg by mouth as needed for mild pain.   Yes [provider]  alendronate (FOSAMAX) 70 MG tablet Take 70 mg by mouth every Sunday.  11/24/15  Yes [provider]  atorvastatin (LIPITOR) 10 MG tablet Take 10 mg by mouth daily. 03/18/20  Yes [provider]  BIOTIN PO Take 1 tablet by mouth daily.   Yes [provider]  Calcium Carb-Cholecalciferol (CALCIUM 600/VITAMIN D3 PO) Take 1 tablet by mouth daily.   Yes [provider]  cholecalciferol (VITAMIN D) 1000 UNITS tablet Take 1,000 Units by mouth daily.   Yes [provider]   Multiple Vitamin (MULTIVITAMIN) capsule Take 1 capsule by mouth daily.   Yes [provider]  Omega-3 Fatty Acids (FISH OIL PO) Take 1 capsule by mouth 2 (two) times daily.   Yes [provider]  polyvinyl alcohol (LIQUIFILM TEARS) 1.4 % ophthalmic solution Place 1 drop into both eyes as needed for dry eyes.    Yes [provider]  predniSONE (DELTASONE) 1 MG tablet Take 2 mg by mouth daily with breakfast.   Yes [provider]  predniSONE (DELTASONE) 5 MG tablet Take 5 mg by mouth daily. 04/24/19  Yes [provider]  rivaroxaban (XARELTO) 20 MG TABS tablet Take 1 tablet (20 mg total) by mouth daily with supper. 07/20/19  Yes Shirley Friar, PA-C  vitamin C (ASCORBIC ACID) 500 MG tablet Take 500 mg by mouth daily as needed (for cold symptoms).    Yes [provider]  zolpidem (AMBIEN) 10 MG tablet Take 10 mg by mouth at bedtime as needed for sleep.  01/05/16  Yes [provider]     Vital Signs: BP (!) 114/96   Pulse 70   Temp 98.1 F (36.7 C) (Axillary)   Resp 15   Ht 5\' 5"  (1.651 m)   Wt 136 lb 11 oz (62 kg)   SpO2 95%   BMI 22.75 kg/m   Physical Exam Vitals and nursing note reviewed.  Constitutional:      General: She is not in acute distress.    Appearance: Normal appearance.  Pulmonary:     Effort: Pulmonary effort is normal. No respiratory distress.  Skin:    General: Skin is warm and dry.     Comments: Right femoral puncture site soft without active bleeding or hematoma.  Neurological:     Mental Status: She is alert.     Comments: Alert, awake, and oriented x3. Speech and comprehension intact. PERRL bilaterally. Can spontaneously move all extremities. No pronator drift. Fine motor and coordination intact and symmetric. Distal pulses (DPs) 2+ bilaterally.     Imaging: MR ANGIO HEAD WO CONTRAST  Result Date: 04/25/2020 CLINICAL DATA:  80 year old female code stroke presentation. Right MCA  territory infarct, right MCA bifurcation LVO. Status post endovascular revascularization. EXAM: MRI HEAD WITHOUT CONTRAST MRA HEAD WITHOUT CONTRAST TECHNIQUE: Multiplanar, multiecho pulse sequences of the brain and surrounding structures were obtained without intravenous contrast. Angiographic images of the head were obtained using MRA technique without contrast. COMPARISON:  CTA head and neck, CT head yesterday. Brain MRI and intracranial MRA 11/05/2013. FINDINGS: MRI HEAD FINDINGS Brain: Cortical restricted diffusion in a roughly 3.5 cm area of the posterior right temporal lobe, junction with the lateral occipital lobe (series 9, image 76). Punctate diffusion restriction at the posterior right lentiform on image 77. And additional punctate scattered cortical infarcts in the right operculum and perirolandic cortex (images 88, 94, 96). Mild associated cytotoxic edema. No convincing acute hemorrhage. No mass effect. No contralateral left hemisphere or posterior fossa diffusion restriction. Superimposed chronic post ischemic encephalomalacia in the posterior right MCA/PCA watershed and posterior left PCA territory similar to but progressed since 2015. Stable chronic left cerebellar PICA territory infarct. Minimal to mild superimposed additional nonspecific white matter signal changes and T2 heterogeneity in the deep gray nuclei. No midline shift, mass effect, evidence of mass lesion, ventriculomegaly, extra-axial collection. Cervicomedullary junction and pituitary are within normal limits. Vascular: Major intracranial vascular flow voids appear stable since 2015. Skull and upper cervical spine: Mild degenerative spondylolisthesis in the cervical spine. Visualized bone marrow signal is within normal limits. Sinuses/Orbits: Stable, negative. Other: Trace left mastoid effusion is new as since 2015, where mild right mastoid effusion at that time has resolved. Grossly negative face and scalp soft tissues. MRA HEAD FINDINGS  Antegrade flow in the posterior circulation. Codominant distal vertebral arteries appear stable since 2015 without significant stenosis. Bilateral PICA origins remain patent. Patent basilar artery, SCA and right PCA origins. Fetal left PCA origin redemonstrated. Right posterior communicating artery diminutive or absent. Bilateral PCA branches remain within normal limits. Tortuous left P1 segment. Highly tortuous distal cervical ICAs again noted. Antegrade flow in both ICA siphons is symmetric and appears stable compared to 2015. Ophthalmic and left posterior communicating artery origin are stable. No ICA siphon stenosis. Patent carotid termini. MCA and ACA origins remain patent. Mild irregularity has developed at the right ACA origin compared to 2015. Diminutive anterior communicating artery is stable. Visible ACA branches are stable. Left MCA bifurcates early without stenosis. Visible left MCA branches are stable. Right MCA M1 segment and bifurcation are patent. Mild irregularity at the origin of the dominant posterior right MCA M2 branch (series 1066, image 4) is new. But otherwise the right MCA branches appear patent and stable. IMPRESSION: 1. Relatively small acute cortical infarct in the posterolateral right temporal lobe, and scattered additional punctate infarcts in the right lentiform, perirolandic cortex. No associated hemorrhage or mass effect. 2. Underlying chronic ischemic disease in the bilateral MCA and left PICA territories. 3. Intracranial MRA is negative for large vessel occlusion or hemodynamically significant stenosis. Mild new irregularity since 2015 at the right ACA and dominant right posterior M2 branch origins. Electronically Signed   By: Genevie Ann M.D.   On: 04/25/2020 04:36   MR BRAIN  WO CONTRAST  Result Date: 04/25/2020 CLINICAL DATA:  80 year old female code stroke presentation. Right MCA territory infarct, right MCA bifurcation LVO. Status post endovascular revascularization. EXAM: MRI  HEAD WITHOUT CONTRAST MRA HEAD WITHOUT CONTRAST TECHNIQUE: Multiplanar, multiecho pulse sequences of the brain and surrounding structures were obtained without intravenous contrast. Angiographic images of the head were obtained using MRA technique without contrast. COMPARISON:  CTA head and neck, CT head yesterday. Brain MRI and intracranial MRA 11/05/2013. FINDINGS: MRI HEAD FINDINGS Brain: Cortical restricted diffusion in a roughly 3.5 cm area of the posterior right temporal lobe, junction with the lateral occipital lobe (series 9, image 76). Punctate diffusion restriction at the posterior right lentiform on image 77. And additional punctate scattered cortical infarcts in the right operculum and perirolandic cortex (images 88, 94, 96). Mild associated cytotoxic edema. No convincing acute hemorrhage. No mass effect. No contralateral left hemisphere or posterior fossa diffusion restriction. Superimposed chronic post ischemic encephalomalacia in the posterior right MCA/PCA watershed and posterior left PCA territory similar to but progressed since 2015. Stable chronic left cerebellar PICA territory infarct. Minimal to mild superimposed additional nonspecific white matter signal changes and T2 heterogeneity in the deep gray nuclei. No midline shift, mass effect, evidence of mass lesion, ventriculomegaly, extra-axial collection. Cervicomedullary junction and pituitary are within normal limits. Vascular: Major intracranial vascular flow voids appear stable since 2015. Skull and upper cervical spine: Mild degenerative spondylolisthesis in the cervical spine. Visualized bone marrow signal is within normal limits. Sinuses/Orbits: Stable, negative. Other: Trace left mastoid effusion is new as since 2015, where mild right mastoid effusion at that time has resolved. Grossly negative face and scalp soft tissues. MRA HEAD FINDINGS Antegrade flow in the posterior circulation. Codominant distal vertebral arteries appear stable  since 2015 without significant stenosis. Bilateral PICA origins remain patent. Patent basilar artery, SCA and right PCA origins. Fetal left PCA origin redemonstrated. Right posterior communicating artery diminutive or absent. Bilateral PCA branches remain within normal limits. Tortuous left P1 segment. Highly tortuous distal cervical ICAs again noted. Antegrade flow in both ICA siphons is symmetric and appears stable compared to 2015. Ophthalmic and left posterior communicating artery origin are stable. No ICA siphon stenosis. Patent carotid termini. MCA and ACA origins remain patent. Mild irregularity has developed at the right ACA origin compared to 2015. Diminutive anterior communicating artery is stable. Visible ACA branches are stable. Left MCA bifurcates early without stenosis. Visible left MCA branches are stable. Right MCA M1 segment and bifurcation are patent. Mild irregularity at the origin of the dominant posterior right MCA M2 branch (series 1066, image 4) is new. But otherwise the right MCA branches appear patent and stable. IMPRESSION: 1. Relatively small acute cortical infarct in the posterolateral right temporal lobe, and scattered additional punctate infarcts in the right lentiform, perirolandic cortex. No associated hemorrhage or mass effect. 2. Underlying chronic ischemic disease in the bilateral MCA and left PICA territories. 3. Intracranial MRA is negative for large vessel occlusion or hemodynamically significant stenosis. Mild new irregularity since 2015 at the right ACA and dominant right posterior M2 branch origins. Electronically Signed   By: Genevie Ann M.D.   On: 04/25/2020 04:36   CT HEAD CODE STROKE WO CONTRAST  Result Date: 04/24/2020 CLINICAL DATA:  Code stroke.  Left-sided weakness EXAM: CT HEAD WITHOUT CONTRAST CT ANGIOGRAPHY OF THE HEAD AND NECK TECHNIQUE: Contiguous axial images were obtained from the base of the skull through the vertex without intravenous contrast. Multidetector  CT imaging of the head and  neck was performed using the standard protocol during bolus administration of intravenous contrast. Multiplanar CT image reconstructions and MIPs were obtained to evaluate the vascular anatomy. Carotid stenosis measurements (when applicable) are obtained utilizing NASCET criteria, using the distal internal carotid diameter as the denominator. CONTRAST:  19mL OMNIPAQUE IOHEXOL 350 MG/ML SOLN COMPARISON:  December 2019 head CT, 2015 MRA FINDINGS: CT HEAD Brain: There is no acute intracranial hemorrhage. Suspected loss of gray-white differentiation in the right temporal lobe and posterior insula. Chronic infarcts of the parietal lobes bilaterally. Additional patchy hypoattenuation in the supratentorial white matter probably reflects mild chronic microvascular ischemic changes. There is no hydrocephalus or extra-axial collection. Vascular: Hyperdensity at the right MCA bifurcation. Skull: Calvarium is unremarkable. Sinuses/Orbits: No acute finding. Other: None. ASPECTS ([place] Stroke Program Early CT Score) - Ganglionic level infarction (caudate, lentiform nuclei, internal capsule, insula, M1-M3 cortex): 5 - Supraganglionic infarction (M4-M6 cortex): 3 Total score (0-10 with 10 being normal): 8 Review of the MIP images confirms the above findings CTA NECK Motion artifact is present. Aortic arch: Great vessel origins are patent. Right carotid system: Patent. No measurable stenosis at the ICA origin. Left carotid system: Patent. No measurable stenosis at the ICA origin. Vertebral arteries: Patent. Skeleton: Advanced degenerative changes of the cervical spine. Other neck: Negative within above limitation. Upper chest: Negative within above limitation. Review of the MIP images confirms the above findings CTA HEAD Anterior circulation: Intracranial internal carotid arteries are patent with minimal calcified plaque. Right M1 MCA is patent. There is thrombus at the MCA bifurcation occluding one of  the branch origins (correlating with prior MRA) and partially occluding the other. Both anterior and left middle cerebral arteries are patent. 1 mm inferiorly directed outpouching from the distal supraclinoid right ICA at the expected origin of a posterior communicating artery. Posterior circulation: Intracranial vertebral arteries, basilar artery, and posterior cerebral arteries are patent. There is fetal origin of the left PCA. Venous sinuses: Patent as allowed by contrast bolus timing. Review of the MIP images confirms the above findings IMPRESSION: No acute intracranial hemorrhage. Acute infarction involving right temporal lobe and posterior insula. ASPECT score is 8. Thrombus at the right MCA bifurcation occluding one branch origin and partially occluding the other. 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 Significant motion artifact through the neck. No hemodynamically significant stenosis identified. Initial results were called by telephone at the time of interpretation on 04/24/2020 at 12:55 pm to provider MATTHEW TRIFAN ; [facility] , who verbally acknowledged hese results. Electronically Signed   By: Macy Mis M.D.   On: 04/24/2020 13:06   CT ANGIO HEAD CODE STROKE  Result Date: 04/24/2020 CLINICAL DATA:  Code stroke.  Left-sided weakness EXAM: CT HEAD WITHOUT CONTRAST CT ANGIOGRAPHY OF THE HEAD AND NECK TECHNIQUE: Contiguous axial images were obtained from the base of the skull through the vertex without intravenous contrast. Multidetector CT imaging of the head and neck was performed using the standard protocol during bolus administration of intravenous contrast. Multiplanar CT image reconstructions and MIPs were obtained to evaluate the vascular anatomy. Carotid stenosis measurements (when applicable) are obtained utilizing NASCET criteria, using the distal internal carotid diameter as the denominator. CONTRAST:  22mL OMNIPAQUE IOHEXOL 350 MG/ML  SOLN COMPARISON:  December 2019 head CT, 2015 MRA FINDINGS: CT HEAD Brain: There is no acute intracranial hemorrhage. Suspected loss of gray-white differentiation in the right temporal lobe and posterior insula. Chronic infarcts of the parietal lobes bilaterally. Additional patchy  hypoattenuation in the supratentorial white matter probably reflects mild chronic microvascular ischemic changes. There is no hydrocephalus or extra-axial collection. Vascular: Hyperdensity at the right MCA bifurcation. Skull: Calvarium is unremarkable. Sinuses/Orbits: No acute finding. Other: None. ASPECTS ([place] Stroke Program Early CT Score) - Ganglionic level infarction (caudate, lentiform nuclei, internal capsule, insula, M1-M3 cortex): 5 - Supraganglionic infarction (M4-M6 cortex): 3 Total score (0-10 with 10 being normal): 8 Review of the MIP images confirms the above findings CTA NECK Motion artifact is present. Aortic arch: Great vessel origins are patent. Right carotid system: Patent. No measurable stenosis at the ICA origin. Left carotid system: Patent. No measurable stenosis at the ICA origin. Vertebral arteries: Patent. Skeleton: Advanced degenerative changes of the cervical spine. Other neck: Negative within above limitation. Upper chest: Negative within above limitation. Review of the MIP images confirms the above findings CTA HEAD Anterior circulation: Intracranial internal carotid arteries are patent with minimal calcified plaque. Right M1 MCA is patent. There is thrombus at the MCA bifurcation occluding one of the branch origins (correlating with prior MRA) and partially occluding the other. Both anterior and left middle cerebral arteries are patent. 1 mm inferiorly directed outpouching from the distal supraclinoid right ICA at the expected origin of a posterior communicating artery. Posterior circulation: Intracranial vertebral arteries, basilar artery, and posterior cerebral arteries are patent. There is fetal origin  of the left PCA. Venous sinuses: Patent as allowed by contrast bolus timing. Review of the MIP images confirms the above findings IMPRESSION: No acute intracranial hemorrhage. Acute infarction involving right temporal lobe and posterior insula. ASPECT score is 8. Thrombus at the right MCA bifurcation occluding one branch origin and partially occluding the other. 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 Significant motion artifact through the neck. No hemodynamically significant stenosis identified. Initial results were called by telephone at the time of interpretation on 04/24/2020 at 12:55 pm to provider MATTHEW TRIFAN ; [facility] , who verbally acknowledged hese results. Electronically Signed   By: Macy Mis M.D.   On: 04/24/2020 13:06   CT ANGIO NECK CODE STROKE  Result Date: 04/24/2020 CLINICAL DATA:  Code stroke.  Left-sided weakness EXAM: CT HEAD WITHOUT CONTRAST CT ANGIOGRAPHY OF THE HEAD AND NECK TECHNIQUE: Contiguous axial images were obtained from the base of the skull through the vertex without intravenous contrast. Multidetector CT imaging of the head and neck was performed using the standard protocol during bolus administration of intravenous contrast. Multiplanar CT image reconstructions and MIPs were obtained to evaluate the vascular anatomy. Carotid stenosis measurements (when applicable) are obtained utilizing NASCET criteria, using the distal internal carotid diameter as the denominator. CONTRAST:  57mL OMNIPAQUE IOHEXOL 350 MG/ML SOLN COMPARISON:  December 2019 head CT, 2015 MRA FINDINGS: CT HEAD Brain: There is no acute intracranial hemorrhage. Suspected loss of gray-white differentiation in the right temporal lobe and posterior insula. Chronic infarcts of the parietal lobes bilaterally. Additional patchy hypoattenuation in the supratentorial white matter probably reflects mild chronic microvascular ischemic changes. There is no  hydrocephalus or extra-axial collection. Vascular: Hyperdensity at the right MCA bifurcation. Skull: Calvarium is unremarkable. Sinuses/Orbits: No acute finding. Other: None. ASPECTS ([place] Stroke Program Early CT Score) - Ganglionic level infarction (caudate, lentiform nuclei, internal capsule, insula, M1-M3 cortex): 5 - Supraganglionic infarction (M4-M6 cortex): 3 Total score (0-10 with 10 being normal): 8 Review of the MIP images confirms the above findings CTA NECK Motion artifact is present. Aortic arch: Great vessel origins are patent.  Right carotid system: Patent. No measurable stenosis at the ICA origin. Left carotid system: Patent. No measurable stenosis at the ICA origin. Vertebral arteries: Patent. Skeleton: Advanced degenerative changes of the cervical spine. Other neck: Negative within above limitation. Upper chest: Negative within above limitation. Review of the MIP images confirms the above findings CTA HEAD Anterior circulation: Intracranial internal carotid arteries are patent with minimal calcified plaque. Right M1 MCA is patent. There is thrombus at the MCA bifurcation occluding one of the branch origins (correlating with prior MRA) and partially occluding the other. Both anterior and left middle cerebral arteries are patent. 1 mm inferiorly directed outpouching from the distal supraclinoid right ICA at the expected origin of a posterior communicating artery. Posterior circulation: Intracranial vertebral arteries, basilar artery, and posterior cerebral arteries are patent. There is fetal origin of the left PCA. Venous sinuses: Patent as allowed by contrast bolus timing. Review of the MIP images confirms the above findings IMPRESSION: No acute intracranial hemorrhage. Acute infarction involving right temporal lobe and posterior insula. ASPECT score is 8. Thrombus at the right MCA bifurcation occluding one branch origin and partially occluding the other. 1 mm inferiorly directed aneurysm or  infundibulum of the distal supraclinoid right ICA. Significant motion artifact through the neck. No hemodynamically significant stenosis identified. Initial results were called by telephone at the time of interpretation on 04/24/2020 at 12:55 pm to provider MATTHEW TRIFAN ; [facility] , who verbally acknowledged hese results. Electronically Signed   By: Macy Mis M.D.   On: 04/24/2020 13:06    Labs:  CBC: Recent Labs    04/24/20 1220 04/24/20 1245 04/25/20 0446  WBC 9.8  --  4.8  HGB 13.1 12.9 12.2  HCT 42.2 38.0 36.9  PLT 288  --  261    COAGS: Recent Labs    04/24/20 1220  INR 1.0  APTT 27    BMP: Recent Labs    04/24/20 1220 04/24/20 1245 04/25/20 0446  NA 140 139 141  K 3.8 3.7 3.9  CL 106 104 108  CO2 24  --  23  GLUCOSE 165* 166* 114*  BUN 14 15 11   CALCIUM 9.0  --  8.6*  CREATININE 0.94 0.80 0.71  GFRNONAA >60  --  >60    LIVER FUNCTION TESTS: Recent Labs    04/24/20 1220  BILITOT 1.7*  AST 23  ALT 16  ALKPHOS 40  PROT 6.3*  ALBUMIN 3.7    Assessment and Plan:  History of acute CVA s/p cerebral arteriogram with emergent mechanical thrombectomy of right MCA distal M1 occlusion achieving a TICI 3 revascularization via right femoral approach 04/24/2020 by Dr. Estanislado Pandy. Patient's condition stable- awake and alert, follows simple commands, moving all extremities. Right femoral puncture site stable, distal pulses (DPs) 2+ bilaterally. Further plans per neurology- appreciate and agree with management. Please call NIR with questions/concerns.   Electronically Signed: Earley Abide, PA-C 04/25/2020, 11:04 AM   I spent a total of 25 Minutes at the the patient's bedside AND on the patient's hospital floor or unit, greater than 50% of which was counseling/coordinating care for CVA s/p revascularization.

## 2020-04-25 NOTE — Progress Notes (Signed)
Received from 4N ICU via wheelchair; patient is alert and oriented; oriented to room and unit routine; bed alarm explained; bed is low, locked and alarm is set.

## 2020-04-25 NOTE — Discharge Instructions (Addendum)
Femoral Site Care This sheet gives you information about how to care for yourself after your procedure. Your health care provider may also give you more specific instructions. If you have problems or questions, contact your health care provider. What can I expect after the procedure? After the procedure, it is common to have:  Bruising that usually fades within 1-2 weeks.  Tenderness at the site. Follow these instructions at home: Wound care 1. Follow instructions from your health care provider about how to take care of your insertion site. Make sure you: ? Wash your hands with soap and water before you change your bandage (dressing). If soap and water are not available, use hand sanitizer. ? Change your dressing as directed- pressure dressing removed 24 hours post-procedure (and switch for bandaid), bandaid removed 72 hours post-procedure 2. Do not take baths, swim, or use a hot tub for 7 days post-procedure. 3. You may shower 48 hours after the procedure or as told by your health care provider. ? Gently wash the site with plain soap and water. ? Pat the area dry with a clean towel. ? Do not rub the site. This may cause bleeding. 4. Check your site every day for signs of infection. Check for: ? Redness, swelling, or pain. ? Fluid or blood. ? Warmth. ? Pus or a bad smell. Activity  Do not stoop, bend, or lift anything that is heavier than 10 lb (4.5 kg) for 2 weeks post-procedure.  Do not drive self for 2 weeks post-procedure. Contact a health care provider if you have:  A fever or chills.  You have redness, swelling, or pain around your insertion site. Get help right away if:  The catheter insertion area swells very fast.  You pass out.  You suddenly start to sweat or your skin gets clammy.  The catheter insertion area is bleeding, and the bleeding does not stop when you hold steady pressure on the area.  The area near or just beyond the catheter insertion site becomes  pale, cool, tingly, or numb. These symptoms may represent a serious problem that is an emergency. Do not wait to see if the symptoms will go away. Get medical help right away. Call your local emergency services (911 in the [place] Do not drive yourself to the hospital.  This information is not intended to replace advice given to you by your health care provider. Make sure you discuss any questions you have with your health care provider. Document Revised: 03/14/2017 Document Reviewed: 03/14/2017 Elsevier Patient Education  2020 [place] on my medicine - ELIQUIS (apixaban)  Why was Eliquis prescribed for you? Eliquis was prescribed for you to reduce the risk of a blood clot forming that can cause a stroke if you have a medical condition called atrial fibrillation (a type of irregular heartbeat).  What do You need to know about Eliquis ? Take your Eliquis TWICE DAILY - one tablet in the morning and one tablet in the evening with or without food. If you have difficulty swallowing the tablet whole please discuss with your pharmacist how to take the medication safely.  Take Eliquis exactly as prescribed by your doctor and DO NOT stop taking Eliquis without talking to the doctor who prescribed the medication.  Stopping may increase your risk of developing a stroke.  Refill your prescription before you run out.  After discharge, you should have regular check-up appointments with your healthcare provider that is prescribing your Eliquis.  In the future your dose  may need to be changed if your kidney function or weight changes by a significant amount or as you get older.  What do you do if you miss a dose? If you miss a dose, take it as soon as you remember on the same day and resume taking twice daily.  Do not take more than one dose of ELIQUIS at the same time to make up a missed dose.  Important Safety Information A possible side effect of Eliquis is bleeding. You should  call your healthcare provider right away if you experience any of the following: ? Bleeding from an injury or your nose that does not stop. ? Unusual colored urine (red or dark brown) or unusual colored stools (red or black). ? Unusual bruising for unknown reasons. ? A serious fall or if you hit your head (even if there is no bleeding).  Some medicines may interact with Eliquis and might increase your risk of bleeding or clotting while on Eliquis. To help avoid this, consult your healthcare provider or pharmacist prior to using any new prescription or non-prescription medications, including herbals, vitamins, non-steroidal anti-inflammatory drugs (NSAIDs) and supplements.  This website has more information on Eliquis (apixaban): http://www.eliquis.com/eliquis/home

## 2020-04-25 NOTE — Telephone Encounter (Signed)
Called [phone] and left a message we tried to reach pt for a follow up call. maw

## 2020-04-25 NOTE — Evaluation (Signed)
Physical Therapy Evaluation Patient Details Name: Felicia Hunter MRN: [id number] DOB: [birth year] Today's Date: 04/25/2020   History of Present Illness  80 yo female presenting with ED on 2/10 with left sided weakness, slurred speech, and R gaze. MRI showing small acute cortical infarct in the posterolateral R temporal lobe and scattered puncate infarcts in R lentiform, perirolandic cortex. s/p R common carotid arteriogram via R CFA approach with complete revascularization of occluded R MCA on 2/10. PMH including A fib, breast cancer s/p double mastectomy (2011), clavical fx, hyperlipidemia, MVP, CVA (2013), osteoporosis, depression, R TKA, B THA.  Clinical Impression   Pt presents with impaired higher level balance, poor safety awareness, and decreased activity tolerance vs baseline. Pt to benefit from acute PT to address deficits. Pt ambulated hallway distance with close guard for safety, pt tolerating mild challenges to balance well but unsteadiness evident during gait. Pt with poor insight into current deficits, PT encouraging 24/7 supervision initially upon d/c from acute setting, OPPT to address deficits. PT to progress mobility as tolerated, and will continue to follow acutely.      Follow Up Recommendations Outpatient PT;Supervision/Assistance - 24 hour    Equipment Recommendations  None recommended by PT    Recommendations for Other Services       Precautions / Restrictions Precautions Precautions: Fall Restrictions Weight Bearing Restrictions: No      Mobility  Bed Mobility Overal bed mobility: Needs Assistance             General bed mobility comments: up with OT    Transfers                 General transfer comment: min guard for stand>sit for safety, already up with OT upon PT arrival.  Ambulation/Gait Ambulation/Gait assistance: Min guard Gait Distance (Feet): 450 Feet Assistive device: None Gait Pattern/deviations: Step-through pattern;Decreased  stride length;Narrow base of support;Drifts right/left Gait velocity: decr   General Gait Details: Min guard for safety, slight R lateral shoulder and head tilt during gait but per pt report she feels upright. Slight drifting of gait noted, no overt weakness L vs R during functional activity.  Stairs            Wheelchair Mobility    Modified Rankin (Stroke Patients Only) Modified Rankin (Stroke Patients Only) Pre-Morbid Rankin Score: No symptoms Modified Rankin: Moderately severe disability     Balance Overall balance assessment: Needs assistance Sitting-balance support: No upper extremity supported;Feet supported Sitting balance-Leahy Scale: Good     Standing balance support: No upper extremity supported;During functional activity Standing balance-Leahy Scale: Fair Standing balance comment: able to ambulate without AD, mild unsteadiness noted               High Level Balance Comments: WFL: fast/slow walking, picking up object from floor             Pertinent Vitals/Pain Pain Assessment: No/denies pain    Home Living Family/patient expects to be discharged to:: Private residence Living Arrangements: Alone Available Help at Discharge: Family;Available PRN/intermittently Type of Home: House Home Access: Stairs to enter Entrance Stairs-Rails:  (uses brick wall) Entrance Stairs-Number of Steps: 3 Home Layout: Two level Home Equipment: Toilet riser;Walker - 2 wheels;Cane - single point      Prior Function Level of Independence: Independent         Comments: enjoys playing ukelele, going hiking with her boyfriend [name], and doing pool fitness classes     Hand Dominance   Dominant Hand: Left  Extremity/Trunk Assessment   Upper Extremity Assessment Upper Extremity Assessment: Defer to OT evaluation    Lower Extremity Assessment Lower Extremity Assessment: Overall WFL for tasks assessed    Cervical / Trunk Assessment Cervical / Trunk  Assessment: Normal  Communication   Communication: No difficulties  Cognition Arousal/Alertness: Awake/alert Behavior During Therapy: WFL for tasks assessed/performed Overall Cognitive Status: Impaired/Different from baseline Area of Impairment: Safety/judgement                         Safety/Judgement: Decreased awareness of safety;Decreased awareness of deficits     General Comments: A&Ox4, lacks insight into mobility deficits stating she feels fine and does not feel different from baseline when she is. Pragmatic deficits, including poor turn taking during conversation, inappropriate laughing during session      General Comments      Exercises     Assessment/Plan    PT Assessment Patient needs continued PT services  PT Problem List Decreased strength;Decreased mobility;Decreased activity tolerance;Decreased balance;Decreased knowledge of use of DME;Decreased cognition;Decreased safety awareness;Decreased knowledge of precautions       PT Treatment Interventions DME instruction;Therapeutic activities;Gait training;Therapeutic exercise;Patient/family education;Balance training;Stair training;Functional mobility training;Neuromuscular re-education    PT Goals (Current goals can be found in the Care Plan section)  Acute Rehab PT Goals Patient Stated Goal: go home ASAP PT Goal Formulation: With patient/family Time For Goal Achievement: 05/09/20 Potential to Achieve Goals: Good    Frequency Min 4X/week   Barriers to discharge        Co-evaluation               AM-PAC PT "6 Clicks" Mobility  Outcome Measure Help needed turning from your back to your side while in a flat bed without using bedrails?: A Little Help needed moving from lying on your back to sitting on the side of a flat bed without using bedrails?: A Little Help needed moving to and from a bed to a chair (including a wheelchair)?: A Little Help needed standing up from a chair using your arms  (e.g., wheelchair or bedside chair)?: A Little Help needed to walk in hospital room?: A Little Help needed climbing 3-5 steps with a railing? : A Little 6 Click Score: 18    End of Session Equipment Utilized During Treatment: Gait belt Activity Tolerance: Patient tolerated treatment well Patient left: in chair;with call bell/phone within reach;with chair alarm set Nurse Communication: Mobility status PT Visit Diagnosis: Other abnormalities of gait and mobility (R26.89);Unsteadiness on feet (R26.81)    Time: 6384-5364 PT Time Calculation (min) (ACUTE ONLY): 14 min   Charges:   PT Evaluation $PT Eval Low Complexity: 1 Low         Aydin Hink S, PT Acute Rehabilitation Services Pager [phone]  Office [phone]  Louis Matte 04/25/2020, 12:33 PM

## 2020-04-25 NOTE — Anesthesia Postprocedure Evaluation (Signed)
Anesthesia Post Note  Patient: Felicia Hunter  Procedure(s) Performed: IR WITH ANESTHESIA (N/A )     Patient location during evaluation: NICU Anesthesia Type: General Level of consciousness: awake and alert Pain management: pain level controlled Vital Signs Assessment: post-procedure vital signs reviewed and stable Respiratory status: spontaneous breathing, nonlabored ventilation, respiratory function stable and patient connected to nasal cannula oxygen Cardiovascular status: blood pressure returned to baseline and stable Postop Assessment: no apparent nausea or vomiting Anesthetic complications: no   No complications documented.  Last Vitals:  Vitals:   04/25/20 0700 04/25/20 0800  BP: 126/70 (!) 114/96  Pulse: 62 70  Resp: (!) 28 15  Temp:    SpO2: 93% 95%    Last Pain:  Vitals:   04/25/20 0800  TempSrc:   PainSc: 0-No pain                 Jourdyn Hasler S

## 2020-04-26 DIAGNOSIS — I6601 Occlusion and stenosis of right middle cerebral artery: Secondary | ICD-10-CM | POA: Diagnosis not present

## 2020-04-26 MED ORDER — APIXABAN 5 MG PO TABS: 5.0000 mg | ORAL_TABLET | Freq: Two times a day (BID) | ORAL | Status: DC

## 2020-04-26 MED ORDER — APIXABAN 5 MG PO TABS: 5.0000 mg | ORAL_TABLET | Freq: Two times a day (BID) | ORAL | 2 refills | Status: AC

## 2020-04-26 MED ORDER — APIXABAN 5 MG PO TABS
5.0000 mg | ORAL_TABLET | Freq: Two times a day (BID) | ORAL | Status: DC
  Administered 2020-04-26: 5 mg via ORAL
  Filled 2020-04-26: qty 1

## 2020-04-26 MED ORDER — ADULT MULTIVITAMIN W/MINERALS CH: 1.0000 | ORAL_TABLET | Freq: Every day | ORAL | Status: AC

## 2020-04-26 MED ORDER — SENNOSIDES-DOCUSATE SODIUM 8.6-50 MG PO TABS: 1.0000 | ORAL_TABLET | Freq: Every evening | ORAL | 0 refills | Status: DC | PRN

## 2020-04-26 NOTE — Progress Notes (Signed)
Physical Therapy Treatment Patient Details Name: Felicia Hunter MRN: [id number] DOB: Nov 16, 1940 Today's Date: 04/26/2020    History of Present Illness 80 yo female presenting with ED on 2/10 with left sided weakness, slurred speech, and R gaze. MRI showing small acute cortical infarct in the posterolateral R temporal lobe and scattered puncate infarcts in R lentiform, perirolandic cortex. s/p R common carotid arteriogram via R CFA approach with complete revascularization of occluded R MCA on 2/10. PMH including A fib, breast cancer s/p double mastectomy (2011), clavical fx, hyperlipidemia, MVP, CVA (2013), osteoporosis, depression, R TKA, B THA.    PT Comments    Pt is progressing well towards goals. She ambulated in hallway and negotiated steps. Continue to note balance deficits during gait and would benefit from continued skilled PT at d/c. Will continue to follow acutely.     Follow Up Recommendations  Outpatient PT;Supervision/Assistance - 24 hour     Equipment Recommendations  None recommended by PT    Recommendations for Other Services       Precautions / Restrictions Precautions Precautions: Fall    Mobility  Bed Mobility Overal bed mobility: Modified Independent                  Transfers Overall transfer level: Needs assistance   Transfers: Sit to/from Stand Sit to Stand: Supervision         General transfer comment: supervision for safety  Ambulation/Gait Ambulation/Gait assistance: Min guard;Supervision Gait Distance (Feet): 225 Feet Assistive device: None Gait Pattern/deviations: Step-through pattern;Decreased stride length;Narrow base of support;Decreased step length - left;Decreased dorsiflexion - right;Decreased dorsiflexion - left Gait velocity: decr   General Gait Details: min guard for safety progressing to supervision. Noted mild instability but no overt LOB noted.   Stairs Stairs: Yes Stairs assistance: Min guard Stair Management: One  rail Right;Alternating pattern;Forwards Number of Stairs: 8 General stair comments: Pt able to negotiate steps with min guard and alternating pattern.   Wheelchair Mobility    Modified Rankin (Stroke Patients Only) Modified Rankin (Stroke Patients Only) Pre-Morbid Rankin Score: No symptoms Modified Rankin: Moderately severe disability     Balance Overall balance assessment: Needs assistance Sitting-balance support: No upper extremity supported;Feet supported Sitting balance-Leahy Scale: Good     Standing balance support: No upper extremity supported;During functional activity Standing balance-Leahy Scale: Good Standing balance comment: able to ambulate without AD, mild unsteadiness noted                            Cognition Arousal/Alertness: Awake/alert Behavior During Therapy: WFL for tasks assessed/performed Overall Cognitive Status: Impaired/Different from baseline Area of Impairment: Safety/judgement;Problem solving                         Safety/Judgement: Decreased awareness of safety;Decreased awareness of deficits   Problem Solving: Slow processing;Requires verbal cues General Comments: Pt presenting with decreased executive function, problem solving, and progmatic skills. Pt requiring increased time to follow cues. Pt with poor awareness of her deficits and significance of situation. Pt also with moments of laughing that were slightly inappropiate to situation or conversation. Noted some trouble following commands, unsure if this was related to cognition or [facility]      Exercises Other Exercises Other Exercises: sit<>stand 5x normal, 5x LLE placed at disadvantage, 10x RLE placed at disadvantage. Pt reports feeling no difference in difficulty and no visual evidence of greater difficulty when relying more on the  LLE.    General Comments        Pertinent Vitals/Pain Pain Assessment: No/denies pain    Home Living                       Prior Function            PT Goals (current goals can now be found in the care plan section) Acute Rehab PT Goals Patient Stated Goal: go home ASAP PT Goal Formulation: With patient/family Time For Goal Achievement: 05/09/20 Potential to Achieve Goals: Good Progress towards PT goals: Progressing toward goals    Frequency    Min 4X/week      PT Plan Current plan remains appropriate    Co-evaluation              AM-PAC PT "6 Clicks" Mobility   Outcome Measure  Help needed turning from your back to your side while in a flat bed without using bedrails?: None Help needed moving from lying on your back to sitting on the side of a flat bed without using bedrails?: None Help needed moving to and from a bed to a chair (including a wheelchair)?: A Little Help needed standing up from a chair using your arms (e.g., wheelchair or bedside chair)?: A Little Help needed to walk in hospital room?: A Little Help needed climbing 3-5 steps with a railing? : A Little 6 Click Score: 20    End of Session Equipment Utilized During Treatment: Gait belt Activity Tolerance: Patient tolerated treatment well Patient left: in chair;with call bell/phone within reach Nurse Communication: Mobility status PT Visit Diagnosis: Other abnormalities of gait and mobility (R26.89);Unsteadiness on feet (R26.81)     Time: 4656-8127 PT Time Calculation (min) (ACUTE ONLY): 28 min  Charges:  $Gait Training: 8-22 mins $Neuromuscular Re-education: 8-22 mins                     Benjiman Core, [place] Pager [phone] Acute Rehab   Allena Katz 04/26/2020, 10:19 AM

## 2020-04-26 NOTE — Progress Notes (Addendum)
ANTICOAGULATION CONSULT NOTE - Initial Consult  Pharmacy Consult for apixaban Indication: atrial fibrillation  Allergies  Allergen Reactions  . Sulfonamide Derivatives Rash  . Sulfacetamide Sodium-Sulfur Rash    Patient Measurements: Height: 5\' 5"  (165.1 cm) Weight: 62 kg (136 lb 11 oz) IBW/kg (Calculated) : 57  Vital Signs: Temp: 97.7 F (36.5 C) (02/12 0833) Temp Source: Oral (02/12 0833) BP: 102/62 (02/12 0833) Pulse Rate: 61 (02/12 0833)  Labs: Recent Labs    04/24/20 1220 04/24/20 1245 04/25/20 0446  HGB 13.1 12.9 12.2  HCT 42.2 38.0 36.9  PLT 288  --  261  APTT 27  --   --   LABPROT 13.2  --   --   INR 1.0  --   --   CREATININE 0.94 0.80 0.71    Estimated Creatinine Clearance: 51.3 mL/min (by C-G formula based on SCr of 0.71 mg/dL).   Medical History: Past Medical History:  Diagnosis Date  . Allergy   . Alopecia   . Arthritis   . Atrial fibrillation ([place])   . Breast cancer ([place])    double mastectomy 2011  . Clavicle fracture    right clavicle-hardware retained  . Clotting disorder ([place])    with CVA- 2013, 2014  . Cough    PERSISTANT COUGH X 20 YRS  . Difficulty sleeping    takes Ambien  . Disorders of bilirubin excretion   . Diverticulosis   . Environmental and seasonal allergies    "dry cough", related to ? allergies  . GERD (gastroesophageal reflux disease)   . Hyperlipidemia   . MVP (mitral valve prolapse)   . Neuromuscular disorder ([place])    Polypmyalgia  Pheumatica  . Osteopenia   . Osteoporosis   . Stroke ([place]) 06/07/11   x2 episodes-"funny feeling"" equilibrium off x24 hours, x1 speech affected- no residuallast 1 yr ago.  . Tubulovillous adenoma of colon 08/2003    Medications:  Scheduled:  . aspirin EC  325 mg Oral Daily  . atorvastatin  10 mg Oral Daily  . cholecalciferol  1,000 Units Oral Daily  . multivitamin with minerals  1 tablet Oral Daily  . predniSONE  4 mg Oral Daily  . sodium chloride flush  3 mL Intravenous Once     Assessment: 80 yo admitted for R M1 stroke s/p mechanical thrombectomy.  Did not receive tPA.  Patient was on Xarelto PTA but was held for ~3 days (last dose Mon 2/7) in anticipation of colonoscopy.  Patient has now passed her swallow evaluation.  Pharmacy consulted to dose apixaban and patient is in agreement with medication change.  CBC stable.  Goal of Therapy:  Monitor platelets by anticoagulation protocol: Yes   Plan:  Apixaban 5mg  BID  (age <24yrs, weight >60kg, Scr <1.5) D/c aspirin per neuro Monitor CBC, s/sx bleeding TOC benefits check placed  Dimple Nanas, PharmD PGY-1 Acute Care Pharmacy Resident Office: [phone] 04/26/2020 10:36 AM

## 2020-04-26 NOTE — Care Management (Signed)
Provided with 30 day card for Eliquis.

## 2020-04-26 NOTE — Progress Notes (Signed)
Patient ready for discharge to home; discharge instructions given and reviewed; Rx sent electronically; eliquis card given to patient with her discharge papers and explained; patient's daughter has arrived to accompany her home; patient discharged out via wheelchair.

## 2020-04-26 NOTE — Discharge Summary (Addendum)
Stroke Discharge Summary  Patient ID: Felicia Hunter   MRN: [id number]      DOB: 1941-01-14  Date of Admission: 04/24/2020 Date of Discharge: 04/26/2020  Attending Physician:  Garvin Fila, MD, Stroke MD Consultant(s):    Neurosurgery  Patient's PCP:  Deland Pretty, MD  DISCHARGE DIAGNOSIS:  Active Problems:   Stroke (cerebrum) [facility])   Middle cerebral artery embolism, right S/p IR Thrombectomy Atrial Fibrillation HTN H/O Stroke Polymyalgia Rheumatica  Allergies as of 04/26/2020      Reactions   Sulfonamide Derivatives Rash   Sulfacetamide Sodium-sulfur Rash      Medication List    STOP taking these medications   multivitamin capsule   rivaroxaban 20 MG Tabs tablet Commonly known as: Xarelto     TAKE these medications   acetaminophen 500 MG tablet Commonly known as: TYLENOL Take 1,000 mg by mouth as needed for mild pain.   alendronate 70 MG tablet Commonly known as: FOSAMAX Take 70 mg by mouth every Sunday.   apixaban 5 MG Tabs tablet Commonly known as: ELIQUIS Take 1 tablet (5 mg total) by mouth 2 (two) times daily.   atorvastatin 10 MG tablet Commonly known as: LIPITOR Take 10 mg by mouth daily.   BIOTIN PO Take 1 tablet by mouth daily.   CALCIUM 600/VITAMIN D3 PO Take 1 tablet by mouth daily.   cholecalciferol 1000 units tablet Commonly known as: VITAMIN D Take 1,000 Units by mouth daily.   FISH OIL PO Take 1 capsule by mouth 2 (two) times daily.   multivitamin with minerals Tabs tablet Take 1 tablet by mouth daily. Start taking on: April 27, 2020   polyvinyl alcohol 1.4 % ophthalmic solution Commonly known as: LIQUIFILM TEARS Place 1 drop into both eyes as needed for dry eyes.   predniSONE 5 MG tablet Commonly known as: DELTASONE Take 5 mg by mouth daily. What changed: Another medication with the same name was removed. Continue taking this medication, and follow the directions you see here.   senna-docusate 8.6-50 MG  tablet Commonly known as: Senokot-S Take 1 tablet by mouth at bedtime as needed for mild constipation.   vitamin C 500 MG tablet Commonly known as: ASCORBIC ACID Take 500 mg by mouth daily as needed (for cold symptoms).   zolpidem 10 MG tablet Commonly known as: AMBIEN Take 10 mg by mouth at bedtime as needed for sleep.            Discharge Care Instructions  (From admission, onward)         Start     Ordered   04/26/20 0000  No dressing needed        02 /12/22 1159          LABORATORY STUDIES CBC    Component Value Date/Time   WBC 4.8 04/25/2020 0446   RBC 3.86 (L) 04/25/2020 0446   HGB 12.2 04/25/2020 0446   HGB 14.3 10/19/2018 1324   HGB 14.5 10/21/2016 1335   HCT 36.9 04/25/2020 0446   HCT 43.7 10/21/2016 1335   PLT 261 04/25/2020 0446   PLT 227 10/19/2018 1324   PLT 219 10/21/2016 1335   MCV 95.6 04/25/2020 0446   MCV 96 10/21/2016 1335   MCH 31.6 04/25/2020 0446   MCHC 33.1 04/25/2020 0446   RDW 13.3 04/25/2020 0446   RDW 13.2 10/21/2016 1335   LYMPHSABS 0.5 (L) 04/25/2020 0446   LYMPHSABS 1.5 10/21/2016 1335   MONOABS 0.4 04/25/2020 0446  EOSABS 0.0 04/25/2020 0446   EOSABS 0.1 10/21/2016 1335   BASOSABS 0.0 04/25/2020 0446   BASOSABS 0.0 10/21/2016 1335   CMP    Component Value Date/Time   NA 141 04/25/2020 0446   NA 139 10/21/2016 1335   K 3.9 04/25/2020 0446   K 4.1 10/21/2016 1335   CL 108 04/25/2020 0446   CL 106 10/21/2016 1335   CO2 23 04/25/2020 0446   CO2 26 10/21/2016 1335   GLUCOSE 114 (H) 04/25/2020 0446   GLUCOSE 115 10/21/2016 1335   BUN 11 04/25/2020 0446   BUN 16 10/21/2016 1335   CREATININE 0.71 04/25/2020 0446   CREATININE 0.88 10/19/2018 1324   CREATININE 0.9 10/21/2016 1335   CALCIUM 8.6 (L) 04/25/2020 0446   CALCIUM 9.4 10/21/2016 1335   PROT 6.3 (L) 04/24/2020 1220   PROT 6.8 10/21/2016 1335   ALBUMIN 3.7 04/24/2020 1220   ALBUMIN 4.0 10/21/2016 1335   ALBUMIN 4.3 10/23/2015 1429   AST 23 04/24/2020 1220    AST 20 10/19/2018 1324   ALT 16 04/24/2020 1220   ALT 16 10/19/2018 1324   ALT 24 10/21/2016 1335   ALKPHOS 40 04/24/2020 1220   ALKPHOS 40 10/21/2016 1335   BILITOT 1.7 (H) 04/24/2020 1220   BILITOT 1.6 (H) 10/19/2018 1324   GFRNONAA >60 04/25/2020 0446   GFRNONAA >60 10/19/2018 1324   GFRAA >60 10/19/2018 1324   COAGS Lab Results  Component Value Date   INR 1.0 04/24/2020   INR 1.22 12/25/2014   INR 1.04 11/05/2013   Lipid Panel    Component Value Date/Time   CHOL 154 04/25/2020 0446   TRIG 59 04/25/2020 0446   HDL 83 04/25/2020 0446   CHOLHDL 1.9 04/25/2020 0446   VLDL 12 04/25/2020 0446   LDLCALC 59 04/25/2020 0446   HgbA1C  Lab Results  Component Value Date   HGBA1C 5.8 (H) 04/25/2020   Urinalysis    Component Value Date/Time   COLORURINE YELLOW 04/04/2013 0949   APPEARANCEUR CLEAR 04/04/2013 0949   LABSPEC 1.026 04/04/2013 0949   PHURINE 6.0 04/04/2013 0949   GLUCOSEU NEGATIVE 04/04/2013 0949   HGBUR NEGATIVE 04/04/2013 0949   BILIRUBINUR NEGATIVE 04/04/2013 0949   KETONESUR NEGATIVE 04/04/2013 0949   PROTEINUR NEGATIVE 04/04/2013 0949   UROBILINOGEN 0.2 04/04/2013 0949   NITRITE NEGATIVE 04/04/2013 0949   LEUKOCYTESUR NEGATIVE 04/04/2013 0949   Urine Drug Screen     Component Value Date/Time   LABOPIA NONE DETECTED 04/24/2020 1624   COCAINSCRNUR NONE DETECTED 04/24/2020 1624   LABBENZ NONE DETECTED 04/24/2020 1624   AMPHETMU NONE DETECTED 04/24/2020 1624   THCU NONE DETECTED 04/24/2020 1624   LABBARB NONE DETECTED 04/24/2020 1624    Alcohol Level No results found for: ETH   SIGNIFICANT DIAGNOSTIC STUDIES MR ANGIO HEAD WO CONTRAST  Result Date: 04/25/2020 CLINICAL DATA:  80 year old female code stroke presentation. Right MCA territory infarct, right MCA bifurcation LVO. Status post endovascular revascularization. EXAM: MRI HEAD WITHOUT CONTRAST MRA HEAD WITHOUT CONTRAST TECHNIQUE: Multiplanar, multiecho pulse sequences of the brain and  surrounding structures were obtained without intravenous contrast. Angiographic images of the head were obtained using MRA technique without contrast. COMPARISON:  CTA head and neck, CT head yesterday. Brain MRI and intracranial MRA 11/05/2013. FINDINGS: MRI HEAD FINDINGS Brain: Cortical restricted diffusion in a roughly 3.5 cm area of the posterior right temporal lobe, junction with the lateral occipital lobe (series 9, image 76). Punctate diffusion restriction at the posterior right lentiform on image 77.  And additional punctate scattered cortical infarcts in the right operculum and perirolandic cortex (images 88, 94, 96). Mild associated cytotoxic edema. No convincing acute hemorrhage. No mass effect. No contralateral left hemisphere or posterior fossa diffusion restriction. Superimposed chronic post ischemic encephalomalacia in the posterior right MCA/PCA watershed and posterior left PCA territory similar to but progressed since 2015. Stable chronic left cerebellar PICA territory infarct. Minimal to mild superimposed additional nonspecific white matter signal changes and T2 heterogeneity in the deep gray nuclei. No midline shift, mass effect, evidence of mass lesion, ventriculomegaly, extra-axial collection. Cervicomedullary junction and pituitary are within normal limits. Vascular: Major intracranial vascular flow voids appear stable since 2015. Skull and upper cervical spine: Mild degenerative spondylolisthesis in the cervical spine. Visualized bone marrow signal is within normal limits. Sinuses/Orbits: Stable, negative. Other: Trace left mastoid effusion is new as since 2015, where mild right mastoid effusion at that time has resolved. Grossly negative face and scalp soft tissues. MRA HEAD FINDINGS Antegrade flow in the posterior circulation. Codominant distal vertebral arteries appear stable since 2015 without significant stenosis. Bilateral PICA origins remain patent. Patent basilar artery, SCA and right  PCA origins. Fetal left PCA origin redemonstrated. Right posterior communicating artery diminutive or absent. Bilateral PCA branches remain within normal limits. Tortuous left P1 segment. Highly tortuous distal cervical ICAs again noted. Antegrade flow in both ICA siphons is symmetric and appears stable compared to 2015. Ophthalmic and left posterior communicating artery origin are stable. No ICA siphon stenosis. Patent carotid termini. MCA and ACA origins remain patent. Mild irregularity has developed at the right ACA origin compared to 2015. Diminutive anterior communicating artery is stable. Visible ACA branches are stable. Left MCA bifurcates early without stenosis. Visible left MCA branches are stable. Right MCA M1 segment and bifurcation are patent. Mild irregularity at the origin of the dominant posterior right MCA M2 branch (series 1066, image 4) is new. But otherwise the right MCA branches appear patent and stable. IMPRESSION: 1. Relatively small acute cortical infarct in the posterolateral right temporal lobe, and scattered additional punctate infarcts in the right lentiform, perirolandic cortex. No associated hemorrhage or mass effect. 2. Underlying chronic ischemic disease in the bilateral MCA and left PICA territories. 3. Intracranial MRA is negative for large vessel occlusion or hemodynamically significant stenosis. Mild new irregularity since 2015 at the right ACA and dominant right posterior M2 branch origins. Electronically Signed   By: Genevie Ann M.D.   On: 04/25/2020 04:36   MR BRAIN WO CONTRAST  Result Date: 04/25/2020 CLINICAL DATA:  80 year old female code stroke presentation. Right MCA territory infarct, right MCA bifurcation LVO. Status post endovascular revascularization. EXAM: MRI HEAD WITHOUT CONTRAST MRA HEAD WITHOUT CONTRAST TECHNIQUE: Multiplanar, multiecho pulse sequences of the brain and surrounding structures were obtained without intravenous contrast. Angiographic images of the  head were obtained using MRA technique without contrast. COMPARISON:  CTA head and neck, CT head yesterday. Brain MRI and intracranial MRA 11/05/2013. FINDINGS: MRI HEAD FINDINGS Brain: Cortical restricted diffusion in a roughly 3.5 cm area of the posterior right temporal lobe, junction with the lateral occipital lobe (series 9, image 76). Punctate diffusion restriction at the posterior right lentiform on image 77. And additional punctate scattered cortical infarcts in the right operculum and perirolandic cortex (images 88, 94, 96). Mild associated cytotoxic edema. No convincing acute hemorrhage. No mass effect. No contralateral left hemisphere or posterior fossa diffusion restriction. Superimposed chronic post ischemic encephalomalacia in the posterior right MCA/PCA watershed and posterior left PCA territory similar  to but progressed since 2015. Stable chronic left cerebellar PICA territory infarct. Minimal to mild superimposed additional nonspecific white matter signal changes and T2 heterogeneity in the deep gray nuclei. No midline shift, mass effect, evidence of mass lesion, ventriculomegaly, extra-axial collection. Cervicomedullary junction and pituitary are within normal limits. Vascular: Major intracranial vascular flow voids appear stable since 2015. Skull and upper cervical spine: Mild degenerative spondylolisthesis in the cervical spine. Visualized bone marrow signal is within normal limits. Sinuses/Orbits: Stable, negative. Other: Trace left mastoid effusion is new as since 2015, where mild right mastoid effusion at that time has resolved. Grossly negative face and scalp soft tissues. MRA HEAD FINDINGS Antegrade flow in the posterior circulation. Codominant distal vertebral arteries appear stable since 2015 without significant stenosis. Bilateral PICA origins remain patent. Patent basilar artery, SCA and right PCA origins. Fetal left PCA origin redemonstrated. Right posterior communicating artery  diminutive or absent. Bilateral PCA branches remain within normal limits. Tortuous left P1 segment. Highly tortuous distal cervical ICAs again noted. Antegrade flow in both ICA siphons is symmetric and appears stable compared to 2015. Ophthalmic and left posterior communicating artery origin are stable. No ICA siphon stenosis. Patent carotid termini. MCA and ACA origins remain patent. Mild irregularity has developed at the right ACA origin compared to 2015. Diminutive anterior communicating artery is stable. Visible ACA branches are stable. Left MCA bifurcates early without stenosis. Visible left MCA branches are stable. Right MCA M1 segment and bifurcation are patent. Mild irregularity at the origin of the dominant posterior right MCA M2 branch (series 1066, image 4) is new. But otherwise the right MCA branches appear patent and stable. IMPRESSION: 1. Relatively small acute cortical infarct in the posterolateral right temporal lobe, and scattered additional punctate infarcts in the right lentiform, perirolandic cortex. No associated hemorrhage or mass effect. 2. Underlying chronic ischemic disease in the bilateral MCA and left PICA territories. 3. Intracranial MRA is negative for large vessel occlusion or hemodynamically significant stenosis. Mild new irregularity since 2015 at the right ACA and dominant right posterior M2 branch origins. Electronically Signed   By: Genevie Ann M.D.   On: 04/25/2020 04:36   DG Foot 2 Views Left  Result Date: 04/03/2020 Please see detailed radiograph report in office note.  ECHOCARDIOGRAM COMPLETE  Result Date: 04/25/2020    ECHOCARDIOGRAM REPORT   Patient Name:   Felicia Hunter Date of Exam: 04/25/2020 Medical Rec #:  [id number]      Height:       65.0 in Accession #:    [id number]     Weight:       136.7 lb Date of Birth:  September 27, 1940      BSA:          1.683 m Patient Age:    5 years       BP:           114/96 mmHg Patient Gender: F              HR:           70 bpm. Exam  Location:  Inpatient Procedure: 2D Echo, Cardiac Doppler and Color Doppler Indications:    Stroke  History:        Patient has prior history of Echocardiogram examinations, most                 recent 11/07/2013. CVA. H/O Stroke.  Sonographer:    Clayton Lefort RDCS (AE) Referring Phys: Zachary  1.  Left ventricular ejection fraction, by estimation, is 65 to 70%. The left ventricle has normal function. The left ventricle has no regional wall motion abnormalities. There is mild left ventricular hypertrophy. Left ventricular diastolic parameters are consistent with Grade I diastolic dysfunction (impaired relaxation).  2. Right ventricular systolic function is normal. The right ventricular size is normal. There is normal pulmonary artery systolic pressure. The estimated right ventricular systolic pressure is 92.1 mmHg.  3. Right atrial size was mildly dilated.  4. The mitral valve is abnormal. trace to mild mitral valve regurgitation. There is mild late systolic prolapse of multiple scallops of the posterior leaflet of the mitral valve.  5. The aortic valve is tricuspid. Aortic valve regurgitation is not visualized. No aortic stenosis is present. Aortic valve mean gradient measures 5.0 mmHg.  6. The inferior vena cava is normal in size with <50% respiratory variability, suggesting right atrial pressure of 8 mmHg. Comparison(s): No prior Echocardiogram. Conclusion(s)/Recommendation(s): No intracardiac source of embolism detected on this transthoracic study. A transesophageal echocardiogram is recommended to exclude cardiac source of embolism if clinically indicated. FINDINGS  Left Ventricle: Left ventricular ejection fraction, by estimation, is 65 to 70%. The left ventricle has normal function. The left ventricle has no regional wall motion abnormalities. The left ventricular internal cavity size was normal in size. There is  mild left ventricular hypertrophy. Left ventricular diastolic parameters are  consistent with Grade I diastolic dysfunction (impaired relaxation). Indeterminate filling pressures. Right Ventricle: The right ventricular size is normal. No increase in right ventricular wall thickness. Right ventricular systolic function is normal. There is normal pulmonary artery systolic pressure. The tricuspid regurgitant velocity is 2.55 m/s, and  with an assumed right atrial pressure of 8 mmHg, the estimated right ventricular systolic pressure is 19.4 mmHg. Left Atrium: Left atrial size was normal in size. Right Atrium: Right atrial size was mildly dilated. Pericardium: There is no evidence of pericardial effusion. Mitral Valve: The mitral valve is abnormal. There is mild late systolic prolapse of multiple scallops of the posterior leaflet of the mitral valve. There is mild thickening of the mitral valve leaflet(s). Trace to mild mitral valve regurgitation. MV peak  gradient, 4.2 mmHg. The mean mitral valve gradient is 1.0 mmHg. Tricuspid Valve: The tricuspid valve is grossly normal. Tricuspid valve regurgitation is mild. Aortic Valve: The aortic valve is tricuspid. Aortic valve regurgitation is not visualized. No aortic stenosis is present. Aortic valve mean gradient measures 5.0 mmHg. Aortic valve peak gradient measures 9.7 mmHg. Aortic valve area, by VTI measures 2.12 cm. Pulmonic Valve: The pulmonic valve was normal in structure. Pulmonic valve regurgitation is not visualized. Aorta: The aortic root and ascending aorta are structurally normal, with no evidence of dilitation. Venous: The inferior vena cava is normal in size with less than 50% respiratory variability, suggesting right atrial pressure of 8 mmHg. IAS/Shunts: No atrial level shunt detected by color flow Doppler.  LEFT VENTRICLE PLAX 2D LVIDd:         4.30 cm  Diastology LVIDs:         2.70 cm  LV e' medial:    6.53 cm/s LV PW:         1.10 cm  LV E/e' medial:  13.1 LV IVS:        1.50 cm  LV e' lateral:   7.29 cm/s LVOT diam:     1.80 cm   LV E/e' lateral: 11.7 LV SV:         68 LV SV  Index:   40 LVOT Area:     2.54 cm  RIGHT VENTRICLE             IVC RV Basal diam:  3.90 cm     IVC diam: 1.50 cm RV Mid diam:    3.00 cm RV S prime:     21.20 cm/s TAPSE (M-mode): 2.1 cm LEFT ATRIUM             Index       RIGHT ATRIUM           Index LA diam:        3.80 cm 2.26 cm/m  RA Area:     20.90 cm LA Vol (A2C):   69.4 ml 41.24 ml/m RA Volume:   58.00 ml  34.47 ml/m LA Vol (A4C):   39.9 ml 23.71 ml/m LA Biplane Vol: 53.1 ml 31.56 ml/m  AORTIC VALVE AV Area (Vmax):    2.12 cm AV Area (Vmean):   2.07 cm AV Area (VTI):     2.12 cm AV Vmax:           156.00 cm/s AV Vmean:          106.000 cm/s AV VTI:            0.320 m AV Peak Grad:      9.7 mmHg AV Mean Grad:      5.0 mmHg LVOT Vmax:         130.00 cm/s LVOT Vmean:        86.300 cm/s LVOT VTI:          0.267 m LVOT/AV VTI ratio: 0.83  AORTA Ao Root diam: 2.70 cm Ao Asc diam:  2.80 cm MITRAL VALVE               TRICUSPID VALVE MV Area (PHT): 2.91 cm    TR Peak grad:   26.0 mmHg MV Area VTI:   1.80 cm    TR Vmax:        255.00 cm/s MV Peak grad:  4.2 mmHg MV Mean grad:  1.0 mmHg    SHUNTS MV Vmax:       1.02 m/s    Systemic VTI:  0.27 m MV Vmean:      51.7 cm/s   Systemic Diam: 1.80 cm MV Decel Time: 261 msec MV E velocity: 85.30 cm/s MV A velocity: 87.00 cm/s MV E/A ratio:  0.98 Lyman Bishop MD Electronically signed by Lyman Bishop MD Signature Date/Time: 04/25/2020/11:22:19 AM    Final    CT HEAD CODE STROKE WO CONTRAST  Result Date: 04/24/2020 CLINICAL DATA:  Code stroke.  Left-sided weakness EXAM: CT HEAD WITHOUT CONTRAST CT ANGIOGRAPHY OF THE HEAD AND NECK TECHNIQUE: Contiguous axial images were obtained from the base of the skull through the vertex without intravenous contrast. Multidetector CT imaging of the head and neck was performed using the standard protocol during bolus administration of intravenous contrast. Multiplanar CT image reconstructions and MIPs were obtained to evaluate the  vascular anatomy. Carotid stenosis measurements (when applicable) are obtained utilizing NASCET criteria, using the distal internal carotid diameter as the denominator. CONTRAST:  71mL OMNIPAQUE IOHEXOL 350 MG/ML SOLN COMPARISON:  December 2019 head CT, 2015 MRA FINDINGS: CT HEAD Brain: There is no acute intracranial hemorrhage. Suspected loss of gray-white differentiation in the right temporal lobe and posterior insula. Chronic infarcts of the parietal lobes bilaterally. Additional patchy hypoattenuation in the supratentorial white matter probably reflects mild chronic microvascular ischemic changes.  There is no hydrocephalus or extra-axial collection. Vascular: Hyperdensity at the right MCA bifurcation. Skull: Calvarium is unremarkable. Sinuses/Orbits: No acute finding. Other: None. ASPECTS ([place] Stroke Program Early CT Score) - Ganglionic level infarction (caudate, lentiform nuclei, internal capsule, insula, M1-M3 cortex): 5 - Supraganglionic infarction (M4-M6 cortex): 3 Total score (0-10 with 10 being normal): 8 Review of the MIP images confirms the above findings CTA NECK Motion artifact is present. Aortic arch: Great vessel origins are patent. Right carotid system: Patent. No measurable stenosis at the ICA origin. Left carotid system: Patent. No measurable stenosis at the ICA origin. Vertebral arteries: Patent. Skeleton: Advanced degenerative changes of the cervical spine. Other neck: Negative within above limitation. Upper chest: Negative within above limitation. Review of the MIP images confirms the above findings CTA HEAD Anterior circulation: Intracranial internal carotid arteries are patent with minimal calcified plaque. Right M1 MCA is patent. There is thrombus at the MCA bifurcation occluding one of the branch origins (correlating with prior MRA) and partially occluding the other. Both anterior and left middle cerebral arteries are patent. 1 mm inferiorly directed outpouching from the distal  supraclinoid right ICA at the expected origin of a posterior communicating artery. Posterior circulation: Intracranial vertebral arteries, basilar artery, and posterior cerebral arteries are patent. There is fetal origin of the left PCA. Venous sinuses: Patent as allowed by contrast bolus timing. Review of the MIP images confirms the above findings IMPRESSION: No acute intracranial hemorrhage. Acute infarction involving right temporal lobe and posterior insula. ASPECT score is 8. Thrombus at the right MCA bifurcation occluding one branch origin and partially occluding the other. 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 Significant motion artifact through the neck. No hemodynamically significant stenosis identified. Initial results were called by telephone at the time of interpretation on 04/24/2020 at 12:55 pm to provider MATTHEW TRIFAN ; [facility] , who verbally acknowledged hese results. Electronically Signed   By: Macy Mis M.D.   On: 04/24/2020 13:06   CT ANGIO HEAD CODE STROKE  Result Date: 04/24/2020 CLINICAL DATA:  Code stroke.  Left-sided weakness EXAM: CT HEAD WITHOUT CONTRAST CT ANGIOGRAPHY OF THE HEAD AND NECK TECHNIQUE: Contiguous axial images were obtained from the base of the skull through the vertex without intravenous contrast. Multidetector CT imaging of the head and neck was performed using the standard protocol during bolus administration of intravenous contrast. Multiplanar CT image reconstructions and MIPs were obtained to evaluate the vascular anatomy. Carotid stenosis measurements (when applicable) are obtained utilizing NASCET criteria, using the distal internal carotid diameter as the denominator. CONTRAST:  40mL OMNIPAQUE IOHEXOL 350 MG/ML SOLN COMPARISON:  December 2019 head CT, 2015 MRA FINDINGS: CT HEAD Brain: There is no acute intracranial hemorrhage. Suspected loss of gray-white differentiation in the right temporal lobe and  posterior insula. Chronic infarcts of the parietal lobes bilaterally. Additional patchy hypoattenuation in the supratentorial white matter probably reflects mild chronic microvascular ischemic changes. There is no hydrocephalus or extra-axial collection. Vascular: Hyperdensity at the right MCA bifurcation. Skull: Calvarium is unremarkable. Sinuses/Orbits: No acute finding. Other: None. ASPECTS ([place] Stroke Program Early CT Score) - Ganglionic level infarction (caudate, lentiform nuclei, internal capsule, insula, M1-M3 cortex): 5 - Supraganglionic infarction (M4-M6 cortex): 3 Total score (0-10 with 10 being normal): 8 Review of the MIP images confirms the above findings CTA NECK Motion artifact is present. Aortic arch: Great vessel origins are patent. Right carotid system: Patent. No measurable stenosis at the ICA origin. Left carotid  system: Patent. No measurable stenosis at the ICA origin. Vertebral arteries: Patent. Skeleton: Advanced degenerative changes of the cervical spine. Other neck: Negative within above limitation. Upper chest: Negative within above limitation. Review of the MIP images confirms the above findings CTA HEAD Anterior circulation: Intracranial internal carotid arteries are patent with minimal calcified plaque. Right M1 MCA is patent. There is thrombus at the MCA bifurcation occluding one of the branch origins (correlating with prior MRA) and partially occluding the other. Both anterior and left middle cerebral arteries are patent. 1 mm inferiorly directed outpouching from the distal supraclinoid right ICA at the expected origin of a posterior communicating artery. Posterior circulation: Intracranial vertebral arteries, basilar artery, and posterior cerebral arteries are patent. There is fetal origin of the left PCA. Venous sinuses: Patent as allowed by contrast bolus timing. Review of the MIP images confirms the above findings IMPRESSION: No acute intracranial hemorrhage. Acute infarction  involving right temporal lobe and posterior insula. ASPECT score is 8. Thrombus at the right MCA bifurcation occluding one branch origin and partially occluding the other. 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 Significant motion artifact through the neck. No hemodynamically significant stenosis identified. Initial results were called by telephone at the time of interpretation on 04/24/2020 at 12:55 pm to provider MATTHEW TRIFAN ; ERIC [facility] , who verbally acknowledged hese results. Electronically Signed   By: Macy Mis M.D.   On: 04/24/2020 13:06   CT ANGIO NECK CODE STROKE  Result Date: 04/24/2020 CLINICAL DATA:  Code stroke.  Left-sided weakness EXAM: CT HEAD WITHOUT CONTRAST CT ANGIOGRAPHY OF THE HEAD AND NECK TECHNIQUE: Contiguous axial images were obtained from the base of the skull through the vertex without intravenous contrast. Multidetector CT imaging of the head and neck was performed using the standard protocol during bolus administration of intravenous contrast. Multiplanar CT image reconstructions and MIPs were obtained to evaluate the vascular anatomy. Carotid stenosis measurements (when applicable) are obtained utilizing NASCET criteria, using the distal internal carotid diameter as the denominator. CONTRAST:  28mL OMNIPAQUE IOHEXOL 350 MG/ML SOLN COMPARISON:  December 2019 head CT, 2015 MRA FINDINGS: CT HEAD Brain: There is no acute intracranial hemorrhage. Suspected loss of gray-white differentiation in the right temporal lobe and posterior insula. Chronic infarcts of the parietal lobes bilaterally. Additional patchy hypoattenuation in the supratentorial white matter probably reflects mild chronic microvascular ischemic changes. There is no hydrocephalus or extra-axial collection. Vascular: Hyperdensity at the right MCA bifurcation. Skull: Calvarium is unremarkable. Sinuses/Orbits: No acute finding. Other: None. ASPECTS ([place] Stroke  Program Early CT Score) - Ganglionic level infarction (caudate, lentiform nuclei, internal capsule, insula, M1-M3 cortex): 5 - Supraganglionic infarction (M4-M6 cortex): 3 Total score (0-10 with 10 being normal): 8 Review of the MIP images confirms the above findings CTA NECK Motion artifact is present. Aortic arch: Great vessel origins are patent. Right carotid system: Patent. No measurable stenosis at the ICA origin. Left carotid system: Patent. No measurable stenosis at the ICA origin. Vertebral arteries: Patent. Skeleton: Advanced degenerative changes of the cervical spine. Other neck: Negative within above limitation. Upper chest: Negative within above limitation. Review of the MIP images confirms the above findings CTA HEAD Anterior circulation: Intracranial internal carotid arteries are patent with minimal calcified plaque. Right M1 MCA is patent. There is thrombus at the MCA bifurcation occluding one of the branch origins (correlating with prior MRA) and partially occluding the other. Both anterior and left middle cerebral arteries are patent. 1 mm  inferiorly directed outpouching from the distal supraclinoid right ICA at the expected origin of a posterior communicating artery. Posterior circulation: Intracranial vertebral arteries, basilar artery, and posterior cerebral arteries are patent. There is fetal origin of the left PCA. Venous sinuses: Patent as allowed by contrast bolus timing. Review of the MIP images confirms the above findings IMPRESSION: No acute intracranial hemorrhage. Acute infarction involving right temporal lobe and posterior insula. ASPECT score is 8. Thrombus at the right MCA bifurcation occluding one branch origin and partially occluding the other. 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 Significant motion artifact through the neck. No hemodynamically significant stenosis identified. Initial results were called by telephone at the time of  interpretation on 04/24/2020 at 12:55 pm to provider MATTHEW TRIFAN ; ERIC [facility] , who verbally acknowledged hese results. Electronically Signed   By: Macy Mis M.D.   On: 04/24/2020 13:06      HISTORY OF PRESENT ILLNESS As per Attending Note- Felicia Hunter is an 80 y.o. female with a PMHx of A- fib on Xarelto, hyperlipidemia, hypertension, MVP, CVA* 2 (2013, 2014) no baseline neurological deficits, breast cancer s/p bilateral mastectomy 2011, polymyalgia rheumatica, GERD, colon polyps, s/p hysterectomy, hip and total knee replacement who presented to [facility] after acute onset of slurred speech and left sided weakness.   HOSPITAL COURSE [name] is a 80 y.o. female with history of with A-fib on Xarelto (off since 04/22/20, Last dose on 04/21/20) ,hyperlipidemia, hypertension,MVP,CVA* 2 (2013, 2014) no baseline neurological deficits,breast cancer s/p bilateral mastectomy 2011, polymyalgia rheumatica, GERD, colon polyps, s/p hysterectomy, hip and total knee replacementwh[facility] acute onset of slurred speech and left sided weakness. CT showed acute infarction involving right temporal lobe and posterior insula.CTA Head and Neck showed thrombus at the right MCA bifurcation occluding one branch origin and partially occluding the other.S/P mechanical thrombectomy of right MCA distal M1 occlusion achieving revascularization by Dr. Estanislado Pandy. MRI brain (04/25/20) showed relatively small acute cortical infarct in the posterior lateral right temporal lobe, and scattered additional punctate infarcts in the right lentiform, perirolandic cortex with Underlying chronic ischemic disease in the bilateral MCA and left PICA territories. MRA is negative for large vessel occlusion or stenosis.  ECHO showed LVEF of 65 to 70%, right atrial size mildly dilated, trace MVR.  LDL was 59, HBA1c was 5.8. U tox negative. BP was initially controlled by Claviprex but later discontinued. Pt received  Aspirin 325 mg while she was in the hospital. Statin was continued at home dose. Asprin was stopped prior to discharge and Eliquis 5 mg BID was started. PT recommended outpatient PT. Pt to recommended to  follow up with stroke team at [facility] in 4 weeks.   DISCHARGE EXAM Blood pressure 102/62, pulse 61, temperature 97.7 F (36.5 C), temperature source Oral, resp. rate 18, height 5\' 5"  (1.651 m), weight 62 kg, SpO2 96 %. GENERAL: Awake, alert in NAD HEENT: - Normocephalic and atraumatic LUNGS - Symmetrical chest rise, No labored breathing noted CV - no JVD, No Peripheral Edema, Normal rate and rhythm on monitor.  ABDOMEN - Soft,  nondistended  Ext: warm, well perfused, intact peripheral pulses, no Peripheral edema  NEURO Exam:   Mental Status: AA&Ox4, Oriented to self, age, place, situation. Good Attention and Concentration Language: speech is fluent. Naming, repetition intact,  fluency, and comprehension intact Cranial Nerves:   CN II Pupils equal and reactive to light, no VF deficits    CN  III,IV,VI EOM intact, no gaze preference or deviation, no nystagmus    CN V normal sensation in V1, V2, and V3 segments bilaterally    CN VII no asymmetry, no nasolabial fold flattening    CN VIII normal hearing to speech    CN IX & X normal palatal elevation, no uvular deviation    CN XI 5/5 head turn and 5/5 shoulder shrug bilaterally    CN XII midline tongue protrusion    Motor: No Drift in Upper Extremities, No Drift in LE's. Strength 5/5 in Rt UE, Subtle weakness in Left UE 4+/5 , Strength in Lt LE 4+/5, Rt LE 5/5.  Diminished fine finger movements on the left.  Orbits right over left upper extremity. Tone: is normal and bulk is normal Sensation- Intact to light touch bilaterally Coordination: FTN intact bilaterally, no ataxia. Gait- deferred  Discharge Diet       Diet   Diet heart healthy/carb modified Room service appropriate? Yes with Assist; Fluid consistency: Thin    liquids  DISCHARGE PLAN  Disposition:  Home   Eliquis 5 mg twice daily for Atrial Fibrillation and secondary stroke prevention  Ongoing stroke risk factor control by Primary Care Physician at time of discharge  Follow-up PCP Deland Pretty, MD in 2 weeks.  Follow-up in [facility] in 4 weeks, office to schedule an appointment.  Time spent on discharge summary 35 minutes I have personally obtained history,examined this patient, reviewed notes, independently viewed imaging studies, participated in medical decision making and plan of care.ROS completed by me personally and pertinent positives fully documented  I have made any additions or clarifications directly to the above note. Agree with note above. . Recommend discharge home on Eliquis and follow-up as an outpatient stroke clinic in 6 weeks.Antony Contras, MD Medical Director [facility] Pager: [phone] 04/26/2020 1:35 PM

## 2020-04-28 NOTE — Telephone Encounter (Signed)
Thank you for letting me know. I reviewed the Epic records of her hospitalization.

## 2020-05-01 ENCOUNTER — Encounter: Payer: Self-pay | Admitting: *Deleted

## 2020-05-01 ENCOUNTER — Other Ambulatory Visit: Payer: Self-pay | Admitting: *Deleted

## 2020-05-01 DIAGNOSIS — Z86010 Personal history of colonic polyps: Secondary | ICD-10-CM | POA: Insufficient documentation

## 2020-05-01 DIAGNOSIS — K219 Gastro-esophageal reflux disease without esophagitis: Secondary | ICD-10-CM | POA: Insufficient documentation

## 2020-05-01 DIAGNOSIS — I8390 Asymptomatic varicose veins of unspecified lower extremity: Secondary | ICD-10-CM | POA: Insufficient documentation

## 2020-05-01 DIAGNOSIS — L80 Vitiligo: Secondary | ICD-10-CM | POA: Insufficient documentation

## 2020-05-01 DIAGNOSIS — Z8781 Personal history of (healed) traumatic fracture: Secondary | ICD-10-CM | POA: Insufficient documentation

## 2020-05-01 DIAGNOSIS — M353 Polymyalgia rheumatica: Secondary | ICD-10-CM | POA: Insufficient documentation

## 2020-05-01 DIAGNOSIS — Z882 Allergy status to sulfonamides status: Secondary | ICD-10-CM | POA: Insufficient documentation

## 2020-05-01 DIAGNOSIS — Z7901 Long term (current) use of anticoagulants: Secondary | ICD-10-CM | POA: Insufficient documentation

## 2020-05-01 DIAGNOSIS — K5730 Diverticulosis of large intestine without perforation or abscess without bleeding: Secondary | ICD-10-CM | POA: Insufficient documentation

## 2020-05-01 DIAGNOSIS — I700 Atherosclerosis of aorta: Secondary | ICD-10-CM | POA: Insufficient documentation

## 2020-05-01 DIAGNOSIS — Z7952 Long term (current) use of systemic steroids: Secondary | ICD-10-CM | POA: Insufficient documentation

## 2020-05-01 DIAGNOSIS — Z860101 Personal history of adenomatous and serrated colon polyps: Secondary | ICD-10-CM | POA: Insufficient documentation

## 2020-05-01 DIAGNOSIS — R9431 Abnormal electrocardiogram [ECG] [EKG]: Secondary | ICD-10-CM | POA: Insufficient documentation

## 2020-05-01 DIAGNOSIS — M1990 Unspecified osteoarthritis, unspecified site: Secondary | ICD-10-CM | POA: Insufficient documentation

## 2020-05-01 DIAGNOSIS — M818 Other osteoporosis without current pathological fracture: Secondary | ICD-10-CM | POA: Insufficient documentation

## 2020-05-01 DIAGNOSIS — M810 Age-related osteoporosis without current pathological fracture: Secondary | ICD-10-CM | POA: Insufficient documentation

## 2020-05-01 DIAGNOSIS — I480 Paroxysmal atrial fibrillation: Secondary | ICD-10-CM | POA: Insufficient documentation

## 2020-05-01 DIAGNOSIS — I83819 Varicose veins of unspecified lower extremities with pain: Secondary | ICD-10-CM | POA: Insufficient documentation

## 2020-05-01 DIAGNOSIS — L659 Nonscarring hair loss, unspecified: Secondary | ICD-10-CM | POA: Insufficient documentation

## 2020-05-01 DIAGNOSIS — R252 Cramp and spasm: Secondary | ICD-10-CM | POA: Insufficient documentation

## 2020-05-01 DIAGNOSIS — I341 Nonrheumatic mitral (valve) prolapse: Secondary | ICD-10-CM | POA: Insufficient documentation

## 2020-05-01 DIAGNOSIS — Z9013 Acquired absence of bilateral breasts and nipples: Secondary | ICD-10-CM | POA: Insufficient documentation

## 2020-05-01 DIAGNOSIS — D6859 Other primary thrombophilia: Secondary | ICD-10-CM | POA: Insufficient documentation

## 2020-05-01 DIAGNOSIS — Z0000 Encounter for general adult medical examination without abnormal findings: Secondary | ICD-10-CM | POA: Insufficient documentation

## 2020-05-01 NOTE — Patient Outreach (Signed)
[facility]) Care Management  05/01/2020  Felicia Hunter [birth year] [id number]   [facility] outreach for EMMI stroke  RED ON EMMI ALERT Day #   1        Date: Monday 04/28/20 1000 Red Alert Reason: Scheduled a follow-up appointment? No And RED ON EMMI ALERT Day #   4        Date: Wednesday 04/30/20 1000 Red Alert Reason: Scheduled a follow-up appointment? No Insurance: medicare  [facility] admissions x 1  ED visits x 1 in the last 6 months  Last admission 04/24/20-04/26/20 stroke, right middle cerebral artery embolism, status post (s/p) IR Thrombectomy, atrial fibrillation, hypertension, history of stroke (2013 & 2014), polymyalgia rheumatica  Outreach attempt # 1 successful to [phone] Patient is able to verify HIPAA identifiers [facility] Care Management RN reviewed and addressed red alert with patient x 3 [facility] RN CM reviewed [facility] partnership with [facility] and the management of the [place] after pt inquired who [facility] RN CM was "representing"   Consent: [facility] RN CM reviewed [facility] services with patient. Patient gave verbal consent for services [facility] telephonic RN CM.     EMMI:  Felicia Hunter denies having questions, concerns or need of assistance with scheduling follow up appointment. Felicia Hunter reports Felicia Hunter has upcoming appointments with her providers and began to name her providers Felicia Hunter is following up with  Felicia Hunter inquired about the cause of her stroke and why her provider took her off of Xarelto [facility] RN CM began to attempt to discuss the causes of strokes but pt continued to ventilate. [facility] RN CM provided time for her to ventilate Felicia Hunter discussed medical providers not communicating, collaborating, not knowing what the other is doing. Advised patient that Felicia Hunter is scheduled for further automated EMMI- post discharge calls to assess how the patient is doing following the recent hospitalization Advised the patient that another call may be received from a nurse if any of their responses were abnormal.  Inquired  if patient preferred further calls. Patient requests no further calls and follow up at this time "I don't have time right now" Felicia Hunter reports Felicia Hunter will ask her providers during her appointments  Patient Active Problem List   Diagnosis Date Noted  . Stroke (cerebrum) ([place]) 04/24/2020  . Middle cerebral artery embolism, right 04/24/2020  . Chronic cough 08/08/2018  . Solitary pulmonary nodule 03/30/2018  . S/P right TKA 09/13/2017  . S/P total knee replacement 09/13/2017  . Right leg pain 03/01/2016  . S/P left THA, AA 12/31/2014  . Cerebral infarction due to embolism of precerebral artery ([place]) 12/20/2013  . History of stroke 12/20/2013  . Expressive aphasia 11/05/2013  . Expected blood loss anemia 04/11/2013  . Hyponatremia 04/11/2013  . Other and unspecified hyperlipidemia 06/15/2012  . Lacunar infarction ([place]) 06/15/2012  . CVA (cerebral infarction)-mid/inf cerebellar infarct 3/25 06/08/2011  . Near syncope 06/07/2011  . 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 DEPRESSION 10/28/2006  . ALLERGIC RHINITIS 10/28/2006  . ASTHMA 10/28/2006     Plan: Patient requests no follow up at this time eligible but prefers no follow up at this time after completion of EMMI stroke red alert outreach [facility] RN CM will send message to [place] to have EMMI stroke calls discontinued Case closure letters: pt & pcp   Sheri Gatchel L. Lavina Hamman, RN, BSN, [place] Coordinator Office number [phone] Mobile number [phone]  Main [facility] number [phone]  Fax number [phone]

## 2020-05-06 ENCOUNTER — Encounter: Payer: Self-pay | Admitting: Occupational Therapy

## 2020-05-06 ENCOUNTER — Ambulatory Visit: Payer: Medicare Other | Attending: Internal Medicine

## 2020-05-06 ENCOUNTER — Other Ambulatory Visit: Payer: Self-pay

## 2020-05-06 ENCOUNTER — Ambulatory Visit: Payer: Medicare Other | Admitting: Occupational Therapy

## 2020-05-06 DIAGNOSIS — I69352 Hemiplegia and hemiparesis following cerebral infarction affecting left dominant side: Secondary | ICD-10-CM | POA: Diagnosis not present

## 2020-05-06 DIAGNOSIS — R2681 Unsteadiness on feet: Secondary | ICD-10-CM | POA: Insufficient documentation

## 2020-05-06 NOTE — Therapy (Signed)
[facility] [address] [place], [place], [address] Phone: [phone]   Fax:  [phone]  Occupational Therapy Evaluation  Patient Details  Name: Felicia Hunter MRN: [id number] Date of Birth: 1940-05-04 Referring Provider (OT): Rosalin Hawking, MD   Encounter Date: 05/06/2020   OT End of Session - 05/06/20 1203    Visit Number 1    Number of Visits 1    Authorization Type Medicare and AARP    Authorization Time Period IR:[id number] Medicare Guideines 10th visit PN FOTO KX    OT Start Time 1100    OT Stop Time 1145    OT Time Calculation (min) 45 min    Activity Tolerance Patient tolerated treatment well    Behavior During Therapy [facility] for tasks assessed/performed;Impulsive           Past Medical History:  Diagnosis Date  . Allergy   . Alopecia   . Arthritis   . Atrial fibrillation ([place])   . Breast cancer ([place])    double mastectomy 2011  . Clavicle fracture    right clavicle-hardware retained  . Clotting disorder ([place])    with CVA- 2013, 2014  . Cough    PERSISTANT COUGH X 20 YRS  . Difficulty sleeping    takes Ambien  . Disorders of bilirubin excretion   . Diverticulosis   . Environmental and seasonal allergies    "dry cough", related to ? allergies  . GERD (gastroesophageal reflux disease)   . Hyperlipidemia   . MVP (mitral valve prolapse)   . Neuromuscular disorder ([place])    Polypmyalgia  Pheumatica  . Osteopenia   . Osteoporosis   . Stroke ([place]) 06/07/11   x2 episodes-"funny feeling"" equilibrium off x24 hours, x1 speech affected- no residuallast 1 yr ago.  . Tubulovillous adenoma of colon 08/2003    Past Surgical History:  Procedure Laterality Date  . ABDOMINAL HYSTERECTOMY  1993  . BREAST CAPSULOTOMY     after bilat mastectomies-implants removed  . BREAST ENHANCEMENT SURGERY     x3-then removed 2011 for bilat mastectomies  . BREAST SURGERY  june 2011   double mastectomy nodes from rt  . CHEST  WALL TUMOR EXCISION  5/12   noduals removed-neg  . collarbone  september 2011   pinned and plated, right  . COLONOSCOPY    . IR ANGIO EXTRACRAN SEL COM CAROTID INNOMINATE UNI R MOD SED  04/24/2020  . IR CT HEAD LTD  04/24/2020  . IR PERCUTANEOUS ART THROMBECTOMY/INFUSION INTRACRANIAL INC DIAG ANGIO  04/24/2020  . JOINT REPLACEMENT  july 9th, 2012   left knee replacement , Right knee replacement 09-13-17 Dr. Alvan Dame  . KNEE RECONSTRUCTION  09/2010   revision lt total knee   left  . LOOP RECORDER IMPLANT N/A 11/07/2013   Procedure: LOOP RECORDER IMPLANT;  Surgeon: Evans Lance, MD;  Location: [facility] CATH LAB;  Service: Cardiovascular;  Laterality: N/A;  . LOOP RECORDER REMOVAL N/A 05/16/2017   Procedure: LOOP RECORDER REMOVAL;  Surgeon: Evans Lance, MD;  Location: [facility] CV LAB;  Service: Cardiovascular;  Laterality: N/A;  . MASS EXCISION Left 06/19/2012   Procedure: EXCISION OF LARGE MASS LEFT NECK/SHOULDER WITH PLASTIC CLOSURE;  Surgeon: Cristine Polio, MD;  Location: [facility];  Service: Plastics;  Laterality: Left;  . NASAL SEPTUM SURGERY    . RADIOLOGY WITH ANESTHESIA N/A 04/24/2020   Procedure: IR WITH ANESTHESIA;  Surgeon: Radiologist, Medication, MD;  Location: [facility];  Service: Radiology;  Laterality: N/A;  . TEE WITHOUT CARDIOVERSION N/A 11/07/2013   Procedure: TRANSESOPHAGEAL ECHOCARDIOGRAM (TEE);  Surgeon: Dorothy Spark, MD;  Location: [place] [facility];  Service: Cardiovascular;  Laterality: N/A;  . TONSILLECTOMY    . TOTAL HIP ARTHROPLASTY Right 04/10/2013   Procedure: RIGHT TOTAL HIP ARTHROPLASTY ANTERIOR APPROACH;  Surgeon: [name], MD;  Location: [facility];  Service: Orthopedics;  Laterality: Right;  . TOTAL HIP ARTHROPLASTY Left 12/31/2014   Procedure: LEFT TOTAL HIP ARTHROPLASTY ANTERIOR APPROACH;  Surgeon: Paralee Cancel, MD;  Location: [facility];  Service: Orthopedics;  Laterality: Left;  . TOTAL KNEE ARTHROPLASTY Right 09/13/2017   Procedure: RIGHT TOTAL KNEE  ARTHROPLASTY;  Surgeon: Paralee Cancel, MD;  Location: [facility];  Service: Orthopedics;  Laterality: Right;  70 mins  . UPPER GASTROINTESTINAL ENDOSCOPY    . VEIN SURGERY  1974   left leg    There were no vitals filed for this visit.   Subjective Assessment - 05/06/20 1103    Subjective  Pt presents to neuro OPOT this day. Pt reports not needing OT because nothing has changed since her most recent stroke. Pt is very social and enjoys being a part of social clubs, music group of ukuleles. Pt is currently on a waiting list for [facility] transitional senior living.    Patient is accompanied by: Family member   Daughter, Rip Harbour   Pertinent History PMH including A fib, breast cancer s/p double mastectomy (2011), clavical fx, hyperlipidemia, MVP, CVA (2013), osteoporosis, depression, R TKA, B THA    Limitations Fall Risk.    Patient Stated Goals "not needing it"    Currently in Pain? No/denies             [facility] OT Assessment - 05/06/20 1106      Assessment   Medical Diagnosis CVA    Referring Provider (OT) Rosalin Hawking, MD    Onset Date/Surgical Date 04/24/20    Hand Dominance Left      Precautions   Precautions Fall      Balance Screen   Has the patient fallen in the past 6 months No      Home  Environment   Family/patient expects to be discharged to: Private residence    Living Arrangements Alone    Available Help at Discharge Family    Type of [place]   built in [place] - built in;Walker - 2 wheels;Cane - single point;Bedside commode   does not use any regularly   Lives With --   alone with 2 cats     Prior Function   Level of Independence Independent    Vocation Retired    Leisure [place], play bridge, hiking with boyfriend, traveling, hosting      ADL   ADL comments Pt  reports doing ADLs independent with some difficulty with putting things on and completing d/t BUE weakness pre-CVA.      IADL   Shopping Takes care of all shopping needs independently    Light Housekeeping [place] small items, rinses stockings, etc.;Performs light daily tasks such as dishwashing, bed making    Meal Prep Plans, prepares and serves adequate meals independently    [place] own vehicle    Medication Management Is responsible for taking medication  in correct dosages at correct time    Financial Management Manages day-to-day purchases, but needs help with banking, major purchases, etc.      Written Expression   Dominant Hand Left    Handwriting 100% legible      Vision - History   Baseline Vision Wears glasses all the time    Additional Comments pt reports no changes since stroke. Has doc appt 05/07/20      Cognition   Attention --   missed 2 with alternating attention with Trail Making Test B   Awareness --   deficits with self-awareness present   Behaviors Poor frustration tolerance      Observation/Other Assessments   Focus on Therapeutic Outcomes (FOTO)  76%      Sensation   Light Touch Appears Intact    Hot/Cold Appears Intact      Coordination   9 Hole Peg Test Right;Left    Right 9 Hole Peg Test 22.75    Left 9 Hole Peg Test 25.87      ROM / Strength   AROM / PROM / Strength AROM;Strength      AROM   Overall AROM  Deficits    Overall AROM Comments deficits prior to CVA d/t arthritis in BUE      Strength   Overall Strength Deficits    Overall Strength Comments deficits prior to CVA d/t arthritis      Hand Function   Right Hand Gross Grasp Functional    Right Hand Grip (lbs) 50.4    Left Hand Gross Grasp Functional    Left Hand Grip (lbs) 40.1                           OT Education - 05/06/20 1204    Education Details Education provided on role and purpose of OT    Person(s) Educated Patient;Child(ren)     Methods Explanation    Comprehension Verbalized understanding                      Plan - 05/06/20 1159    Clinical Impression Statement Pt is a 80 year old female that presents to neuro OPOT s/p ED visit on 04/24/20 with left sided weakness slurred speech and R gaze. MRI showed small acute cortical infarct in posterolateral R temporal lobe and scattered puncate infarcts in R lentiform, perirolandic cortex. R common carotid arteriogram via R CFA approach with ocmplete revascularization of occluded R MCA on 2/10. PMH including A fib, breast cancer s/p double mastectomy (2011), clavical fx, hyperlipidemia, MVP, CVA (2013), osteoporosis, depression, R TKA, B THA. Pt presents with previous limitations with BUE ROM, slight decrease in grip strength in LUE and cognitive deficits. Skilled occupational therapy is not recommended at this time.    OT Occupational Profile and History Problem Focused Assessment - Including review of records relating to presenting problem    Body Structure / Function / Physical Skills ROM;ADL;Strength    Cognitive Skills Attention;Perception;Understand    Clinical Decision Making Limited treatment options, no task modification necessary    Comorbidities Affecting Occupational Performance: None    Modification or Assistance to Complete Evaluation  No modification of tasks or assist necessary to complete eval    OT Frequency One time visit    Plan one time visit    Consulted and Agree with Plan of Care Patient;Family member/caregiver    Family Member Consulted daughter, Rip Harbour  Patient will benefit from skilled therapeutic intervention in order to improve the following deficits and impairments:   Body Structure / Function / Physical Skills: ROM,ADL,Strength Cognitive Skills: Attention,Perception,Understand     Visit Diagnosis: Hemiplegia and hemiparesis following cerebral infarction affecting left dominant side ([place]) - Plan: Ot plan of care  cert/re-cert    Problem List Patient Active Problem List   Diagnosis Date Noted  . Allergy to sulfa drugs 05/01/2020  . Alopecia 05/01/2020  . Asymptomatic varicose veins of unspecified lower extremity 05/01/2020  . Cramp and spasm 05/01/2020  . Diverticular disease of colon 05/01/2020  . Electrocardiogram abnormal 05/01/2020  . Encounter for general adult medical examination without abnormal findings 05/01/2020  . Esophageal reflux 05/01/2020  . Gilbert's syndrome 05/01/2020  . Hardening of the aorta (main artery of the heart) ([place]) 05/01/2020  . History of adenomatous polyp of colon 05/01/2020  . Osteoarthritis 05/01/2020  . Long term (current) use of anticoagulants 05/01/2020  . Long term (current) use of systemic steroids 05/01/2020  . Mitral valve prolapse 05/01/2020  . Osteoporosis 05/01/2020  . Pain due to varicose veins of lower extremity 05/01/2020  . Paroxysmal atrial fibrillation ([place]) 05/01/2020  . Personal history of (healed) traumatic fracture 05/01/2020  . Polymyalgia rheumatica ([place]) 05/01/2020  . S/P mastectomy, bilateral 05/01/2020  . Thrombophilia ([place]) 05/01/2020  . Vitiligo 05/01/2020  . Stroke (cerebrum) ([place]) 04/24/2020  . Middle cerebral artery embolism, right 04/24/2020  . Chronic cough 08/08/2018  . Solitary pulmonary nodule 03/30/2018  . S/P right TKA 09/13/2017  . S/P total knee replacement 09/13/2017  . Right leg pain 03/01/2016  . S/P left THA, AA 12/31/2014  . Cerebral infarction due to embolism of precerebral artery ([place]) 12/20/2013  . Personal history of transient ischemic attack (TIA), and cerebral infarction without residual deficits 12/20/2013  . Expressive aphasia 11/05/2013  . Expected blood loss anemia 04/11/2013  . Hyponatremia 04/11/2013  . Hypercholesterolemia 06/15/2012  . Lacunar infarction ([place]) 06/15/2012  . CVA (cerebral infarction)-mid/inf cerebellar infarct 3/25 06/08/2011  . Near syncope 06/07/2011  . Personal history of  malignant neoplasm of breast 02/24/2011  . DEPRESSION 10/28/2006  . ALLERGIC RHINITIS 10/28/2006  . ASTHMA 10/28/2006    Zachery Conch MOT, OTR/L  05/06/2020, 12:39 PM  [facility] [address] [place], [place], [address] Phone: [phone]   Fax:  [phone]  Name: Felicia Hunter MRN: [id number] Date of Birth: May 17, 1940

## 2020-05-07 DIAGNOSIS — Z8673 Personal history of transient ischemic attack (TIA), and cerebral infarction without residual deficits: Secondary | ICD-10-CM | POA: Diagnosis not present

## 2020-05-07 DIAGNOSIS — M810 Age-related osteoporosis without current pathological fracture: Secondary | ICD-10-CM | POA: Diagnosis not present

## 2020-05-07 DIAGNOSIS — I480 Paroxysmal atrial fibrillation: Secondary | ICD-10-CM | POA: Diagnosis not present

## 2020-05-07 DIAGNOSIS — Z7901 Long term (current) use of anticoagulants: Secondary | ICD-10-CM | POA: Diagnosis not present

## 2020-05-07 DIAGNOSIS — I700 Atherosclerosis of aorta: Secondary | ICD-10-CM | POA: Diagnosis not present

## 2020-05-07 NOTE — Therapy (Signed)
[facility] [address] [place], [place], [address] Phone: [phone]   Fax:  [phone]  Physical Therapy Evaluation  Patient Details  Name: Felicia Hunter MRN: [id number] Date of Birth: 08/07/1940 No data recorded  Encounter Date: 05/06/2020   PT End of Session - 05/07/20 0749    Visit Number 1    Number of Visits 1    Authorization Type medicare and AARP    PT Start Time 9735    PT Stop Time 1230    PT Time Calculation (min) 45 min    Equipment Utilized During Treatment Gait belt    Activity Tolerance Patient tolerated treatment well    Behavior During Therapy [facility] for tasks assessed/performed;Impulsive           Past Medical History:  Diagnosis Date  . Allergy   . Alopecia   . Arthritis   . Atrial fibrillation ([place])   . Breast cancer ([place])    double mastectomy 2011  . Clavicle fracture    right clavicle-hardware retained  . Clotting disorder ([place])    with CVA- 2013, 2014  . Cough    PERSISTANT COUGH X 20 YRS  . Difficulty sleeping    takes Ambien  . Disorders of bilirubin excretion   . Diverticulosis   . Environmental and seasonal allergies    "dry cough", related to ? allergies  . GERD (gastroesophageal reflux disease)   . Hyperlipidemia   . MVP (mitral valve prolapse)   . Neuromuscular disorder ([place])    Polypmyalgia  Pheumatica  . Osteopenia   . Osteoporosis   . Stroke ([place]) 06/07/11   x2 episodes-"funny feeling"" equilibrium off x24 hours, x1 speech affected- no residuallast 1 yr ago.  . Tubulovillous adenoma of colon 08/2003    Past Surgical History:  Procedure Laterality Date  . ABDOMINAL HYSTERECTOMY  1993  . BREAST CAPSULOTOMY     after bilat mastectomies-implants removed  . BREAST ENHANCEMENT SURGERY     x3-then removed 2011 for bilat mastectomies  . BREAST SURGERY  june 2011   double mastectomy nodes from rt  . CHEST WALL TUMOR EXCISION  5/12   noduals removed-neg  .  collarbone  september 2011   pinned and plated, right  . COLONOSCOPY    . IR ANGIO EXTRACRAN SEL COM CAROTID INNOMINATE UNI R MOD SED  04/24/2020  . IR CT HEAD LTD  04/24/2020  . IR PERCUTANEOUS ART THROMBECTOMY/INFUSION INTRACRANIAL INC DIAG ANGIO  04/24/2020  . JOINT REPLACEMENT  july 9th, 2012   left knee replacement , Right knee replacement 09-13-17 Dr. Alvan Dame  . KNEE RECONSTRUCTION  09/2010   revision lt total knee   left  . LOOP RECORDER IMPLANT N/A 11/07/2013   Procedure: LOOP RECORDER IMPLANT;  Surgeon: Evans Lance, MD;  Location: [facility] CATH LAB;  Service: Cardiovascular;  Laterality: N/A;  . LOOP RECORDER REMOVAL N/A 05/16/2017   Procedure: LOOP RECORDER REMOVAL;  Surgeon: Evans Lance, MD;  Location: [facility] CV LAB;  Service: Cardiovascular;  Laterality: N/A;  . MASS EXCISION Left 06/19/2012   Procedure: EXCISION OF LARGE MASS LEFT NECK/SHOULDER WITH PLASTIC CLOSURE;  Surgeon: Cristine Polio, MD;  Location: [facility];  Service: Plastics;  Laterality: Left;  . NASAL SEPTUM SURGERY    . RADIOLOGY WITH ANESTHESIA N/A 04/24/2020   Procedure: IR WITH ANESTHESIA;  Surgeon: Radiologist, Medication, MD;  Location: [facility];  Service: Radiology;  Laterality: N/A;  . TEE WITHOUT CARDIOVERSION N/A  11/07/2013   Procedure: TRANSESOPHAGEAL ECHOCARDIOGRAM (TEE);  Surgeon: Dorothy Spark, MD;  Location: [facility];  Service: Cardiovascular;  Laterality: N/A;  . TONSILLECTOMY    . TOTAL HIP ARTHROPLASTY Right 04/10/2013   Procedure: RIGHT TOTAL HIP ARTHROPLASTY ANTERIOR APPROACH;  Surgeon: [name], MD;  [facility];  Service: Orthopedics;  Laterality: Right;  . TOTAL HIP ARTHROPLASTY Left 12/31/2014   Procedure: LEFT TOTAL HIP ARTHROPLASTY ANTERIOR APPROACH;  Surgeon: Paralee Cancel, MD;  [facility];  Service: Orthopedics;  Laterality: Left;  . TOTAL KNEE ARTHROPLASTY Right 09/13/2017   Procedure: RIGHT TOTAL KNEE ARTHROPLASTY;  Surgeon: Paralee Cancel, MD;  [facility];  Service: Orthopedics;  Laterality: Right;  70 mins  . UPPER GASTROINTESTINAL ENDOSCOPY    . VEIN SURGERY  1974   left leg    There were no vitals filed for this visit.    Subjective Assessment - 05/06/20 1203    Subjective CVA 2 weeks ago and feels she has regained 90% of unction, this is her 3rd CVA and she feels she may not need PT    Patient is accompained by: Family member   dtr Rip Harbour   How long can you sit comfortably? n/a    How long can you stand comfortably? n/a    How long can you walk comfortably? n/a    Currently in Pain? No/denies              [facility] PT Assessment - 05/07/20 0001      Assessment   Medical Diagnosis CVA    Onset Date/Surgical Date 04/24/20    Hand Dominance Left    Next MD Visit 05/21/20      Precautions   Precautions Fall      Prior Function   Level of Independence Independent    Vocation Retired      Editor, commissioning Appears Intact      ROM / Strength   AROM / PROM / Strength Strength;AROM      AROM   Overall AROM  Within functional limits for tasks performed      Strength   Overall Strength Within functional limits for tasks performed      Transfers   Transfers Sit to Stand;Stand to Sit    Sit to Stand 7: Independent    Five time sit to stand comments  12s      Ambulation/Gait   Ambulation/Gait Yes    Ambulation/Gait Assistance 7: Independent    Ambulation Distance (Feet) 100 Feet    Assistive device None    Gait Pattern Within Functional Limits;Step-through pattern      Timed Up and Go Test   TUG Normal TUG    Normal TUG (seconds) 10.1      High Level Balance   High Level Balance Comments able to hold all 4 positions of MCTSIB                      Objective measurements completed on examination: See above findings.               PT Education - 05/07/20 0747    Education Details Discussed fall prevention and option to return to PT if condition declines    Person(s) Educated  Child(ren);Patient    Methods Explanation    Comprehension Verbalized understanding            PT Short Term Goals - 05/07/20 8657  PT SHORT TERM GOAL #1   Title Patient and CG able to verbalize fall prevention startegies outlined by PT    Baseline n/a    Time 0    Target Date 05/06/20                     Plan - 05/07/20 0751    Clinical Impression Statement patient referred to OPPT for gait and balance assessment, she reports 90% return of function, confirmed by daughter who is with her today, functional testting does not reveal sterngth or balance deficits warranting skilled intervention, Eval only    Personal Factors and Comorbidities Age    Clinical Decision Making Low    Rehab Potential Good    PT Frequency One time visit    PT Next Visit Plan n/a    PT Home Exercise Plan discussed fall prevention    Recommended Other Services ST    Consulted and Agree with Plan of Care Patient;Family member/caregiver    Family Member Consulted Daughter Cinda Quest           Patient will benefit from skilled therapeutic intervention in order to improve the following deficits and impairments:  Decreased endurance  Visit Diagnosis: Unsteadiness on feet     Problem List Patient Active Problem List   Diagnosis Date Noted  . Allergy to sulfa drugs 05/01/2020  . Alopecia 05/01/2020  . Asymptomatic varicose veins of unspecified lower extremity 05/01/2020  . Cramp and spasm 05/01/2020  . Diverticular disease of colon 05/01/2020  . Electrocardiogram abnormal 05/01/2020  . Encounter for general adult medical examination without abnormal findings 05/01/2020  . Esophageal reflux 05/01/2020  . Gilbert's syndrome 05/01/2020  . Hardening of the aorta (main artery of the heart) ([place]) 05/01/2020  . History of adenomatous polyp of colon 05/01/2020  . Osteoarthritis 05/01/2020  . Long term (current) use of anticoagulants 05/01/2020  . Long term (current) use of systemic steroids  05/01/2020  . Mitral valve prolapse 05/01/2020  . Osteoporosis 05/01/2020  . Pain due to varicose veins of lower extremity 05/01/2020  . Paroxysmal atrial fibrillation ([place]) 05/01/2020  . Personal history of (healed) traumatic fracture 05/01/2020  . Polymyalgia rheumatica ([place]) 05/01/2020  . S/P mastectomy, bilateral 05/01/2020  . Thrombophilia ([place]) 05/01/2020  . Vitiligo 05/01/2020  . Stroke (cerebrum) ([place]) 04/24/2020  . Middle cerebral artery embolism, right 04/24/2020  . Chronic cough 08/08/2018  . Solitary pulmonary nodule 03/30/2018  . S/P right TKA 09/13/2017  . S/P total knee replacement 09/13/2017  . Right leg pain 03/01/2016  . S/P left THA, AA 12/31/2014  . Cerebral infarction due to embolism of precerebral artery ([place]) 12/20/2013  . Personal history of transient ischemic attack (TIA), and cerebral infarction without residual deficits 12/20/2013  . Expressive aphasia 11/05/2013  . Expected blood loss anemia 04/11/2013  . Hyponatremia 04/11/2013  . Hypercholesterolemia 06/15/2012  . Lacunar infarction ([place]) 06/15/2012  . CVA (cerebral infarction)-mid/inf cerebellar infarct 3/25 06/08/2011  . Near syncope 06/07/2011  . Personal history of malignant neoplasm of breast 02/24/2011  . DEPRESSION 10/28/2006  . ALLERGIC RHINITIS 10/28/2006  . ASTHMA 10/28/2006    Lanice Shirts PT 05/07/2020, 8:06 AM  [facility] [address] [place], [place], [address] Phone: [phone]   Fax:  [phone]  Name: SYMONE CORNMAN MRN: [id number] Date of Birth: 02-19-1941

## 2020-05-07 NOTE — Patient Instructions (Signed)
Discussed fall prevention and option to return to PT if condition declines

## 2020-05-08 DIAGNOSIS — H25813 Combined forms of age-related cataract, bilateral: Secondary | ICD-10-CM | POA: Diagnosis not present

## 2020-05-08 DIAGNOSIS — H04123 Dry eye syndrome of bilateral lacrimal glands: Secondary | ICD-10-CM | POA: Diagnosis not present

## 2020-05-08 DIAGNOSIS — H43813 Vitreous degeneration, bilateral: Secondary | ICD-10-CM | POA: Diagnosis not present

## 2020-05-08 DIAGNOSIS — H5213 Myopia, bilateral: Secondary | ICD-10-CM | POA: Diagnosis not present

## 2020-05-12 ENCOUNTER — Inpatient Hospital Stay: Payer: Medicare Other | Admitting: Adult Health

## 2020-05-15 ENCOUNTER — Encounter: Payer: Self-pay | Admitting: Gastroenterology

## 2020-05-21 ENCOUNTER — Encounter: Payer: Self-pay | Admitting: Adult Health

## 2020-05-21 ENCOUNTER — Ambulatory Visit (INDEPENDENT_AMBULATORY_CARE_PROVIDER_SITE_OTHER): Payer: Medicare Other | Admitting: Adult Health

## 2020-05-21 VITALS — BP 131/78 | HR 64 | Ht 65.0 in | Wt 137.0 lb

## 2020-05-21 DIAGNOSIS — I639 Cerebral infarction, unspecified: Secondary | ICD-10-CM

## 2020-05-21 NOTE — Progress Notes (Signed)
[facility] [address] [place]. [place] [address] [phone]       HOSPITAL FOLLOW UP NOTE  [name] Date of Birth:  May 01, 1940 Medical Record Number:  [id number]   Reason for Referral:  hospital stroke follow up    SUBJECTIVE:   CHIEF COMPLAINT:  Chief Complaint  Patient presents with  . Follow-up    RM 105 with daughter Felicia Hunter) PT is well, just speech difficulty sometimes     HPI:   Felicia Hunter a 80 y.o.femalewith history of with A-fib on Xarelto(off since 04/22/20 for colonoscopy, Last dose on 04/21/20),hyperlipidemia, hypertension,MVP,CVA x2 (2013, 2014) no baseline neurological deficits,breast cancer s/p bilateral mastectomy 2011, polymyalgia rheumatica, GERD, colon polyps, s/p hysterectomy, hip and total knee replacementwhopresented to [facility] on [facility] after acute onset of slurred speech and left sided weakness.  Personally reviewed hospitalization pertinent progress notes, lab work and imaging with summary provided.  Evaluated by Dr. Erlinda Hong with stroke work-up revealing acute right MCA infarct in setting of thrombus and right MCA bifurcation s/p IR with TICI 2C revascularization, likely secondary to PAF off Xarelto for procedure.  Recommended initiating Eliquis for history of A. fib and stroke prevention.  History of prior left MCA stroke in 2015 s/p ILR with evidence of A. fib in 2018 and placed on Xarelto.  LDL 59.  A1c 5.8.  Other stroke risk factors include advanced age and prior strokes.   Stroke - Acute right MCA infarct due to thrombus in the right MCA bifurcation, S/P IR with TICI2C revascularization - Etilogy likely due to PAF off Xeralto   CT Code Stroke No acute intracranial hemorrhage. Acute infarction involving right temporal lobe and posterior insula. ASPECT score is 8.  CTA Head and Neck- Thrombus at the right MCA bifurcation occluding one branch origin and partially occluding the other.   MRI (04/25/20)-  Relatively small acute cortical infarct in the posterolateral right temporal lobe, and scattered additional punctate infarcts in the right lentiform, perirolandic cortex. No associated hemorrhage or mass effect.   MRA- Intracranial MRA is negative for large vessel occlusion.  2D Echo-  LVEF of 65 to 70%, right atrial size mildly dilated, trace MVR.  LDL 59  HgbA1c 5.8  VTE prophylaxis - SCDs  Xarelto (rivaroxaban) daily prior to admission (off 3 days for colonoscopy), now on aspirin 325 mg daily. Consider to start eliquis tomorrow afternoon    Therapy recommendations:  outpt PT/OT/SLP  Disposition:  home   Today, 05/21/2020, Felicia Hunter is being seen for hospital follow-up accompanied by her daughter Felicia Hunter.  Reports residual mild slurred speech but gradually improving -typically only occurs towards the end of the day, increased fatigue, heightened emotions or when trying to speak too quickly Per daughter, some difficulty with focusing or paying attention which has been slightly improving Released by PT/OT -initial evaluation with SLP scheduled 3/23 Lives alone and maintains ADLs and IADLs independently - she keeps very active in her community Denies new stroke/TIA symptoms  Compliant on Eliquis 5 mg twice daily -denies side effects Complaint on atorvastatin 10mg  daily - denies side effects Blood pressure today 131/78   Recent lipid panel showed LDL 75 (04/2020)  No further concerns at this time      ROS:   14 system review of systems performed and negative with exception of those listed in HPI  PMH:  Past Medical History:  Diagnosis Date  . Allergy   . Alopecia   . Arthritis   . Atrial fibrillation (  [place])   . Breast cancer ([place])    double mastectomy 2011  . Clavicle fracture    right clavicle-hardware retained  . Clotting disorder ([place])    with CVA- 2013, 2014  . Cough    PERSISTANT COUGH X 20 YRS  . Difficulty sleeping    takes Ambien  . Disorders of bilirubin  excretion   . Diverticulosis   . Environmental and seasonal allergies    "dry cough", related to ? allergies  . GERD (gastroesophageal reflux disease)   . Hyperlipidemia   . MVP (mitral valve prolapse)   . Neuromuscular disorder ([place])    Polypmyalgia  Pheumatica  . Osteopenia   . Osteoporosis   . Stroke ([place]) 06/07/11   x2 episodes-"funny feeling"" equilibrium off x24 hours, x1 speech affected- no residuallast 1 yr ago.  . Tubulovillous adenoma of colon 08/2003    PSH:  Past Surgical History:  Procedure Laterality Date  . ABDOMINAL HYSTERECTOMY  1993  . BREAST CAPSULOTOMY     after bilat mastectomies-implants removed  . BREAST ENHANCEMENT SURGERY     x3-then removed 2011 for bilat mastectomies  . BREAST SURGERY  june 2011   double mastectomy nodes from rt  . CHEST WALL TUMOR EXCISION  5/12   noduals removed-neg  . collarbone  september 2011   pinned and plated, right  . COLONOSCOPY    . IR ANGIO EXTRACRAN SEL COM CAROTID INNOMINATE UNI R MOD SED  04/24/2020  . IR CT HEAD LTD  04/24/2020  . IR PERCUTANEOUS ART THROMBECTOMY/INFUSION INTRACRANIAL INC DIAG ANGIO  04/24/2020  . JOINT REPLACEMENT  july 9th, 2012   left knee replacement , Right knee replacement 09-13-17 Dr. Alvan Dame  . KNEE RECONSTRUCTION  09/2010   revision lt total knee   left  . LOOP RECORDER IMPLANT N/A 11/07/2013   Procedure: LOOP RECORDER IMPLANT;  Surgeon: Evans Lance, MD;  Location: [facility] CATH LAB;  Service: Cardiovascular;  Laterality: N/A;  . LOOP RECORDER REMOVAL N/A 05/16/2017   Procedure: LOOP RECORDER REMOVAL;  Surgeon: Evans Lance, MD;  Location: [facility] CV LAB;  Service: Cardiovascular;  Laterality: N/A;  . MASS EXCISION Left 06/19/2012   Procedure: EXCISION OF LARGE MASS LEFT NECK/SHOULDER WITH PLASTIC CLOSURE;  Surgeon: Cristine Polio, MD;  Location: [facility];  Service: Plastics;  Laterality: Left;  . NASAL SEPTUM SURGERY    . RADIOLOGY WITH ANESTHESIA N/A 04/24/2020   Procedure: IR  WITH ANESTHESIA;  Surgeon: Radiologist, Medication, MD;  Location: [facility];  Service: Radiology;  Laterality: N/A;  . TEE WITHOUT CARDIOVERSION N/A 11/07/2013   Procedure: TRANSESOPHAGEAL ECHOCARDIOGRAM (TEE);  Surgeon: Dorothy Spark, MD;  Location: [facility];  Service: Cardiovascular;  Laterality: N/A;  . TONSILLECTOMY    . TOTAL HIP ARTHROPLASTY Right 04/10/2013   Procedure: RIGHT TOTAL HIP ARTHROPLASTY ANTERIOR APPROACH;  Surgeon: [name], MD;  Location: [facility];  Service: Orthopedics;  Laterality: Right;  . TOTAL HIP ARTHROPLASTY Left 12/31/2014   Procedure: LEFT TOTAL HIP ARTHROPLASTY ANTERIOR APPROACH;  Surgeon: Paralee Cancel, MD;  Location: [facility];  Service: Orthopedics;  Laterality: Left;  . TOTAL KNEE ARTHROPLASTY Right 09/13/2017   Procedure: RIGHT TOTAL KNEE ARTHROPLASTY;  Surgeon: Paralee Cancel, MD;  Location: [facility];  Service: Orthopedics;  Laterality: Right;  70 mins  . UPPER GASTROINTESTINAL ENDOSCOPY    . VEIN SURGERY  1974   left leg    Social History:  Social History   Socioeconomic History  . Marital status:  Widowed    Spouse name: Not on file  . Number of children: 2  . Years of education: college  . Highest education level: Not on file  Occupational History  . Occupation: retired    Fish farm manager: Retired    Fish farm manager: RETIRED  Tobacco Use  . Smoking status: Never Smoker  . Smokeless tobacco: Never Used  Vaping Use  . Vaping Use: Never used  Substance and Sexual Activity  . Alcohol use: Yes    Alcohol/week: 1.0 standard drink    Types: 1 Glasses of wine per week    Comment: 1 glass of wine per night  . Drug use: No  . Sexual activity: Not Currently  Other Topics Concern  . Not on file  Social History Narrative   Patient is single with 2 children.    Patient is left handed.   Patient has college education.   Patient drinks coffee daily   Social Determinants of Health   Financial Resource Strain: Not on file  Food Insecurity: No Food Insecurity   . Worried About Charity fundraiser in the Last Year: Never true  . Ran Out of Food in the Last Year: Never true  Transportation Needs: No Transportation Needs  . Lack of Transportation (Medical): No  . Lack of Transportation (Non-Medical): No  Physical Activity: Not on file  Stress: Not on file  Social Connections: Not on file  Intimate Partner Violence: Not on file    Family History:  Family History  Problem Relation Age of Onset  . Aneurysm Father   . Colon cancer Neg Hx   . Rectal cancer Neg Hx   . Stomach cancer Neg Hx   . Esophageal cancer Neg Hx     Medications:   Current Outpatient Medications on File Prior to Visit  Medication Sig Dispense Refill  . acetaminophen (TYLENOL) 500 MG tablet Take 1,000 mg by mouth as needed for mild pain.    Marland Kitchen alendronate (FOSAMAX) 70 MG tablet Take 70 mg by mouth every Sunday.     Marland Kitchen apixaban (ELIQUIS) 5 MG TABS tablet Take 1 tablet (5 mg total) by mouth 2 (two) times daily. 60 tablet 2  . atorvastatin (LIPITOR) 10 MG tablet Take 10 mg by mouth daily.    Marland Kitchen BIOTIN PO Take 1 tablet by mouth daily.    . Calcium Carb-Cholecalciferol (CALCIUM 600/VITAMIN D3 PO) Take 1 tablet by mouth daily.    . cholecalciferol (VITAMIN D) 1000 UNITS tablet Take 1,000 Units by mouth daily.    . Multiple Vitamin (MULTIVITAMIN WITH MINERALS) TABS tablet Take 1 tablet by mouth daily. 30 tablet   . Omega-3 Fatty Acids (FISH OIL PO) Take 1 capsule by mouth 2 (two) times daily.    . polyvinyl alcohol (LIQUIFILM TEARS) 1.4 % ophthalmic solution Place 1 drop into both eyes as needed for dry eyes.     . predniSONE (DELTASONE) 5 MG tablet Take 5 mg by mouth daily.    . vitamin C (ASCORBIC ACID) 500 MG tablet Take 500 mg by mouth daily as needed (for cold symptoms).     . zolpidem (AMBIEN) 10 MG tablet Take 10 mg by mouth at bedtime as needed for sleep.      No current facility-administered medications on file prior to visit.    Allergies:   Allergies  Allergen  Reactions  . Sulfonamide Derivatives Rash  . Sulfacetamide Sodium-Sulfur Rash      OBJECTIVE:  Physical Exam  Vitals:   05/21/20 1252  BP: 131/78  Pulse: 64  Weight: 137 lb (62.1 kg)  Height: 5\' 5"  (1.651 m)   Body mass index is 22.8 kg/m. No exam data present   Post stroke PHQ 2/9 Depression screen PHQ 2/9 05/01/2020  Decreased Interest 0  Down, Depressed, Hopeless 0  PHQ - 2 Score 0     General: well developed, well nourished,  pleasant elderly Caucasian female, seated, in no evident distress Head: head normocephalic and atraumatic.   Neck: supple with no carotid or supraclavicular bruits Cardiovascular: regular rate and rhythm, no murmurs Musculoskeletal: no deformity Skin:  no rash/petichiae Vascular:  Normal pulses all extremities   Neurologic Exam Mental Status: Awake and fully alert.   Unable to appreciate dysarthria or aphasia during visit.  Oriented to place and time. Recent and remote memory intact. Attention span, concentration and fund of knowledge appropriate. Mood and affect appropriate.  Cranial Nerves: Fundoscopic exam reveals sharp disc margins. Pupils equal, briskly reactive to light. Extraocular movements full without nystagmus. Visual fields full to confrontation. Hearing intact. Facial sensation intact. Face, tongue, palate moves normally and symmetrically.  Motor: Normal bulk and tone. Normal strength in all tested extremity muscles Sensory.: intact to touch , pinprick , position and vibratory sensation.  Coordination: Rapid alternating movements normal in all extremities. Finger-to-nose and heel-to-shin performed accurately bilaterally. Gait and Station: Arises from chair without difficulty. Stance is normal. Gait demonstrates normal stride length and balance with without use of assistive device.  Reflexes: 1+ and symmetric. Toes downgoing.     NIHSS  0 Modified Rankin  1      ASSESSMENT: CHARNELLE BERGEMAN is a 80 y.o. year old female  presented with slurred speech and left-sided weakness on 04/24/2020 with stroke work-up revealing acute right MCA infarct due to thrombus in right MCA bifurcation, s/p IR with TICI 2C revascularization likely secondary to PAF off Xarelto for colonoscopy. Vascular risk factors include atrial fibrillation, prior stroke history, HTN, HLD and advanced age.      PLAN:  1. R MCA stroke :  a. Residual deficit: Subjective slurred speech and mild cognitive impairment -initial evaluation with neuro rehab SLP scheduled 3/23.  b. Continue Eliquis (apixaban) daily  and atorvastatin for secondary stroke prevention.   c. Discussed secondary stroke prevention measures and importance of close PCP follow up for aggressive stroke risk factor management  2. Atrial fibrillation: CHA2DS2-VASc score of at least 6.  Previously on Xarelto -switched to Eliquis 5 mg twice daily.  Routine follow-up with cardiology 3. HTN: BP goal <130/90.  Stable on current regimen per PCP 4. HLD: LDL goal <70. Recent LDL 59 on atorvastatin 10 mg daily per PCP.     Follow up in 4 months or call earlier if needed   CC:  [facility] provider: Dr. Leonie Man PCP: Deland Pretty, MD    I spent 45 minutes of face-to-face and non-face-to-face time with patient and daughter.  This included previsit chart review including recent hospitalization pertinent progress notes, lab work and imaging, lab review, study review, order entry, electronic health record documentation, patient education regarding recent stroke including etiology, residual deficits, importance of managing stroke risk factors and answered all other questions to patient and daughters satisfaction   Frann Rider, AGNP-BC  [facility] [address] [place], [place] [address]  Phone [phone] Fax [phone] Note: This document was prepared with digital dictation and possible smart phrase technology. Any transcriptional errors that result from this  process are unintentional.

## 2020-05-21 NOTE — Patient Instructions (Signed)
Start speech therapy for residual speech and cognitive difficulties   Continue Eliquis (apixaban) daily  and atorvastatin  for secondary stroke prevention  Continue to follow up with PCP regarding cholesterol and blood pressure management  Maintain strict control of hypertension with blood pressure goal below 130/90 and cholesterol with LDL cholesterol (bad cholesterol) goal below 70 mg/dL.       Followup in the future with me in 4 months or call earlier if needed       Thank you for coming to see [place] at [facility]. I hope we have been able to provide you high quality care today.  You may receive a patient satisfaction survey over the next few weeks. We would appreciate your feedback and comments so that we may continue to improve ourselves and the health of our patients.

## 2020-05-28 DIAGNOSIS — Z8673 Personal history of transient ischemic attack (TIA), and cerebral infarction without residual deficits: Secondary | ICD-10-CM | POA: Diagnosis not present

## 2020-05-28 DIAGNOSIS — E7800 Pure hypercholesterolemia, unspecified: Secondary | ICD-10-CM | POA: Diagnosis not present

## 2020-05-28 DIAGNOSIS — Z01419 Encounter for gynecological examination (general) (routine) without abnormal findings: Secondary | ICD-10-CM | POA: Diagnosis not present

## 2020-05-29 ENCOUNTER — Inpatient Hospital Stay: Payer: Medicare Other | Attending: Adult Health | Admitting: Adult Health

## 2020-05-29 ENCOUNTER — Encounter: Payer: Self-pay | Admitting: Adult Health

## 2020-05-29 ENCOUNTER — Other Ambulatory Visit: Payer: Self-pay

## 2020-05-29 VITALS — BP 143/81 | HR 67 | Temp 98.7°F | Resp 18 | Ht 65.0 in | Wt 134.6 lb

## 2020-05-29 DIAGNOSIS — I4891 Unspecified atrial fibrillation: Secondary | ICD-10-CM | POA: Diagnosis not present

## 2020-05-29 DIAGNOSIS — M129 Arthropathy, unspecified: Secondary | ICD-10-CM | POA: Diagnosis not present

## 2020-05-29 DIAGNOSIS — K219 Gastro-esophageal reflux disease without esophagitis: Secondary | ICD-10-CM | POA: Diagnosis not present

## 2020-05-29 DIAGNOSIS — Z86000 Personal history of in-situ neoplasm of breast: Secondary | ICD-10-CM | POA: Diagnosis not present

## 2020-05-29 DIAGNOSIS — I639 Cerebral infarction, unspecified: Secondary | ICD-10-CM

## 2020-05-29 DIAGNOSIS — Z7901 Long term (current) use of anticoagulants: Secondary | ICD-10-CM | POA: Insufficient documentation

## 2020-05-29 DIAGNOSIS — Z9013 Acquired absence of bilateral breasts and nipples: Secondary | ICD-10-CM | POA: Insufficient documentation

## 2020-05-29 DIAGNOSIS — Z853 Personal history of malignant neoplasm of breast: Secondary | ICD-10-CM | POA: Diagnosis not present

## 2020-05-29 DIAGNOSIS — I341 Nonrheumatic mitral (valve) prolapse: Secondary | ICD-10-CM | POA: Diagnosis not present

## 2020-05-29 DIAGNOSIS — Z8673 Personal history of transient ischemic attack (TIA), and cerebral infarction without residual deficits: Secondary | ICD-10-CM | POA: Diagnosis not present

## 2020-05-29 DIAGNOSIS — Z79899 Other long term (current) drug therapy: Secondary | ICD-10-CM | POA: Insufficient documentation

## 2020-05-29 DIAGNOSIS — E785 Hyperlipidemia, unspecified: Secondary | ICD-10-CM | POA: Diagnosis not present

## 2020-05-29 DIAGNOSIS — M810 Age-related osteoporosis without current pathological fracture: Secondary | ICD-10-CM | POA: Insufficient documentation

## 2020-05-29 DIAGNOSIS — M353 Polymyalgia rheumatica: Secondary | ICD-10-CM | POA: Diagnosis not present

## 2020-05-29 NOTE — Patient Instructions (Signed)
Evusheld is the name of the injection that prevents against COVID 19.    Talk to Dr. Kathlene November about this, and I can set you up for it if you want to proceed.  [phone]

## 2020-05-29 NOTE — Progress Notes (Signed)
CLINIC:  Survivorship   REASON FOR VISIT:  Routine follow-up for history of breast cancer.   BRIEF ONCOLOGIC HISTORY:  DCIS diagnosed in 2011, s/p bilateral mastectomies   INTERVAL HISTORY:  Felicia Hunter presents to the [facility] today for routine follow-up for her history of breast cancer.  Overall, she reports feeling quite well.   Felicia is doing moderately well today.  She has no changes at her mastectomy sites.  She notes that she continues on low dose prednisone for her polymyalgia rheumatica.  She had a stroke last month and was admitted for a couple of days.  She has some mild residual speech slurring, but otherwise has recovered.  She has completed PT and OT therapy and is planning on going to speech therapy next week.    She continues to exercise in the water and is f/u with her medical team as recommended.       REVIEW OF SYSTEMS:  Review of Systems  Constitutional: Negative for appetite change, chills, fatigue, fever and unexpected weight change.  HENT:   Negative for hearing loss, lump/mass, sore throat and trouble swallowing.   Eyes: Negative for eye problems and icterus.  Respiratory: Negative for chest tightness, cough and shortness of breath.   Cardiovascular: Negative for chest pain, leg swelling and palpitations.  Gastrointestinal: Negative for abdominal distention, abdominal pain, constipation, diarrhea, nausea and vomiting.  Endocrine: Negative for hot flashes.  Genitourinary: Negative for difficulty urinating.   Musculoskeletal: Negative for arthralgias.  Skin: Negative for itching and rash.  Neurological: Negative for dizziness, extremity weakness, headaches and numbness.  Hematological: Negative for adenopathy. Does not bruise/bleed easily.  Psychiatric/Behavioral: Negative for depression. The patient is not nervous/anxious.    Breast: Denies any new nodularity, masses, tenderness, nipple changes, or nipple discharge.       PAST  MEDICAL/SURGICAL HISTORY:  Past Medical History:  Diagnosis Date  . Allergy   . Alopecia   . Arthritis   . Atrial fibrillation ([place])   . Breast cancer ([place])    double mastectomy 2011  . Clavicle fracture    right clavicle-hardware retained  . Clotting disorder ([place])    with CVA- 2013, 2014  . Cough    PERSISTANT COUGH X 20 YRS  . Difficulty sleeping    takes Ambien  . Disorders of bilirubin excretion   . Diverticulosis   . Environmental and seasonal allergies    "dry cough", related to ? allergies  . GERD (gastroesophageal reflux disease)   . Hyperlipidemia   . MVP (mitral valve prolapse)   . Neuromuscular disorder ([place])    Polypmyalgia  Pheumatica  . Osteopenia   . Osteoporosis   . Stroke ([place]) 06/07/11   x2 episodes-"funny feeling"" equilibrium off x24 hours, x1 speech affected- no residuallast 1 yr ago.  . Tubulovillous adenoma of colon 08/2003   Past Surgical History:  Procedure Laterality Date  . ABDOMINAL HYSTERECTOMY  1993  . BREAST CAPSULOTOMY     after bilat mastectomies-implants removed  . BREAST ENHANCEMENT SURGERY     x3-then removed 2011 for bilat mastectomies  . BREAST SURGERY  june 2011   double mastectomy nodes from rt  . CHEST WALL TUMOR EXCISION  5/12   noduals removed-neg  . collarbone  september 2011   pinned and plated, right  . COLONOSCOPY    . IR ANGIO EXTRACRAN SEL COM CAROTID INNOMINATE UNI R MOD SED  04/24/2020  . IR CT HEAD LTD  04/24/2020  . IR PERCUTANEOUS ART  THROMBECTOMY/INFUSION INTRACRANIAL INC DIAG ANGIO  04/24/2020  . JOINT REPLACEMENT  july 9th, 2012   left knee replacement , Right knee replacement 09-13-17 Dr. Alvan Dame  . KNEE RECONSTRUCTION  09/2010   revision lt total knee   left  . LOOP RECORDER IMPLANT N/A 11/07/2013   Procedure: LOOP RECORDER IMPLANT;  Surgeon: Evans Lance, MD;  Location: [facility];  Service: Cardiovascular;  Laterality: N/A;  . LOOP RECORDER REMOVAL N/A 05/16/2017   Procedure: LOOP RECORDER REMOVAL;  Surgeon:  Evans Lance, MD;  Location: [facility] CV LAB;  Service: Cardiovascular;  Laterality: N/A;  . MASS EXCISION Left 06/19/2012   Procedure: EXCISION OF LARGE MASS LEFT NECK/SHOULDER WITH PLASTIC CLOSURE;  Surgeon: Cristine Polio, MD;  Location: [facility];  Service: Plastics;  Laterality: Left;  . NASAL SEPTUM SURGERY    . RADIOLOGY WITH ANESTHESIA N/A 04/24/2020   Procedure: IR WITH ANESTHESIA;  Surgeon: Radiologist, Medication, MD;  Location: [facility];  Service: Radiology;  Laterality: N/A;  . TEE WITHOUT CARDIOVERSION N/A 11/07/2013   Procedure: TRANSESOPHAGEAL ECHOCARDIOGRAM (TEE);  Surgeon: Dorothy Spark, MD;  Location: [facility];  Service: Cardiovascular;  Laterality: N/A;  . TONSILLECTOMY    . TOTAL HIP ARTHROPLASTY Right 04/10/2013   Procedure: RIGHT TOTAL HIP ARTHROPLASTY ANTERIOR APPROACH;  Surgeon: [name], MD;  [facility];  Service: Orthopedics;  Laterality: Right;  . TOTAL HIP ARTHROPLASTY Left 12/31/2014   Procedure: LEFT TOTAL HIP ARTHROPLASTY ANTERIOR APPROACH;  Surgeon: Paralee Cancel, MD;  [facility];  Service: Orthopedics;  Laterality: Left;  . TOTAL KNEE ARTHROPLASTY Right 09/13/2017   Procedure: RIGHT TOTAL KNEE ARTHROPLASTY;  Surgeon: Paralee Cancel, MD;  [facility];  Service: Orthopedics;  Laterality: Right;  70 mins  . UPPER GASTROINTESTINAL ENDOSCOPY    . VEIN SURGERY  1974   left leg     ALLERGIES:  Allergies  Allergen Reactions  . Sulfonamide Derivatives Rash  . Sulfacetamide Sodium-Sulfur Rash     CURRENT MEDICATIONS:  Outpatient Encounter Medications as of 05/29/2020  Medication Sig  . acetaminophen (TYLENOL) 500 MG tablet Take 1,000 mg by mouth as needed for mild pain.  Marland Kitchen alendronate (FOSAMAX) 70 MG tablet Take 70 mg by mouth every Sunday.   Marland Kitchen apixaban (ELIQUIS) 5 MG TABS tablet Take 1 tablet (5 mg total) by mouth 2 (two) times daily.  Marland Kitchen atorvastatin (LIPITOR) 20 MG tablet 1 tablet  . BIOTIN PO Take 1 tablet by  mouth daily.  . Calcium Carb-Cholecalciferol (CALCIUM 600/VITAMIN D3 PO) Take 1 tablet by mouth daily.  . cholecalciferol (VITAMIN D) 1000 UNITS tablet Take 1,000 Units by mouth daily.  . Multiple Vitamin (MULTIVITAMIN WITH MINERALS) TABS tablet Take 1 tablet by mouth daily.  . Omega-3 Fatty Acids (FISH OIL PO) Take 1 capsule by mouth 2 (two) times daily.  . polyvinyl alcohol (LIQUIFILM TEARS) 1.4 % ophthalmic solution Place 1 drop into both eyes as needed for dry eyes.   . predniSONE (DELTASONE) 5 MG tablet Take 4 mg by mouth daily. Tapered down to 4 mg as 05/28/20  . vitamin C (ASCORBIC ACID) 500 MG tablet Take 500 mg by mouth daily as needed (for cold symptoms).   . zolpidem (AMBIEN) 10 MG tablet Take 10 mg by mouth at bedtime as needed for sleep.   . [DISCONTINUED] atorvastatin (LIPITOR) 10 MG tablet Take 10 mg by mouth daily.   No facility-administered encounter medications on file as of 05/29/2020.     ONCOLOGIC  FAMILY HISTORY:  Family History  Problem Relation Age of Onset  . Aneurysm Father   . Colon cancer Neg Hx   . Rectal cancer Neg Hx   . Stomach cancer Neg Hx   . Esophageal cancer Neg Hx     GENETIC COUNSELING/TESTING: Not at this time  SOCIAL HISTORY:  Social History   Socioeconomic History  . Marital status: Widowed    Spouse name: Not on file  . Number of children: 2  . Years of education: college  . Highest education level: Not on file  Occupational History  . Occupation: retired    Fish farm manager: Retired    Fish farm manager: RETIRED  Tobacco Use  . Smoking status: Never Smoker  . Smokeless tobacco: Never Used  Vaping Use  . Vaping Use: Never used  Substance and Sexual Activity  . Alcohol use: Yes    Alcohol/week: 1.0 standard drink    Types: 1 Glasses of wine per week    Comment: 1 glass of wine per night  . Drug use: No  . Sexual activity: Not Currently  Other Topics Concern  . Not on file  Social History Narrative   Patient is single with 2 children.     Patient is left handed.   Patient has college education.   Patient drinks coffee daily   Social Determinants of Health   Financial Resource Strain: Not on file  Food Insecurity: No Food Insecurity  . Worried About Charity fundraiser in the Last Year: Never true  . Ran Out of Food in the Last Year: Never true  Transportation Needs: No Transportation Needs  . Lack of Transportation (Medical): No  . Lack of Transportation (Non-Medical): No  Physical Activity: Not on file  Stress: Not on file  Social Connections: Not on file  Intimate Partner Violence: Not on file     PHYSICAL EXAMINATION:  Vital Signs: Vitals:   05/29/20 1040  BP: (!) 143/81  Pulse: 67  Resp: 18  Temp: 98.7 F (37.1 C)  SpO2: 100%   Filed Weights   05/29/20 1040  Weight: 134 lb 9.6 oz (61.1 kg)   General: Well-nourished, well-appearing female in no acute distress.  Unaccompanied today.   HEENT: Head is normocephalic.  Pupils equal and reactive to light. Conjunctivae clear without exudate.  Sclerae anicteric. Oral mucosa is pink, moist.  Oropharynx is pink without lesions or erythema.  Lymph: No cervical, supraclavicular, or infraclavicular lymphadenopathy noted on palpation.  Cardiovascular: Regular rate and rhythm.Marland Kitchen Respiratory: Clear to auscultation bilaterally. Chest expansion symmetric; breathing non-labored.  Breast Exam:  -s/p bilateral mastectomies.  No sign of local recurrence. -Axilla: No axillary adenopathy bilaterally.  GI: Abdomen soft and round; non-tender, non-distended. Bowel sounds normoactive. No hepatosplenomegaly.   GU: Deferred.  Neuro: No focal deficits. Steady gait.  Psych: Mood and affect normal and appropriate for situation.  MSK: No focal spinal tenderness to palpation, full range of motion in bilateral upper extremities Extremities: No edema. Skin: Warm and dry.  LABORATORY DATA:  None for this visit   DIAGNOSTIC IMAGING:  Most recent mammogram: Not applicable, s/p  bilateral mastectomies    ASSESSMENT AND PLAN:  Ms.. Hunter is a pleasant 80 y.o. female with history of Stage 0 right breast DCIS, diagnosed in 2011, treated with bilateral mastectomies She presents to the [facility] for surveillance and routine follow-up.   1. History of breast cancer:  Felicia Hunter is currently clinically and radiographically without evidence of disease or recurrence of breast cancer.  She will return in one year for LTS follow up. I encouraged her to call me with any questions or concerns before her next visit at the cancer center, and I would be happy to see her sooner, if needed.    2. Bone health:  Deferred to patient PCP.  She was given education on specific food and activities to promote bone health.  3. Cancer screening:  Due to Felicia Hunter history and her age, she should receive screening for skin cancers, colon cancer.   She was encouraged to follow-up with her PCP for appropriate cancer screenings.   4. Health maintenance and wellness promotion: Felicia Hunter was encouraged to consume 5-7 servings of fruits and vegetables per day. She was also encouraged to engage in moderate to vigorous exercise for 30 minutes per day most days of the week. She was instructed to limit her alcohol consumption and continue to abstain from tobacco use.  5.  Polymyalgia Rheumatica: She is on Chronic prednisone therapy.  We discussed the use of Evusheld as injection for COVID 19 infection.  I gave her information on this.  She sees Dr. Kathlene November in 2 weeks and will discuss with him, review information, and call me if she wants me to set this up for her.    Dispo:  -Return to cancer center in one year for LTS follow up   Total encounter time: 20 minutes*  Wilber Bihari, NP 05/29/20 10:57 AM Medical Oncology and Hematology [facility] [place], [place] [address] Tel. [phone]    Fax. [phone]    Note: PRIMARY CARE PROVIDER Deland Pretty,  [place] [phone]  *Total Encounter Time as defined by the Centers for Medicare and Medicaid Services includes, in addition to the face-to-face time of a patient visit (documented in the note above) non-face-to-face time: obtaining and reviewing outside history, ordering and reviewing medications, tests or procedures, care coordination (communications with other health care professionals or caregivers) and documentation in the medical record.

## 2020-06-01 NOTE — Progress Notes (Signed)
I agree with the above plan 

## 2020-06-04 ENCOUNTER — Other Ambulatory Visit: Payer: Self-pay

## 2020-06-04 ENCOUNTER — Ambulatory Visit: Payer: Medicare Other | Attending: Internal Medicine

## 2020-06-04 DIAGNOSIS — R482 Apraxia: Secondary | ICD-10-CM | POA: Insufficient documentation

## 2020-06-04 NOTE — Therapy (Signed)
[facility] [address] [place], [place], [address] Phone: [phone]   Fax:  [phone]  Speech Language Pathology Evaluation  Patient Details  Name: Felicia Hunter MRN: [id number] Date of Birth: 1940/12/19 Referring Provider (SLP): Deland Pretty, MD (PCP)-documentation; Rosalin Hawking, MD (referring)   Encounter Date: 06/04/2020   End of Session - 06/04/20 1655    Visit Number 1    Number of Visits 1    Date for SLP Re-Evaluation 06/04/20    SLP Start Time 1404    SLP Stop Time  8938    SLP Time Calculation (min) 41 min    Activity Tolerance Patient tolerated treatment well           Past Medical History:  Diagnosis Date  . Allergy   . Alopecia   . Arthritis   . Atrial fibrillation ([place])   . Breast cancer ([place])    double mastectomy 2011  . Clavicle fracture    right clavicle-hardware retained  . Clotting disorder ([place])    with CVA- 2013, 2014  . Cough    PERSISTANT COUGH X 20 YRS  . Difficulty sleeping    takes Ambien  . Disorders of bilirubin excretion   . Diverticulosis   . Environmental and seasonal allergies    "dry cough", related to ? allergies  . GERD (gastroesophageal reflux disease)   . Hyperlipidemia   . MVP (mitral valve prolapse)   . Neuromuscular disorder ([place])    Polypmyalgia  Pheumatica  . Osteopenia   . Osteoporosis   . Stroke ([place]) 06/07/11   x2 episodes-"funny feeling"" equilibrium off x24 hours, x1 speech affected- no residuallast 1 yr ago.  . Tubulovillous adenoma of colon 08/2003    Past Surgical History:  Procedure Laterality Date  . ABDOMINAL HYSTERECTOMY  1993  . BREAST CAPSULOTOMY     after bilat mastectomies-implants removed  . BREAST ENHANCEMENT SURGERY     x3-then removed 2011 for bilat mastectomies  . BREAST SURGERY  june 2011   double mastectomy nodes from rt  . CHEST WALL TUMOR EXCISION  5/12   noduals removed-neg  . collarbone  september 2011   pinned and  plated, right  . COLONOSCOPY    . IR ANGIO EXTRACRAN SEL COM CAROTID INNOMINATE UNI R MOD SED  04/24/2020  . IR CT HEAD LTD  04/24/2020  . IR PERCUTANEOUS ART THROMBECTOMY/INFUSION INTRACRANIAL INC DIAG ANGIO  04/24/2020  . JOINT REPLACEMENT  july 9th, 2012   left knee replacement , Right knee replacement 09-13-17 Dr. Alvan Dame  . KNEE RECONSTRUCTION  09/2010   revision lt total knee   left  . LOOP RECORDER IMPLANT N/A 11/07/2013   Procedure: LOOP RECORDER IMPLANT;  Surgeon: Evans Lance, MD;  Location[facility] CATH LAB;  Service: Cardiovascular;  Laterality: N/A;  . LOOP RECORDER REMOVAL N/A 05/16/2017   Procedure: LOOP RECORDER REMOVAL;  Surgeon: Evans Lance, MD;  Location: [facility];  Service: Cardiovascular;  Laterality: N/A;  . MASS EXCISION Left 06/19/2012   Procedure: EXCISION OF LARGE MASS LEFT NECK/SHOULDER WITH PLASTIC CLOSURE;  Surgeon: Cristine Polio, MD;  Location: [facility];  Service: Plastics;  Laterality: Left;  . NASAL SEPTUM SURGERY    . RADIOLOGY WITH ANESTHESIA N/A 04/24/2020   Procedure: IR WITH ANESTHESIA;  Surgeon: Radiologist, Medication, MD;  Location: [facility];  Service: Radiology;  Laterality: N/A;  . TEE WITHOUT CARDIOVERSION N/A 11/07/2013   Procedure: TRANSESOPHAGEAL ECHOCARDIOGRAM (TEE);  Surgeon:  Dorothy Spark, MD;  Location: [facility];  Service: Cardiovascular;  Laterality: N/A;  . TONSILLECTOMY    . TOTAL HIP ARTHROPLASTY Right 04/10/2013   Procedure: RIGHT TOTAL HIP ARTHROPLASTY ANTERIOR APPROACH;  Surgeon: [name], MD;  [facility];  Service: Orthopedics;  Laterality: Right;  . TOTAL HIP ARTHROPLASTY Left 12/31/2014   Procedure: LEFT TOTAL HIP ARTHROPLASTY ANTERIOR APPROACH;  Surgeon: Paralee Cancel, MD;  [facility];  Service: Orthopedics;  Laterality: Left;  . TOTAL KNEE ARTHROPLASTY Right 09/13/2017   Procedure: RIGHT TOTAL KNEE ARTHROPLASTY;  Surgeon: Paralee Cancel, MD;  [facility];  Service: Orthopedics;  Laterality:  Right;  70 mins  . UPPER GASTROINTESTINAL ENDOSCOPY    . VEIN SURGERY  1974   left leg    There were no vitals filed for this visit.   Subjective Assessment - 06/04/20 1357    Subjective "It was anout - see there it is - ABOUT."    Patient is accompained by: Family member   Rip Harbour - dtr             SLP Evaluation [place] - 06/04/20 1357      Pain Assessment   Currently in Pain? --                           SLP Education - 06/04/20 1654    Education Details eval results do not dictate pt is appropriate for skilled ST at this time due to minimal nature of deficit    Person(s) Educated Patient;Child(ren)    Methods Explanation    Comprehension Verbalized understanding                Plan - 06/04/20 1656    Clinical Impression Statement Informally, pt was assessed with interview format - speech clarity was 100% without notable dysarthria. SLP noted x2 errors in pt's articuation, likely very mild verbal apraxia ("adoubt"/about, "acti-tivity"/activity). SLP suspects this will disspiate in the next 2-4 weeks. Pt and daughter assured SLP pt was independent with cognitive tasks such as bill paying, medication administration/organization, and with appointment management -these skills have improved and pt now without any change at this time from pre-CVA. SLP believes based on this information that pt is WFL/WNL with her cognitive linguistic skills at this time and therefore does not require skilled ST services. ST was offered should pt desire in the future - likely focusing on compensations for mild verbal apraxia.    Speech Therapy Frequency One time visit    SLP Home Exercise Plan sLP told pt she could decr her rate of speech to decr her frequency of verbal apraxia errors    Consulted and Agree with Plan of Care Patient           Patient will benefit from skilled therapeutic intervention in order to improve the following deficits and impairments:   Verbal  apraxia    Problem List Patient Active Problem List   Diagnosis Date Noted  . Allergy to sulfa drugs 05/01/2020  . Alopecia 05/01/2020  . Asymptomatic varicose veins of unspecified lower extremity 05/01/2020  . Cramp and spasm 05/01/2020  . Diverticular disease of colon 05/01/2020  . Electrocardiogram abnormal 05/01/2020  . Encounter for general adult medical examination without abnormal findings 05/01/2020  . Esophageal reflux 05/01/2020  . Gilbert's syndrome 05/01/2020  . Hardening of the aorta (main artery of the heart) ([place]) 05/01/2020  . History of adenomatous polyp of colon 05/01/2020  .  Osteoarthritis 05/01/2020  . Long term (current) use of anticoagulants 05/01/2020  . Long term (current) use of systemic steroids 05/01/2020  . Mitral valve prolapse 05/01/2020  . Osteoporosis 05/01/2020  . Pain due to varicose veins of lower extremity 05/01/2020  . Paroxysmal atrial fibrillation ([place]) 05/01/2020  . Personal history of (healed) traumatic fracture 05/01/2020  . Polymyalgia rheumatica ([place]) 05/01/2020  . S/P mastectomy, bilateral 05/01/2020  . Thrombophilia ([place]) 05/01/2020  . Vitiligo 05/01/2020  . Stroke (cerebrum) ([place]) 04/24/2020  . Middle cerebral artery embolism, right 04/24/2020  . Chronic cough 08/08/2018  . Solitary pulmonary nodule 03/30/2018  . S/P right TKA 09/13/2017  . S/P total knee replacement 09/13/2017  . Right leg pain 03/01/2016  . S/P left THA, AA 12/31/2014  . Cerebral infarction due to embolism of precerebral artery ([place]) 12/20/2013  . Personal history of transient ischemic attack (TIA), and cerebral infarction without residual deficits 12/20/2013  . Expressive aphasia 11/05/2013  . Expected blood loss anemia 04/11/2013  . Hyponatremia 04/11/2013  . Hypercholesterolemia 06/15/2012  . Lacunar infarction ([place]) 06/15/2012  . CVA (cerebral infarction)-mid/inf cerebellar infarct 3/25 06/08/2011  . Near syncope 06/07/2011  . Personal history of  malignant neoplasm of breast 02/24/2011  . DEPRESSION 10/28/2006  . ALLERGIC RHINITIS 10/28/2006  . ASTHMA 10/28/2006    [facility] ,[place], [place]  06/04/2020, 5:01 PM  [facility] [address] [place], [place], [address] Phone: [phone]   Fax:  [phone]  Name: Felicia Hunter MRN: [id number] Date of Birth: 01-Jun-1940

## 2020-06-17 DIAGNOSIS — M2550 Pain in unspecified joint: Secondary | ICD-10-CM | POA: Diagnosis not present

## 2020-06-17 DIAGNOSIS — Z7952 Long term (current) use of systemic steroids: Secondary | ICD-10-CM | POA: Diagnosis not present

## 2020-06-17 DIAGNOSIS — I700 Atherosclerosis of aorta: Secondary | ICD-10-CM | POA: Diagnosis not present

## 2020-06-17 DIAGNOSIS — M810 Age-related osteoporosis without current pathological fracture: Secondary | ICD-10-CM | POA: Diagnosis not present

## 2020-06-17 DIAGNOSIS — M549 Dorsalgia, unspecified: Secondary | ICD-10-CM | POA: Diagnosis not present

## 2020-06-17 DIAGNOSIS — M1990 Unspecified osteoarthritis, unspecified site: Secondary | ICD-10-CM | POA: Diagnosis not present

## 2020-06-17 DIAGNOSIS — E785 Hyperlipidemia, unspecified: Secondary | ICD-10-CM | POA: Diagnosis not present

## 2020-06-17 DIAGNOSIS — S92812A Other fracture of left foot, initial encounter for closed fracture: Secondary | ICD-10-CM | POA: Diagnosis not present

## 2020-06-17 DIAGNOSIS — M791 Myalgia, unspecified site: Secondary | ICD-10-CM | POA: Diagnosis not present

## 2020-06-17 DIAGNOSIS — Z8673 Personal history of transient ischemic attack (TIA), and cerebral infarction without residual deficits: Secondary | ICD-10-CM | POA: Diagnosis not present

## 2020-06-17 DIAGNOSIS — M353 Polymyalgia rheumatica: Secondary | ICD-10-CM | POA: Diagnosis not present

## 2020-06-17 DIAGNOSIS — R700 Elevated erythrocyte sedimentation rate: Secondary | ICD-10-CM | POA: Diagnosis not present

## 2020-07-15 DIAGNOSIS — Z23 Encounter for immunization: Secondary | ICD-10-CM | POA: Diagnosis not present

## 2020-07-22 ENCOUNTER — Other Ambulatory Visit: Payer: Self-pay

## 2020-07-22 NOTE — Patient Outreach (Signed)
[facility]) Care Management  07/22/2020  Felicia Hunter 02-04-1941 [id number]   Telephone outreach to patient to obtain mRS was successfully completed. MRS= 1  [place] Care Management Assistant

## 2020-08-19 DIAGNOSIS — M353 Polymyalgia rheumatica: Secondary | ICD-10-CM | POA: Diagnosis not present

## 2020-08-19 DIAGNOSIS — Z7952 Long term (current) use of systemic steroids: Secondary | ICD-10-CM | POA: Diagnosis not present

## 2020-08-19 DIAGNOSIS — Z0289 Encounter for other administrative examinations: Secondary | ICD-10-CM | POA: Diagnosis not present

## 2020-08-19 DIAGNOSIS — M1990 Unspecified osteoarthritis, unspecified site: Secondary | ICD-10-CM | POA: Diagnosis not present

## 2020-08-19 DIAGNOSIS — I480 Paroxysmal atrial fibrillation: Secondary | ICD-10-CM | POA: Diagnosis not present

## 2020-08-19 DIAGNOSIS — Z7901 Long term (current) use of anticoagulants: Secondary | ICD-10-CM | POA: Diagnosis not present

## 2020-08-19 DIAGNOSIS — Z853 Personal history of malignant neoplasm of breast: Secondary | ICD-10-CM | POA: Diagnosis not present

## 2020-08-19 DIAGNOSIS — E7800 Pure hypercholesterolemia, unspecified: Secondary | ICD-10-CM | POA: Diagnosis not present

## 2020-08-19 DIAGNOSIS — Z8673 Personal history of transient ischemic attack (TIA), and cerebral infarction without residual deficits: Secondary | ICD-10-CM | POA: Diagnosis not present

## 2020-09-16 DIAGNOSIS — M549 Dorsalgia, unspecified: Secondary | ICD-10-CM | POA: Diagnosis not present

## 2020-09-16 DIAGNOSIS — M810 Age-related osteoporosis without current pathological fracture: Secondary | ICD-10-CM | POA: Diagnosis not present

## 2020-09-16 DIAGNOSIS — M1990 Unspecified osteoarthritis, unspecified site: Secondary | ICD-10-CM | POA: Diagnosis not present

## 2020-09-16 DIAGNOSIS — M2550 Pain in unspecified joint: Secondary | ICD-10-CM | POA: Diagnosis not present

## 2020-09-16 DIAGNOSIS — R700 Elevated erythrocyte sedimentation rate: Secondary | ICD-10-CM | POA: Diagnosis not present

## 2020-09-16 DIAGNOSIS — Z7952 Long term (current) use of systemic steroids: Secondary | ICD-10-CM | POA: Diagnosis not present

## 2020-09-16 DIAGNOSIS — M353 Polymyalgia rheumatica: Secondary | ICD-10-CM | POA: Diagnosis not present

## 2020-09-16 DIAGNOSIS — M791 Myalgia, unspecified site: Secondary | ICD-10-CM | POA: Diagnosis not present

## 2020-09-25 ENCOUNTER — Encounter: Payer: Self-pay | Admitting: Adult Health

## 2020-09-25 ENCOUNTER — Other Ambulatory Visit: Payer: Self-pay

## 2020-09-25 ENCOUNTER — Ambulatory Visit (INDEPENDENT_AMBULATORY_CARE_PROVIDER_SITE_OTHER): Payer: Medicare Other | Admitting: Adult Health

## 2020-09-25 VITALS — BP 132/78 | HR 80 | Ht 65.0 in | Wt 131.2 lb

## 2020-09-25 DIAGNOSIS — I480 Paroxysmal atrial fibrillation: Secondary | ICD-10-CM

## 2020-09-25 DIAGNOSIS — E785 Hyperlipidemia, unspecified: Secondary | ICD-10-CM

## 2020-09-25 DIAGNOSIS — I10 Essential (primary) hypertension: Secondary | ICD-10-CM | POA: Diagnosis not present

## 2020-09-25 DIAGNOSIS — I639 Cerebral infarction, unspecified: Secondary | ICD-10-CM

## 2020-09-25 NOTE — Patient Instructions (Signed)
Continue Eliquis (apixaban) daily  and atorvastatin  for secondary stroke prevention  Continue to follow up with PCP regarding cholesterol and blood pressure management  Maintain strict control of hypertension with blood pressure goal below 130/90 and cholesterol with LDL cholesterol (bad cholesterol) goal below 70 mg/dL.       Followup in the future with me in 6 months or call earlier if needed       Thank you for coming to see [place] at [facility]. I hope we have been able to provide you high quality care today.  You may receive a patient satisfaction survey over the next few weeks. We would appreciate your feedback and comments so that we may continue to improve ourselves and the health of our patients.

## 2020-09-25 NOTE — Progress Notes (Signed)
I agree with the above plan 

## 2020-09-25 NOTE — Progress Notes (Signed)
[facility] [address] [place]. [place] [address] [phone]       STROKE FOLLOW UP NOTE  [name] Date of Birth:  [birth year] Medical Record Number:  [id number]   Reason for Referral: stroke follow up    SUBJECTIVE:   CHIEF COMPLAINT:  Chief Complaint  Patient presents with   Ischemic stroke    Rm 3, 4 month FU  "doing well"      HPI:   Today, 09/25/2020, Felicia Hunter returns for 63-month stroke follow-up.  She has been doing well since prior visit without new stroke/TIA symptoms. Reports improvement of cognition currently at baseline and occasional.  She continues to live alone and maintains ADLs and IADLs independently without difficulty.  Reports compliance on Eliquis and atorvastatin without associated side effects.  Blood pressure today 132/78.  No new concerns at this time.   History provided for reference purposes only Initial visit 05/21/2020 JM: Felicia Hunter is being seen for hospital follow-up accompanied by her daughter Rip Harbour.  Reports residual mild slurred speech but gradually improving -typically only occurs towards the end of the day, increased fatigue, heightened emotions or when trying to speak too quickly Per daughter, some difficulty with focusing or paying attention which has been slightly improving Released by PT/OT -initial evaluation with SLP scheduled 3/23 Lives alone and maintains ADLs and IADLs independently - she keeps very active in her community Denies new stroke/TIA symptoms  Compliant on Eliquis 5 mg twice daily -denies side effects Complaint on atorvastatin 10mg  daily - denies side effects Blood pressure today 131/78   Recent lipid panel showed LDL 75 (04/2020)  No further concerns at this time  Stroke admission 04/24/2020 Felicia Hunter is a 80 y.o. female with history of with A- fib on Xarelto (off since 04/22/20 for colonoscopy, Last dose on 04/21/20) , hyperlipidemia, hypertension, MVP, CVA x2 (2013, 2014) no  baseline neurological deficits, breast cancer s/p bilateral mastectomy 2011, polymyalgia rheumatica, GERD, colon polyps, s/p hysterectomy, hip and total knee replacement who presented to [facility] on 04/24/2020 after acute onset of slurred speech and left sided weakness.  Personally reviewed hospitalization pertinent progress notes, lab work and imaging with summary provided.  Evaluated by Dr. Erlinda Hong with stroke work-up revealing acute right MCA infarct in setting of thrombus and right MCA bifurcation s/p IR with TICI 2C revascularization, likely secondary to PAF off Xarelto for procedure.  Recommended initiating Eliquis for history of A. fib and stroke prevention.  History of prior left MCA stroke in 2015 s/p ILR with evidence of A. fib in 2018 and placed on Xarelto.  LDL 59.  A1c 5.8.  Other stroke risk factors include advanced age and prior strokes.   Stroke - Acute right MCA infarct due to thrombus in the right MCA bifurcation, S/P IR with TICI2C revascularization - Etilogy likely due to PAF off Xeralto  CT Code Stroke No acute intracranial hemorrhage. Acute infarction involving right temporal lobe and posterior insula. ASPECT score is 8. CTA Head and Neck- Thrombus at the right MCA bifurcation occluding one branch origin and partially occluding the other.  MRI (04/25/20)- Relatively small acute cortical infarct in the posterolateral right temporal lobe, and scattered additional punctate infarcts in the right lentiform, perirolandic cortex. No associated hemorrhage or mass effect.  MRA- Intracranial MRA is negative for large vessel occlusion.  2D Echo-  LVEF of 65 to 70%, right atrial size mildly dilated, trace MVR. LDL 59 HgbA1c 5.8 VTE prophylaxis - SCDs Xarelto (rivaroxaban)  daily prior to admission (off 3 days for colonoscopy), now on aspirin 325 mg daily. Consider to start eliquis tomorrow afternoon   Therapy recommendations:  outpt PT/OT/SLP Disposition:  home   ROS:   14 system review of systems  performed and negative with exception of those listed in HPI  PMH:  Past Medical History:  Diagnosis Date   Allergy    Alopecia    Arthritis    Atrial fibrillation ([place])    Breast cancer ([place])    double mastectomy 2011   Clavicle fracture    right clavicle-hardware retained   Clotting disorder ([place])    with CVA- 2013, 2014   Cough    PERSISTANT COUGH X 20 YRS   Difficulty sleeping    takes Ambien   Disorders of bilirubin excretion    Diverticulosis    Environmental and seasonal allergies    "dry cough", related to ? allergies   GERD (gastroesophageal reflux disease)    Hyperlipidemia    MVP (mitral valve prolapse)    Neuromuscular disorder ([place])    Polypmyalgia  Pheumatica   Osteopenia    Osteoporosis    Stroke ([place]) 06/07/11   x2 episodes-"funny feeling"" equilibrium off x24 hours, x1 speech affected- no residuallast 1 yr ago.   Tubulovillous adenoma of colon 08/2003    PSH:  Past Surgical History:  Procedure Laterality Date   ABDOMINAL HYSTERECTOMY  1993   BREAST CAPSULOTOMY     after bilat mastectomies-implants removed   BREAST ENHANCEMENT SURGERY     x3-then removed 2011 for bilat mastectomies   BREAST SURGERY  june 2011   double mastectomy nodes from rt   [place]  5/12   noduals removed-neg   collarbone  september 2011   pinned and plated, right   COLONOSCOPY     IR ANGIO EXTRACRAN SEL COM CAROTID INNOMINATE UNI R MOD SED  04/24/2020   IR CT HEAD LTD  04/24/2020   IR PERCUTANEOUS ART THROMBECTOMY/INFUSION INTRACRANIAL INC DIAG ANGIO  04/24/2020   JOINT REPLACEMENT  july 9th, 2012   left knee replacement , Right knee replacement 09-13-17 Dr. Alvan Dame   KNEE RECONSTRUCTION  09/2010   revision lt total knee   left   LOOP RECORDER IMPLANT N/A 11/07/2013   Procedure: LOOP RECORDER IMPLANT;  Surgeon: Evans Lance, MD;  Location: [facility] CATH LAB;  Service: Cardiovascular;  Laterality: N/A;   LOOP RECORDER REMOVAL N/A 05/16/2017   Procedure: LOOP RECORDER  REMOVAL;  Surgeon: Evans Lance, MD;  Location: [place] CV LAB;  Service: Cardiovascular;  Laterality: N/A;   MASS EXCISION Left 06/19/2012   Procedure: EXCISION OF LARGE MASS LEFT NECK/SHOULDER WITH PLASTIC CLOSURE;  Surgeon: Cristine Polio, MD;  Location: [place];  Service: Plastics;  Laterality: Left;   NASAL SEPTUM SURGERY     RADIOLOGY WITH ANESTHESIA N/A 04/24/2020   Procedure: IR WITH ANESTHESIA;  Surgeon: Radiologist, Medication, MD;  Location: [place];  Service: Radiology;  Laterality: N/A;   TEE WITHOUT CARDIOVERSION N/A 11/07/2013   Procedure: TRANSESOPHAGEAL ECHOCARDIOGRAM (TEE);  Surgeon: Dorothy Spark, MD;  Location: [facility];  Service: Cardiovascular;  Laterality: N/A;   TONSILLECTOMY     TOTAL HIP ARTHROPLASTY Right 04/10/2013   Procedure: RIGHT TOTAL HIP ARTHROPLASTY ANTERIOR APPROACH;  Surgeon: [name], MD;  Location: [facility];  Service: Orthopedics;  Laterality: Right;   TOTAL HIP ARTHROPLASTY Left 12/31/2014   Procedure: LEFT TOTAL HIP ARTHROPLASTY ANTERIOR APPROACH;  Surgeon: Paralee Cancel,  MD;  Location: [facility];  Service: Orthopedics;  Laterality: Left;   TOTAL KNEE ARTHROPLASTY Right 09/13/2017   Procedure: RIGHT TOTAL KNEE ARTHROPLASTY;  Surgeon: Paralee Cancel, MD;  Location: [facility];  Service: Orthopedics;  Laterality: Right;  70 mins   UPPER GASTROINTESTINAL ENDOSCOPY     VEIN SURGERY  1974   left leg    Social History:  Social History   Socioeconomic History   Marital status: Widowed    Spouse name: Not on file   Number of children: 2   Years of education: college   Highest education level: Not on file  Occupational History   Occupation: retired    Fish farm manager: Retired    Fish farm manager: RETIRED  Tobacco Use   Smoking status: Never   Smokeless tobacco: Never  Vaping Use   Vaping Use: Never used  Substance and Sexual Activity   Alcohol use: Yes    Alcohol/week: 1.0 standard drink    Types: 1 Glasses of wine per week    Comment: 1  glass of wine per night   Drug use: No   Sexual activity: Not Currently  Other Topics Concern   Not on file  Social History Narrative   Patient is single with 2 children.    Patient is left handed.   Patient has college education.   Patient drinks coffee daily   Social Determinants of Health   Financial Resource Strain: Not on file  Food Insecurity: No Food Insecurity   Worried About Charity fundraiser in the Last Year: Never true   Ran Out of Food in the Last Year: Never true  Transportation Needs: No Transportation Needs   Lack of Transportation (Medical): No   Lack of Transportation (Non-Medical): No  Physical Activity: Not on file  Stress: Not on file  Social Connections: Not on file  Intimate Partner Violence: Not on file    Family History:  Family History  Problem Relation Age of Onset   Aneurysm Father    Colon cancer Neg Hx    Rectal cancer Neg Hx    Stomach cancer Neg Hx    Esophageal cancer Neg Hx     Medications:   Current Outpatient Medications on File Prior to Visit  Medication Sig Dispense Refill   acetaminophen (TYLENOL) 500 MG tablet Take 1,000 mg by mouth as needed for mild pain.     alendronate (FOSAMAX) 70 MG tablet Take 70 mg by mouth every Sunday.      apixaban (ELIQUIS) 5 MG TABS tablet Take 1 tablet (5 mg total) by mouth 2 (two) times daily. 60 tablet 2   atorvastatin (LIPITOR) 20 MG tablet 1 tablet     BIOTIN PO Take 1 tablet by mouth daily.     Calcium Carb-Cholecalciferol (CALCIUM 600/VITAMIN D3 PO) Take 1 tablet by mouth daily.     cholecalciferol (VITAMIN D) 1000 UNITS tablet Take 1,000 Units by mouth daily.     Multiple Vitamin (MULTIVITAMIN WITH MINERALS) TABS tablet Take 1 tablet by mouth daily. 30 tablet    Omega-3 Fatty Acids (FISH OIL PO) Take 1 capsule by mouth 2 (two) times daily.     polyvinyl alcohol (LIQUIFILM TEARS) 1.4 % ophthalmic solution Place 1 drop into both eyes as needed for dry eyes.      predniSONE (DELTASONE) 5 MG  tablet Take 4 mg by mouth daily. Tapered down to 4 mg as 05/28/20     vitamin C (ASCORBIC ACID) 500 MG tablet Take 500 mg by  mouth daily as needed (for cold symptoms).      zolpidem (AMBIEN) 10 MG tablet Take 10 mg by mouth at bedtime as needed for sleep.      No current facility-administered medications on file prior to visit.    Allergies:   Allergies  Allergen Reactions   Sulfonamide Derivatives Rash   Sulfacetamide Sodium-Sulfur Rash      OBJECTIVE:  Physical Exam  Vitals:   09/25/20 1321  BP: 132/78  Pulse: 80  Weight: 131 lb 3.2 oz (59.5 kg)  Height: 5\' 5"  (1.651 m)    Body mass index is 21.83 kg/m. No results found.   General: well developed, well nourished,  pleasant elderly Caucasian female, seated, in no evident distress Head: head normocephalic and atraumatic.   Neck: supple with no carotid or supraclavicular bruits Cardiovascular: regular rate and rhythm, no murmurs Musculoskeletal: no deformity Skin:  no rash/petichiae Vascular:  Normal pulses all extremities   Neurologic Exam Mental Status: Awake and fully alert.   Unable to appreciate dysarthria or aphasia during visit.  Oriented to place and time. Recent and remote memory intact. Attention span, concentration and fund of knowledge appropriate. Mood and affect appropriate.  Cranial Nerves: Pupils equal, briskly reactive to light. Extraocular movements full without nystagmus. Visual fields full to confrontation. Hearing intact. Facial sensation intact. Face, tongue, palate moves normally and symmetrically.  Motor: Normal bulk and tone. Normal strength in all tested extremity muscles Sensory.: intact to touch , pinprick , position and vibratory sensation.  Coordination: Rapid alternating movements normal in all extremities. Finger-to-nose and heel-to-shin performed accurately bilaterally. Gait and Station: Arises from chair without difficulty. Stance is normal. Gait demonstrates normal stride length and  balance with without use of assistive device.  Reflexes: 1+ and symmetric. Toes downgoing.       ASSESSMENT: Felicia Hunter is a 80 y.o. year old female presented with slurred speech and left-sided weakness on 04/24/2020 with stroke work-up revealing acute right MCA infarct due to thrombus in right MCA bifurcation, s/p IR with TICI 2C revascularization likely secondary to PAF off Xarelto for colonoscopy. Vascular risk factors include atrial fibrillation, prior stroke history, HTN, HLD and advanced age.      PLAN:  R MCA stroke :  Recovered well with only mild residual deficit of occasional dysarthria Continue Eliquis (apixaban) daily  and atorvastatin for secondary stroke prevention.   Discussed secondary stroke prevention measures and importance of close PCP follow up for aggressive stroke risk factor management  Atrial fibrillation: On Eliquis 5 mg twice daily for CHA2DS2-VASc score of at least 6.  Routine follow-up with cardiology - she plans on calling to schedule f/u visit HTN: BP goal <130/90.  Stable on current regimen per PCP HLD: LDL goal <70. Prior LDL 59 on atorvastatin 10 mg daily per PCP - reports routine lab work by PCP which has been satisfactory per pt (unable to personally view via epic)    Follow up in 6 months or call earlier if needed   CC:  [place] provider: Dr. Leonie Man PCP: Deland Pretty, MD    I spent 29 minutes of face-to-face and non-face-to-face time with patient.  This included previsit chart review, lab review, study review, electronic health record documentation, patient education regarding prior stroke including etiology, secondary stroke prevention measures and importance of managing stroke risk factors, minimal residual deficits and answered all other questions to patients satisfaction   Frann Rider, AGNP-BC  [facility] [address] [place], [place]  [address]  Phone [phone] Fax [phone] Note: This document  was prepared with digital dictation and possible smart phrase technology. Any transcriptional errors that result from this process are unintentional.

## 2020-10-13 DIAGNOSIS — H524 Presbyopia: Secondary | ICD-10-CM | POA: Diagnosis not present

## 2020-10-13 DIAGNOSIS — H2513 Age-related nuclear cataract, bilateral: Secondary | ICD-10-CM | POA: Diagnosis not present

## 2020-11-12 DIAGNOSIS — K219 Gastro-esophageal reflux disease without esophagitis: Secondary | ICD-10-CM | POA: Diagnosis not present

## 2020-11-12 DIAGNOSIS — E7800 Pure hypercholesterolemia, unspecified: Secondary | ICD-10-CM | POA: Diagnosis not present

## 2020-11-12 DIAGNOSIS — M810 Age-related osteoporosis without current pathological fracture: Secondary | ICD-10-CM | POA: Diagnosis not present

## 2020-11-12 DIAGNOSIS — M1990 Unspecified osteoarthritis, unspecified site: Secondary | ICD-10-CM | POA: Diagnosis not present

## 2020-12-03 DIAGNOSIS — Z23 Encounter for immunization: Secondary | ICD-10-CM | POA: Diagnosis not present

## 2021-01-21 DIAGNOSIS — R700 Elevated erythrocyte sedimentation rate: Secondary | ICD-10-CM | POA: Diagnosis not present

## 2021-01-21 DIAGNOSIS — M2550 Pain in unspecified joint: Secondary | ICD-10-CM | POA: Diagnosis not present

## 2021-01-21 DIAGNOSIS — Z7952 Long term (current) use of systemic steroids: Secondary | ICD-10-CM | POA: Diagnosis not present

## 2021-01-21 DIAGNOSIS — M79671 Pain in right foot: Secondary | ICD-10-CM | POA: Diagnosis not present

## 2021-01-21 DIAGNOSIS — M791 Myalgia, unspecified site: Secondary | ICD-10-CM | POA: Diagnosis not present

## 2021-01-21 DIAGNOSIS — M25511 Pain in right shoulder: Secondary | ICD-10-CM | POA: Diagnosis not present

## 2021-01-21 DIAGNOSIS — M353 Polymyalgia rheumatica: Secondary | ICD-10-CM | POA: Diagnosis not present

## 2021-01-21 DIAGNOSIS — M810 Age-related osteoporosis without current pathological fracture: Secondary | ICD-10-CM | POA: Diagnosis not present

## 2021-01-21 DIAGNOSIS — M1990 Unspecified osteoarthritis, unspecified site: Secondary | ICD-10-CM | POA: Diagnosis not present

## 2021-01-21 DIAGNOSIS — M549 Dorsalgia, unspecified: Secondary | ICD-10-CM | POA: Diagnosis not present

## 2021-01-21 DIAGNOSIS — M79672 Pain in left foot: Secondary | ICD-10-CM | POA: Diagnosis not present

## 2021-03-20 DIAGNOSIS — E7801 Familial hypercholesterolemia: Secondary | ICD-10-CM | POA: Diagnosis not present

## 2021-03-20 DIAGNOSIS — M810 Age-related osteoporosis without current pathological fracture: Secondary | ICD-10-CM | POA: Diagnosis not present

## 2021-03-20 DIAGNOSIS — Z7982 Long term (current) use of aspirin: Secondary | ICD-10-CM | POA: Diagnosis not present

## 2021-03-24 DIAGNOSIS — M353 Polymyalgia rheumatica: Secondary | ICD-10-CM | POA: Diagnosis not present

## 2021-03-24 DIAGNOSIS — D6869 Other thrombophilia: Secondary | ICD-10-CM | POA: Diagnosis not present

## 2021-03-24 DIAGNOSIS — I83813 Varicose veins of bilateral lower extremities with pain: Secondary | ICD-10-CM | POA: Diagnosis not present

## 2021-03-24 DIAGNOSIS — I480 Paroxysmal atrial fibrillation: Secondary | ICD-10-CM | POA: Diagnosis not present

## 2021-03-24 DIAGNOSIS — Z8673 Personal history of transient ischemic attack (TIA), and cerebral infarction without residual deficits: Secondary | ICD-10-CM | POA: Diagnosis not present

## 2021-03-24 DIAGNOSIS — M810 Age-related osteoporosis without current pathological fracture: Secondary | ICD-10-CM | POA: Diagnosis not present

## 2021-03-24 DIAGNOSIS — Z0000 Encounter for general adult medical examination without abnormal findings: Secondary | ICD-10-CM | POA: Diagnosis not present

## 2021-03-24 DIAGNOSIS — I700 Atherosclerosis of aorta: Secondary | ICD-10-CM | POA: Diagnosis not present

## 2021-03-31 NOTE — Progress Notes (Signed)
° °  I, Peterson Lombard, LAT, ATC acting as a scribe for Lynne Leader, MD.  Subjective:    CC: R shoulder pain  HPI: Pt is an 81 y/o female c/o R shoulder pain x years. Pt notes a hx of fx clavicle, PMR, and RC tear. Pt locates pain to the anterior aspect of the R shoulder. Pt is limited in AROM, esp shoulder abd and flexion. Pt enjoys doing deep water aerobics. Pt c/o pain causing her trouble sleeping  Neck pain: no Radiates: yes- but may be more related to the PMR Mechanical symptoms: no UE Numbness/tingling: yes UE Weakness: yes Aggravates: shoulder ADB, flex Treatments tried: Tylenol  Dx imaging: 02/24/18 C-spine CT  Pertinent review of Systems: No fevers or chills  Relevant historical information: Polymyalgia rheumatica currently managed with 3 mg prednisone daily.  Osteoporosis.   Objective:    Vitals:   04/01/21 1245  BP: 128/80  Pulse: 71  SpO2: 97%   General: Well Developed, well nourished, and in no acute distress.   MSK: Right shoulder decreased range of motion to abduction external and internal rotation.  Nontender to palpation.  Lab and Radiology Results  Patient provided images of right shoulder on CD from November 2022 showing severe glenohumeral DJD with osteopenia changes and intact surgical fixation plate clavicle.     Impression and Recommendations:    Assessment and Plan: 81 y.o. female with chronic right shoulder pain.  Based on her x-ray images she has severe glenohumeral DJD is the main source of pain.  Unfortunately her situation is challenging.  Ultimately to fully treat this pain she is going to need a shoulder replacement but her poor bone density and poor bone health makes a shoulder replacement challenging and maybe unwise at this time. I am doubtful that physical therapy would help very much given the severity of her DJD and lack of range of motion. We discussed conservative management strategies.  I offered a steroid injection but she  stated that she thinks it is going to be temporary and would like to avoid it.  I think that is probably a reasonable assessment.  Other conservative management strategies.  She is not optimizing her Tylenol.  Recommend to Tylenol arthritis at bedtime as bedtime pain is one of her biggest problems.  This should probably help and is safe.  Additionally recommend Voltaren gel.  Discussed that she is always welcome to return anytime.  Happy to do a steroid injection which should help at least for a while..  At some point she may needed shoulder replacement.  PDMP not reviewed this encounter. Orders Placed This Encounter  Procedures   [place] LIMITED JOINT SPACE STRUCTURES UP RIGHT(NO LINKED CHARGES)    Standing Status:   Future    Number of Occurrences:   1    Standing Expiration Date:   09/29/2021    Order Specific Question:   Reason for Exam (SYMPTOM  OR DIAGNOSIS REQUIRED)    Answer:   right shoulder pain    Order Specific Question:   Preferred imaging location?    Answer:   [facility]   No orders of the defined types were placed in this encounter.   Discussed warning signs or symptoms. Please see discharge instructions. Patient expresses understanding.   The above documentation has been reviewed and is accurate and complete Lynne Leader, M.D.

## 2021-04-01 ENCOUNTER — Ambulatory Visit: Payer: Self-pay

## 2021-04-01 ENCOUNTER — Other Ambulatory Visit: Payer: Self-pay

## 2021-04-01 ENCOUNTER — Ambulatory Visit (INDEPENDENT_AMBULATORY_CARE_PROVIDER_SITE_OTHER): Payer: Medicare Other | Admitting: Family Medicine

## 2021-04-01 VITALS — BP 128/80 | HR 71 | Ht 65.0 in | Wt 133.2 lb

## 2021-04-01 DIAGNOSIS — M818 Other osteoporosis without current pathological fracture: Secondary | ICD-10-CM | POA: Diagnosis not present

## 2021-04-01 DIAGNOSIS — G8929 Other chronic pain: Secondary | ICD-10-CM | POA: Diagnosis not present

## 2021-04-01 DIAGNOSIS — M19011 Primary osteoarthritis, right shoulder: Secondary | ICD-10-CM | POA: Diagnosis not present

## 2021-04-01 DIAGNOSIS — Z7952 Long term (current) use of systemic steroids: Secondary | ICD-10-CM

## 2021-04-01 DIAGNOSIS — M25511 Pain in right shoulder: Secondary | ICD-10-CM

## 2021-04-01 NOTE — Patient Instructions (Addendum)
Thank you for coming in today.   Try tylenol arthritis 2 at bedtime.   Please use Voltaren gel (Generic Diclofenac Gel) up to 4x daily for pain as needed.  This is available over-the-counter as both the name brand Voltaren gel and the generic diclofenac gel.   I can do an injection at any time.   You have severe arthritis in the shoulder. You may need a shoulder replacement.  Recheck with me at anytime for this or other problems.

## 2021-04-08 ENCOUNTER — Ambulatory Visit (INDEPENDENT_AMBULATORY_CARE_PROVIDER_SITE_OTHER): Payer: Medicare Other | Admitting: Adult Health

## 2021-04-08 ENCOUNTER — Encounter: Payer: Self-pay | Admitting: Adult Health

## 2021-04-08 VITALS — BP 120/76 | HR 63 | Ht 65.0 in | Wt 132.0 lb

## 2021-04-08 DIAGNOSIS — I639 Cerebral infarction, unspecified: Secondary | ICD-10-CM

## 2021-04-08 NOTE — Progress Notes (Signed)
[facility] [address] [place]. [place] [address] [phone]       STROKE FOLLOW UP NOTE  [name] Date of Birth:  1941-02-09 Medical Record Number:  [id number]   Reason for Referral: stroke follow up    SUBJECTIVE:   CHIEF COMPLAINT:  Chief Complaint  Patient presents with   Follow-up    RM 3 alone Pt is well and stable, no new concerns      HPI:   Update 04/08/2021 JM: returns for 6 month stroke follow up. Overall stable. Denies new or reoccurring stroke/TIA symptoms. Continues to maintain ADLs and IADLs independently. Compliant on Eliquis and atorvastatin without side effects. Blood pressure today 120/76. Has not yet made f/u visit with cardiology but plans on calling to schedule. Is scheduled to see her rheumatologist next month.  Routinely followed by PCP.  No new concerns at this time.    History provided for reference purposes only Update 09/25/2020 JM: Felicia Hunter returns for 84-month stroke follow-up.  She has been doing well since prior visit without new stroke/TIA symptoms. Reports improvement of cognition currently at baseline and occasional.  She continues to live alone and maintains ADLs and IADLs independently without difficulty.  Reports compliance on Eliquis and atorvastatin without associated side effects.  Blood pressure today 132/78.  No new concerns at this time.  Initial visit 05/21/2020 JM: Felicia Hunter is being seen for hospital follow-up accompanied by her daughter Felicia Hunter.  Reports residual mild slurred speech but gradually improving -typically only occurs towards the end of the day, increased fatigue, heightened emotions or when trying to speak too quickly Per daughter, some difficulty with focusing or paying attention which has been slightly improving Released by PT/OT -initial evaluation with SLP scheduled 3/23 Lives alone and maintains ADLs and IADLs independently - she keeps very active in her community Denies new  stroke/TIA symptoms  Compliant on Eliquis 5 mg twice daily -denies side effects Complaint on atorvastatin 10mg  daily - denies side effects Blood pressure today 131/78   Recent lipid panel showed LDL 75 (04/2020)  No further concerns at this time  Stroke admission 04/24/2020 Felicia Hunter is a 81 y.o. female with history of with A- fib on Xarelto (off since 04/22/20 for colonoscopy, Last dose on 04/21/20) , hyperlipidemia, hypertension, MVP, CVA x2 (2013, 2014) no baseline neurological deficits, breast cancer s/p bilateral mastectomy 2011, polymyalgia rheumatica, GERD, colon polyps, s/p hysterectomy, hip and total knee replacement who presented to [facility] on 04/24/2020 after acute onset of slurred speech and left sided weakness.  Personally reviewed hospitalization pertinent progress notes, lab work and imaging with summary provided.  Evaluated by Dr. Erlinda Hong with stroke work-up revealing acute right MCA infarct in setting of thrombus and right MCA bifurcation s/p IR with TICI 2C revascularization, likely secondary to PAF off Xarelto for procedure.  Recommended initiating Eliquis for history of A. fib and stroke prevention.  History of prior left MCA stroke in 2015 s/p ILR with evidence of A. fib in 2018 and placed on Xarelto.  LDL 59.  A1c 5.8.  Other stroke risk factors include advanced age and prior strokes.   Stroke - Acute right MCA infarct due to thrombus in the right MCA bifurcation, S/P IR with TICI2C revascularization - Etilogy likely due to PAF off Xeralto  CT Code Stroke No acute intracranial hemorrhage. Acute infarction involving right temporal lobe and posterior insula. ASPECT score is 8. CTA Head and Neck- Thrombus at the right MCA bifurcation  occluding one branch origin and partially occluding the other.  MRI (04/25/20)- Relatively small acute cortical infarct in the posterolateral right temporal lobe, and scattered additional punctate infarcts in the right lentiform, perirolandic cortex. No  associated hemorrhage or mass effect.  MRA- Intracranial MRA is negative for large vessel occlusion.  2D Echo-  LVEF of 65 to 70%, right atrial size mildly dilated, trace MVR. LDL 59 HgbA1c 5.8 VTE prophylaxis - SCDs Xarelto (rivaroxaban) daily prior to admission (off 3 days for colonoscopy), now on aspirin 325 mg daily. Consider to start eliquis tomorrow afternoon   Therapy recommendations:  outpt PT/OT/SLP Disposition:  home   ROS:   14 system review of systems performed and negative with exception of those listed in HPI  PMH:  Past Medical History:  Diagnosis Date   Allergy    Alopecia    Arthritis    Atrial fibrillation ([place])    Breast cancer ([place])    double mastectomy 2011   Clavicle fracture    right clavicle-hardware retained   Clotting disorder ([place])    with CVA- 2013, 2014   Cough    PERSISTANT COUGH X 20 YRS   Difficulty sleeping    takes Ambien   Disorders of bilirubin excretion    Diverticulosis    Environmental and seasonal allergies    "dry cough", related to ? allergies   GERD (gastroesophageal reflux disease)    Hyperlipidemia    MVP (mitral valve prolapse)    Neuromuscular disorder ([place])    Polypmyalgia  Pheumatica   Osteopenia    Osteoporosis    Stroke ([place]) 06/07/11   x2 episodes-"funny feeling"" equilibrium off x24 hours, x1 speech affected- no residuallast 1 yr ago.   Tubulovillous adenoma of colon 08/2003    PSH:  Past Surgical History:  Procedure Laterality Date   ABDOMINAL HYSTERECTOMY  1993   BREAST CAPSULOTOMY     after bilat mastectomies-implants removed   BREAST ENHANCEMENT SURGERY     x3-then removed 2011 for bilat mastectomies   BREAST SURGERY  june 2011   double mastectomy nodes from rt   [place]  5/12   noduals removed-neg   collarbone  september 2011   pinned and plated, right   COLONOSCOPY     IR ANGIO EXTRACRAN SEL COM CAROTID INNOMINATE UNI R MOD SED  04/24/2020   IR CT HEAD LTD  04/24/2020   IR  PERCUTANEOUS ART THROMBECTOMY/INFUSION INTRACRANIAL INC DIAG ANGIO  04/24/2020   JOINT REPLACEMENT  july 9th, 2012   left knee replacement , Right knee replacement 09-13-17 Dr. Alvan Dame   KNEE RECONSTRUCTION  09/2010   revision lt total knee   left   LOOP RECORDER IMPLANT N/A 11/07/2013   Procedure: LOOP RECORDER IMPLANT;  Surgeon: Evans Lance, MD;  Location: [facility] CATH LAB;  Service: Cardiovascular;  Laterality: N/A;   LOOP RECORDER REMOVAL N/A 05/16/2017   Procedure: LOOP RECORDER REMOVAL;  Surgeon: Evans Lance, MD;  Location: [facility] CV LAB;  Service: Cardiovascular;  Laterality: N/A;   MASS EXCISION Left 06/19/2012   Procedure: EXCISION OF LARGE MASS LEFT NECK/SHOULDER WITH PLASTIC CLOSURE;  Surgeon: Cristine Polio, MD;  Location: [facility];  Service: Plastics;  Laterality: Left;   NASAL SEPTUM SURGERY     RADIOLOGY WITH ANESTHESIA N/A 04/24/2020   Procedure: IR WITH ANESTHESIA;  Surgeon: Radiologist, Medication, MD;  Location: [facility];  Service: Radiology;  Laterality: N/A;   TEE WITHOUT CARDIOVERSION N/A 11/07/2013   Procedure:  TRANSESOPHAGEAL ECHOCARDIOGRAM (TEE);  Surgeon: Dorothy Spark, MD;  Location: [facility];  Service: Cardiovascular;  Laterality: N/A;   TONSILLECTOMY     TOTAL HIP ARTHROPLASTY Right 04/10/2013   Procedure: RIGHT TOTAL HIP ARTHROPLASTY ANTERIOR APPROACH;  Surgeon: [name], MD;  Location: [facility];  Service: Orthopedics;  Laterality: Right;   TOTAL HIP ARTHROPLASTY Left 12/31/2014   Procedure: LEFT TOTAL HIP ARTHROPLASTY ANTERIOR APPROACH;  Surgeon: Paralee Cancel, MD;  Location: [facility];  Service: Orthopedics;  Laterality: Left;   TOTAL KNEE ARTHROPLASTY Right 09/13/2017   Procedure: RIGHT TOTAL KNEE ARTHROPLASTY;  Surgeon: Paralee Cancel, MD;  [facility]: [facility];  Service: Orthopedics;  Laterality: Right;  70 mins   UPPER GASTROINTESTINAL ENDOSCOPY     VEIN SURGERY  1974   left leg    Social History:  Social History   Socioeconomic History    Marital status: Widowed    Spouse name: Not on file   Number of children: 2   Years of education: college   Highest education level: Not on file  Occupational History   Occupation: retired    Fish farm manager: Retired    Fish farm manager: RETIRED  Tobacco Use   Smoking status: Never   Smokeless tobacco: Never  Vaping Use   Vaping Use: Never used  Substance and Sexual Activity   Alcohol use: Yes    Alcohol/week: 1.0 standard drink    Types: 1 Glasses of wine per week    Comment: 1 glass of wine per night   Drug use: No   Sexual activity: Not Currently  Other Topics Concern   Not on file  Social History Narrative   Patient is single with 2 children.    Patient is left handed.   Patient has college education.   Patient drinks coffee daily   Social Determinants of Health   Financial Resource Strain: Not on file  Food Insecurity: No Food Insecurity   Worried About Charity fundraiser in the Last Year: Never true   Ran Out of Food in the Last Year: Never true  Transportation Needs: No Transportation Needs   Lack of Transportation (Medical): No   Lack of Transportation (Non-Medical): No  Physical Activity: Not on file  Stress: Not on file  Social Connections: Not on file  Intimate Partner Violence: Not on file    Family History:  Family History  Problem Relation Age of Onset   Aneurysm Father    Colon cancer Neg Hx    Rectal cancer Neg Hx    Stomach cancer Neg Hx    Esophageal cancer Neg Hx     Medications:   Current Outpatient Medications on File Prior to Visit  Medication Sig Dispense Refill   acetaminophen (TYLENOL) 500 MG tablet Take 1,000 mg by mouth as needed for mild pain.     alendronate (FOSAMAX) 70 MG tablet Take 70 mg by mouth every Sunday.      apixaban (ELIQUIS) 5 MG TABS tablet Take 1 tablet (5 mg total) by mouth 2 (two) times daily. 60 tablet 2   atorvastatin (LIPITOR) 20 MG tablet 1 tablet     BIOTIN PO Take 1 tablet by mouth daily.     Calcium  Carb-Cholecalciferol (CALCIUM 600/VITAMIN D3 PO) Take 1 tablet by mouth daily.     cholecalciferol (VITAMIN D) 1000 UNITS tablet Take 1,000 Units by mouth daily.     Multiple Vitamin (MULTIVITAMIN WITH MINERALS) TABS tablet Take 1 tablet by mouth daily. 30 tablet  Omega-3 Fatty Acids (FISH OIL PO) Take 1 capsule by mouth daily.     polyvinyl alcohol (LIQUIFILM TEARS) 1.4 % ophthalmic solution Place 1 drop into both eyes as needed for dry eyes.      PREDNISONE PO Take 3 mg by mouth daily.     vitamin C (ASCORBIC ACID) 500 MG tablet Take 500 mg by mouth daily as needed (for cold symptoms).      zolpidem (AMBIEN) 10 MG tablet Take 10 mg by mouth at bedtime as needed for sleep.      No current facility-administered medications on file prior to visit.    Allergies:   Allergies  Allergen Reactions   Sulfonamide Derivatives Rash   Sulfacetamide Sodium-Sulfur Rash      OBJECTIVE:  Physical Exam  Vitals:   04/08/21 1336  BP: 120/76  Pulse: 63  Weight: 132 lb (59.9 kg)  Height: 5\' 5"  (1.651 m)   Body mass index is 21.97 kg/m. No results found.  General: well developed, well nourished, very pleasant elderly Caucasian female, seated, in no evident distress Head: head normocephalic and atraumatic.   Neck: supple with no carotid or supraclavicular bruits Cardiovascular: regular rate and rhythm, no murmurs Musculoskeletal: no deformity Skin:  no rash/petichiae Vascular:  Normal pulses all extremities   Neurologic Exam Mental Status: Awake and fully alert.   Unable to appreciate dysarthria or aphasia during visit.  Oriented to place and time. Recent and remote memory intact. Attention span, concentration and fund of knowledge appropriate. Mood and affect appropriate.  Cranial Nerves: Pupils equal, briskly reactive to light. Extraocular movements full without nystagmus. Visual fields full to confrontation.  HOH bilaterally. Facial sensation intact. Face, tongue, palate moves normally  and symmetrically.  Motor: Normal bulk and tone. Normal strength in all tested extremity muscles Sensory.: intact to touch , pinprick , position and vibratory sensation.  Coordination: Rapid alternating movements normal in all extremities. Finger-to-nose and heel-to-shin performed accurately bilaterally. Gait and Station: Arises from chair without difficulty. Stance is normal. Gait demonstrates normal stride length and balance with without use of assistive device.  Reflexes: 1+ and symmetric. Toes downgoing.       ASSESSMENT: RAMON BRANT is a 81 y.o. year old female presented with slurred speech and left-sided weakness on 04/24/2020 with stroke work-up revealing acute right MCA infarct due to thrombus in right MCA bifurcation, s/p IR with TICI 2C revascularization likely secondary to PAF off Xarelto for colonoscopy. Vascular risk factors include atrial fibrillation, prior stroke history, HTN, HLD and advanced age.      PLAN:  R MCA stroke :  Continue Eliquis (apixaban) daily  and atorvastatin for secondary stroke prevention.   Discussed secondary stroke prevention measures and importance of close PCP follow up for aggressive stroke risk factor management  No medications are managed by this office Atrial fibrillation: On Eliquis 5 mg twice daily for CHA2DS2-VASc score of at least 6. Managed by cardiology. Advised to schedule f/u visit with cardiology as overdue HTN: BP goal <130/90.  Stable on current regimen per PCP HLD: LDL goal <70. Recent LDL 62 (03/2021) on atorvastatin 10 mg daily managed by PCP   Overall stable -no further recommendations from stroke standpoint.  Follow-up as needed   CC:  PCP: Deland Pretty, MD    I spent 26 minutes of face-to-face and non-face-to-face time with patient.  This included previsit chart review, lab review, study review, electronic health record documentation, patient education regarding prior stroke including etiology, secondary stroke  prevention measures and importance of managing stroke risk factors, and answered all other questions to patients satisfaction  Frann Rider, [facility]  [facility] [address] [place], [place] [address]  Phone [phone] Fax [phone] Note: This document was prepared with digital dictation and possible smart phrase technology. Any transcriptional errors that result from this process are unintentional.

## 2021-04-08 NOTE — Patient Instructions (Signed)
Continue Eliquis (apixaban) daily  and atorvastatin for secondary stroke prevention  Continue to follow up with PCP regarding cholesterol and blood pressure management  Maintain strict control of hypertension with blood pressure goal below 130/90 and cholesterol with LDL cholesterol (bad cholesterol) goal below 70 mg/dL.   Signs of a Stroke? Follow the BEFAST method:  Balance Watch for a sudden loss of balance, trouble with coordination or vertigo Eyes Is there a sudden loss of vision in one or both eyes? Or double vision?  Face: Ask the person to smile. Does one side of the face droop or is it numb?  Arms: Ask the person to raise both arms. Does one arm drift downward? Is there weakness or numbness of a leg? Speech: Ask the person to repeat a simple phrase. Does the speech sound slurred/strange? Is the person confused ? Time: If you observe any of these signs, call 911.      Thank you for coming to see [place] at [facility]. I hope we have been able to provide you high quality care today.  You may receive a patient satisfaction survey over the next few weeks. We would appreciate your feedback and comments so that we may continue to improve ourselves and the health of our patients.

## 2021-05-06 ENCOUNTER — Telehealth: Payer: Self-pay | Admitting: Adult Health

## 2021-05-06 NOTE — Telephone Encounter (Signed)
Called patient to update her on some changes that were made to her appointment. Left message. Patient will be mailed an updated calendar.

## 2021-05-06 NOTE — Telephone Encounter (Signed)
Called patient about her upcoming appointment with Thedore Mins. Left message. Patient will be mailed an updated calendar.

## 2021-05-19 NOTE — Progress Notes (Unsigned)
PCP:  Deland Pretty, MD Primary Cardiologist: Cristopher Peru, MD Electrophysiologist: Cristopher Peru, MD   Felicia Hunter is a 81 y.o. female seen today for Cristopher Peru, MD for {Blank single:19197::"cardiac clearance","post hospital follow up","acute visit due to ***","routine electrophysiology followup"}.  Since {Blank single:19197::"last being seen in our clinic","discharge from hospital"} the patient reports doing ***.  she denies chest pain, palpitations, dyspnea, PND, orthopnea, nausea, vomiting, dizziness, syncope, edema, weight gain, or early satiety.  Past Medical History:  Diagnosis Date   Allergy    Alopecia    Arthritis    Atrial fibrillation ([place])    Breast cancer ([place])    double mastectomy 2011   Clavicle fracture    right clavicle-hardware retained   Clotting disorder ([place])    with CVA- 2013, 2014   Cough    PERSISTANT COUGH X 20 YRS   Difficulty sleeping    takes Ambien   Disorders of bilirubin excretion    Diverticulosis    Environmental and seasonal allergies    "dry cough", related to ? allergies   GERD (gastroesophageal reflux disease)    Hyperlipidemia    MVP (mitral valve prolapse)    Neuromuscular disorder ([facility])    Polypmyalgia  Pheumatica   Osteopenia    Osteoporosis    Stroke ([place]) 06/07/11   x2 episodes-"funny feeling"" equilibrium off x24 hours, x1 speech affected- no residuallast 1 yr ago.   Tubulovillous adenoma of colon 08/2003   Past Surgical History:  Procedure Laterality Date   ABDOMINAL HYSTERECTOMY  1993   BREAST CAPSULOTOMY     after bilat mastectomies-implants removed   BREAST ENHANCEMENT SURGERY     x3-then removed 2011 for bilat mastectomies   BREAST SURGERY  june 2011   double mastectomy nodes from rt   [place]  5/12   noduals removed-neg   collarbone  september 2011   pinned and plated, right   COLONOSCOPY     IR ANGIO EXTRACRAN SEL COM CAROTID INNOMINATE UNI R MOD SED  04/24/2020   IR CT HEAD LTD  04/24/2020    IR PERCUTANEOUS ART THROMBECTOMY/INFUSION INTRACRANIAL INC DIAG ANGIO  04/24/2020   JOINT REPLACEMENT  july 9th, 2012   left knee replacement , Right knee replacement 09-13-17 Dr. Alvan Dame   KNEE RECONSTRUCTION  09/2010   revision lt total knee   left   LOOP RECORDER IMPLANT N/A 11/07/2013   Procedure: LOOP RECORDER IMPLANT;  Surgeon: Evans Lance, MD;  Location: [facility] CATH LAB;  Service: Cardiovascular;  Laterality: N/A;   LOOP RECORDER REMOVAL N/A 05/16/2017   Procedure: LOOP RECORDER REMOVAL;  Surgeon: Evans Lance, MD;  Location: [facility] CV LAB;  Service: Cardiovascular;  Laterality: N/A;   MASS EXCISION Left 06/19/2012   Procedure: EXCISION OF LARGE MASS LEFT NECK/SHOULDER WITH PLASTIC CLOSURE;  Surgeon: Cristine Polio, MD;  Location: [facility];  Service: Plastics;  Laterality: Left;   NASAL SEPTUM SURGERY     RADIOLOGY WITH ANESTHESIA N/A 04/24/2020   Procedure: IR WITH ANESTHESIA;  Surgeon: Radiologist, Medication, MD;  Location: [facility];  Service: Radiology;  Laterality: N/A;   TEE WITHOUT CARDIOVERSION N/A 11/07/2013   Procedure: TRANSESOPHAGEAL ECHOCARDIOGRAM (TEE);  Surgeon: Dorothy Spark, MD;  Location: [place] [facility] [place];  Service: Cardiovascular;  Laterality: N/A;   TONSILLECTOMY     TOTAL HIP ARTHROPLASTY Right 04/10/2013   Procedure: RIGHT TOTAL HIP ARTHROPLASTY ANTERIOR APPROACH;  Surgeon: [name], MD;  [facility];  Service: Orthopedics;  Laterality: Right;   TOTAL HIP ARTHROPLASTY Left 12/31/2014   Procedure: LEFT TOTAL HIP ARTHROPLASTY ANTERIOR APPROACH;  Surgeon: Paralee Cancel, MD;  [facility];  Service: Orthopedics;  Laterality: Left;   TOTAL KNEE ARTHROPLASTY Right 09/13/2017   Procedure: RIGHT TOTAL KNEE ARTHROPLASTY;  Surgeon: Paralee Cancel, MD;  [facility];  Service: Orthopedics;  Laterality: Right;  70 mins   UPPER GASTROINTESTINAL ENDOSCOPY     VEIN SURGERY  1974   left leg    Current Outpatient Medications  Medication Sig Dispense  Refill   acetaminophen (TYLENOL) 500 MG tablet Take 1,000 mg by mouth as needed for mild pain.     alendronate (FOSAMAX) 70 MG tablet Take 70 mg by mouth every Sunday.      apixaban (ELIQUIS) 5 MG TABS tablet Take 1 tablet (5 mg total) by mouth 2 (two) times daily. 60 tablet 2   atorvastatin (LIPITOR) 20 MG tablet 1 tablet     BIOTIN PO Take 1 tablet by mouth daily.     Calcium Carb-Cholecalciferol (CALCIUM 600/VITAMIN D3 PO) Take 1 tablet by mouth daily.     cholecalciferol (VITAMIN D) 1000 UNITS tablet Take 1,000 Units by mouth daily.     Multiple Vitamin (MULTIVITAMIN WITH MINERALS) TABS tablet Take 1 tablet by mouth daily. 30 tablet    Omega-3 Fatty Acids (FISH OIL PO) Take 1 capsule by mouth daily.     polyvinyl alcohol (LIQUIFILM TEARS) 1.4 % ophthalmic solution Place 1 drop into both eyes as needed for dry eyes.      PREDNISONE PO Take 3 mg by mouth daily.     vitamin C (ASCORBIC ACID) 500 MG tablet Take 500 mg by mouth daily as needed (for cold symptoms).      zolpidem (AMBIEN) 10 MG tablet Take 10 mg by mouth at bedtime as needed for sleep.      No current facility-administered medications for this visit.    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    Patient is left handed.   Patient has college education.   Patient drinks coffee daily   Social Determinants of Health   Financial Resource Strain: Not on file  Food Insecurity: Not on file  Transportation Needs: Not on file  Physical Activity: Not on file  Stress: Not on file  Social Connections: Not on file  Intimate Partner Violence: Not on file     Review of Systems: All other systems reviewed and are otherwise negative except as noted above.  Physical Exam: There were no vitals filed for this visit.  GEN-  The patient is well appearing, alert and oriented x 3 today.   HEENT: normocephalic, atraumatic; sclera clear, conjunctiva pink; hearing intact; oropharynx clear; neck supple, no JVP Lymph- no cervical lymphadenopathy Lungs- Clear to ausculation bilaterally, normal work of breathing.  No wheezes, rales, rhonchi Heart- Regular rate and rhythm, no murmurs, rubs or gallops, PMI not laterally displaced GI- soft, non-tender, non-distended, bowel sounds present, no hepatosplenomegaly Extremities- no clubbing, cyanosis, or edema; DP/PT/radial pulses 2+ bilaterally MS- no significant deformity or atrophy Skin- warm and dry, no rash or lesion Psych- euthymic mood, full affect Neuro- strength and sensation are intact  EKG is ordered. Personal review of EKG from today shows ***  Additional studies reviewed include: Previous EP office notes. ***  Assessment and Plan:  1. Paroxysmal AF Asymptomatic.  EKG today shows ***  Continue Xarelto for CHA2DS2VASC of at least 5 Denies bleeding Echo 04/2020 LVEF 65-70%, Grade 1 DD   2. HLD Continue statin   3. HTN Stable on current regimen    4. Polymyalgia rheumatica Symptoms greatly improved on chronic prednisone per rheumatologist. This is tapering to an unspecified chronic dose.   Follow up with {Blank single:19197::"Dr. Allred","Dr. Arlan Organ. Klein","Dr. Camnitz","Dr. Lambert","EP APP"} in {Blank  single:19197::"2 weeks","4 weeks","3 months","6 months","12 months","as usual post gen change"}   Shirley Friar, [place]  05/19/21 9:16 AM

## 2021-05-21 DIAGNOSIS — R700 Elevated erythrocyte sedimentation rate: Secondary | ICD-10-CM | POA: Diagnosis not present

## 2021-05-21 DIAGNOSIS — M25511 Pain in right shoulder: Secondary | ICD-10-CM | POA: Diagnosis not present

## 2021-05-21 DIAGNOSIS — Z7952 Long term (current) use of systemic steroids: Secondary | ICD-10-CM | POA: Diagnosis not present

## 2021-05-21 DIAGNOSIS — M1990 Unspecified osteoarthritis, unspecified site: Secondary | ICD-10-CM | POA: Diagnosis not present

## 2021-05-21 DIAGNOSIS — M2550 Pain in unspecified joint: Secondary | ICD-10-CM | POA: Diagnosis not present

## 2021-05-21 DIAGNOSIS — M549 Dorsalgia, unspecified: Secondary | ICD-10-CM | POA: Diagnosis not present

## 2021-05-21 DIAGNOSIS — M791 Myalgia, unspecified site: Secondary | ICD-10-CM | POA: Diagnosis not present

## 2021-05-21 DIAGNOSIS — M810 Age-related osteoporosis without current pathological fracture: Secondary | ICD-10-CM | POA: Diagnosis not present

## 2021-05-21 DIAGNOSIS — M79601 Pain in right arm: Secondary | ICD-10-CM | POA: Diagnosis not present

## 2021-05-21 DIAGNOSIS — M353 Polymyalgia rheumatica: Secondary | ICD-10-CM | POA: Diagnosis not present

## 2021-05-26 ENCOUNTER — Encounter: Payer: Self-pay | Admitting: Student

## 2021-05-26 ENCOUNTER — Other Ambulatory Visit: Payer: Self-pay

## 2021-05-26 ENCOUNTER — Ambulatory Visit (INDEPENDENT_AMBULATORY_CARE_PROVIDER_SITE_OTHER): Payer: Medicare Other | Admitting: Student

## 2021-05-26 VITALS — BP 132/84 | HR 65 | Ht 65.0 in | Wt 136.8 lb

## 2021-05-26 DIAGNOSIS — I159 Secondary hypertension, unspecified: Secondary | ICD-10-CM

## 2021-05-26 DIAGNOSIS — I639 Cerebral infarction, unspecified: Secondary | ICD-10-CM

## 2021-05-26 DIAGNOSIS — I480 Paroxysmal atrial fibrillation: Secondary | ICD-10-CM

## 2021-05-26 NOTE — Patient Instructions (Signed)
Medication Instructions:  Your physician recommends that you continue on your current medications as directed. Please refer to the Current Medication list given to you today.  *If you need a refill on your cardiac medications before your next appointment, please call your pharmacy*   Lab Work: None If you have labs (blood work) drawn today and your tests are completely normal, you will receive your results only by: MyChart Message (if you have MyChart) OR A paper copy in the mail If you have any lab test that is abnormal or we need to change your treatment, we will call you to review the results.   Follow-Up: At [facility], you and your health needs are our priority.  As part of our continuing mission to provide you with exceptional heart care, we have created designated Provider Care Teams.  These Care Teams include your primary Cardiologist (physician) and Advanced Practice Providers (APPs -  Physician Assistants and Nurse Practitioners) who all work together to provide you with the care you need, when you need it.  Your next appointment:   1 year(s)  The format for your next appointment:   In Person  Provider:   Gregg Taylor, MD   

## 2021-06-04 ENCOUNTER — Telehealth: Payer: Self-pay | Admitting: Adult Health

## 2021-06-04 ENCOUNTER — Inpatient Hospital Stay: Payer: Medicare Other | Admitting: Adult Health

## 2021-06-04 NOTE — Telephone Encounter (Signed)
Called pt to cancel per after hours request, appt cancelled, pt stated it had been 10 years since she was dismissed by dr., states she no longer feels the need to come, told pt to call if she would like future appt.  ?

## 2021-06-04 NOTE — Progress Notes (Deleted)
? ?CLINIC:  ?Survivorship  ? ?REASON FOR VISIT:  ?Routine follow-up for history of breast cancer.  ? ?BRIEF ONCOLOGIC HISTORY:  ?DCIS diagnosed in 2011, s/p bilateral mastectomies ? ? ?INTERVAL HISTORY:  ?Felicia Hunter presents to the [facility] today for routine follow-up for her history of breast cancer.  Overall, she reports feeling quite well.  ? ? ? ? ?REVIEW OF SYSTEMS:  ?Review of Systems  ?Constitutional:  Negative for appetite change, chills, fatigue, fever and unexpected weight change.  ?HENT:   Negative for hearing loss, lump/mass, sore throat and trouble swallowing.   ?Eyes:  Negative for eye problems and icterus.  ?Respiratory:  Negative for chest tightness, cough and shortness of breath.   ?Cardiovascular:  Negative for chest pain, leg swelling and palpitations.  ?Gastrointestinal:  Negative for abdominal distention, abdominal pain, constipation, diarrhea, nausea and vomiting.  ?Endocrine: Negative for hot flashes.  ?Genitourinary:  Negative for difficulty urinating.   ?Musculoskeletal:  Negative for arthralgias.  ?Skin:  Negative for itching and rash.  ?Neurological:  Negative for dizziness, extremity weakness, headaches and numbness.  ?Hematological:  Negative for adenopathy. Does not bruise/bleed easily.  ?Psychiatric/Behavioral:  Negative for depression. The patient is not nervous/anxious.   ?Breast: Denies any new nodularity, masses, tenderness, nipple changes, or nipple discharge.  ? ? ? ? ? ?PAST MEDICAL/SURGICAL HISTORY:  ?Past Medical History:  ?Diagnosis Date  ? Allergy   ? Alopecia   ? Arthritis   ? Atrial fibrillation ([place])   ? Breast cancer ([place])   ? double mastectomy 2011  ? Clavicle fracture   ? right clavicle-hardware retained  ? Clotting disorder ([place])   ? with CVA- 2013, 2014  ? Cough   ? PERSISTANT COUGH X 20 YRS  ? Difficulty sleeping   ? takes Ambien  ? Disorders of bilirubin excretion   ? Diverticulosis   ? Environmental and seasonal allergies   ? "dry cough", related to ?  allergies  ? GERD (gastroesophageal reflux disease)   ? Hyperlipidemia   ? MVP (mitral valve prolapse)   ? Neuromuscular disorder ([place])   ? Polypmyalgia  Pheumatica  ? Osteopenia   ? Osteoporosis   ? Stroke ([place]) 06/07/11  ? x2 episodes-"funny feeling"" equilibrium off x24 hours, x1 speech affected- no residuallast 1 yr ago.  ? Tubulovillous adenoma of colon 08/2003  ? ?Past Surgical History:  ?Procedure Laterality Date  ? ABDOMINAL HYSTERECTOMY  1993  ? BREAST CAPSULOTOMY    ? after bilat mastectomies-implants removed  ? BREAST ENHANCEMENT SURGERY    ? x3-then removed 2011 for bilat mastectomies  ? BREAST SURGERY  june 2011  ? double mastectomy nodes from rt  ? CHEST WALL TUMOR EXCISION  5/12  ? noduals removed-neg  ? collarbone  september 2011  ? pinned and plated, right  ? COLONOSCOPY    ? IR ANGIO EXTRACRAN SEL COM CAROTID INNOMINATE UNI R MOD SED  04/24/2020  ? IR CT HEAD LTD  04/24/2020  ? IR PERCUTANEOUS ART THROMBECTOMY/INFUSION INTRACRANIAL INC DIAG ANGIO  04/24/2020  ? JOINT REPLACEMENT  july 9th, 2012  ? left knee replacement , Right knee replacement 09-13-17 Dr. Alvan Dame  ? KNEE RECONSTRUCTION  09/2010  ? revision lt total knee   left  ? LOOP RECORDER IMPLANT N/A 11/07/2013  ? Procedure: LOOP RECORDER IMPLANT;  Surgeon: Evans Lance, MD;  Location: [facility] C[facility] LAB;  Service: Cardiovascular;  Laterality: N/A;  ? LOOP RECORDER REMOVAL N/A 05/16/2017  ? Procedure: LOOP RECORDER REMOVAL;  Surgeon: Evans Lance, MD;  Location: [place] CV LAB;  Service: Cardiovascular;  Laterality: N/A;  ? MASS EXCISION Left 06/19/2012  ? Procedure: EXCISION OF LARGE MASS LEFT NECK/SHOULDER WITH PLASTIC CLOSURE;  Surgeon: Cristine Polio, MD;  Location: [place];  Service: Plastics;  Laterality: Left;  ? NASAL SEPTUM SURGERY    ? RADIOLOGY WITH ANESTHESIA N/A 04/24/2020  ? Procedure: IR WITH ANESTHESIA;  Surgeon: Radiologist, Medication, MD;  Location: [place];  Service: Radiology;  Laterality: N/A;  ? TEE WITHOUT  CARDIOVERSION N/A 11/07/2013  ? Procedure: TRANSESOPHAGEAL ECHOCARDIOGRAM (TEE);  Surgeon: Dorothy Spark, MD;  Location: [place];  Service: Cardiovascular;  Laterality: N/A;  ? TONSILLECTOMY    ? TOTAL HIP ARTHROPLASTY Right 04/10/2013  ? Procedure: RIGHT TOTAL HIP ARTHROPLASTY ANTERIOR APPROACH;  Surgeon: [name], MD;  Location: [facility];  Service: Orthopedics;  Laterality: Right;  ? TOTAL HIP ARTHROPLASTY Left 12/31/2014  ? Procedure: LEFT TOTAL HIP ARTHROPLASTY ANTERIOR APPROACH;  Surgeon: Paralee Cancel, MD;  Location: [facility];  Service: Orthopedics;  Laterality: Left;  ? TOTAL KNEE ARTHROPLASTY Right 09/13/2017  ? Procedure: RIGHT TOTAL KNEE ARTHROPLASTY;  Surgeon: Paralee Cancel, MD;  Location: [facility];  Service: Orthopedics;  Laterality: Right;  70 mins  ? UPPER GASTROINTESTINAL ENDOSCOPY    ? [place]  ? left leg  ? ? ? ?ALLERGIES:  ?Allergies  ?Allergen Reactions  ? Sulfonamide Derivatives Rash  ? Sulfacetamide Sodium-Sulfur Rash  ? ? ? ?CURRENT MEDICATIONS:  ?Outpatient Encounter Medications as of 06/04/2021  ?Medication Sig Note  ? acetaminophen (TYLENOL) 500 MG tablet Take 1,000 mg by mouth as needed for mild pain.   ? alendronate (FOSAMAX) 70 MG tablet Take 70 mg by mouth every Sunday.    ? apixaban (ELIQUIS) 5 MG TABS tablet Take 1 tablet (5 mg total) by mouth 2 (two) times daily.   ? atorvastatin (LIPITOR) 20 MG tablet 1 tablet   ? BIOTIN PO Take 1 tablet by mouth daily.   ? Calcium Carb-Cholecalciferol (CALCIUM 600/VITAMIN D3 PO) Take 1 tablet by mouth daily.   ? cholecalciferol (VITAMIN D) 1000 UNITS tablet Take 1,000 Units by mouth daily.   ? Multiple Vitamin (MULTIVITAMIN WITH MINERALS) TABS tablet Take 1 tablet by mouth daily.   ? Omega-3 Fatty Acids (FISH OIL PO) Take 1 capsule by mouth daily.   ? polyvinyl alcohol (LIQUIFILM TEARS) 1.4 % ophthalmic solution Place 1 drop into both eyes as needed for dry eyes.    ? PREDNISONE PO Take 3 mg by mouth daily. 09/25/2020: 09/25/20 currently  taking 5 mg  ? vitamin C (ASCORBIC ACID) 500 MG tablet Take 500 mg by mouth daily as needed (for cold symptoms).    ? zolpidem (AMBIEN) 10 MG tablet Take 10 mg by mouth at bedtime as needed for sleep.    ? ?No facility-administered encounter medications on file as of 06/04/2021.  ? ? ? ?ONCOLOGIC FAMILY HISTORY:  ?Family History  ?Problem Relation Age of Onset  ? Aneurysm Father   ? Colon cancer Neg Hx   ? Rectal cancer Neg Hx   ? Stomach cancer Neg Hx   ? Esophageal cancer Neg Hx   ? ? ?GENETIC COUNSELING/TESTING: ?Not at this time ? ?SOCIAL HISTORY:  ?Social History  ? ?Socioeconomic History  ? Marital status: Widowed  ?  Spouse name: Not on file  ? Number of children: 2  ? Years of education: college  ? Highest education level: Not  on file  ?Occupational History  ? Occupation: retired  ?  Employer: Retired  ?  Employer: RETIRED  ?Tobacco Use  ? Smoking status: Never  ? Smokeless tobacco: Never  ?Vaping Use  ? Vaping Use: Never used  ?Substance and Sexual Activity  ? Alcohol use: Yes  ?  Alcohol/week: 1.0 standard drink  ?  Types: 1 Glasses of wine per week  ?  Comment: 1 glass of wine per night  ? Drug use: No  ? Sexual activity: Not Currently  ?Other Topics Concern  ? Not on file  ?Social History Narrative  ? Patient is single with 2 children.   ? Patient is left handed.  ? Patient has college education.  ? Patient drinks coffee daily  ? ?Social Determinants of Health  ? ?Financial Resource Strain: Not on file  ?Food Insecurity: Not on file  ?Transportation Needs: Not on file  ?Physical Activity: Not on file  ?Stress: Not on file  ?Social Connections: Not on file  ?Intimate Partner Violence: Not on file  ? ? ? ?PHYSICAL EXAMINATION:  ?Vital Signs: ?There were no vitals filed for this visit. ? ?There were no vitals filed for this visit. ? ?General: Well-nourished, well-appearing female in no acute distress.  Unaccompanied today.   ?HEENT: Head is normocephalic.  Pupils equal and reactive to light. Conjunctivae  clear without exudate.  Sclerae anicteric. Oral mucosa is pink, moist.  Oropharynx is pink without lesions or erythema.  ?Lymph: No cervical, supraclavicular, or infraclavicular lymphadenopathy noted on palpatio

## 2021-06-12 DIAGNOSIS — K219 Gastro-esophageal reflux disease without esophagitis: Secondary | ICD-10-CM | POA: Diagnosis not present

## 2021-06-12 DIAGNOSIS — M810 Age-related osteoporosis without current pathological fracture: Secondary | ICD-10-CM | POA: Diagnosis not present

## 2021-06-12 DIAGNOSIS — M1990 Unspecified osteoarthritis, unspecified site: Secondary | ICD-10-CM | POA: Diagnosis not present

## 2021-06-12 DIAGNOSIS — E7800 Pure hypercholesterolemia, unspecified: Secondary | ICD-10-CM | POA: Diagnosis not present

## 2021-07-13 DIAGNOSIS — R051 Acute cough: Secondary | ICD-10-CM | POA: Diagnosis not present

## 2021-07-13 DIAGNOSIS — R059 Cough, unspecified: Secondary | ICD-10-CM | POA: Diagnosis not present

## 2021-07-13 DIAGNOSIS — Z20822 Contact with and (suspected) exposure to covid-19: Secondary | ICD-10-CM | POA: Diagnosis not present

## 2021-08-04 DIAGNOSIS — Z23 Encounter for immunization: Secondary | ICD-10-CM | POA: Diagnosis not present

## 2021-10-01 DIAGNOSIS — M549 Dorsalgia, unspecified: Secondary | ICD-10-CM | POA: Diagnosis not present

## 2021-10-01 DIAGNOSIS — M25511 Pain in right shoulder: Secondary | ICD-10-CM | POA: Diagnosis not present

## 2021-10-01 DIAGNOSIS — M1990 Unspecified osteoarthritis, unspecified site: Secondary | ICD-10-CM | POA: Diagnosis not present

## 2021-10-01 DIAGNOSIS — R700 Elevated erythrocyte sedimentation rate: Secondary | ICD-10-CM | POA: Diagnosis not present

## 2021-10-01 DIAGNOSIS — M2550 Pain in unspecified joint: Secondary | ICD-10-CM | POA: Diagnosis not present

## 2021-10-01 DIAGNOSIS — M79644 Pain in right finger(s): Secondary | ICD-10-CM | POA: Diagnosis not present

## 2021-10-01 DIAGNOSIS — M810 Age-related osteoporosis without current pathological fracture: Secondary | ICD-10-CM | POA: Diagnosis not present

## 2021-10-01 DIAGNOSIS — Z7952 Long term (current) use of systemic steroids: Secondary | ICD-10-CM | POA: Diagnosis not present

## 2021-10-01 DIAGNOSIS — M353 Polymyalgia rheumatica: Secondary | ICD-10-CM | POA: Diagnosis not present

## 2021-10-01 DIAGNOSIS — M791 Myalgia, unspecified site: Secondary | ICD-10-CM | POA: Diagnosis not present

## 2021-10-23 DIAGNOSIS — H2513 Age-related nuclear cataract, bilateral: Secondary | ICD-10-CM | POA: Diagnosis not present

## 2021-10-23 DIAGNOSIS — H5213 Myopia, bilateral: Secondary | ICD-10-CM | POA: Diagnosis not present

## 2021-12-15 DIAGNOSIS — Z23 Encounter for immunization: Secondary | ICD-10-CM | POA: Diagnosis not present

## 2022-02-02 DIAGNOSIS — M79644 Pain in right finger(s): Secondary | ICD-10-CM | POA: Diagnosis not present

## 2022-02-02 DIAGNOSIS — M353 Polymyalgia rheumatica: Secondary | ICD-10-CM | POA: Diagnosis not present

## 2022-02-02 DIAGNOSIS — M25511 Pain in right shoulder: Secondary | ICD-10-CM | POA: Diagnosis not present

## 2022-02-02 DIAGNOSIS — R700 Elevated erythrocyte sedimentation rate: Secondary | ICD-10-CM | POA: Diagnosis not present

## 2022-02-02 DIAGNOSIS — M2550 Pain in unspecified joint: Secondary | ICD-10-CM | POA: Diagnosis not present

## 2022-02-02 DIAGNOSIS — Z7952 Long term (current) use of systemic steroids: Secondary | ICD-10-CM | POA: Diagnosis not present

## 2022-02-02 DIAGNOSIS — M1990 Unspecified osteoarthritis, unspecified site: Secondary | ICD-10-CM | POA: Diagnosis not present

## 2022-02-02 DIAGNOSIS — M791 Myalgia, unspecified site: Secondary | ICD-10-CM | POA: Diagnosis not present

## 2022-02-02 DIAGNOSIS — M549 Dorsalgia, unspecified: Secondary | ICD-10-CM | POA: Diagnosis not present

## 2022-02-02 DIAGNOSIS — M810 Age-related osteoporosis without current pathological fracture: Secondary | ICD-10-CM | POA: Diagnosis not present

## 2022-02-03 DIAGNOSIS — D0462 Carcinoma in situ of skin of left upper limb, including shoulder: Secondary | ICD-10-CM | POA: Diagnosis not present

## 2022-02-03 DIAGNOSIS — C44729 Squamous cell carcinoma of skin of left lower limb, including hip: Secondary | ICD-10-CM | POA: Diagnosis not present

## 2022-02-03 DIAGNOSIS — D485 Neoplasm of uncertain behavior of skin: Secondary | ICD-10-CM | POA: Diagnosis not present

## 2022-02-03 DIAGNOSIS — D0461 Carcinoma in situ of skin of right upper limb, including shoulder: Secondary | ICD-10-CM | POA: Diagnosis not present

## 2022-03-02 DIAGNOSIS — Z7952 Long term (current) use of systemic steroids: Secondary | ICD-10-CM | POA: Diagnosis not present

## 2022-03-02 DIAGNOSIS — M1990 Unspecified osteoarthritis, unspecified site: Secondary | ICD-10-CM | POA: Diagnosis not present

## 2022-03-02 DIAGNOSIS — M791 Myalgia, unspecified site: Secondary | ICD-10-CM | POA: Diagnosis not present

## 2022-03-02 DIAGNOSIS — Z79899 Other long term (current) drug therapy: Secondary | ICD-10-CM | POA: Diagnosis not present

## 2022-03-02 DIAGNOSIS — M79644 Pain in right finger(s): Secondary | ICD-10-CM | POA: Diagnosis not present

## 2022-03-02 DIAGNOSIS — R700 Elevated erythrocyte sedimentation rate: Secondary | ICD-10-CM | POA: Diagnosis not present

## 2022-03-02 DIAGNOSIS — M353 Polymyalgia rheumatica: Secondary | ICD-10-CM | POA: Diagnosis not present

## 2022-03-02 DIAGNOSIS — M0579 Rheumatoid arthritis with rheumatoid factor of multiple sites without organ or systems involvement: Secondary | ICD-10-CM | POA: Diagnosis not present

## 2022-03-02 DIAGNOSIS — M25511 Pain in right shoulder: Secondary | ICD-10-CM | POA: Diagnosis not present

## 2022-03-02 DIAGNOSIS — M810 Age-related osteoporosis without current pathological fracture: Secondary | ICD-10-CM | POA: Diagnosis not present

## 2022-03-02 DIAGNOSIS — M2550 Pain in unspecified joint: Secondary | ICD-10-CM | POA: Diagnosis not present

## 2022-03-02 DIAGNOSIS — M549 Dorsalgia, unspecified: Secondary | ICD-10-CM | POA: Diagnosis not present

## 2022-03-25 DIAGNOSIS — M810 Age-related osteoporosis without current pathological fracture: Secondary | ICD-10-CM | POA: Diagnosis not present

## 2022-03-25 DIAGNOSIS — E7800 Pure hypercholesterolemia, unspecified: Secondary | ICD-10-CM | POA: Diagnosis not present

## 2022-03-26 LAB — LAB REPORT - SCANNED: EGFR: 56

## 2022-03-29 DIAGNOSIS — Z23 Encounter for immunization: Secondary | ICD-10-CM | POA: Diagnosis not present

## 2022-03-29 DIAGNOSIS — D6869 Other thrombophilia: Secondary | ICD-10-CM | POA: Diagnosis not present

## 2022-03-29 DIAGNOSIS — I480 Paroxysmal atrial fibrillation: Secondary | ICD-10-CM | POA: Diagnosis not present

## 2022-03-29 DIAGNOSIS — Z853 Personal history of malignant neoplasm of breast: Secondary | ICD-10-CM | POA: Diagnosis not present

## 2022-03-29 DIAGNOSIS — Z0000 Encounter for general adult medical examination without abnormal findings: Secondary | ICD-10-CM | POA: Diagnosis not present

## 2022-03-29 DIAGNOSIS — E7800 Pure hypercholesterolemia, unspecified: Secondary | ICD-10-CM | POA: Diagnosis not present

## 2022-03-29 DIAGNOSIS — M810 Age-related osteoporosis without current pathological fracture: Secondary | ICD-10-CM | POA: Diagnosis not present

## 2022-03-29 DIAGNOSIS — Z8673 Personal history of transient ischemic attack (TIA), and cerebral infarction without residual deficits: Secondary | ICD-10-CM | POA: Diagnosis not present

## 2022-03-29 DIAGNOSIS — I700 Atherosclerosis of aorta: Secondary | ICD-10-CM | POA: Diagnosis not present

## 2022-03-29 DIAGNOSIS — M353 Polymyalgia rheumatica: Secondary | ICD-10-CM | POA: Diagnosis not present

## 2022-03-29 DIAGNOSIS — K219 Gastro-esophageal reflux disease without esophagitis: Secondary | ICD-10-CM | POA: Diagnosis not present

## 2022-06-01 DIAGNOSIS — M8589 Other specified disorders of bone density and structure, multiple sites: Secondary | ICD-10-CM | POA: Diagnosis not present

## 2022-06-01 DIAGNOSIS — M810 Age-related osteoporosis without current pathological fracture: Secondary | ICD-10-CM | POA: Diagnosis not present

## 2022-06-21 ENCOUNTER — Other Ambulatory Visit: Payer: Self-pay

## 2022-06-21 ENCOUNTER — Other Ambulatory Visit (HOSPITAL_COMMUNITY): Payer: Self-pay

## 2022-06-21 MED ORDER — TYMLOS 3120 MCG/1.56ML ~~LOC~~ SOPN: PEN_INJECTOR | SUBCUTANEOUS | 11 refills | Status: DC

## 2022-06-22 ENCOUNTER — Other Ambulatory Visit (HOSPITAL_COMMUNITY): Payer: Self-pay

## 2022-06-22 DIAGNOSIS — M1990 Unspecified osteoarthritis, unspecified site: Secondary | ICD-10-CM | POA: Diagnosis not present

## 2022-06-22 DIAGNOSIS — Z79899 Other long term (current) drug therapy: Secondary | ICD-10-CM | POA: Diagnosis not present

## 2022-06-22 DIAGNOSIS — M810 Age-related osteoporosis without current pathological fracture: Secondary | ICD-10-CM | POA: Diagnosis not present

## 2022-06-22 DIAGNOSIS — M0579 Rheumatoid arthritis with rheumatoid factor of multiple sites without organ or systems involvement: Secondary | ICD-10-CM | POA: Diagnosis not present

## 2022-06-24 ENCOUNTER — Other Ambulatory Visit (HOSPITAL_COMMUNITY): Payer: Self-pay

## 2022-06-24 DIAGNOSIS — I480 Paroxysmal atrial fibrillation: Secondary | ICD-10-CM | POA: Diagnosis not present

## 2022-06-24 DIAGNOSIS — M810 Age-related osteoporosis without current pathological fracture: Secondary | ICD-10-CM | POA: Diagnosis not present

## 2022-06-24 DIAGNOSIS — I700 Atherosclerosis of aorta: Secondary | ICD-10-CM | POA: Diagnosis not present

## 2022-07-14 DIAGNOSIS — M810 Age-related osteoporosis without current pathological fracture: Secondary | ICD-10-CM | POA: Diagnosis not present

## 2022-07-15 DIAGNOSIS — R42 Dizziness and giddiness: Secondary | ICD-10-CM | POA: Diagnosis not present

## 2022-07-15 DIAGNOSIS — R030 Elevated blood-pressure reading, without diagnosis of hypertension: Secondary | ICD-10-CM | POA: Diagnosis not present

## 2022-07-29 DIAGNOSIS — R0609 Other forms of dyspnea: Secondary | ICD-10-CM | POA: Diagnosis not present

## 2022-07-29 DIAGNOSIS — Z8673 Personal history of transient ischemic attack (TIA), and cerebral infarction without residual deficits: Secondary | ICD-10-CM | POA: Diagnosis not present

## 2022-09-27 DIAGNOSIS — H2513 Age-related nuclear cataract, bilateral: Secondary | ICD-10-CM | POA: Diagnosis not present

## 2022-09-27 DIAGNOSIS — H524 Presbyopia: Secondary | ICD-10-CM | POA: Diagnosis not present

## 2022-10-13 DIAGNOSIS — Z79899 Other long term (current) drug therapy: Secondary | ICD-10-CM | POA: Diagnosis not present

## 2022-10-13 DIAGNOSIS — M25511 Pain in right shoulder: Secondary | ICD-10-CM | POA: Diagnosis not present

## 2022-10-13 DIAGNOSIS — M810 Age-related osteoporosis without current pathological fracture: Secondary | ICD-10-CM | POA: Diagnosis not present

## 2022-10-13 DIAGNOSIS — M0579 Rheumatoid arthritis with rheumatoid factor of multiple sites without organ or systems involvement: Secondary | ICD-10-CM | POA: Diagnosis not present

## 2022-10-13 DIAGNOSIS — M1990 Unspecified osteoarthritis, unspecified site: Secondary | ICD-10-CM | POA: Diagnosis not present

## 2022-11-16 ENCOUNTER — Encounter: Payer: Self-pay | Admitting: Internal Medicine

## 2022-11-16 ENCOUNTER — Ambulatory Visit: Payer: Medicare Other | Attending: Internal Medicine | Admitting: Internal Medicine

## 2022-11-16 VITALS — BP 152/102 | HR 72 | Ht 64.5 in | Wt 127.0 lb

## 2022-11-16 DIAGNOSIS — I480 Paroxysmal atrial fibrillation: Secondary | ICD-10-CM | POA: Insufficient documentation

## 2022-11-16 NOTE — Patient Instructions (Signed)

## 2022-11-16 NOTE — Progress Notes (Signed)
HPI Felicia Hunter returns today for followup. She is a pleasant 82 yo woman with a h/o cryptogenic stroke who was found on her ILR to have PAF. She has been placed on systemic anti-coagulation and feels well. She has minimal if any palpitations. She notes class 2A dyspnea. She denies chest pain. She has not had syncope.  Allergies  Allergen Reactions   Sulfonamide Derivatives Rash   Sulfacetamide Sodium-Sulfur Rash     Current Outpatient Medications  Medication Sig Dispense Refill   acetaminophen (TYLENOL) 500 MG tablet Take 1,000 mg by mouth as needed for mild pain.     apixaban (ELIQUIS) 5 MG TABS tablet Take 1 tablet (5 mg total) by mouth 2 (two) times daily. 60 tablet 2   atorvastatin (LIPITOR) 20 MG tablet 1 tablet     BIOTIN PO Take 1 tablet by mouth daily.     Calcium Carb-Cholecalciferol (CALCIUM 600/VITAMIN D3 PO) Take 1 tablet by mouth daily.     cholecalciferol (VITAMIN D) 1000 UNITS tablet Take 1,000 Units by mouth daily.     Multiple Vitamin (MULTIVITAMIN WITH MINERALS) TABS tablet Take 1 tablet by mouth daily. 30 tablet    Omega-3 Fatty Acids (FISH OIL PO) Take 1 capsule by mouth daily.     polyvinyl alcohol (LIQUIFILM TEARS) 1.4 % ophthalmic solution Place 1 drop into both eyes as needed for dry eyes.      PREDNISONE PO Take 3 mg by mouth daily.     vitamin C (ASCORBIC ACID) 500 MG tablet Take 500 mg by mouth daily as needed (for cold symptoms).      zolpidem (AMBIEN) 10 MG tablet Take 10 mg by mouth at bedtime as needed for sleep.      No current facility-administered medications for this visit.     Past Medical History:  Diagnosis Date   Allergy    Alopecia    Arthritis    Atrial fibrillation ([facility])    Breast cancer ([facility])    double mastectomy 2011   Clavicle fracture    right clavicle-hardware retained   Clotting disorder ([facility])    with CVA- 2013, 2014   Cough    PERSISTANT COUGH X 20 YRS   Difficulty sleeping    takes Ambien   Disorders of bilirubin  excretion    Diverticulosis    Environmental and seasonal allergies    "dry cough", related to ? allergies   GERD (gastroesophageal reflux disease)    Hyperlipidemia    MVP (mitral valve prolapse)    Neuromuscular disorder ([facility])    Polypmyalgia  Pheumatica   Osteopenia    Osteoporosis    Stroke ([facility]) 06/07/11   x2 episodes-"funny feeling"" equilibrium off x24 hours, x1 speech affected- no residuallast 1 yr ago.   Tubulovillous adenoma of colon 08/2003    ROS:   All systems reviewed and negative except as noted in the HPI.   Past Surgical History:  Procedure Laterality Date   ABDOMINAL HYSTERECTOMY  1993   BREAST CAPSULOTOMY     after bilat mastectomies-implants removed   BREAST ENHANCEMENT SURGERY     x3-then removed 2011 for bilat mastectomies   BREAST SURGERY  june 2011   double mastectomy nodes from rt   CHEST WALL TUMOR EXCISION  5/12   noduals removed-neg   collarbone  september 2011   pinned and plated, right   COLONOSCOPY     IR ANGIO EXTRACRAN SEL COM CAROTID INNOMINATE UNI R MOD SED  04/24/2020  IR CT HEAD LTD  04/24/2020   IR PERCUTANEOUS ART THROMBECTOMY/INFUSION INTRACRANIAL INC DIAG ANGIO  04/24/2020   JOINT REPLACEMENT  july 9th, 2012   left knee replacement , Right knee replacement 09-13-17 Dr. Charlann Boxer   KNEE RECONSTRUCTION  09/2010   revision lt total knee   left   LOOP RECORDER IMPLANT N/A 11/07/2013   Procedure: LOOP RECORDER IMPLANT;  Surgeon: Marinus Maw, MD;  Location: [facility] CATH LAB;  Service: Cardiovascular;  Laterality: N/A;   LOOP RECORDER REMOVAL N/A 05/16/2017   Procedure: LOOP RECORDER REMOVAL;  Surgeon: Marinus Maw, MD;  Location: [facility];  Service: Cardiovascular;  Laterality: N/A;   MASS EXCISION Left 06/19/2012   Procedure: EXCISION OF LARGE MASS LEFT NECK/SHOULDER WITH PLASTIC CLOSURE;  Surgeon: Louisa Second, MD;  Location: [facility];  Service: Plastics;  Laterality: Left;   NASAL SEPTUM SURGERY     RADIOLOGY WITH  ANESTHESIA N/A 04/24/2020   Procedure: IR WITH ANESTHESIA;  Surgeon: Radiologist, Medication, MD;  Location: [facility];  Service: Radiology;  Laterality: N/A;   TEE WITHOUT CARDIOVERSION N/A 11/07/2013   Procedure: TRANSESOPHAGEAL ECHOCARDIOGRAM (TEE);  Surgeon: Lars Masson, MD;  Location[facility] ENDOSCOPY;  Service: Cardiovascular;  Laterality: N/A;   TONSILLECTOMY     TOTAL HIP ARTHROPLASTY Right 04/10/2013   Procedure: RIGHT TOTAL HIP ARTHROPLASTY ANTERIOR APPROACH;  Surgeon: Shelda Pal, MD;  Location[facility];  Service: Orthopedics;  Laterality: Right;   TOTAL HIP ARTHROPLASTY Left 12/31/2014   Procedure: LEFT TOTAL HIP ARTHROPLASTY ANTERIOR APPROACH;  Surgeon: Durene Romans, MD;  Location: [facility];  Service: Orthopedics;  Laterality: Left;   TOTAL KNEE ARTHROPLASTY Right 09/13/2017   Procedure: RIGHT TOTAL KNEE ARTHROPLASTY;  Surgeon: Durene Romans, MD;  Location: [facility];  Service: Orthopedics;  Laterality: Right;  70 mins   UPPER GASTROINTESTINAL ENDOSCOPY     VEIN SURGERY  1974   left leg     Family History  Problem Relation Age of Onset   Aneurysm Father    Colon cancer Neg Hx    Rectal cancer Neg Hx    Stomach cancer Neg Hx    Esophageal cancer Neg Hx      Social History   Socioeconomic History   Marital status: Widowed    Spouse name: Not on file   Number of children: 2   Years of education: college   Highest education level: Not on file  Occupational History   Occupation: retired    Associate Professor: Retired    Associate Professor: RETIRED  Tobacco Use   Smoking status: Never   Smokeless tobacco: Never  Vaping Use   Vaping status: Never Used  Substance and Sexual Activity   Alcohol use: Yes    Alcohol/week: 1.0 standard drink of alcohol    Types: 1 Glasses of wine per week    Comment: 1 glass of wine per night   Drug use: No   Sexual activity: Not Currently  Other Topics Concern   Not on file  Social History Narrative   Patient is single with 2 children.    Patient is left  handed.   Patient has college education.   Patient drinks coffee daily   Social Determinants of Health   Financial Resource Strain: Not on file  Food Insecurity: No Food Insecurity (05/01/2020)   Hunger Vital Sign    Worried About Running Out of Food in the Last Year: Never true    Ran Out of Food in the Last  Year: Never true  Transportation Needs: No Transportation Needs (05/01/2020)   PRAPARE - Administrator, Civil Service (Medical): No    Lack of Transportation (Non-Medical): No  Physical Activity: Not on file  Stress: Not on file  Social Connections: Not on file  Intimate Partner Violence: Not on file     BP (!) 152/102   Pulse 72   Ht 5' 4.5" (1.638 m)   Wt 127 lb (57.6 kg)   SpO2 98%   BMI 21.46 kg/m   Physical Exam:  Well appearing NAD HEENT: Unremarkable Neck:  No JVD, no thyromegally Lymphatics:  No adenopathy Back:  No CVA tenderness Lungs:  Clear with no wheezes HEART:  Regular rate rhythm, no murmurs, no rubs, no clicks Abd:  soft, positive bowel sounds, no organomegally, no rebound, no guarding Ext:  2 plus pulses, no edema, no cyanosis, no clubbing Skin:  No rashes no nodules Neuro:  CN II through XII intact, motor grossly intact  EKG - nsr   Assess/Plan: PAF - she is asymptomatic. She will continue eliquis. Dyspnea with exertion - she remains acitve but has reduce her acitivity a little, mostly walking up inclines. She is still able to do water aerobics. I encouraged her to continue but to let us know if her symptoms worsen.   Sharlot Gowda Maks Cavallero,MD

## 2022-11-17 DIAGNOSIS — Z23 Encounter for immunization: Secondary | ICD-10-CM | POA: Diagnosis not present

## 2022-12-04 ENCOUNTER — Encounter (HOSPITAL_COMMUNITY): Payer: Self-pay

## 2022-12-23 DIAGNOSIS — Z23 Encounter for immunization: Secondary | ICD-10-CM | POA: Diagnosis not present

## 2023-01-12 DIAGNOSIS — M25511 Pain in right shoulder: Secondary | ICD-10-CM | POA: Diagnosis not present

## 2023-01-12 DIAGNOSIS — M0579 Rheumatoid arthritis with rheumatoid factor of multiple sites without organ or systems involvement: Secondary | ICD-10-CM | POA: Diagnosis not present

## 2023-01-12 DIAGNOSIS — M1990 Unspecified osteoarthritis, unspecified site: Secondary | ICD-10-CM | POA: Diagnosis not present

## 2023-01-12 DIAGNOSIS — Z79899 Other long term (current) drug therapy: Secondary | ICD-10-CM | POA: Diagnosis not present

## 2023-01-12 DIAGNOSIS — M810 Age-related osteoporosis without current pathological fracture: Secondary | ICD-10-CM | POA: Diagnosis not present

## 2023-01-18 DIAGNOSIS — M810 Age-related osteoporosis without current pathological fracture: Secondary | ICD-10-CM | POA: Diagnosis not present

## 2023-03-29 DIAGNOSIS — M810 Age-related osteoporosis without current pathological fracture: Secondary | ICD-10-CM | POA: Diagnosis not present

## 2023-03-29 DIAGNOSIS — E7800 Pure hypercholesterolemia, unspecified: Secondary | ICD-10-CM | POA: Diagnosis not present

## 2023-04-14 DIAGNOSIS — Z79899 Other long term (current) drug therapy: Secondary | ICD-10-CM | POA: Diagnosis not present

## 2023-04-14 DIAGNOSIS — M25511 Pain in right shoulder: Secondary | ICD-10-CM | POA: Diagnosis not present

## 2023-04-14 DIAGNOSIS — M810 Age-related osteoporosis without current pathological fracture: Secondary | ICD-10-CM | POA: Diagnosis not present

## 2023-04-14 DIAGNOSIS — M1990 Unspecified osteoarthritis, unspecified site: Secondary | ICD-10-CM | POA: Diagnosis not present

## 2023-04-14 DIAGNOSIS — M0579 Rheumatoid arthritis with rheumatoid factor of multiple sites without organ or systems involvement: Secondary | ICD-10-CM | POA: Diagnosis not present

## 2023-04-22 DIAGNOSIS — Z0000 Encounter for general adult medical examination without abnormal findings: Secondary | ICD-10-CM | POA: Diagnosis not present

## 2023-04-22 DIAGNOSIS — E7800 Pure hypercholesterolemia, unspecified: Secondary | ICD-10-CM | POA: Diagnosis not present

## 2023-04-22 DIAGNOSIS — I8390 Asymptomatic varicose veins of unspecified lower extremity: Secondary | ICD-10-CM | POA: Diagnosis not present

## 2023-04-22 DIAGNOSIS — Z8673 Personal history of transient ischemic attack (TIA), and cerebral infarction without residual deficits: Secondary | ICD-10-CM | POA: Diagnosis not present

## 2023-04-22 DIAGNOSIS — M79601 Pain in right arm: Secondary | ICD-10-CM | POA: Diagnosis not present

## 2023-04-22 DIAGNOSIS — K219 Gastro-esophageal reflux disease without esophagitis: Secondary | ICD-10-CM | POA: Diagnosis not present

## 2023-04-22 DIAGNOSIS — I480 Paroxysmal atrial fibrillation: Secondary | ICD-10-CM | POA: Diagnosis not present

## 2023-04-22 DIAGNOSIS — M810 Age-related osteoporosis without current pathological fracture: Secondary | ICD-10-CM | POA: Diagnosis not present

## 2023-04-22 DIAGNOSIS — I700 Atherosclerosis of aorta: Secondary | ICD-10-CM | POA: Diagnosis not present

## 2023-04-22 DIAGNOSIS — M159 Polyosteoarthritis, unspecified: Secondary | ICD-10-CM | POA: Diagnosis not present

## 2023-04-22 DIAGNOSIS — M0579 Rheumatoid arthritis with rheumatoid factor of multiple sites without organ or systems involvement: Secondary | ICD-10-CM | POA: Diagnosis not present

## 2023-05-02 DIAGNOSIS — M19011 Primary osteoarthritis, right shoulder: Secondary | ICD-10-CM | POA: Diagnosis not present

## 2023-06-15 DIAGNOSIS — R350 Frequency of micturition: Secondary | ICD-10-CM | POA: Diagnosis not present

## 2023-06-15 DIAGNOSIS — M545 Low back pain, unspecified: Secondary | ICD-10-CM | POA: Diagnosis not present

## 2023-07-12 DIAGNOSIS — M0579 Rheumatoid arthritis with rheumatoid factor of multiple sites without organ or systems involvement: Secondary | ICD-10-CM | POA: Diagnosis not present

## 2023-07-12 DIAGNOSIS — M25511 Pain in right shoulder: Secondary | ICD-10-CM | POA: Diagnosis not present

## 2023-07-12 DIAGNOSIS — Z79899 Other long term (current) drug therapy: Secondary | ICD-10-CM | POA: Diagnosis not present

## 2023-07-12 DIAGNOSIS — M549 Dorsalgia, unspecified: Secondary | ICD-10-CM | POA: Diagnosis not present

## 2023-07-12 DIAGNOSIS — M810 Age-related osteoporosis without current pathological fracture: Secondary | ICD-10-CM | POA: Diagnosis not present

## 2023-07-12 DIAGNOSIS — M1990 Unspecified osteoarthritis, unspecified site: Secondary | ICD-10-CM | POA: Diagnosis not present

## 2023-07-19 DIAGNOSIS — M810 Age-related osteoporosis without current pathological fracture: Secondary | ICD-10-CM | POA: Diagnosis not present

## 2023-08-11 DIAGNOSIS — R32 Unspecified urinary incontinence: Secondary | ICD-10-CM | POA: Diagnosis not present

## 2023-08-11 DIAGNOSIS — M0579 Rheumatoid arthritis with rheumatoid factor of multiple sites without organ or systems involvement: Secondary | ICD-10-CM | POA: Diagnosis not present

## 2023-08-11 DIAGNOSIS — Z79899 Other long term (current) drug therapy: Secondary | ICD-10-CM | POA: Diagnosis not present

## 2023-08-11 DIAGNOSIS — N3001 Acute cystitis with hematuria: Secondary | ICD-10-CM | POA: Diagnosis not present

## 2023-10-12 DIAGNOSIS — M1990 Unspecified osteoarthritis, unspecified site: Secondary | ICD-10-CM | POA: Diagnosis not present

## 2023-10-12 DIAGNOSIS — M25511 Pain in right shoulder: Secondary | ICD-10-CM | POA: Diagnosis not present

## 2023-10-12 DIAGNOSIS — Z79899 Other long term (current) drug therapy: Secondary | ICD-10-CM | POA: Diagnosis not present

## 2023-10-12 DIAGNOSIS — M0579 Rheumatoid arthritis with rheumatoid factor of multiple sites without organ or systems involvement: Secondary | ICD-10-CM | POA: Diagnosis not present

## 2023-10-12 DIAGNOSIS — M810 Age-related osteoporosis without current pathological fracture: Secondary | ICD-10-CM | POA: Diagnosis not present

## 2023-11-22 DIAGNOSIS — Z23 Encounter for immunization: Secondary | ICD-10-CM | POA: Diagnosis not present

## 2023-12-06 DIAGNOSIS — H524 Presbyopia: Secondary | ICD-10-CM | POA: Diagnosis not present

## 2023-12-06 DIAGNOSIS — H04123 Dry eye syndrome of bilateral lacrimal glands: Secondary | ICD-10-CM | POA: Diagnosis not present

## 2023-12-06 DIAGNOSIS — H2513 Age-related nuclear cataract, bilateral: Secondary | ICD-10-CM | POA: Diagnosis not present

## 2023-12-07 DIAGNOSIS — Z23 Encounter for immunization: Secondary | ICD-10-CM | POA: Diagnosis not present

## 2024-01-12 DIAGNOSIS — M25511 Pain in right shoulder: Secondary | ICD-10-CM | POA: Diagnosis not present

## 2024-01-12 DIAGNOSIS — M810 Age-related osteoporosis without current pathological fracture: Secondary | ICD-10-CM | POA: Diagnosis not present

## 2024-01-12 DIAGNOSIS — Z79899 Other long term (current) drug therapy: Secondary | ICD-10-CM | POA: Diagnosis not present

## 2024-01-12 DIAGNOSIS — M0579 Rheumatoid arthritis with rheumatoid factor of multiple sites without organ or systems involvement: Secondary | ICD-10-CM | POA: Diagnosis not present

## 2024-01-12 DIAGNOSIS — M1990 Unspecified osteoarthritis, unspecified site: Secondary | ICD-10-CM | POA: Diagnosis not present

## 2024-01-20 DIAGNOSIS — M810 Age-related osteoporosis without current pathological fracture: Secondary | ICD-10-CM | POA: Diagnosis not present

## 2024-03-06 ENCOUNTER — Other Ambulatory Visit: Payer: Self-pay
